# Patient Record
Sex: Female | Born: 1935 | Race: White | Hispanic: No | Smoking: Never smoker
Health system: Southern US, Community
[De-identification: ages and names within clinical notes are randomized; demographics above are authoritative.]

## PROBLEM LIST (undated history)

## (undated) DIAGNOSIS — T7840XA Allergy, unspecified, initial encounter: Secondary | ICD-10-CM

## (undated) DIAGNOSIS — Z951 Presence of aortocoronary bypass graft: Secondary | ICD-10-CM

## (undated) DIAGNOSIS — E785 Hyperlipidemia, unspecified: Secondary | ICD-10-CM

## (undated) DIAGNOSIS — I739 Peripheral vascular disease, unspecified: Secondary | ICD-10-CM

## (undated) DIAGNOSIS — Z789 Other specified health status: Secondary | ICD-10-CM

## (undated) DIAGNOSIS — G629 Polyneuropathy, unspecified: Secondary | ICD-10-CM

## (undated) DIAGNOSIS — I779 Disorder of arteries and arterioles, unspecified: Secondary | ICD-10-CM

## (undated) DIAGNOSIS — I255 Ischemic cardiomyopathy: Secondary | ICD-10-CM

## (undated) DIAGNOSIS — I712 Thoracic aortic aneurysm, without rupture: Secondary | ICD-10-CM

## (undated) DIAGNOSIS — I2510 Atherosclerotic heart disease of native coronary artery without angina pectoris: Secondary | ICD-10-CM

## (undated) DIAGNOSIS — R55 Syncope and collapse: Secondary | ICD-10-CM

## (undated) DIAGNOSIS — Z9889 Other specified postprocedural states: Secondary | ICD-10-CM

## (undated) DIAGNOSIS — I480 Paroxysmal atrial fibrillation: Secondary | ICD-10-CM

## (undated) DIAGNOSIS — F329 Major depressive disorder, single episode, unspecified: Secondary | ICD-10-CM

## (undated) DIAGNOSIS — E781 Pure hyperglyceridemia: Secondary | ICD-10-CM

## (undated) DIAGNOSIS — I10 Essential (primary) hypertension: Secondary | ICD-10-CM

## (undated) DIAGNOSIS — K3184 Gastroparesis: Secondary | ICD-10-CM

## (undated) DIAGNOSIS — Z91041 Radiographic dye allergy status: Secondary | ICD-10-CM

## (undated) DIAGNOSIS — I4729 Other ventricular tachycardia: Secondary | ICD-10-CM

## (undated) DIAGNOSIS — F32A Depression, unspecified: Secondary | ICD-10-CM

## (undated) DIAGNOSIS — I959 Hypotension, unspecified: Secondary | ICD-10-CM

## (undated) DIAGNOSIS — E039 Hypothyroidism, unspecified: Secondary | ICD-10-CM

## (undated) DIAGNOSIS — N189 Chronic kidney disease, unspecified: Secondary | ICD-10-CM

## (undated) DIAGNOSIS — I5022 Chronic systolic (congestive) heart failure: Secondary | ICD-10-CM

## (undated) DIAGNOSIS — I472 Ventricular tachycardia: Secondary | ICD-10-CM

## (undated) HISTORY — PX: OTHER SURGICAL HISTORY: SHX169

## (undated) HISTORY — DX: Hypotension, unspecified: I95.9

## (undated) HISTORY — PX: CARDIAC CATHETERIZATION: SHX172

## (undated) HISTORY — DX: Other specified postprocedural states: Z98.890

## (undated) HISTORY — PX: SALIVARY GLAND SURGERY: SHX768

## (undated) HISTORY — DX: Presence of aortocoronary bypass graft: Z95.1

## (undated) HISTORY — DX: Chronic kidney disease, unspecified: N18.9

## (undated) HISTORY — DX: Gastroparesis: K31.84

## (undated) HISTORY — DX: Major depressive disorder, single episode, unspecified: F32.9

## (undated) HISTORY — DX: Peripheral vascular disease, unspecified: I73.9

## (undated) HISTORY — DX: Essential (primary) hypertension: I10

## (undated) HISTORY — PX: BACK SURGERY: SHX140

## (undated) HISTORY — DX: Radiographic dye allergy status: Z91.041

## (undated) HISTORY — DX: Depression, unspecified: F32.A

## (undated) HISTORY — DX: Syncope and collapse: R55

## (undated) HISTORY — DX: Atherosclerotic heart disease of native coronary artery without angina pectoris: I25.10

## (undated) HISTORY — DX: Disorder of arteries and arterioles, unspecified: I77.9

## (undated) HISTORY — PX: EXTERNAL EAR SURGERY: SHX627

## (undated) HISTORY — PX: BREAST SURGERY: SHX581

## (undated) HISTORY — DX: Other specified health status: Z78.9

## (undated) HISTORY — DX: Hyperlipidemia, unspecified: E78.5

## (undated) HISTORY — PX: COLON RESECTION: SHX5231

## (undated) HISTORY — PX: TOTAL ABDOMINAL HYSTERECTOMY: SHX209

---

## 1998-09-12 ENCOUNTER — Encounter: Payer: Self-pay | Admitting: Neurosurgery

## 1998-09-12 ENCOUNTER — Ambulatory Visit (HOSPITAL_COMMUNITY): Admission: RE | Admit: 1998-09-12 | Discharge: 1998-09-12 | Payer: Self-pay | Admitting: Neurosurgery

## 1999-02-12 ENCOUNTER — Encounter: Payer: Self-pay | Admitting: Emergency Medicine

## 1999-02-12 ENCOUNTER — Inpatient Hospital Stay (HOSPITAL_COMMUNITY): Admission: EM | Admit: 1999-02-12 | Discharge: 1999-02-15 | Payer: Self-pay | Admitting: Emergency Medicine

## 1999-08-28 ENCOUNTER — Encounter: Admission: RE | Admit: 1999-08-28 | Discharge: 1999-08-28 | Payer: Self-pay | Admitting: Family Medicine

## 1999-08-28 ENCOUNTER — Encounter: Payer: Self-pay | Admitting: Family Medicine

## 1999-10-16 ENCOUNTER — Ambulatory Visit (HOSPITAL_BASED_OUTPATIENT_CLINIC_OR_DEPARTMENT_OTHER): Admission: RE | Admit: 1999-10-16 | Discharge: 1999-10-16 | Payer: Self-pay | Admitting: Family Medicine

## 1999-11-12 ENCOUNTER — Encounter: Payer: Self-pay | Admitting: Cardiology

## 1999-11-12 ENCOUNTER — Ambulatory Visit (HOSPITAL_COMMUNITY): Admission: RE | Admit: 1999-11-12 | Discharge: 1999-11-13 | Payer: Self-pay | Admitting: Cardiology

## 2000-02-18 ENCOUNTER — Ambulatory Visit (HOSPITAL_COMMUNITY): Admission: RE | Admit: 2000-02-18 | Discharge: 2000-02-19 | Payer: Self-pay | Admitting: Cardiology

## 2000-02-24 ENCOUNTER — Ambulatory Visit (HOSPITAL_BASED_OUTPATIENT_CLINIC_OR_DEPARTMENT_OTHER): Admission: RE | Admit: 2000-02-24 | Discharge: 2000-02-24 | Payer: Self-pay | Admitting: Family Medicine

## 2000-08-07 ENCOUNTER — Emergency Department (HOSPITAL_COMMUNITY): Admission: EM | Admit: 2000-08-07 | Discharge: 2000-08-07 | Payer: Self-pay | Admitting: Emergency Medicine

## 2000-08-07 ENCOUNTER — Encounter: Payer: Self-pay | Admitting: Internal Medicine

## 2000-08-11 ENCOUNTER — Inpatient Hospital Stay (HOSPITAL_COMMUNITY): Admission: AD | Admit: 2000-08-11 | Discharge: 2000-08-15 | Payer: Self-pay | Admitting: *Deleted

## 2000-09-06 ENCOUNTER — Encounter: Payer: Self-pay | Admitting: Neurosurgery

## 2000-09-06 ENCOUNTER — Ambulatory Visit (HOSPITAL_COMMUNITY): Admission: RE | Admit: 2000-09-06 | Discharge: 2000-09-06 | Payer: Self-pay | Admitting: Neurosurgery

## 2000-09-09 ENCOUNTER — Inpatient Hospital Stay (HOSPITAL_COMMUNITY): Admission: EM | Admit: 2000-09-09 | Discharge: 2000-09-11 | Payer: Self-pay | Admitting: Emergency Medicine

## 2000-11-11 ENCOUNTER — Encounter: Payer: Self-pay | Admitting: Internal Medicine

## 2000-11-11 ENCOUNTER — Inpatient Hospital Stay (HOSPITAL_COMMUNITY): Admission: EM | Admit: 2000-11-11 | Discharge: 2000-11-18 | Payer: Self-pay | Admitting: Emergency Medicine

## 2000-11-28 ENCOUNTER — Emergency Department (HOSPITAL_COMMUNITY): Admission: EM | Admit: 2000-11-28 | Discharge: 2000-11-28 | Payer: Self-pay | Admitting: Emergency Medicine

## 2001-01-20 DIAGNOSIS — Z951 Presence of aortocoronary bypass graft: Secondary | ICD-10-CM

## 2001-01-20 DIAGNOSIS — I7121 Aneurysm of the ascending aorta, without rupture: Secondary | ICD-10-CM

## 2001-01-20 HISTORY — DX: Presence of aortocoronary bypass graft: Z95.1

## 2001-01-20 HISTORY — DX: Aneurysm of the ascending aorta, without rupture: I71.21

## 2001-03-04 ENCOUNTER — Inpatient Hospital Stay (HOSPITAL_COMMUNITY): Admission: AD | Admit: 2001-03-04 | Discharge: 2001-03-06 | Payer: Self-pay | Admitting: Cardiovascular Disease

## 2001-03-27 ENCOUNTER — Inpatient Hospital Stay (HOSPITAL_COMMUNITY): Admission: EM | Admit: 2001-03-27 | Discharge: 2001-04-05 | Payer: Self-pay | Admitting: Cardiology

## 2001-03-29 ENCOUNTER — Encounter: Payer: Self-pay | Admitting: Cardiology

## 2001-04-05 ENCOUNTER — Encounter: Payer: Self-pay | Admitting: Internal Medicine

## 2001-04-25 ENCOUNTER — Inpatient Hospital Stay (HOSPITAL_COMMUNITY): Admission: EM | Admit: 2001-04-25 | Discharge: 2001-05-17 | Payer: Self-pay

## 2001-04-25 ENCOUNTER — Encounter (INDEPENDENT_AMBULATORY_CARE_PROVIDER_SITE_OTHER): Payer: Self-pay | Admitting: Specialist

## 2001-05-04 ENCOUNTER — Encounter: Payer: Self-pay | Admitting: Cardiology

## 2001-05-05 ENCOUNTER — Encounter: Payer: Self-pay | Admitting: Cardiothoracic Surgery

## 2001-05-05 HISTORY — PX: CORONARY ARTERY BYPASS GRAFT: SHX141

## 2001-05-06 ENCOUNTER — Encounter: Payer: Self-pay | Admitting: Cardiothoracic Surgery

## 2001-05-07 ENCOUNTER — Encounter: Payer: Self-pay | Admitting: Cardiothoracic Surgery

## 2001-05-08 ENCOUNTER — Encounter: Payer: Self-pay | Admitting: Cardiothoracic Surgery

## 2001-05-10 ENCOUNTER — Encounter: Payer: Self-pay | Admitting: Thoracic Surgery (Cardiothoracic Vascular Surgery)

## 2001-11-17 ENCOUNTER — Encounter: Payer: Self-pay | Admitting: Cardiology

## 2001-11-17 ENCOUNTER — Encounter: Payer: Self-pay | Admitting: Emergency Medicine

## 2001-11-17 ENCOUNTER — Inpatient Hospital Stay (HOSPITAL_COMMUNITY): Admission: EM | Admit: 2001-11-17 | Discharge: 2001-11-21 | Payer: Self-pay | Admitting: Emergency Medicine

## 2002-01-04 ENCOUNTER — Inpatient Hospital Stay (HOSPITAL_COMMUNITY): Admission: EM | Admit: 2002-01-04 | Discharge: 2002-01-07 | Payer: Self-pay | Admitting: Emergency Medicine

## 2002-04-09 ENCOUNTER — Encounter: Payer: Self-pay | Admitting: Emergency Medicine

## 2002-04-09 ENCOUNTER — Inpatient Hospital Stay (HOSPITAL_COMMUNITY): Admission: EM | Admit: 2002-04-09 | Discharge: 2002-04-15 | Payer: Self-pay | Admitting: Emergency Medicine

## 2002-04-14 ENCOUNTER — Encounter: Payer: Self-pay | Admitting: Cardiology

## 2003-04-23 ENCOUNTER — Inpatient Hospital Stay (HOSPITAL_COMMUNITY): Admission: EM | Admit: 2003-04-23 | Discharge: 2003-04-27 | Payer: Self-pay | Admitting: Emergency Medicine

## 2004-01-11 ENCOUNTER — Ambulatory Visit: Payer: Self-pay | Admitting: Cardiology

## 2004-03-01 ENCOUNTER — Ambulatory Visit: Payer: Self-pay | Admitting: Cardiology

## 2004-03-01 ENCOUNTER — Inpatient Hospital Stay (HOSPITAL_COMMUNITY): Admission: EM | Admit: 2004-03-01 | Discharge: 2004-03-05 | Payer: Self-pay | Admitting: Emergency Medicine

## 2004-03-18 ENCOUNTER — Ambulatory Visit: Payer: Self-pay | Admitting: Cardiology

## 2004-08-05 ENCOUNTER — Ambulatory Visit: Payer: Self-pay | Admitting: Cardiology

## 2005-02-09 ENCOUNTER — Ambulatory Visit: Payer: Self-pay | Admitting: Cardiology

## 2005-02-10 ENCOUNTER — Inpatient Hospital Stay (HOSPITAL_COMMUNITY): Admission: EM | Admit: 2005-02-10 | Discharge: 2005-02-14 | Payer: Self-pay | Admitting: Emergency Medicine

## 2005-02-13 ENCOUNTER — Ambulatory Visit: Payer: Self-pay | Admitting: Gastroenterology

## 2005-02-18 ENCOUNTER — Ambulatory Visit (HOSPITAL_COMMUNITY): Admission: RE | Admit: 2005-02-18 | Discharge: 2005-02-18 | Payer: Self-pay | Admitting: *Deleted

## 2005-08-01 ENCOUNTER — Emergency Department (HOSPITAL_COMMUNITY): Admission: EM | Admit: 2005-08-01 | Discharge: 2005-08-01 | Payer: Self-pay | Admitting: Emergency Medicine

## 2006-01-20 DIAGNOSIS — R55 Syncope and collapse: Secondary | ICD-10-CM

## 2006-01-20 HISTORY — DX: Syncope and collapse: R55

## 2006-02-19 ENCOUNTER — Ambulatory Visit: Payer: Self-pay | Admitting: Cardiology

## 2006-02-19 ENCOUNTER — Encounter: Payer: Self-pay | Admitting: Cardiology

## 2006-02-19 ENCOUNTER — Inpatient Hospital Stay (HOSPITAL_COMMUNITY): Admission: EM | Admit: 2006-02-19 | Discharge: 2006-02-26 | Payer: Self-pay | Admitting: Emergency Medicine

## 2006-02-20 ENCOUNTER — Encounter: Payer: Self-pay | Admitting: Cardiology

## 2006-06-05 ENCOUNTER — Ambulatory Visit: Payer: Self-pay | Admitting: Cardiology

## 2006-06-05 ENCOUNTER — Encounter: Payer: Self-pay | Admitting: Cardiology

## 2006-07-10 ENCOUNTER — Ambulatory Visit: Payer: Self-pay | Admitting: *Deleted

## 2006-07-19 ENCOUNTER — Encounter: Payer: Self-pay | Admitting: Cardiology

## 2006-08-05 ENCOUNTER — Encounter: Payer: Self-pay | Admitting: Cardiology

## 2006-08-05 ENCOUNTER — Ambulatory Visit: Payer: Self-pay | Admitting: Cardiology

## 2006-08-14 ENCOUNTER — Encounter: Payer: Self-pay | Admitting: Cardiology

## 2006-08-14 ENCOUNTER — Ambulatory Visit: Payer: Self-pay | Admitting: Cardiology

## 2006-09-03 ENCOUNTER — Ambulatory Visit: Payer: Self-pay | Admitting: Cardiology

## 2007-05-23 ENCOUNTER — Encounter: Payer: Self-pay | Admitting: Cardiology

## 2007-05-24 ENCOUNTER — Encounter: Payer: Self-pay | Admitting: Cardiology

## 2007-07-07 ENCOUNTER — Encounter: Payer: Self-pay | Admitting: Cardiology

## 2007-07-08 ENCOUNTER — Encounter: Payer: Self-pay | Admitting: Cardiology

## 2007-09-03 ENCOUNTER — Encounter: Payer: Self-pay | Admitting: Cardiology

## 2007-09-09 ENCOUNTER — Ambulatory Visit: Payer: Self-pay | Admitting: Cardiology

## 2007-10-10 ENCOUNTER — Encounter: Payer: Self-pay | Admitting: Cardiology

## 2008-06-26 ENCOUNTER — Encounter: Payer: Self-pay | Admitting: Cardiology

## 2008-08-04 ENCOUNTER — Encounter: Payer: Self-pay | Admitting: Cardiology

## 2008-09-06 ENCOUNTER — Encounter: Payer: Self-pay | Admitting: Cardiology

## 2008-09-06 ENCOUNTER — Ambulatory Visit: Payer: Self-pay | Admitting: Cardiology

## 2008-09-07 ENCOUNTER — Encounter: Payer: Self-pay | Admitting: Cardiology

## 2008-09-29 ENCOUNTER — Telehealth (INDEPENDENT_AMBULATORY_CARE_PROVIDER_SITE_OTHER): Payer: Self-pay | Admitting: *Deleted

## 2009-01-16 ENCOUNTER — Encounter: Payer: Self-pay | Admitting: Cardiology

## 2009-01-23 ENCOUNTER — Inpatient Hospital Stay (HOSPITAL_COMMUNITY): Admission: EM | Admit: 2009-01-23 | Discharge: 2009-01-24 | Payer: Self-pay | Admitting: Emergency Medicine

## 2009-01-24 ENCOUNTER — Encounter: Payer: Self-pay | Admitting: Cardiology

## 2009-01-24 ENCOUNTER — Ambulatory Visit: Payer: Self-pay | Admitting: Cardiology

## 2009-01-29 ENCOUNTER — Encounter: Payer: Self-pay | Admitting: Cardiology

## 2009-02-01 ENCOUNTER — Encounter: Payer: Self-pay | Admitting: Cardiology

## 2009-02-05 ENCOUNTER — Encounter: Payer: Self-pay | Admitting: Cardiology

## 2009-03-14 ENCOUNTER — Encounter: Payer: Self-pay | Admitting: Cardiology

## 2009-03-15 ENCOUNTER — Ambulatory Visit: Payer: Self-pay | Admitting: Cardiology

## 2009-08-06 ENCOUNTER — Telehealth (INDEPENDENT_AMBULATORY_CARE_PROVIDER_SITE_OTHER): Payer: Self-pay | Admitting: *Deleted

## 2009-08-19 ENCOUNTER — Ambulatory Visit: Payer: Self-pay | Admitting: Internal Medicine

## 2009-08-19 ENCOUNTER — Encounter: Payer: Self-pay | Admitting: Cardiology

## 2009-08-20 ENCOUNTER — Inpatient Hospital Stay (HOSPITAL_COMMUNITY): Admission: EM | Admit: 2009-08-20 | Discharge: 2009-08-23 | Payer: Self-pay | Admitting: Emergency Medicine

## 2009-08-20 ENCOUNTER — Encounter: Payer: Self-pay | Admitting: Cardiology

## 2009-08-21 ENCOUNTER — Encounter: Payer: Self-pay | Admitting: Cardiology

## 2009-08-21 DIAGNOSIS — I2510 Atherosclerotic heart disease of native coronary artery without angina pectoris: Secondary | ICD-10-CM | POA: Insufficient documentation

## 2009-08-22 ENCOUNTER — Encounter: Payer: Self-pay | Admitting: Cardiology

## 2009-08-23 ENCOUNTER — Encounter: Payer: Self-pay | Admitting: Cardiology

## 2009-08-27 ENCOUNTER — Ambulatory Visit: Payer: Self-pay | Admitting: Cardiology

## 2009-12-19 ENCOUNTER — Telehealth (INDEPENDENT_AMBULATORY_CARE_PROVIDER_SITE_OTHER): Payer: Self-pay | Admitting: *Deleted

## 2010-02-21 NOTE — Assessment & Plan Note (Signed)
Summary: 6 MO F/U PER REMINDER-DR. KATZ PT-  Medications Added ISOSORBIDE MONONITRATE CR 30 MG XR24H-TAB (ISOSORBIDE MONONITRATE) take 2 tabs (60mg ) daily VITAMIN B-12 500 MCG TABS (CYANOCOBALAMIN) Take 1 tablet by mouth once a day FOLIC ACID 1 MG TABS (FOLIC ACID) Take 1 tablet by mouth once a day MELATONIN 5 MG TABS (MELATONIN) take 2 tabs at bedtime STOOL SOFTENER 100 MG CAPS (DOCUSATE SODIUM) 2 tabs daily as needed AMARYL 2 MG TABS (GLIMEPIRIDE) Take 1 tab by mouth at bedtime ATENOLOL 100 MG TABS (ATENOLOL) Take 1 tablet by mouth once a day METFORMIN HCL 500 MG TABS (METFORMIN HCL) Take 1 tablet by mouth two times a day        Visit Type:  Follow-up Primary Provider:  Isac Sarna, MD  CC:  CAD.  History of Present Illness: The patient presents for followup after recent cardiac catheterization and admission for chest pain. She was found to have coronary disease as described below. She status post bypass grafting in stenting in the past. Her anatomy was felt to be stable and she was managed medically. Since catheterization she has had a couple of episodes of chest discomfort but these apparently have been brief not requiring nitroglycerin. She is very limited in her activities with apparent generalized weakness and also balance problems related to vertigo. She has not had any severe chest pressure, neck or arm discomfort. She is not describing new palpitations, presyncope or syncope. She doesn't describe shortness of breath, PND or orthopnea.  Preventive Screening-Counseling & Management  Alcohol-Tobacco     Smoking Status: never  Current Medications (verified): 1)  Isosorbide Mononitrate Cr 30 Mg Xr24h-Tab (Isosorbide Mononitrate) .... Take 2 Tabs (60mg ) Daily 2)  Plavix 75 Mg Tabs (Clopidogrel Bisulfate) .... Take One Tablet By Mouth Daily 3)  Gemfibrozil 600 Mg Tabs (Gemfibrozil) .... Take One Tablet By Mouth Once Daily 4)  Multivitamins   Tabs (Multiple Vitamin) .... Once  Daily 5)  Nitroglycerin 0.4 Mg Subl (Nitroglycerin) .... One Tablet Under Tongue Every 5 Minutes As Needed For Chest Pain---May Repeat Times Three 6)  Levothyroxine Sodium 112 Mcg Tabs (Levothyroxine Sodium) .... 1/2 Tab Daily 7)  Lantus 100 Unit/ml Soln (Insulin Glargine) .... 90 Units Two Times A Day 8)  Ramipril 5 Mg Caps (Ramipril) .... Take 1 Tablet By Mouth Once A Day 9)  Humalog 100 Unit/ml Soln (Insulin Lispro (Human)) .... Ss Once Daily 10)  Citalopram Hydrobromide 20 Mg Tabs (Citalopram Hydrobromide) .... Take 1 Tablet By Mouth Once A Day 11)  Vitamin B-12 500 Mcg Tabs (Cyanocobalamin) .... Take 1 Tablet By Mouth Once A Day 12)  Aspirin 325 Mg Tabs (Aspirin) .... Take 1 Tablet By Mouth Once A Day 13)  Folic Acid 1 Mg Tabs (Folic Acid) .... Take 1 Tablet By Mouth Once A Day 14)  Tums 500 Mg Chew (Calcium Carbonate Antacid) .... As Needed 15)  One Touch Ultra Test .... Once Daily 16)  Amlodipine Besylate 5 Mg Tabs (Amlodipine Besylate) .... Take 1 Tablet By Mouth Once A Day 17)  Melatonin 5 Mg Tabs (Melatonin) .... Take 2 Tabs At Bedtime 18)  Meclizine Hcl 25 Mg Tabs (Meclizine Hcl) .... As Needed 19)  Gabapentin 300 Mg Caps (Gabapentin) .... Take 1 Tab By Mouth At Bedtime 20)  Tylenol Arthritis Pain 650 Mg Cr-Tabs (Acetaminophen) .... As Needed 21)  Stool Softener 100 Mg Caps (Docusate Sodium) .... 2 Tabs Daily As Needed 22)  Amaryl 2 Mg Tabs (Glimepiride) .... Take 1 Tab By  Mouth At Bedtime 23)  Atenolol 100 Mg Tabs (Atenolol) .... Take 1 Tablet By Mouth Once A Day 24)  Metformin Hcl 500 Mg Tabs (Metformin Hcl) .... Take 1 Tablet By Mouth Two Times A Day  Allergies: 1)  ! Lipitor 2)  ! Iodine 3)  ! * Ambien 4)  ! * Nitrofurantoin  Past History:  Past Medical History: HYPERTENSION, UNSPECIFIED (ICD-401.9) HYPERLIPIDEMIA-MIXED (ICD-272.4) CAD...(catheterization 08/21/2009, LAD distal diffuse stenosis, circumflex occluded fills via collaterals, LOM with patent stent followed by  nonobstructive plaque, right coronary artery occluded, saphenous vein graft to the right coronary artery patent, LIMA to the LAD patent. SVG to OM occluded. EF preserved.) CABG 2003... LIMA.. LAD, SVG to the OM, SVG right coronary artery Ascending thoracic aneurysm repair with a graft at time of CABG diabetes........ insulin-dependent Gastroparesis Depression Contrast dye allergy Ejection fraction 50% Back surgery Bladder tack Hysterectomy Breast surgery Syncope.Marland KitchenMarland Kitchen??? question 2008 Statin... intolerance...  Review of Systems       As stated in the HPI and negative for all other systems.   Vital Signs:  Patient profile:   77 year old female Height:      65 inches Weight:      197.50 pounds BMI:     32.98 Pulse rate:   80 / minute BP sitting:   106 / 67  (left arm) Cuff size:   regular  Vitals Entered By: Hoover Brunette, LPN (August 27, 2009 3:01 PM)  Nutrition Counseling: Patient's BMI is greater than 25 and therefore counseled on weight management options. CC: CAD Is Patient Diabetic? Yes Comments post hosp & 6 mo. f/u   Physical Exam  General:  Frail appearing with poor hygiene Head:  normocephalic and atraumatic Eyes:  PERRLA/EOM intact; conjunctiva and lids normal. Mouth:  Teeth, gums and palate normal. Oral mucosa normal. Neck:  Neck supple, no JVD. No masses, thyromegaly or abnormal cervical nodes. Chest Wall:  Well-healed sternotomy scar Lungs:  Clear bilaterally to auscultation and percussion. Heart:  S1 and S2 within normal limits, no S3, no S4, no clicks, no rubs, no murmurs Abdomen:  Bowel sounds positive; abdomen soft and non-tender without masses, organomegaly, or hernias noted. No hepatosplenomegaly. Msk:  mild diffuse muscle weakness Extremities:  mildly diminished pulses in the left lower extremity otherwise 4+ pulses throughout, no edema, no cyanosis, no clubbing Neurologic:  Diffuse motor weakness, cranial nerves grossly intact Skin:  Intact without  lesions or rashes. Cervical Nodes:  no significant adenopathy Psych:  Normal affect.   Impression & Recommendations:  Problem # 1:  CAD (ICD-414.00) The patient has disease as described. No further cardiovascular testing is suggested. She will continue with risk reduction.  Problem # 2:  HYPERLIPIDEMIA-MIXED (ICD-272.4) She had blood drawn today. She was intolerant of Lipitor. Perhaps she would tolerate pravastatin. I will defer to her primary physician with a goal LDL less than 70 and HDL greater then 50. Her updated medication list for this problem includes:    Gemfibrozil 600 Mg Tabs (Gemfibrozil) .[name]... Take one tablet by mouth once daily  Problem # 3:  HYPERTENSION, UNSPECIFIED (ICD-401.9) Her blood pressure is well controlled. She will continue the meds as listed.  Patient Instructions: 1)  Your physician wants you to follow-up in: 6 months. You will receive a reminder letter in the mail one-two months in advance. If you don't receive a letter, please call our office to schedule the follow-up appointment. 2)  Your physician recommends that you continue on your current medications as  directed. Please refer to the Current Medication list given to you today.

## 2010-02-21 NOTE — Consult Note (Signed)
Summary: CARDIOLOGY CONSULT/ [facility]  CARDIOLOGY CONSULT/ [facility]   Imported By: Zachary George 03/15/2009 15:26:29  _____________________________________________________________________  External Attachment:    Type:   Image     Comment:   External Document

## 2010-02-21 NOTE — Miscellaneous (Signed)
  Clinical Lists Changes  Problems: Removed problem of CAD, ARTERY BYPASS GRAFT (ICD-414.04) Added new problem of CORONARY ARTERY BYPASS GRAFT, HX OF (ICD-V45.81) Added new problem of CAD (ICD-414.00) Added new problem of CAROTID ARTERY DISEASE (ICD-433.10) Added new problem of * ASCENDING THORACIC ANEURYSM Added new problem of DM (ICD-250.00) Added new problem of * CONTRAST DYE ALLERGY Observations: Added new observation of PAST MED HX: HYPERTENSION, UNSPECIFIED (ICD-401.9) HYPERLIPIDEMIA-MIXED (ICD-272.4) CAD...catheterization 2006.[name] occluded OM vein graft   /   ..nuclear stress 2008.. question slight ischemia CABG 2003... LIMA.. LAD, SVG to the OM, SVG right coronary artery Ascending thoracic aneurysm repair with a graft at time of CABG diabetes........ insulin-dependent Gastroparesis Depression Contrast dye allergy Ejection fraction 50% back surgery Bladder tack Hysterectomy Breast surgery Syncope.Marland KitchenMarland Kitchen??? question 2008 Carotid artery disease....endarterectomy in the past (03/14/2009 14:46)       Past History:  Past Medical History: HYPERTENSION, UNSPECIFIED (ICD-401.9) HYPERLIPIDEMIA-MIXED (ICD-272.4) CAD...catheterization 2006.[name] occluded OM vein graft   /   ..nuclear stress 2008.. question slight ischemia CABG 2003... LIMA.. LAD, SVG to the OM, SVG right coronary artery Ascending thoracic aneurysm repair with a graft at time of CABG diabetes........ insulin-dependent Gastroparesis Depression Contrast dye allergy Ejection fraction 50% back surgery Bladder tack Hysterectomy Breast surgery Syncope.Marland KitchenMarland Kitchen??? question 2008 Carotid artery disease....endarterectomy in the past

## 2010-02-21 NOTE — Procedures (Signed)
Summary: Holter and Event/ [facility] END OF SERVICE SUMMARY REPORT  Holter and Event/ [facility] END OF SERVICE SUMMARY REPORT   Imported By: Dorise Hiss 03/14/2009 14:36:43  _____________________________________________________________________  External Attachment:    Type:   Image     Comment:   External Document

## 2010-02-21 NOTE — Assessment & Plan Note (Signed)
Summary: PER DR. Tevion Laforge'S NEEDS 1 MONTH POST FU  Medications Added LEVOTHYROXINE SODIUM 112 MCG TABS (LEVOTHYROXINE SODIUM) 1/2 tab daily LANTUS 100 UNIT/ML SOLN (INSULIN GLARGINE) 90 units two times a day RAMIPRIL 5 MG CAPS (RAMIPRIL) Take 1 tablet by mouth once a day CITALOPRAM HYDROBROMIDE 20 MG TABS (CITALOPRAM HYDROBROMIDE) Take 1 tablet by mouth once a day ASPIRIN 325 MG TABS (ASPIRIN) Take 1 tablet by mouth once a day FOLIC ACID 800 MCG TABS (FOLIC ACID) Take 1 tablet by mouth once a day AMLODIPINE BESYLATE 5 MG TABS (AMLODIPINE BESYLATE) Take 1 tablet by mouth once a day GABAPENTIN 300 MG CAPS (GABAPENTIN) Take 1 tab by mouth at bedtime TYLENOL ARTHRITIS PAIN 650 MG CR-TABS (ACETAMINOPHEN) as needed STOOL SOFTENER 100 MG CAPS (DOCUSATE SODIUM) 2 tabs daily        Visit Type:  Follow-up Primary Provider:  Isac Sarna, MD  CC:  CAD.  History of Present Illness: The patient has coronary artery disease.  She underwent CABG in 2003.  She also had an ascending thoracic aortic aneurysm repaired at that time with a graft.  Catheterization was done in 2006 showing an occluded OM vein graft.  Nuclear stress was done in 2008 and there was question of slight ischemia.  She is not having any significant chest pain at this time.  Preventive Screening-Counseling & Management  Alcohol-Tobacco     Smoking Status: never  Current Medications (verified): 1)  Isosorbide Mononitrate Cr 30 Mg Xr24h-Tab (Isosorbide Mononitrate) .... Take One Tablet By Mouth Daily 2)  Plavix 75 Mg Tabs (Clopidogrel Bisulfate) .... Take One Tablet By Mouth Daily 3)  Gemfibrozil 600 Mg Tabs (Gemfibrozil) .... Take One Tablet By Mouth Once Daily 4)  Multivitamins   Tabs (Multiple Vitamin) .... Once Daily 5)  Nitroglycerin 0.4 Mg Subl (Nitroglycerin) .... One Tablet Under Tongue Every 5 Minutes As Needed For Chest Pain---May Repeat Times Three 6)  Levothyroxine Sodium 112 Mcg Tabs (Levothyroxine Sodium) .... 1/2 Tab  Daily 7)  Lantus 100 Unit/ml Soln (Insulin Glargine) .... 90 Units Two Times A Day 8)  Ramipril 5 Mg Caps (Ramipril) .... Take 1 Tablet By Mouth Once A Day 9)  Humalog 100 Unit/ml Soln (Insulin Lispro (Human)) .... Ss Once Daily 10)  Citalopram Hydrobromide 20 Mg Tabs (Citalopram Hydrobromide) .... Take 1 Tablet By Mouth Once A Day 11)  Vitamin B-12 1000 Mcg Tabs (Cyanocobalamin) .... Once Daily 12)  Aspirin 325 Mg Tabs (Aspirin) .... Take 1 Tablet By Mouth Once A Day 13)  Folic Acid 800 Mcg Tabs (Folic Acid) .... Take 1 Tablet By Mouth Once A Day 14)  Tums 500 Mg Chew (Calcium Carbonate Antacid) .... As Needed 15)  One Touch Ultra Test .... Once Daily 16)  Amlodipine Besylate 5 Mg Tabs (Amlodipine Besylate) .... Take 1 Tablet By Mouth Once A Day 17)  Melatonin 5 Mg Tabs (Melatonin) .... At Bedtime 18)  Meclizine Hcl 25 Mg Tabs (Meclizine Hcl) .... As Needed 19)  Gabapentin 300 Mg Caps (Gabapentin) .... Take 1 Tab By Mouth At Bedtime 20)  Tylenol Arthritis Pain 650 Mg Cr-Tabs (Acetaminophen) .... As Needed 21)  Stool Softener 100 Mg Caps (Docusate Sodium) .... 2 Tabs Daily  Allergies: 1)  ! Lipitor 2)  ! Iodine 3)  ! * Ambien  Past History:  Past Medical History: HYPERTENSION, UNSPECIFIED (ICD-401.9) HYPERLIPIDEMIA-MIXED (ICD-272.4) CAD...catheterization 2006.[name] occluded OM vein graft   /   ..nuclear stress 2008.. question slight ischemia CABG 2003... LIMA.. LAD, SVG to the  OM, SVG right coronary artery Ascending thoracic aneurysm repair with a graft at time of CABG diabetes........ insulin-dependent Gastroparesis Depression Contrast dye allergy Ejection fraction 50% back surgery Bladder tack Hysterectomy Breast surgery Syncope.Marland KitchenMarland Kitchen??? question 2008 Statin... intolerance...  Social History: Smoking Status:  never  Review of Systems       Patient denies fever, chills, headache, sweats, rash, change in vision, change in hearing, chest pain, cough, shortness of breath, nausea  vomiting, urinary symptoms.  All other systems are reviewed and are negative.  Vital Signs:  Patient profile:   77 year old female Height:      65 inches Weight:      192.50 pounds Pulse rate:   80 / minute BP sitting:   122 / 73  (left arm) Cuff size:   regular  Vitals Entered By: Hoover Brunette, LPN (March 15, 2009 4:10 PM) CC: CAD Is Patient Diabetic? Yes Comments hosp. f/u   Physical Exam  General:  The patient is stable today. Eyes:  no xanthelasma. Neck:  no jugular venous distention. Lungs:  lungs are clear.  Respiratory effort is not labored. Heart:  cardiac exam reveals S1-S2.  There no clicks or significant murmurs. Abdomen:  abdomen is soft. Extremities:  no peripheral edema. Psych:  patient is oriented to person time and place.  Affect is normal.   Impression & Recommendations:  Problem # 1:  CAD (ICD-414.00)  The following medications were removed from the medication list:    Atenolol 100 Mg Tabs (Atenolol) .[name]... Take one tablet by mouth daily Her updated medication list for this problem includes:    Isosorbide Mononitrate Cr 30 Mg Xr24h-tab (Isosorbide mononitrate) .[name]... Take one tablet by mouth daily    Plavix 75 Mg Tabs (Clopidogrel bisulfate) .[name]... Take one tablet by mouth daily    Nitroglycerin 0.4 Mg Subl (Nitroglycerin) ..... One tablet under tongue every 5 minutes as needed for chest pain---may repeat times three    Ramipril 5 Mg Caps (Ramipril) .[name]... Take 1 tablet by mouth once a day    Aspirin 325 Mg Tabs (Aspirin) .[name]... Take 1 tablet by mouth once a day    Amlodipine Besylate 5 Mg Tabs (Amlodipine besylate) .[name]... Take 1 tablet by mouth once a day  Coronary disease is stable.  No labs are needed at this time.  We'll see her in 6 months for followup.  Problem # 2:  HYPERTENSION, UNSPECIFIED (ICD-401.9)  The following medications were removed from the medication list:    Torsemide 20 Mg Tabs (Torsemide) ..... Once daily    Atenolol 100 Mg Tabs  (Atenolol) .[name]... Take one tablet by mouth daily Her updated medication list for this problem includes:    Ramipril 5 Mg Caps (Ramipril) .[name]... Take 1 tablet by mouth once a day    Aspirin 325 Mg Tabs (Aspirin) .[name]... Take 1 tablet by mouth once a day    Amlodipine Besylate 5 Mg Tabs (Amlodipine besylate) .[name]... Take 1 tablet by mouth once a day  Blood pressure is under good control.  No change in therapy.  Problem # 3:  * STATIN INTOLERANCE Unfortunately the patient is intolerant to statins.  It would be optimal to have her on one but she had a bad reaction to Lipitor in the past.  Patient Instructions: 1)  Your physician wants you to follow-up in: 6 months. You will receive a reminder letter in the mail one-two months in advance. If you don't receive a letter, please call our office to schedule  the follow-up appointment. 2)  Your physician recommends that you continue on your current medications as directed. Please refer to the Current Medication list given to you today.

## 2010-02-21 NOTE — Progress Notes (Signed)
Summary: return call    - plavix patient assistance   Phone Note Call from Patient Call back at Home Phone [phone]   Caller: Patient Reason for Call: Talk to Nurse Summary of Call: patient returned call Initial call taken by: Claudette Laws,  December 19, 2009 2:53 PM  Follow-up for Phone Call        Left message to return call.  Hoover Brunette, LPN  December 20, 2009 10:19 AM  Left message to return call.  Hoover Brunette, LPN  January 04, 2010 10:43 AM   Additional Follow-up for Phone Call Additional follow up Details #1::        Received call on voicemail - getting ready to have colonoscopy and have to be off anyway.  States she will call back after first of the year to get this going again.  Above in reference to Plavix patient assistance program. Additional Follow-up by: Hoover Brunette, LPN,  January 09, 2010 2:44 PM

## 2010-02-21 NOTE — Letter (Signed)
Summary: [facility] D/C DR Doyne Keel  [facility] D/C DR PARSONS   Imported By: Zachary George 03/15/2009 15:20:14  _____________________________________________________________________  External Attachment:    Type:   Image     Comment:   External Document

## 2010-02-21 NOTE — Progress Notes (Signed)
Summary: Heart Racing   Phone Note Call from Patient Call back at [facility] Phone [phone]   Summary of Call: Pt called stating she has reminder for August appt with Dr. Myrtis Ser. She states her primary MD would like her seen in the office for evaluation of pulse racing. She states her HR has been running 85-116 and she has been dizzy and tired. Pt notified Dr. Myrtis Ser has no schedule available until after Sept. Pt refused to see Gene. Pt scheduled with Dr. Antoine Poche on 8/8. Initial call taken by: Cyril Loosen, RN, BSN,  August 06, 2009 2:39 PM

## 2010-04-05 LAB — BASIC METABOLIC PANEL
Calcium: 9.2 mg/dL (ref 8.4–10.5)
Creatinine, Ser: 0.88 mg/dL (ref 0.4–1.2)
GFR calc non Af Amer: >60 mL/min (ref >60)
Glucose, Bld: 206 mg/dL — ABNORMAL HIGH (ref 70–99)
Sodium: 140 mEq/L (ref 135–145)

## 2010-04-05 LAB — GLUCOSE, CAPILLARY
Glucose-Capillary: 207 mg/dL — ABNORMAL HIGH (ref 70–99)
Glucose-Capillary: 258 mg/dL — ABNORMAL HIGH (ref 70–99)
Glucose-Capillary: 308 mg/dL — ABNORMAL HIGH (ref 70–99)
Glucose-Capillary: 328 mg/dL — ABNORMAL HIGH (ref 70–99)
Glucose-Capillary: 331 mg/dL — ABNORMAL HIGH (ref 70–99)
Glucose-Capillary: 348 mg/dL — ABNORMAL HIGH (ref 70–99)
Glucose-Capillary: 356 mg/dL — ABNORMAL HIGH (ref 70–99)
Glucose-Capillary: 375 mg/dL — ABNORMAL HIGH (ref 70–99)
Glucose-Capillary: 388 mg/dL — ABNORMAL HIGH (ref 70–99)
Glucose-Capillary: 443 mg/dL — ABNORMAL HIGH (ref 70–99)
Glucose-Capillary: 462 mg/dL — ABNORMAL HIGH (ref 70–99)
Glucose-Capillary: 73 mg/dL (ref 70–99)

## 2010-04-05 LAB — CARDIAC PANEL(CRET KIN+CKTOT+MB+TROPI)
CK, MB: 2 ng/mL (ref 0.3–4.0)
Relative Index: INVALID (ref 0.0–2.5)
Total CK: 72 U/L (ref 7–177)

## 2010-04-05 LAB — CBC
HCT: 41.7 % (ref 36.0–46.0)
Hemoglobin: 13.7 g/dL (ref 12.0–15.0)
MCH: 28.7 pg (ref 26.0–34.0)
MCHC: 32.9 g/dL (ref 30.0–36.0)
MCV: 87.6 fL (ref 78.0–100.0)
MCV: 87.8 fL (ref 78.0–100.0)
Platelets: 229 10*3/uL (ref 150–400)
Platelets: 237 10*3/uL (ref 150–400)
Platelets: 238 10*3/uL (ref 150–400)
RBC: 4.36 MIL/uL (ref 3.87–5.11)
RBC: 4.75 MIL/uL (ref 3.87–5.11)
RDW: 14 % (ref 11.5–15.5)
RDW: 14 % (ref 11.5–15.5)
RDW: 14.3 % (ref 11.5–15.5)
RDW: 14.4 % (ref 11.5–15.5)
WBC: 8.9 10*3/uL (ref 4.0–10.5)
WBC: 9.9 10*3/uL (ref 4.0–10.5)

## 2010-04-05 LAB — COMPREHENSIVE METABOLIC PANEL
ALT: 15 U/L (ref 0–35)
Alkaline Phosphatase: 71 U/L (ref 39–117)
CO2: 25 mEq/L (ref 19–32)
Chloride: 110 mEq/L (ref 96–112)
Glucose, Bld: 75 mg/dL (ref 70–99)
Potassium: 3.7 mEq/L (ref 3.5–5.1)
Sodium: 142 mEq/L (ref 135–145)
Total Bilirubin: 0.4 mg/dL (ref 0.3–1.2)
Total Protein: 6.7 g/dL (ref 6.0–8.3)

## 2010-04-05 LAB — HEPARIN LEVEL (UNFRACTIONATED): Heparin Unfractionated: 0.15 IU/mL — ABNORMAL LOW (ref 0.30–0.70)

## 2010-04-05 LAB — LIPID PANEL
Cholesterol: 168 mg/dL (ref 0–200)
Total CHOL/HDL Ratio: 4.9 RATIO

## 2010-04-05 LAB — PROTIME-INR: INR: 1.09 (ref 0.00–1.49)

## 2010-04-05 LAB — CK TOTAL AND CKMB (NOT AT ARMC): Relative Index: INVALID (ref 0.0–2.5)

## 2010-04-05 LAB — HEMOGLOBIN A1C: Mean Plasma Glucose: 180 mg/dL — ABNORMAL HIGH (ref <117)

## 2010-04-06 LAB — POCT I-STAT, CHEM 8
Calcium, Ion: 1.14 mmol/L (ref 1.12–1.32)
Hemoglobin: 14.6 g/dL (ref 12.0–15.0)
Sodium: 139 mEq/L (ref 135–145)
TCO2: 24 mmol/L (ref 0–100)

## 2010-04-06 LAB — CBC
MCH: 30.1 pg (ref 26.0–34.0)
MCHC: 34.4 g/dL (ref 30.0–36.0)
MCV: 87.5 fL (ref 78.0–100.0)
Platelets: 262 10*3/uL (ref 150–400)
RDW: 14.4 % (ref 11.5–15.5)
WBC: 12.4 10*3/uL — ABNORMAL HIGH (ref 4.0–10.5)

## 2010-04-06 LAB — POCT CARDIAC MARKERS: Myoglobin, poc: 91.8 ng/mL (ref 12–200)

## 2010-04-06 LAB — DIFFERENTIAL
Basophils Absolute: 0.1 10*3/uL (ref 0.0–0.1)
Eosinophils Absolute: 0.2 10*3/uL (ref 0.0–0.7)
Eosinophils Relative: 2 % (ref 0–5)

## 2010-04-07 LAB — GLUCOSE, CAPILLARY
Glucose-Capillary: 141 mg/dL — ABNORMAL HIGH (ref 70–99)
Glucose-Capillary: 144 mg/dL — ABNORMAL HIGH (ref 70–99)
Glucose-Capillary: 158 mg/dL — ABNORMAL HIGH (ref 70–99)
Glucose-Capillary: 171 mg/dL — ABNORMAL HIGH (ref 70–99)

## 2010-04-07 LAB — DIFFERENTIAL
Basophils Absolute: 0 10*3/uL (ref 0.0–0.1)
Basophils Relative: 0 % (ref 0–1)
Basophils Relative: 0 % (ref 0–1)
Eosinophils Absolute: 0 10*3/uL (ref 0.0–0.7)
Eosinophils Absolute: 0.1 10*3/uL (ref 0.0–0.7)
Monocytes Absolute: 1 10*3/uL (ref 0.1–1.0)
Monocytes Relative: 7 % (ref 3–12)
Monocytes Relative: 7 % (ref 3–12)
Neutro Abs: 8.6 10*3/uL — ABNORMAL HIGH (ref 1.7–7.7)
Neutro Abs: 9.1 10*3/uL — ABNORMAL HIGH (ref 1.7–7.7)
Neutrophils Relative %: 68 % (ref 43–77)

## 2010-04-07 LAB — URINALYSIS, ROUTINE W REFLEX MICROSCOPIC
Bilirubin Urine: NEGATIVE
Ketones, ur: NEGATIVE mg/dL
Nitrite: NEGATIVE
Specific Gravity, Urine: 1.02 (ref 1.005–1.030)
Urobilinogen, UA: 0.2 mg/dL (ref 0.0–1.0)

## 2010-04-07 LAB — CBC
Hemoglobin: 14.9 g/dL (ref 12.0–15.0)
MCHC: 33.2 g/dL (ref 30.0–36.0)
MCHC: 33.3 g/dL (ref 30.0–36.0)
MCV: 86.3 fL (ref 78.0–100.0)
MCV: 87.2 fL (ref 78.0–100.0)
Platelets: 221 10*3/uL (ref 150–400)
RBC: 5.08 MIL/uL (ref 3.87–5.11)
RDW: 15.2 % (ref 11.5–15.5)
WBC: 12.7 10*3/uL — ABNORMAL HIGH (ref 4.0–10.5)

## 2010-04-07 LAB — BASIC METABOLIC PANEL
BUN: 19 mg/dL (ref 6–23)
CO2: 26 mEq/L (ref 19–32)
CO2: 28 mEq/L (ref 19–32)
Calcium: 10.3 mg/dL (ref 8.4–10.5)
Calcium: 9.8 mg/dL (ref 8.4–10.5)
Chloride: 102 mEq/L (ref 96–112)
Creatinine, Ser: 0.78 mg/dL (ref 0.4–1.2)
Creatinine, Ser: 0.82 mg/dL (ref 0.4–1.2)
GFR calc Af Amer: >60 mL/min (ref >60)
Glucose, Bld: 190 mg/dL — ABNORMAL HIGH (ref 70–99)
Sodium: 138 mEq/L (ref 135–145)

## 2010-04-07 LAB — MAGNESIUM: Magnesium: 2.1 mg/dL (ref 1.5–2.5)

## 2010-04-07 LAB — PHOSPHORUS: Phosphorus: 3.9 mg/dL (ref 2.3–4.6)

## 2010-04-07 LAB — ALBUMIN: Albumin: 3.2 g/dL — ABNORMAL LOW (ref 3.5–5.2)

## 2010-04-07 LAB — URINE MICROSCOPIC-ADD ON

## 2010-05-23 ENCOUNTER — Ambulatory Visit: Payer: Self-pay | Admitting: Cardiology

## 2010-05-30 ENCOUNTER — Ambulatory Visit: Payer: Self-pay | Admitting: Cardiology

## 2010-06-04 NOTE — Assessment & Plan Note (Signed)
[facility]                          [facility] OFFICE NOTE   JEILANI, GRUPE                       MRN:          [id number]  DATE:09/03/2006                            DOB:          1930/11/19    [name] is seen for cardiology follow up.  Dr. [name] has been her  cardiologist in [place] over time; and we will follow her here.  I  saw her in consultation on August 06, 2006.  She had had some syncope; and  then we saw her in the hospital with some chest pain.  The patient has  known coronary disease.  Her last catheterization was in February 2006  and two of her three grafts were patent.  At the that time, we decided  to schedule an outpatient Cardiolite and to follow up with Dr. Myrtis Ser.  She had an echo in May of 2008.  At that time there was  inferior/posterior hypokinesis with an ejection fraction in the 45%  range.  She had her outpatient nuclear scan.  This was done on August 14, 2006.  She was stressed with walking.  There were no diagnostic EKG  changes.  The study showed that she had an old inferolateral scar with  the possibility of some peri-infarct ischemia.  There was also a small  anteroseptal defect that did not reverse.  Since that time she has been  stable.  She is now back for follow up.  Her meds have been adjusted  while she was in the hospital and she feels better.  She is not having  any significant ongoing chest discomfort.   PAST MEDICAL HISTORY/ALLERGIES:  GLUCOPHAGE LIPITOR, ACTOS, CONTRAST  DYE.   MEDICATIONS CURRENTLY:  1. She is on Depakote 500 at night.  2. Torsemide 20 daily.  3. Synthroid one half of a 112 mcg tablet.  4. Lantus.  5. Atenolol 100.  6. Amaryl 4.  7. Altace 5.  8. Humalog.  9. Celexa.  10.Aspirin.  11.Folic acid.  12.Protonix.  13.Imdur 30 at night.  14.Norvasc 4.  15.Lorazepam 1.  16.Plavix 75.  17.Gemfibrozil 600.  18.Multivitamin.   OTHER MEDICAL PROBLEMS:  See the list  below.   REVIEW OF SYSTEMS:  Today, she is actually feeling well.  She lost the  cap on one of her teeth and is headed to the dentist as soon as we are  finished.  Otherwise her review of systems is negative.   PHYSICAL EXAM:  Blood pressure in the office at the moment as 160/79.  The patient has a listing of her blood pressures taken very carefully at  home, and they tend to run in the 130-135 systolic range.  Her pulse is  77.  Weight is 205 pounds.  The patient is oriented to person time and place.  Affect is normal.  HEENT:  Reveals no xanthelasma.  She has normal extraocular motions.  There are no carotid bruits.  There is no jugular venous distention.  CARDIAC EXAM:  Reveals an S1 with an S2.  There are no clicks or  significant murmurs.  ABDOMEN:  Soft.  There are no masses or bruits.  She has normal bowel  sounds.  She has trace peripheral edema.   EKG today shows sinus rhythm with nonspecific ST-T wave changes and  increased voltage.   PROBLEMS INCLUDE:  1. History of CONTRAST DYE allergy.  2. Coronary disease status post coronary artery bypass graft in April      of 2003 with a LIMA to the LAD, vein graft to the OM and vein graft      to the right coronary artery.  Catheterization in February of 2006      revealed that two of three grafts were patent with a total      occlusion of the vein graft to the OM.  3. History of ejection fraction in the 50% range.  4. Insulin-dependent diabetes.  5. Hypertension, treated.  6. Hyperlipidemia.  She has not tolerated statins.  7. Status post back surgery x2.  8. History of a bladder tacking.  9. Hysterectomy.  10.History of breast surgery.  11.History of parotid surgery.  12.Questionable history of syncope in May of 2008.  She has had no      recurrent symptoms since then.  13.History of gastroparesis related to her diabetes.  14.History of depression.   Overall [name] appears to be stable today.  As outlined above, she   had a Myoview scan in July of 2008.  There is some peri-infarct  ischemia, but no marked ischemia.  Therefore, continued medical therapy  is most appropriate.  I will not be changing any of her medicines today.  I have reviewed her chart extensively.  I will see her back for  cardiology followup.     Luis Abed, MD, [facility]  Electronically Signed    JDK/MedQ  DD: 09/03/2006  DT: 09/04/2006  Job #: [id number]   cc:   Doreen Beam

## 2010-06-04 NOTE — Assessment & Plan Note (Signed)
[facility]                          [facility]   PAIDEN, CARAVEO                       MRN:          [id number]  DATE:09/09/2007                            DOB:          05/03/1930    Ms. Sider is seen for cardiology followup.  I had taken over her care  from Dr. [name] in August 2008 and there is a complete note in our  chart concerning her.  She has coronary disease.  She underwent CABG in  2003.  Catheterization in 2006 revealed 2 of her 3 grafts were patent.  There was total occlusion of the vein graft to the OM.  She is doing  well.  She is not having any chest pain.  She has no significant  shortness of breath.  She has had some vertigo recently.  Otherwise, she  is stable.  She is walking with a 4 prong cane today.   PAST MEDICAL HISTORY:   ALLERGIES:  IODINE and LIPITOR.  Also question of CONTRAST DYE and  ACTOS.   MEDICATIONS:  Depakote, torsemide, Synthroid, Lantus, atenolol, Amaryl,  Altace, Humalog, Celexa, vitamins, aspirin, folic acid, Protonix, Imdur,  Norvasc, lorazepam, Plavix, gemfibrozil, and other vitamins.   OTHER MEDICAL PROBLEMS:  See the list on my note of September 03, 2006.   REVIEW OF SYSTEMS:  She actually is feeling well and her review of  systems is negative.   PHYSICAL EXAMINATION:  VITAL SIGNS:  Blood pressure is 121/70.  Pulse is  72.  Weight is 185 pounds.  GENERAL:  The patient is oriented to person, time and place.  Affect is  normal.  HEENT:  Reveals no xanthelasma.  She has normal extraocular motion.  NECK:  There are no carotid bruits.  There is no jugular venous  distention.  LUNGS:  Clear.  Respiratory effort is not labored.  CARDIAC:  Reveals an S1 with an S2.  There are no clicks or significant  murmurs.  ABDOMEN:  Soft.  She has no peripheral edema.   Ms. Kronick is stable.  Her whole problem list is on the note of September 03, 2006.  Problem #2:  Coronary artery disease.   This is stable.  She does not  need any further workup at this time.  I will see her back in 1 year for  Cardiology followup.     Luis Abed, MD, [facility]  Electronically Signed    JDK/MedQ  DD: 09/09/2007  DT: 09/10/2007  Job #: [id number]   cc:   Kirstie Peri, MD

## 2010-06-04 NOTE — Assessment & Plan Note (Signed)
[facility]                          [facility]   Cathy [name], Cathy [name]                       MRN:          [id number]  DATE:09/06/2008                            DOB:          Jun 22, 1930    HISTORY:  The patient is seen to follow up coronary artery disease.  She  has multiple complaints today.  However, from the cardiac viewpoint, she  is doing well.  She is not having any significant chest pain or  shortness of breath.  The patient underwent CABG in 2003.  At that time,  she also had an ascending thoracic aortic aneurysm graft placed.  Her  vein grafts were anastomosed into that graft.  She has undergone cardiac  catheterization since that time and had the occlusion of one of her  grafts.  She is not currently having chest pain.   PAST MEDICAL HISTORY:   ALLERGIES:  Iodine, citrus, and Lipitor.   MEDICATIONS:  See the flow sheet.   REVIEW OF SYSTEMS:  GENERAL:  The patient denies any fevers, chills,  skin rashes.  She does have headaches.  She has no change in her vision  or her hearing.  She is not having chest pain or shortness of breath.  She is not having palpitations.  She denies any GI or GU symptoms at  this time.  All other systems are reviewed and are negative.   PHYSICAL EXAM:  VITAL SIGNS:  Blood pressure is 122/74 with a pulse of  70.  The patient is oriented to person, time and place.  Affect is  normal.  HEENT:  Reveals no xanthelasma.  She has normal extraocular  motion.  NECK:  There are no carotid bruits.  There is no jugular venous  distention.  LUNGS:  Clear.  RESPIRATORY:  Effort is not labored.  CARDIAC:  Reveals S1-S2.  There are no clicks or significant murmurs.  ABDOMEN:  Soft.  She has no significant peripheral edema.   EKG is done today.  There is sinus rhythm.  There are no diagnostic  changes.  The EKG is reviewed by me.   Problems are listed on the prior notes.  #2.  Coronary disease and  ascending thoracic aortic aneurysm repair with  a graft done in 2003.  We know that one graft is occluded.  We know that  she had a nuclear scan in 2008 revealing question of slight ischemia.  Overall, she is stable.  No further workup is needed at this time.  #3.  Hypertension, stable.   The patient's cardiac status is stable.  No testing and no changes.  I  will see her back in 6 months.     Luis Abed, MD, [facility]  Electronically Signed    JDK/MedQ  DD: 09/06/2008  DT: 09/07/2008  Job #: [id number]   cc:   Doreen Beam, MD

## 2010-06-07 NOTE — Discharge Summary (Signed)
NAMEUNIKA, Cathy [name]                          ACCOUNT NO.:  [id number]   MEDICAL RECORD NO.:  [id number]                   PATIENT TYPE:  INP   LOCATION:  2038                                 FACILITY:  [facility]   PHYSICIAN:  [name], M.D.                  DATE OF BIRTH:  [birth year]   DATE OF ADMISSION:  04/23/2003  DATE OF DISCHARGE:  04/27/2003                           DISCHARGE SUMMARY - REFERRING   DISCHARGE DIAGNOSES:  1. Chest pain, resolved.  2. Coronary artery disease.  3. Diabetes mellitus, oral agents.  4. Hypothyroidism, treated.  5. Hypertension, treated.  6. Anxiety/depression, treated.  7. Gastroesophageal reflux disease.  8. IODINE allergy with anaphylaxis.  9. Allergy to IMDUR and LIPITOR.  10.      Thoracic aneurysm 5 cm ascending, status post Hemashield graft     placement in April 2003.  11.      Status post hysterectomy.  12.      History of partial small bowel obstruction.  13.      Anemia.  14.      Arthritis.  15.      Hyperlipidemia.   HOSPITAL COURSE:  Cathy [name] is a 77 year old female with a history of  coronary artery disease who underwent coronary artery bypass grafting in  April 2003.  On the day of admission, she was laying on the couch and  developed diaphoresis as well as nausea.  A few days ago, she developed  chest pain at rest and this was located under her left breast as well as mid  sternal.  On the date of admission, she had recurrent chest pain and this  was relieved with seven nitroglycerin in the emergency room.   It was felt the patient had multiple symptoms including left upper quadrant  pain and left chest wall pain.  She underwent an adenosine Cardiolite that  revealed an EF of 57% with distal and anterior wall hypokinesis, apical and  inferoapical infarction with no ischemia.  A CT of the abdomen with and  without contrast revealed no evidence of suspicious hepatic or abdominal  mass with a small cyst anteriorly in the  medial segment of the left hepatic  lobe.  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  The MRI of the pelvis suggested an apparent hysterectomy.   Apparently, the patient has had an abnormal abdominal ultrasound in the past  and a CT was recommended in July 2004 but she did not follow up, so we went  ahead and did the CT as mentioned above.  We did explain to the patient that  she would have to follow up her diabetes management with Dr. Eliberto Ivory.   At this point, we went ahead and planned for her discharge on April 27, 2003.  She will be  discharged to home in stable condition.   LABORATORY STUDIES:  White count 9.4, hemoglobin 12.3, hematocrit of 36.7,  platelets 281.  A sodium of 141, potassium of 4.1, BUN 17, creatinine 1.  Hemoglobin A1c of 9.  Cardiac isoenzymes are negative.  Total cholesterol of  174, triglycerides 207, HDL 31, LDL 102.  TSH 1.627.  Urinalysis:  Negative.   EKG reveals normal sinus rhythm, rate 62 with T-wave inversion in the  inferolateral leads.   DISCHARGE MEDICATIONS:  She is to continue the same medications as prior to  admission which include:  1. Prozac 20 mg a day.  2. Synthroid 112 mcg 1 tablet daily.  3. Norvasc 5 mg a day.  4. Insulin.  5. Aspirin 325 mg a day.  6. Potassium.  7. Colace.  8. Altace 5 mg a day.  9. Atenolol 75 mg a day.  10.      Lopid 600 mg b.i.d.  11.      Plavix 75 mg a day.  12.      Protonix 40 mg a day.  13.      Glucophage 500 mg b.i.d.  14.      Demadex 20 mg a day.  15.      Hydrocodone as needed.  16.      Phenergan as needed.  17.      Darvocet as needed.  18.      Calcium 600 mg daily.  19.      Lorazepam 1 mg a day as needed.   ACTIVITY:  As tolerated.   Remain on low-fat diabetic diet.  She is to call for any questions or  concerns.  She needs to follow up with Dr. Eliberto Ivory on one week.  She is  to  call for an appointment for this.  She has an appointment scheduled with Dr.  [name] on May 10, 2003 at 4:15 p.m.      Guy Franco, P.A. [facility]                      [name], M.D.    LB/MEDQ  D:  05/30/2003  T:  05/30/2003  Job:  [id number]   cc:   Arturo Morton. [name], M.D. [facility]   Dr. Edilia Bo, [place]

## 2010-06-07 NOTE — Assessment & Plan Note (Signed)
[facility]                                 ON-CALL NOTE   Cathy, [name]                       MRN:          [id number]  DATE:08/05/2006                            DOB:          [birth year]    HISTORY:  [name] is a 77 year old female patient with a history of  coronary artery disease, status post CABG, who was last evaluated in the  hospital in February of 2008.  According to the note, she had a negative  Cardiolite study at that time.  She called the answering service tonight  with complaints of chest pain and nausea and hypotension.  Her blood  pressure is 95/67.  She had been in her usual state of health until  today at around 5 o'clock.  She started to notice left chest pressure  that would come and go.  It lasted for about 2-3 minutes.  She also  noticed some feelings of indigestion.  Pain would improve with movement.  She noticed diaphoresis, but no syncope, near syncope.  She has noted  dizziness.  She has noted some pain in her left arm.  She is currently  in no distress, and notices just minimal pain on the telephone.   PLAN:  I explained to [name] that she should be further evaluated in  the emergency room tonight.  I advised her to go to the nearest  emergency room which is at [facility] in [place].  At this point in  time she seems to be stable.  I did explain to her that if she feels  like her pain is worsening, or if she is starting to feel worse, that  she should not hesitate to call 911 and have EMS transport her.   DISPOSITION:  She agrees to the above.      Tereso Newcomer, PA-C  Electronically Signed      Arturo Morton. [name], MD, [facility]  Electronically Signed   SW/MedQ  DD: 08/04/2006  DT: 08/05/2006  Job #: [id number]

## 2010-06-07 NOTE — Discharge Summary (Signed)
Cathy [name], Cathy [name]                          ACCOUNT NO.:  [id number]   MEDICAL RECORD NO.:  [id number]                   PATIENT TYPE:  INP   LOCATION:  2009                                 FACILITY:  [facility]   PHYSICIAN:  Arturo Morton. [name], M.D. [facility]         DATE OF BIRTH:  1930-04-18   DATE OF ADMISSION:  11/17/2001  DATE OF DISCHARGE:  11/21/2001                           DISCHARGE SUMMARY - REFERRING   HISTORY OF PRESENT ILLNESS:  The patient is a 77 year old white female with  ischemic heart disease per prior CABG by Dr. Donata Clay in March of this  year. Over the last couple of weeks she has had some recurrent chest pain  and was admitted for evaluation.   PAST MEDICAL HISTORY:  Significant for:  1. Insulin dependent diabetes mellitus.  2. Treated hypothyroidism.  3. History of anaphylactoid reaction to contrast dye.   LABORATORY DATA:  Renal function totally normal. Hemoglobin 12.9, hematocrit  37.9. CKs were slightly elevated up to 378 with MB of 9.9. Troponins were  low. LFTs were normal.   A CT scan of the chest revealed evidence of thoracic aneurysm repair  with  low attenuation surrounding the aortic graft. There was no evidence of  pleural effusion. 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  A  chest x-ray was unremarkable except for some cardiomegaly and evidence of  prior coronary artery bypass graft.   HOSPITAL COURSE:  The patient was admitted and did have slight elevation of  CKs as above but negative troponins. She was seen by Dr. [name] who felt we  ought to go ahead and catheterize her. Dr. Donata Clay consulted for her  atypical chest pain and felt the chest CT scan as above was OK. At  catheterization her LIMA to the LAD was patent, and she did have a 90%  lesion in the very distal portion of the native LAD. She had an 80% right at  a proximal vein graft to the right. Her native circumflex revealed a wide  patency at a previously stented ramus  site. The vein graft to the OM was  totalled. As above she had good flow down the OM. Dr. [name] felt we should  continue her on medical therapy. We added Imdur to her regimen. She was  discharged to home two days later and was feeling quite well at that time.   FINAL DIAGNOSES:  1. Coronary artery disease with prior coronary artery bypass graft in March     of this year. She was readmitted on this occasion with recurrent chest     pain and had borderline enzymes. She was recathed and noted to have     totalled vein graft to the obtuse marginal. She has good flow to her     native obtuse marginal though. She has a chronically totalled native     circumflex.  At this point we will continue medical  therapy.  2. Insulin dependent diabetes mellitus.  3. Treated hypothyroidism.  4. Treated hypertension.  5. Gastroesophageal reflux disease.  6. Status post repair of ascending thoracic aneurysm. A CT scan of the chest     this admission looked stable per Dr. Donata Clay.    DISPOSITION:  Continue her on home medications, plus will add Imdur 50 mg to  her regimen. She is to see Dr. [name] back in several weeks.     Dian Queen, P.A. [facility]                     Thomas D. [name], M.D. [facility]    BY/MEDQ  D:  11/21/2001  T:  11/22/2001  Job:  [id number]   cc:   [facility]  [place], [place]   Dr. Edilia Bo, [place]

## 2010-06-07 NOTE — H&P (Signed)
NAMENAVEENA, EYMAN NO.:  [id number]   MEDICAL RECORD NO.:  [id number]          PATIENT TYPE:  INP   LOCATION:  1830                         FACILITY:  [facility]   PHYSICIAN:  Lonia Blood, M.D.       DATE OF BIRTH:  December 24, 1930   DATE OF ADMISSION:  02/18/2006  DATE OF DISCHARGE:                              HISTORY & PHYSICAL   PRIMARY CARE PHYSICIAN:  The patient primary care physician care  physician is in [place], [place] which makes the patient unassigned  for [employer].   CHIEF COMPLAINTS:  Chest pain.   HISTORY OF PRESENT ILLNESS:  Mrs. Prindle is a 77 year old woman with  history of coronary artery disease, status post CABG, gastroparesis,  diabetes mellitus, hypertension who presents to [facility] emergency  room with vague complaints of chest pain.  She reports that she has  retrosternal chest pain which is the same as usual and is not worse by  effort.  The patient also reports some nausea.  She reports a constant  headache.   PAST MEDICAL HISTORY:  Positive for coronary disease,  gastroparesis,  diabetes mellitus type 2, hypertension,  cholelithiasis,  migraine  headaches, hypothyroidism.   HOME MEDICATIONS:  1. Torsemide 20 mg Monday, Wednesday and Friday.  2. Altace 5 mg daily.  3. Amaryl 4 mg daily.  4. Aspirin 325 mg daily.  5. Atenolol 50 mg twice a day.  6. Citalopram 20 mg daily.  7. Folic acid 1 mg daily.  8. Levothyroxine 112 mcg half a tablet daily.  9. Ativan 1 mg at bedtime.  10.Phenergan 12.5 mg as needed.  11.Plavix 75 mg daily.  12.Norvasc 5 mg daily.  13.Lopid 600 mg twice a day.  14.Reglan before each meal.  15.Multivitamin daily.   SOCIAL HISTORY:  The patient is currently retired.  [place]  She is divorced.  Does not drink alcohol.  Does smoke  cigarettes.   FAMILY HISTORY:  Positive for heart disease.   REVIEW OF SYSTEMS:  Positive for some fever, chills, positive for  dysuria.  Negative for diarrhea.  Other systems per HPI.  All other  systems negative.   PHYSICAL EXAMINATION:  VITAL SIGNS:  Temperature is 97.2, blood pressure  115/66, pulse 92, respirations 18, saturation 99% on room air.  GENERAL APPEARANCE:  Well-developed, well-nourished, in no acute  distress.  Oriented to place, person and time.  HEENT:  Normocephalic, atraumatic.  Pupils are equal, round reactive to  light and accommodation. Extraocular movements intact.  Throat clear.  NECK:  Supple. No JVD and carotid bruits.  CHEST:  Clear to auscultation bilaterally without wheezes or crackles.  HEART:  Regular rate and rhythm without murmurs, rubs or gallops.  ABDOMEN:  Soft, nontender.  Bowel sounds present.  EXTREMITIES:  There is an erythema.  SKIN:  Warm, dry without any suspicious rashes.   LABORATORY DATA:  At the time of admission, creatinine 1, sodium 139,  potassium 4, BUN 27.  Myoglobin 173, INR 0.9.  Hemoglobin 14.6.  An EKG  shows  prolonged QT interval.  Chest x-ray shows probably early right  lower lobe infiltrate,   ASSESSMENT/PLAN:  Problem #1.  Chest pain.  Mrs. Lobos's chest pain is  really atypical in nature.  She does have a history of coronary artery  disease days, but she had a recent stress test which was within normal  limits.  The patient has enough risk factors that this chest pain could  be  cardiac in nature, but I am actually inclined to think it is much  more musculoskeletal rather than cardiac injury.  Plan:  The patient  will be admitted to telemetry and three sets of cardiac enzymes will be  obtained.  Problem #2. Probable right lower lobe pneumonia.  The chest x-ray calls  for possibility of right lower lobe pneumonia.  Clinically I do not find  enough evidence to sustain this diagnosis, so for now will treat with  doxycycline orally and do the chest x-ray PA and lateral to further  evaluate the possible infiltrate.  Problem #3.  Prolonged QT  interval, most likely related to the  medication use.  Will adjust medications and follow-up EKG.  We  probably should also check a magnesium level for completeness.  Problem #4.  Nausea and vomiting.  This is definitely related to  gastroparesis.  Clear liquid diet will be used and the patient will be  observed.  Problem #5.  Depression.  Will continue the patient's citalopram while  in the hospital.      Lonia Blood, M.D.  Electronically Signed     SL/MEDQ  D:  02/19/2006  T:  02/19/2006  Job:  [id number]

## 2010-06-07 NOTE — Discharge Summary (Signed)
[name], Cathy                ACCOUNT NO.:  [id number]   MEDICAL RECORD NO.:  [id number]          PATIENT TYPE:  INP   LOCATION:  6742                         FACILITY:  [facility]   PHYSICIAN:  Madaline Savage, MD        DATE OF BIRTH:  [birth year]   DATE OF ADMISSION:  02/18/2006  DATE OF DISCHARGE:  02/26/2006                               DISCHARGE SUMMARY   ADDENDUM TO DISCHARGE SUMMARY   This is an addendum to a discharge summary dictated by Dr. Ricke Hey on  February 23, 2006.  For a complete list of discharge diagnoses and the  problem list and discharge medications, see the last discharge summary.   ADDENDUM:  [name] has been doing well.  In the last couple of days,  her blood sugars have been under better control.  It still needs tighter  control, which can be achieved as an outpatient.  She also had  complaints of nausea and lightheadedness.  We have increased her Reglan  and her symptoms are under better control at this point of time.  She is  now deemed medically stable to be discharged home.   DISPOSITION:  She is now being discharged home under stable condition.  She has been given instructions about her medications.  She has been  started on Coreg and she has been asked to stop atenolol.  She has been  also asked to stop taking Norvasc, which can be restarted as an  outpatient as per the primary care doctor.  She has been asked to follow  up with her primary care doctor in a week's time.      Madaline Savage, MD  Electronically Signed     PKN/MEDQ  D:  02/26/2006  T:  02/26/2006  Job:  [id number]

## 2010-06-07 NOTE — Cardiovascular Report (Signed)
[place]  Patient:    Cathy [name], Cathy [name] Visit Number: [id number] MRN: [id number]          Service Type: MED Location: [id number] Attending Physician:  Talitha Givens Dictated by:   Jonelle Sidle, M.D. Proc. Date: 09/10/00 Admit Date:  09/09/2000 Discharge Date: 09/11/2000                          Cardiac Catheterization  DATE OF BIRTH:  21-Feb-1930  PRIMARY CARE PHYSICIAN:  Elvina Sidle, M.D.  Corinda Gubler CARDIOLOGIST:  Arturo Morton. [name], M.D.  PROCEDURES PERFORMED:  Selective coronary angiography.  DESCRIPTION OF PROCEDURE:  After informed consent was obtained, the patient as taken to the cardiac catheterization lab.  She was prepped and draped in the usual sterile fashion, and the area about the right femoral artery was anesthetized with 1% lidocaine.  A 6 French sheath was placed in the right femoral artery via the modified Seldinger technique.  Selective coronary angiography was performed using JL4, JR4 and AL-1 catheters.  The patient tolerated the procedure well without obvious complications.  HEMODYNAMICS:  Aorta 157/74 mmHg.  ANGIOGRAPHIC FINDINGS: 1. The left main coronary artery is free of significant flow-limiting    coronary artery disease. 2. The left anterior descending coronary artery has a 20% stenosis proximally    in the prior stent site.  There are 40-50% tandem stenoses at the mid    vessel level followed by an 80% distal stenosis and a 90% apical    stenosis.  There are two diagonal branches. 3. The circumflex coronary artery is occluded proximally after the takeoff of    a very large obtuse marginal branch that bifurcates and supplies a large    portion of the lateral wall.  There is a 40% stenosis in the proximal    stent site within the large obtuse marginal branch, as well as a 50%    more distal stenosis.  The distal circumflex proper fills faintly via    the left to right collaterals. 4. The  right coronary artery has a 95% proximal stenosis followed by a    40% mid vessel stenosis.  TIMI-2 flow is observed.  DIAGNOSIS:  Multivessel coronary artery disease as described. There is 95% re-stenosis involving the proximal right coronary artery, a site which was angioplastied approximately one month ago.  RECOMMENDATIONS:  Discussed case with Dr. Chales Abrahams.  Will proceed with percutaneous coronary intervention to treat the 95% re-stenosis of the proximal RCA. Dictated by:   Jonelle Sidle, M.D. Attending Physician:  Talitha Givens DD:  09/10/00 TD:  09/11/00 Job: [id number]

## 2010-06-07 NOTE — H&P (Signed)
NAMEMIYANA, MORDECAI                          ACCOUNT NO.:  [id number]   MEDICAL RECORD NO.:  [id number]                   PATIENT TYPE:  EMS   LOCATION:  MAJO                                 FACILITY:  [facility]   PHYSICIAN:  Arturo Morton. [name], M.D. [facility]         DATE OF BIRTH:  09-27-1930   DATE OF ADMISSION:  01/04/2002  DATE OF DISCHARGE:                                HISTORY & PHYSICAL   CHIEF COMPLAINT:  Chest pain.   HISTORY OF PRESENT ILLNESS:  The patient is well known to me.  She is a 77-  year-old female who is an insulin-dependent diabetic.  She has had previous  resection of an aortic thoracic aneurysm as well as coronary artery bypass  graft surgery for multivessel coronary artery disease.  She has had multiple  prior pecutaneous coronary interventions done mostly by me and our team.  She has had an allergy to CONTRAST DYE yielding anaphylaxis during contrast  injection.  Since surgery, she has had some recurrent chest discomfort, and  it has been difficult to be exactly sure as to the etiology.  I have had her  seen by Dr. Donata Clay.  She had a left internal mammary placed to the LAD  which continues to be patent, although she has diffuse distal disease.  She  had a saphenous vein graft placed to the obtuse marginal, but this graft was  occluded; however, the obtuse marginal itself was relatively patent from  previous stenting in the proximal vessel.  There is an A-V circumflex that  is also diffusely diseased.  The vein graft to the right demonstrates some  at least partial narrowing at its ostial location from the aortic graft, and  I reviewed this with Dr. Donata Clay previously who suggested that we avoid  stenting of this area as this represents a vein graft interface.  She also  has patency of the right coronary artery with about 30 to 50% narrowing in  the proximal stent.  The mid site of angioplasty has 30% narrowing, and the  distal vessel was poorly seen largely  because of competitive flow.  Importantly, these findings suggest certainly that there is reasonable  distal flow.  She does have normal left ventricular function and intact  Hemashield graft.  She presents now with recurrent and constant chest pain  involving the left parasternal area.  Importantly, her sister died on  2002/01/16 of cancer.  She has been staying with her.  She has had some  nausea, vomiting, and some diaphoresis but decided to come to the emergency  room.  She has also had some low-grade fever and some malaise as well as  some chills and also some foul-odor urine.   PAST MEDICAL HISTORY:  1. Hypertension.  2. Hyperlipidemia.  3. Insulin-dependent diabetes mellitus.  4. She had post CABG anemia.  5. Gastroesophageal reflux.  6. Recent history of shingles.  7. History of  intestinal instruction.   SOCIAL HISTORY:  The patient lives in [place].  She does not smoke.  She is  divorced and has several children.  She lives alone currently after the  death of her sister, and she is a retired Financial controller for [employer].   FAMILY HISTORY:  Mother died from coronary artery disease.  Father died of a  CVA.  She has had three sisters, all of whom died from cancer of one form or  another.   ALLERGIES:  Intolerance to IMDUR and anaphylaxis associated with IV  CONTRAST.   MEDICATIONS:  1. Multivitamins 1 daily.  2. Prilosec 20 mg q.d.  3. Darvocet p.r.n.  4. Potassium 10 mEq q.d.  5. Aspirin 325 mg daily.  6. Metoprolol 50 mg 1/2 tablet b.i.d.  7. Synthroid 112 mcg 1/2 tablet daily.  8. Glucotrol XL 5 mg p.o. b.i.d.  9. Lipitor 10 mg q.h.s.  10.      Prozac 20 mg q.d.  11.      Demadex 20 mg q.d.  12.      Humulin N 36 units q.a.m.  13.      Humulin N 20 units q.p.m.  14.      Glucophage 500 mg q.h.s.  15.      Norvasc 5 mg 1/2 tablet daily.   REVIEW OF SYSTEMS:  Remarkable for chills and some headache.  She has  odorous urine.  She has had some depression as well as anxiety  and some  arthralgias.  She has had recent shingles which came up after presenting  with some flank pain.  Review of Systems is otherwise negative.   PHYSICAL EXAMINATION:  GENERAL:  Alert, oriented, and cooperative female in  no acute distress.  VITAL SIGNS:  Temperature 101.6, pulse 80, respiratory rate 20, and blood  pressure 146/68.  Room air saturation 95%.  HEENT:  Examination is unremarkable.  NECK: Supple.  There is no obvious lymphadenopathy.  CARDIAC:  There is an apical systolic murmur that is 2/6.  LUNGS:  Minimal crackles in the bases.  ABDOMEN:  Soft.  There is some tenderness in the area where the prior  shingles were noted in the left flank.  EXTREMITIES:  No obvious edema.  NEUROLOGIC:  Remarkable for the patient being alert and oriented with intact  cranial nerves.   LABORATORY DATA:  Chest x-ray reveals some cardiomegaly, mild bibasilar  atelectasis.   EKG reveals normal sinus rhythm, rate 79, minor nonspecific ST-T  abnormality.   Initial blood work includes a glucose of 181, BUN 12.  Hematocrit 43.  Her  pH was 7.39, PCO2 of 42.  Other laboratory studies are pending.   IMPRESSION:  1. Recurrent chest pain, not definitely ischemic, status post coronary     artery bypass graft surgery with Hemashield aortic replacement and prior     multiple coronary interventions as described above.  2. History of IV CONTRAST anaphylaxis.  3. Hyperlipidemia.  4. Insulin-dependent diabetes mellitus.  5. Hypothyroidism.  6. Depression.  7. Prior intestinal obstruction.  8. Prior anemia.  9. History of recent shingles.   PLAN:  1. Admit to the hospital with reassurance.  2. Repeat EKG and serial cardiac biomarkers.  3. Continue current medical regimen, monitoring sugars.  4. Attempt to defer further studies for now given recent cardiac     catheterization in the past six weeks with patent internal mammary and     occluded vein graft to the obtuse marginal.  Arturo Morton. [name], M.D. [facility]    TDS/MEDQ  D:  01/04/2002  T:  01/04/2002  Job:  [id number]   cc:   Winona Legato, M.D.

## 2010-06-07 NOTE — H&P (Signed)
Cathy [name], Cathy [name]                          ACCOUNT NO.:  [id number]   MEDICAL RECORD NO.:  [id number]                   PATIENT TYPE:  INP   LOCATION:  2037                                 FACILITY:  [facility]   PHYSICIAN:  Veneda Melter, M.D. [facility]               DATE OF BIRTH:  Oct 05, 1930   DATE OF ADMISSION:  04/09/2002  DATE OF DISCHARGE:                                HISTORY & PHYSICAL   HISTORY:  The patient is a 77 year old female with a known history of  coronary artery disease, who presents with crescendo angina.  The patient  has noted sensation of left-sided chest pain into the left arm with exertion  for the past two weeks associated with some mild shortness of breath.  Over  the last three to four days, this has increased in intensity and severity  with radiation to the arm and neck as well as to the left side of her  abdomen.  She took several nitroglycerin tablets yesterday with partial  relief.  Today, she had increase of pain, prompting presentation to the  emergency room.  The patient has a history of diabetes mellitus and  underwent coronary artery bypass graft surgery in April of 2003; she also  had a thoracic aneurysm at that time.  She presented in October with chest  pain and catheterization on November 21, 2001 showed occlusion of saphenous  vein grafts to the left circumflex as well as intermediate arteries.  She  had 50% tapering of the vein graft to the right coronary artery and a patent  LIMA to the LAD with well-preserved LV function.  At that time, she was  started on Imdur, however, she had been intolerant to this due to headaches,  prompting its discontinuation.   REVIEW OF SYSTEMS:  Review of systems is notable for nausea with left-sided  chest discomfort but no vomiting or bright red blood per rectum, melena,  hematuria or dysuria.  Other systems are noncontributory.   ALLERGIES:  She has anaphylactic reaction to IODINE.   MEDICATIONS:  1.  Multivitamin one tablet daily.  2. Calcium 500 mg b.i.d.  3. Darvocet as needed for pain.  4. Kay Ciel 20 mEq daily.  5. Aspirin 81 mg daily.  6. Metoprolol 25 mg b.i.d.  7. Synthroid 0.061 mg daily.  8. Lipitor 10 mg daily.  9. Prozac 20 mg daily.  10.      Demadex 20 mg per day as needed.  11.      Insulin 46 units q.a.m. and 20 units q.p.m.  12.      Lopid 600 mg b.i.d.  13.      Norvasc 5 mg daily.  14.      Glucophage 500 mg b.i.d.   PAST MEDICAL HISTORY:  Past medical history is notable for coronary artery  disease, as noted, with coronary artery bypass graft surgery in April 2003  by Dr. Kathlee Nations Trigt.  She has a history of thoracic aneurysm repair at  that time.  She is status post hysterectomy, partial small-bowel  obstruction, diabetes mellitus, gastroesophageal reflux disease and  hypertension.   PHYSICAL EXAMINATION:  GENERAL:  On examination, she is an overweight white  female in no acute distress.  VITAL SIGNS:  She is afebrile.  Blood pressure 135/71, pulse 79,  respirations 18.  HEENT:  Pupils are equal, round and reactive to light.  Extraocular muscles  are intact.  Oropharynx shows no lesions.  NECK:  Neck is supple with no adenopathy.  HEART:  Exam reveals a regular rate without murmurs.  LUNGS:  Lungs are clear to auscultation.  ABDOMEN:  Abdomen is soft and nontender.  EXTREMITIES:  No peripheral edema.  Peripheral pulses are palpable but  diminished.  NEUROLOGIC:  Motor strength is 5/5.  Sensory is intact to touch.  SKIN:  Skin shows no lesions.  CHEST:  Chest wall is nontender to palpation.   LABORATORY AND ACCESSORY CLINICAL DATA:  ECG is normal sinus at 87,  nonspecific T wave changes, no acute change from December of 2003.   ASSESSMENT AND PLAN:  The patient is a 77 year old female who presents with  crescendo angina and has become unstable.  The patient reports reproducible  pain while riding her bike or walking and in the last two to three  days, has  had pain and hurting at rest.  She has had coronary artery bypass graft  surgery one year ago and has known occlusion of vein grafts to the left  circumflex and intermediate arteries with distal small vessel disease.  At  that time, no clear indication for percutaneous intervention was identified.  It was recommended that medical therapy be pursued and I would favor  continuing this course, unless the patient has persistent angina or develops  hemodynamic instability.  She has hypertension that apparently is poorly  controlled.  The patient has noted that her blood pressure has been elevated  at home.  Whether this is a trigger event or response to her pain is  unclear, however, there is room at this point to work with her blood  pressure.  We will increase her metoprolol to 50 mg b.i.d., will continue  her Norvasc and start her on Altace 2.5 mg per day.  Unfortunately, she has  been intolerant of nitrates in the past, although we may be able to  rechallenge her at a lower lose.  We will cycle cardiac enzymes to rule out  acute injury, however, I will hold off on anticoagulation unless signs or  symptoms of ischemia are demonstrable.  Should she have any recurrent chest  discomfort, consideration will be given towards repeat cardiac  catheterization.                                                 Veneda Melter, M.D. [facility]    NG/MEDQ  D:  04/09/2002  T:  04/11/2002  Job:  [id number]   cc:   Quita Skye. Hart Rochester, M.D.  [address]  [place]  [place] [address]  Fax: [phone]   Arturo Morton. [name], M.D. [facility]

## 2010-06-07 NOTE — Discharge Summary (Signed)
[facility]  Patient:    Cathy [name], Cathy [name] Visit Number: [id number] MRN: [id number]          Service Type: MED Location: MICU 2110 01 Attending Physician:  Veneda Melter Dictated by:   Abelino Derrick, P.A.C. [facility] Admit Date:  08/11/2000 Discharge Date: 08/15/2000   CC:         Dr. Elvina Sidle, [address] [place], [place] [address]                  Referring Physician Discharge [facility]  DISCHARGE DIAGNOSES: 1. Unstable angina, status post percutaneous coronary intervention to the    right coronary artery this admission. 2. Non-insulin-dependent diabetes. 3. Treated hypertension. 4. History of hyperlipidemia. 5. History of CONTRAST ALLERGY.  HOSPITAL COURSE:  Patient is a 77 year old female followed by Dr. [name] and Dr. Milus Glazier who had a stent to the LAD and RCA angioplasty in 1998.  In January 2001 she had a stent to the circumflex intermediate complicated by anaphylaxis.  In October 2001 she had a PTCA for in-stent restenosis to the circumflex intermediate.  She was admitted as a transfer from [facility] August 11, 2000 with chest pain consistent with unstable angina.  She was admitted to telemetry, started on heparin and nitrates.  Troponins were slightly positive at 0.26, CKs went to 100.  She was set up for restudy.  She did receive preoperative contrast prophylaxis - Prednisone 60 mg 18 hours prior to procedure and Prednisone 60 mg one to two hours prior to procedure. Catheterization went without complication August 12, 2000 by Dr. [name].  She had a new proximal RCA narrowing, the old RCA site was patent, the circumflex intermediate site was patent, there were some distal 50% lesions in the OM. The native circumflex was totaled after the OM.  The LAD stent site was patent with distal 80-90% LAD narrowing.  EF was 53% with 1-2+ MR.  Plavix was added and she underwent elective RCA intervention August 14, 2000.  The native RCA  was dilated; Dr. [name] did not use a stent.  She was transferred to the floor and ambulated, and we feel she can be discharged August 15, 2000.  DISCHARGE MEDICATIONS:  1. Nexium 40 mg a day.  2. Toprol-XL 25 mg a day.  3. Coated aspirin q.d.  4. Plavix 75 mg a day.  5. Synthroid 0.112 mg a day.  6. Vioxx 25 mg a day.  7. Glucotrol 2.5 mg twice a day.  8. Actos 40 mg a day.  9. Zyprexa 10 mg a day. 10. Lipitor as taken at home. 11. Nitroglycerin sublingual p.r.n.  LABORATORY DATA:  White count 12, hemoglobin 11, hematocrit 31, platelets 171. Sodium 141, potassium 4, BUN 21, creatinine 1.1.  CK was 100 with 6.1 MBs. Troponins peaked at 0.26.  EKG shows sinus rhythm, nonspecific ST changes.  Chest x-ray preliminary result was normal from [facility] emergency room.  DISPOSITION:  Patient is discharged in stable condition and will follow up with Dr. [name].  SPECIAL INSTRUCTIONS:  She was on Aceon prior to admission although this was not continued during this hospitalization.  This may need to be resumed as an outpatient; this will be up to Dr. [name].  She also was put on Plavix although she did not get a stent.  Will discuss with Dr. [name] when she is back in the office how long she should be on Plavix. Dictated by:   Abelino Derrick, P.A.C. [facility]  Attending Physician:  Veneda Melter DD:  08/15/00 TD:  08/15/00 Job: [id number]

## 2010-06-07 NOTE — Discharge Summary (Signed)
Cathy [name], Cathy [name]                          ACCOUNT NO.:  [id number]   MEDICAL RECORD NO.:  [id number]                   PATIENT TYPE:  INP   LOCATION:  2037                                 FACILITY:  [facility]   PHYSICIAN:  Veneda Melter, M.D. [facility]               DATE OF BIRTH:  03/07/1930   DATE OF ADMISSION:  04/09/2002  DATE OF DISCHARGE:  04/15/2002                           DISCHARGE SUMMARY - REFERRING   PROCEDURE:  Adenosine Cardiolite.   HOSPITAL COURSE:  Cathy [name] is a 77 year old female with known coronary  artery disease.  She had left-sided chest pain that radiated into her left  arm with exertion for the past two weeks that was associated with some  shortness of breath.  This had recently increased in severity and intensity,  and she was admitted to the hospital for further evaluation and treatment.   Her enzymes were negative for MI and her medications were adjusted to  improve blood pressure control.  Additionally, she had some lower back pain  and had x-rays done which did not show any acute problem or fracture.  There  was progressive endplate irregularity at L3/4, likely due to degenerative  disk disease.   Cathy [name] was evaluated by Dr. [name] and it was felt that conservative  evaluation was warranted.  Because of this, she was scheduled for an  Adenosine Cardiolite which was performed on April 14, 2002.  The Cardiolite  showed suspicion of inferior ischemia at the basal wall, the inferolateral  wall, and an EF of 54%.   Dr. [name] evaluated her last angiography films as well as the Cardiolite  films.  There was borderline inferior and inferolateral ischemia, and the  SVG to RCA area has some narrowing at the aortic graft/vessel interface.  Dr. Donata Clay was curb-sided on this and he strongly encouraged avoiding PCI  at the graft interface.  The situation was discussed with Cathy [name] and  because her symptoms had improved after PPI and after improving  blood  pressure control, it was felt that she could be discharged with outpatient  follow up arranged.   LABORATORY VALUES:  TSH 2.372.  CK, MB and troponin are negative for MI.  Sodium 137, potassium 4.4, chloride 105, CO2 pending, BUN 33, creatinine  1.6, glucose 174.  Hemoglobin 15.2, hematocrit 44.3, WBC 10.3, platelets  247.  Total cholesterol 190, triglycerides 241, HDL 36, LDL 48.   CHEST X-RAY:  Stable chest with no active findings.   DISCHARGE CONDITION:  Stable.   DISCHARGE DIAGNOSES:  1. Chest pain, possible anginal pain but area of ischemia on Cardiolite not     amenable to percutaneous intervention, therefore medical therapy     recommended.  2. Status post aortocoronary bypass surgery in April of 2003 with left     internal mammary artery to left anterior descending, saphenous vein graft     to obtuse marginal, saphenous  vein graft to right coronary artery.  3. History of thoracic aortic aneurysm repair at the time of bypass with a     graft.  4. Insulin-dependent diabetes mellitus.  5. 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  6. Hypothyroidism.  7. Anemia.  8. Arthritis.  9. Partial small bowel obstruction.  10.      Status post hysterectomy.  11.      Hypertension.  12.      Hyperlipidemia.  13.      Gastroesophageal reflux disease.  14.      History of herpes zoster.  15.      Family history of coronary artery disease.  16.      Intolerance to IMDUR and IV CONTRAST.   DISCHARGE INSTRUCTIONS:  1. Her activity level is to be as tolerated.  2. She is to stick to a low fat and salt diabetic diet.  3. She is to follow up with Dr. [name] on May 6 at 12:15 and she is to     follow up with Dr. Eliberto Ivory as needed.   DISCHARGE MEDICATIONS:  1. Prozac 20 mg every day.  2. Synthroid 112 mcg one-half tab every day.  3. Norvasc 5 mg every day.  4. Insulin 60 units a.m. and 40 units p.m.  5. Aspirin 325 mg every day.  6. KCl 20 mEq every day.  7. Colace 200 mg every day.  8.  Altace 2.5 mg every day.  9. Lipitor 20 mg every day.  10.      Lopid 600 mg b.i.d.  11.      Lopressor 50 mg one and a half tabs b.i.d.  12.      Plavix 75 mg every day.  13.      Protonix 40 mg every day.  14.      Glucophage 500 mg b.i.d.  15.      Demadex 20.  16.      Hydrocodone p.r.n.  17.      Darvocet p.r.n.  18.      Phenergan p.r.n.     Lavella Hammock, P.A. [facility]                  Veneda Melter, M.D. [facility]    RG/MEDQ  D:  04/15/2002  T:  04/16/2002  Job:  [id number]   cc:   Sydnee Levans, MD  [facility]  [address]  [place] [address]  [place]   Thomas D. [name], M.D. [facility]

## 2010-06-07 NOTE — Cardiovascular Report (Signed)
[place]. [facility]  Patient:    Cathy [name], Cathy [name] Visit Number: [id number] MRN: [id number]          Service Type: MED Location: 2000 2011 01 Attending Physician:  Nelta Numbers Dictated by:   Arturo Morton. [name], M.D. [facility] Proc. Date: 03/29/01 Admit Date:  03/27/2001   CC:         Elvina Sidle, M.D.  CV Laboratory   Cardiac Catheterization  INDICATIONS: The patient is well known to me. She has had multiple prior interventions. She has had a PTCA to the mid right coronary, a stent to the left anterior descending, a stent to the ramus intermedius.  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 She has had more recently progression of disease in the proximal right coronary with known distal disease as well as distal disease in the LAD apex. The RCA proximally was initially dilated and then subsequently stented. She then developed in-stent re-stenosis which was then treated with Cutting Balloon as well as radiation therapy. She then developed re-stenosis again and was redilated about 3 to 4 weeks ago. Importantly, while the distal vessel in the AV portion of the artery is fairly large in caliber and suitable for grafting, her inferior vessels are diffusely diseased with diabetic appearance, and there is really no PDA or posterolateral system per se to bypass. In addition, both the proximal portions of the ramus and LAD are widely patent and the disease that exists in these vessels is predominately in the distal portion and not suitable for grafting. Therefore, bypass has not been considered a optimal therapy for her. She has now developed recurrent rest angina and was brought back to the lab for further evaluation.  PROCEDURES: 1. Left heart catheterization. 2. Selective coronary arteriography. 3. Selective left ventriculography. 4. Root aortography.  DESCRIPTION OF PROCEDURE: The patient was brought to the  catheterization lab and prepped and draped in the usual fashion. She was premedication as she has a strong history of prior antephialtic shock associated with contrast. The procedure as performed from the right femoral artery. She tolerated the procedure extremely well. She had Lovenox more than six hours prior to the current procedure. This was subsequently held. She tolerated the procedure without complication. I brought Dr. Chales Abrahams down to the catheterization laboratory and he and I carefully reviewed all the old films and the current films as well. It was our feeling that an attempt at medical therapy should be tried. Nearly four weeks ago, she underwent repeat dilatation of the focal area in the right stent, and at the present we still do not have drug-eluding stent therapy. She has had prior brachytherapy in this territory. We consider placing a current stent, but it was felt that the chance of re-stenosis would be very high. As a result, she was taken to the holding area in satisfactory clinical condition.  HEMODYNAMIC DATA: 1. Central aorta 159/69. 2. LV pressure 170/17. 3. No aortic left ventricular gradient.  ANGIOGRAPHIC DATA: 1. Ventriculography performed in the RAO projection reveals preserved    overall global systolic function. There does appear to be mild apical    hypokinesis and ejection fraction is calculated at 59%. There did not    appear to be significant mitral regurgitation. 2. The aortic root shot did not reveal significant aortic regurgitation nor    was there evidence of a dissection. There was a dilated proximal    aortic root that had been noted on  previous study. This will be    evaluated further with a CT scan. 3. The left main coronary artery is free of critical disease. 4. The left anterior descending artery demonstrates less than 30%    narrowing at the previously placed proximal stent site. Beyond this,    there was a large septal perforator and then  tandem further lesions of    about 30% followed by a diagonal branch. There is about 30% narrowing    beyond this point. In the distal apical vessel there is a previous    70-80% area of narrowing, but this area is diffusely diseased and the    apical vessel now has a 90% stenosis right at the apical tip and just    more proximal to it are tandem lesions of about 70-80% with diffuse    disease. This is a small caliber diffuse diabetic vessel. The distal    inferior portion of the LAD also demonstrates some diffuse disease. There    is also some faint opacification of what probably represents a remnant    PDA from the distal right coronary circulation. 5. The circumflex provides a large marginal branch that has less than 30%    narrowing at the proximal stent site. The vessel has marked tortuosity    with lesions of 40-50% which appear to be unchanged, and this vessel    terminates as an apical bifurcating marginal branch. The AV circumflex    as on previous catheterizations is totally occluded, and this vessel fills    in by retrograde collaterals. 6. The right coronary artery demonstrates a proximally placed stent across    the first big bend. There is perhaps a 70% area of focal narrowing just at    the bend location. Distal to this, the vessel is tortuous and has about a    50% area of eccentric plaquing at the previous old PTCA site, but this    appears to be smooth with an adequate lumen. Distally, the vessel is fairly    large in caliber in the AV portion of the artery, but the distal branches,    which consist of the posterior descending and posterolateral branch are    really quite small, diffusely diseased and diabetic in appearance. There    appear to be total occlusions with ghost vessels distally. This also    appears to be perhaps somewhat progressed from the previous study.  CONCLUSIONS: 1. Preserved overall left ventricular function.  2. Continued patency of the stent to  the left anterior descending,    continued patency of the stent to the ramus intermedius, and    continued patency of the percutaneous transluminal coronary angioplasty    site and the mid right coronary. 3. Partial re-stenosis of the proximally placed stent across the    proximal right coronary bend. 4. Known total old occlusion of the arteriovenous circumflex. 5. Diffuse apical diabetic disease involving the apical tip of the    left anterior descending artery as well as the posterior descending artery,    peripheral laser angioplasty system with small diffusely diseased    diabetic vessels.  DISPOSITION: Dr. Chales Abrahams and I have very carefully reviewed this study. Importantly, the proximal stent sites continue to appear patent. This current stent site has had initially PTCA followed by stenting and then followed by in-stent re-stenosis with repeat dilatation as well as right brachytherapy. Following the brachytherapy he developed a recurrent focal lesion which was then dilated with a Cutting  Balloon approximately three weeks ago. There is again partial re-stenosis which may be in part related to the bend in the vessel, and he may need reinforcement with stenting. Currently, if we place a standard stent, the chances of re-stenosis would be fairly high, and optimally we would favor a drug-eluding stent, although these currently are not available. More importantly, the distal portion of the right coronary artery as regards to supply of inferior myocardium is relatively small with diffusely diseased PDA and posterolateral system. While the AV portion is moderate in size and could be grafted, it would seem that the net gain would be fairly small for grafting this considering the status of the PDA and posterolateral, which appear to be small diffusely diseased diabetic vessels. In addition, there appears to be some progression of disease in the apical portion of the LAD with perhaps now a  ruptured plaque and there is a small wall motion abnormality involving the apex. As a result of this, we are leaning toward medical therapy at the present time. The patient may require something such as EECP. I think I will get a CT scan to try to better evaluate the size of the aortic root. This represents a difficult situation and I have reviewed the films in detail with her family. Dictated by:   Arturo Morton [name], M.D. [facility] Attending Physician:  Nelta Numbers DD:  03/29/01 TD:  03/30/01 Job: [id number]

## 2010-06-07 NOTE — Cardiovascular Report (Signed)
[place]. [facility]  Patient:    Cathy [name], Cathy [name]                       MRN: [id number] Proc. Date: 08/14/00 Adm. Date:  [id number] Attending:  Veneda Melter CC:         Elvina Sidle, M.D.  Cardiac Catheterization Laboratory   Cardiac Catheterization  INDICATIONS:  [name] is well known to [place].  She is a 77 year old female who presents with an acute coronary syndrome.  She has had prior stenting of both the intermediate vessel, as well as the LAD, and a balloon dilatation to the mid right.  She now presents with a non-Q wave, non-ST elevation myocardial infarction, and has evidence of a ruptured plaque on a 120 degree bend in the proximal right coronary artery.  The patient has a history of severe contrast reaction and because of this, we elected to pretreat her.  PROCEDURE PERFORMED:  Percutaneous angioplasty of the proximal right coronary artery.  DESCRIPTION OF PROCEDURE:  The patient was brought to the catheterization laboratory and prepped and draped in the usual fashion.  Through an anterior puncture, the left femoral artery was easily entered.  Because of hypertension, intravenous beta-blockade was administered.  She tolerated this well.  Views of the right coronary artery were obtained with an AL-1 guide. Importantly, heparin and Integrilin were given according to protocol.  We had some trouble crossing this and we were able to cross it with a traverse wire. However, we could not get the 2.5 mm balloon across the artery.  We then attempted to use a 2.0 Graft Ace catheter, but this also would not cross.  We then went back again with the traverse wire and were able to cross and ultimately get a 1.5 mm balloon across the lesion.  Balloon dilatation was performed and then we followed basically with 2.0, 2.25, 2.5, and 2.75 balloons.  We kept in the patient in the laboratory for an additional 10 to 15 minutes to document continued patency of the  right coronary artery.  We considered trying to pass an AVE stent, but we elected not to because of the greater than 90 degree bend and the continued patency after 10 minutes.  She was then taken to the holding area in satisfactory clinical condition.  HEMODYNAMIC DATA:  The central aortic pressure was 181/89.  ANGIOGRAPHIC DATA:  The right coronary artery was a fairly large caliber vessel in the proximal portion, being about 2.75 mm to 3 mm.  On the very first bend, between the proximal and mid vessel, there is a long 90% to 99% stenosis.  There was basically TIMI-2+ flow.  Following balloon dilatation, this was improved to TIMI-3 flow and the stenosis was about 30% or less in the area.  The distal vessel demonstrated good runoff and was a tortuous vessel supplying several small inferior branches.  CONCLUSIONS:  Successful percutaneous angioplasty of the right coronary artery, as described above.  DISPOSITION:  The patient will be treated with aspirin and Plavix.  She is a diabetic and continues to have recurrent acute coronary events.  Aggressive management is recommended. DD:  08/14/00 TD:  08/14/00 Job: [id number]

## 2010-06-07 NOTE — H&P (Signed)
[place]. [facility]  Patient:    Cathy [name], Cathy [name] Visit Number: [id number] MRN: [id number]          Service Type: MED Location: 2300 2303 01 Attending Physician:  Tressie Stalker Dictated by:   Guadalupe Maple, M.D. Admit Date:  04/25/2001                           History and Physical  PROCEDURE:  Intraoperative transesophageal echocardiography.  CLINICAL NOTE:  Cathy [name] is a 77 year old white female with a history of coronary artery disease who was discovered to have an ascending aortic aneurysm.  She is scheduled to undergo coronary artery bypass grafting by Mikey Bussing, M.D.  Intraoperative transesophageal echocardiography was requested to evaluate the extent of the ascending aortic aneurysm and to determine if any aortic insufficiency was present.  DESCRIPTION OF PROCEDURE:  The patient was brought into the operating room at [facility].  General anesthesia was induced without difficulty.  The transesophageal echocardiography probe was entered through the esophagus without difficulty.  Prebypass findings: 1. There was an ascending aortic aneurysm which measured 4.3 cm in its largest    diameter.  It was noted to develop.  There was a widening of the aorta just    distal to the sinotubular junction, but the widest extent of the aneurysm    appeared to be about 4 cm distal to the sinotubular junction.  There was no    aortic dissection present. 2. The aortic valve appeared thickened but was trileaflet, opened normally,    and there was no aortic insufficiency.  The aortic annulus measured 2.2 cm    in diameter.  The sinotubular junction measured 3.2 cm in diameter. 3. The mitral valve leaflet coapted normally.  There was mild thickening of    the mitral valve leaflet but no redundancy, prolapse, or fluttering.  There    was 1+ mitral insufficiency present. 4. There was mild to moderate global left ventricular hypokinesis with  mild    left ventricular hypertrophy.  The left ventricular ejection fraction was    estimated at 45% with localized hypokinesis.  The left ventricular wall    thickness is approximately 1.2-1.4 at the midpapillary level in diastole    of the anterior wall. 5. Right ventricular function appeared normal.  There was good incursion of    the right ventricular free wall. 6. The tricuspid valve was normal-appearing.  There was trace tricuspid    insufficiency. 7. The interatrial septum was intact.  There was no evidence of atrial septal    defect or patent foramen ovale. 8. The left atrium appeared mildly enlarged.  There was no thrombus noted in    the left atrium or the left atrial appendage. 9. The descending aorta was normal-appearing and appeared to be 2-2.5 cm in    diameter.  There were no aneurysmal areas noted in the descending aorta.  Postbypass findings: 1. The aortic valve again appeared competent, but there was mild thickening of    the aortic valve leaflets with good opening and no aortic insufficiency    present. 2. The aneurysm of the ascending aorta had been repaired with a tube graft.    There was no aneurysmal section of the ascending aorta present. 3. Mitral valve function appeared unchanged from the prebypass studies.  The    mitral valve leaflets were mild to moderately thickened but coapted well  without prolapse or fluttering, and there was 1+ mitral insufficiency. 4. The left ventricular function appeared improved from the prebypass study.    The left ventricular cavity size was smaller.  There was improved    contractility of the left ventricle with left ventricular ejection fraction    estimated at 50-55%. Dictated by:   Guadalupe Maple, M.D. Attending Physician:  Tressie Stalker DD:  05/06/01 TD:  05/06/01 Job: [id number]

## 2010-06-07 NOTE — H&P (Signed)
NAMEMarland Kitchen  JETAUN, COLBATH                          ACCOUNT NO.:  [id number]   MEDICAL RECORD NO.:  [id number]                   PATIENT TYPE:  INP   LOCATION:  2009                                 FACILITY:  [facility]   PHYSICIAN:  Duke Salvia, M.D. [facility]           DATE OF BIRTH:  05-Nov-1930   DATE OF ADMISSION:  11/17/2001  DATE OF DISCHARGE:                                HISTORY & PHYSICAL   HISTORY OF PRESENT ILLNESS:  Ms. Lekia Nier is a 77 year old woman with a  history of ischemic heart disease, prior PCI of her coronary circulation who  underwent catheterization in 3/03 demonstrating significant three vessel  disease with normal left ventricular function and then underwent bypass by  Dr. Donata Clay, receiving a LIMA to her LAD, a vein graft to the RCA, a vein  graft to the ramus, as well as an ascending thoracic aortic aneurysm graft  into which were inserted the proximal anastomosis of the vein grafts. Her  postoperative course was relatively prolonged but otherwise unremarkable.   Over the last two weeks, she has had progressive problems with chest  discomfort which has been both exertional and nonexertional. It has been  described as a pressure radiating to her neck. It has been associated with  exertion and nonexertional, typically lasting 5 to 10 minutes, relieved by  both rest and nitroglycerin. Curiously, it also been associated with a  brackish taste in her mouth which has worsened over the same time period.  She does have a history of reflux disease and guaiac positive stool for  which she was scheduled to undergo colonoscopy.   As relates to her chest pain, she was seen at [facility] last week where a  stress test was done on Thursday and was reportedly abnormal. Results were  referred to Dr. [name]. Definitive plans have not yet been made. Because of  progressive symptoms, she presents to the hospital tonight. She is having  chest pain currently.   Her cardiac risk  factors are notable for hypertension and diabetes. She has  insignificant hyperlipidemia, although she is on statin therapy.   PAST MEDICAL HISTORY:  Her past medical history is also notable for a dye  allergy which has been associated with anaphylaxis, treated hypothyroidism,  and anemia, and arthritis.   PAST SURGICAL HISTORY:  Past surgical history is notable for partial SBO,  hysterectomy.   FAMILY HISTORY:  Noncontributory.   SOCIAL HISTORY:  She is divorced. She has three children.   REVIEW OF SYMPTOMS:  Notable as described previously.   MEDICATIONS:  1. Nexium p.r.n. which she has been using frequently as she has her     nitroglycerin.  2. Lopressor 25 b.i.d.  3. Aspirin.  4. K-Dur 10 q.d.  5. Synthroid 0.061 mcg.  6. Glucotrol 5 b.i.d.  7. Lipitor 10.  8. Prozac 20.  9. Demadex p.r.n.  10.  Ativan one h.s.  11.      Lopid 600 b.i.d.  12.      Insulin.   ALLERGIES:  Her drug allergies are to contrast.   PHYSICAL EXAMINATION:  GENERAL:  She is an obese Caucasian female in no  acute distress.  VITAL SIGNS:  Her blood pressure is 113/58, her pulse is 62.  HEENT:  Her HEENT exam demonstrated no scleral icterus or xanthoma.  NECK:  The neck veins were flat. The carotids were brisk and full  bilaterally without bruits.  BACK:  The back was without kyphosis or scoliosis.  LUNGS:  There was some decreased breath sounds with some crackles at the  left base.  HEART:  Auscultation exam demonstrated only a 2/6 systolic murmur in the  supine position. Nothing was heard upright or with Valsalva. S1 and S2 were  normal.  ABDOMEN:  Soft and protuberant. There was epigastric tenderness. There was  no hepatomegaly.  EXTREMITIES:  Femoral pulses were 2+. Distal pulses were intact. There is no  clubbing, cyanosis, or edema.  NEUROLOGIC EXAM:  Grossly normal.   LABORATORY DATA:  Electrocardiogram is not with me right now but showed no  significant abnormalities.   Blood  work was notable for a hemogram of 13. Initial enzymes were negative.  BMET was otherwise okay apart from the sugar of 156.   IMPRESSION:  1. Chest pain consistent with unstable angina.  2. History of coronary artery disease.     A. Status post CABG in 4/03.     B. EF normal at that time.     C. Recent abnormal Cardiolite (last weekend).  3. History of ascending thoracic aneurysm repair, the graft which was     inserted just distal to the coronary ostia.  4. Hypertension.  5. Diabetes.  6. Gastroesophageal reflux disease with guaiac positive stool and epigastric     tenderness.  7. Dye allergy.   DISCUSSION:  Ms. Banghart has recurrent pain consistent with unstable angina,  not withstanding the lack of an abnormal electrocardiogram. The symptoms are  quite significant, and her Cardiolite scan makes it worrisome from premature  graft failure.   I suspect that it is unlikely that there is aneurysm failure given the close  approximation of the proximal graft to the coronary ostia. I have discussed  this with Dr. Laneta Simmers, and he thinks that anticoagulation is probably not  associated with significant risk; however, to look at this prior to  anticoagulation, we will go ahead and CT her chest without contrast at  present to see if there are any abnormalities noted here.   PLAN:  Based on the above, therefore:  1. Proceed with a chest CT.  2. Catheterization following #1.  3. Anticoagulation pending #1.  4. Pretreat for dye allergy.  5.     Nitroglycerin IV.  6. Get the Cardiolite results.  7. Also consider adding an insulin sensitizing agent to her diabetic     regimen.                                               Duke Salvia, M.D. [facility]    SCK/MEDQ  D:  11/17/2001  T:  11/18/2001  Job:  [id number]   cc:   Mikey Bussing, M.D.  [address]  [place]  [place] [address]  Fax: [phone]  [employer]

## 2010-06-07 NOTE — Cardiovascular Report (Signed)
[place]. [facility]  Patient:    Cathy [name], Cathy [name] Visit Number: [id number] MRN: [id number]          Service Type: MED Location: CCUB 2912 01 Attending Physician:  Talitha Givens Dictated by:   Veneda Melter, M.D. [facility] Proc. Date: 11/12/00 Admit Date:  11/11/2000   CC:         Cathy [name], M.D. [facility], [facility]  Dr. Waldemar Dickens   Cardiac Catheterization  PROCEDURES PERFORMED: 1. Left heart catheterization. 2. Left ventriculogram. 3. Selective coronary angiography.  DIAGNOSES: 1. Three vessel coronary artery disease. 2. Normal left ventricular systolic function.  INDICATIONS:  Cathy [name] is a 77 year old white female with diabetes mellitus and aggressive coronary artery disease who has previously undergone multiple percutaneous interventions.  The patient most recently has angioplasty across the right coronary artery in July 2002 with follow-up angioplasty and stent placement in August 2002 for restenosis.  She presents now with unstable angina reminiscent of prior pain.  She was admitted to the hospital to rule out for acute myocardial infarction and is now for further assessment.  TECHNIQUE:  Informed consent was obtained.  The patient was brought to the cardiac catheterization lab.  A 6 French sheath was placed into the right femoral artery, and an AL2 diagnostic catheter was introduced, and selective coronary angiography was then performed using manual injections of contrast. A 6 French pigtail catheter was used to perform left ventriculogram and left heart catheterization.  At the termination of the case, the catheters were removed.  Manual pressure was applied until adequate hemostasis was achieved. The patient tolerated the procedure well and was transferred to the floor in stable condition.  FINDINGS:  Left main trunk:  The left main trunk is a large caliber vessel with mild irregularities.  Left anterior descending:   The left anterior descending is a large caliber vessels that provides the major diagonal branch in the midsection.  There was a large septal perforator proximally.  There is mild diffuse disease of 30% of the proximal to mid LAD.  There is a high grade focal narrowing of 90% at the apex.  Left circumflex artery:  The left circumflex artery is a medium caliber vessel consisting of marginal branch in the distal segment.  The AV circumflex is 100% occluded in its midsection.  TIMI-2 flow of the marginal flow is noted.  Right coronary artery:  The right coronary artery is dominant.  It is a medium caliber vessel that supplies a small posterior ascending and posterior ventricular branch.  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  It has a high grade narrowing of 90% in the proximal area of previous stenting.  LEFT VENTRICULOGRAM:  Normal end-systolic and end-diastolic dimensions. Overall left ventricular function is well-preserved with an ejection fraction of greater than 55%.  There is no mitral regurgitation.  The LV pressure is 156/18, aortic 156/75, left ventricular end-diastolic pressure 26.  ASSESSMENT AND PLAN:  Cathy [name] is a 77 year old female with aggressive and severe two vessel coronary artery disease but well-preserved LV function. Treatment options were reviewed with the patient including repeat percutaneous intervention with adjuvant brachytherapy verus coronary artery bypass graft surgery. At this point, the patient would like to try percutaneous intervention one more time.  We will thus arrange this for her. Dictated by:   Veneda Melter, M.D. [facility] Attending Physician:  Talitha Givens DD:  11/13/00 TD:  11/16/00 Job: [id number]

## 2010-06-07 NOTE — Discharge Summary (Signed)
[place]. [facility]  Patient:    Cathy [name], Cathy [name] Visit Number: [id number] MRN: [id number]          Service Type: MED Location: 2000 2016 01 Attending Physician:  Tressie Stalker Dictated by:   Sherrie George, P.A.-C. Admit Date:  04/25/2001 Discharge Date: 05/17/2001   CC:         Arturo Morton. [name], M.D. [facility]  Elvina Sidle, M.D.   Referring Physician Discharge [place]  ADMISSION DIAGNOSES:  1. Unstable angina with three-vessel coronary artery disease; recurrent chest     pain, 90% left anterior descending stenosis, 100% distal circumflex     stenosis, 70% right coronary artery stenosis, ejection fraction of 60%,     status post multiple PTCAs dating back to 1998.  2. A 4.5 cm ascending thoracic aneurysm.  3. Hypothyroidism.  4. History of small-bowel obstruction.  5. Adult onset diabetes mellitus, non-insulin-dependent.  6. Gastroesophageal reflux disease.  7. Hypertension.  8. Hypercholesterolemia.  9. Anemia. 10. Chronic back pain.  DISCHARGE DIAGNOSES: 1. Class IV unstable angina with three-vessel coronary artery disease.  A 5 cm    ascending thoracic aneurysm. 2. Postoperative anemia. 3. Hypothyroidism. 4. History of small-bowel obstruction. 5. Adult onset diabetes mellitus, non-insulin-dependent. 6. Gastroesophageal reflux disease. 7. Hypertension. 8. Hypercholesterolemia. 9. Chronic low back pain.  PROCEDURES: 1. Coronary artery bypass grafting x 3 with intervascular harvesting of vein,    left internal mammary to the LAD, saphenous vein graft to the right    coronary artery, saphenous vein graft to the ramus. 2. Resection and grafting of descending thoracic aneurysm with a 32 mm    Hemashield graft.  Both procedures were done on May 05, 2001, by    Dr. Kathlee Nations Trigt.  BRIEF HISTORY:  The patient is a 77 year old white female, medical patient of Dr. Bonnee Quin, well known to the [facility] Cardiology Service.  She has  a history of PTCAs and multiple interventions dating back to 14.  Her most recent cardiac cath on March 29, 2001, showed an ejection fraction of 60%, a 90% LAD, total occlusion of the distal circumflex, and a 70% RCA stenosis.  At that time, she was stabilized and was discharged home.  She was seen in consultation by Dr. Tressie Stalker, CVTS, on April 12, 2001.  At that time, it was his impression that she did indeed have a 4.5 cm aneurysmal dilatation of the ascending thoracic aorta which was approximately twice the size of the descending aorta but not associated with any signs of aortic dissection or complications.  In addition, he also noted that her biggest underlying problem remains her coronary artery disease.  It was his opinion that if she needed coronary artery bypass grafting, that it would most likely be reasonable to do the aneurysm repair at that time.  HOSPITAL COURSE:  The patient was readmitted on  April 25, 2001, by [facility] with recurrent chest pain.  She was admitted.  CPKs and troponins were found to be negative, and she was stabilized.  She was seen in consultation again at that time by Dr. Tressie Stalker.  It was his opinion that she should undergo coronary artery bypass grafting and replacement of her ascending thoracic aorta at the same time.  It was tentatively scheduled to be done at a later time after she has had some time to come off her antiplatelet agents.  Unfortunately, the patient had recurrent chest pain with a five beat run of ventricular tachycardia just prior  to her discharge.  Discharge was held.  A repeat 2-D echocardiogram was obtained which showed no AI.  Because of scheduling, the patient was scheduled to undergo surgery by Dr. Kathlee Nations Trigt.  On May 05, 2001, the patient was taken to the operating room and underwent coronary artery bypass grafting x 3 with intervascular harvesting of vein, left internal mammary to the LAD,  saphenous vein graft to the ramus, and saphenous vein graft to the RCA in addition to resection and grafting of the thoracic aortic aneurysm with an interposition 32 mm Hemashield straight graft.  The patient tolerated the procedure well and came off bypass and was transferred to the intensive care unit in satisfactory condition.  Transesophageal echocardiogram showed good aortic valve function after cardiopulmonary bypass.  The patient was treated with platelets FFP post cardiac bypass for coagulopathy and transferred to the ICU.  Postoperatively, she awoke without any neurologic problems and remained hemodynamically stable. She has had no further arrhythmias and was placed on amiodarone postoperatively.  She was extremely anemic and was ultimately transfused and placed on iron and folate.  She was transferred to 2000, started on phase 1 cardiac rehab.  She has made slow, steady progress, and it was Dr. Vincent Gros opinion that she would be ready for discharge around April 23 or May 13, 2001.  Her wounds are healing nicely.  Intervascular harvest site looks good.  Sternum is stable.  Chest shows a few basilar rales. The patient continues to make slow improvement, and we will decide on discharge in the a.m., May 13, 2001.  As of May 12, 2001, she was walking up to 330 feet and tolerating it well.  The pacing wires were removed on May 12, 2001.  She remains in a sinus rhythm.  DISCHARGE LABORATORY DATA:  Her hemoglobin was down to 7.  She has been transfused and as of May 11, 2001, hemoglobin is 9.8 with hematocrit of 28.7, white count 11.3, platelets 253,000.  Electrolytes are normal.  BUN is 10, creatinine 0.9, glucose 252.  CONDITION ON DISCHARGE:  Improving.  DISCHARGE MEDICATIONS:  1. Coated aspirin 325 mg 1 q.d.  2. Ultram 50 mg 1-2 p.o. q.4h. p.r.n.  3. Lopressor 25 mg q.12h.  4. Multivitamin with iron 1 q.d.  5. Amiodarone 200 mg p.o. q.12h.  6. Glucotrol XL 5 mg  q.a.m. a.c.  7. Synthroid 112 mcg q.d.   8. Protonix 40 mg q.d.  9. Prozac 20 mg q.d. 10. Ativan 0.5 mg q.12h. p.r.n.  DIET:  Low fat, low salt, ADA diet.  FOLLOW-UP: 1. She will have her staples removed by home health nurse, and a restorative    home health nurse has been requested. 2. Follow-up by Dr. [name] on May 20, 2001, at 3:30 p.m. 3. The patient will return to see Dr. Donata Clay on Friday, Jun 04, 2001, at    9:30 a.m. Dictated by:   Sherrie George, P.A.-C. Attending Physician:  Tressie Stalker DD:  05/12/01 TD:  05/12/01 Job: [id number]

## 2010-06-07 NOTE — Consult Note (Signed)
[name], [name] NO.:  [id number]   MEDICAL RECORD NO.:  [id number]          PATIENT TYPE:  INP   LOCATION:  2025                         FACILITY:  [facility]   PHYSICIAN:  Jonelle Sidle, M.D. LHCDATE OF BIRTH:  [birth year]   DATE OF CONSULTATION:  DATE OF DISCHARGE:                                   CONSULTATION   PRIMARY CARE PHYSICIAN:  Weyman Pedro, M.D., [place], [place].   PRIMARY CARDIOLOGIST:  Arturo Morton. [name], M.D. [facility]   REASON FOR CONSULTATION:  Chest pain.   HISTORY OF PRESENT ILLNESS:  Cathy [name] is a 77 year old woman with a  history of coronary artery disease status post coronary artery bypass  grafting in March of 2003, ascending aortic aneurysm status post Hemashield  graft repair also in March of 2003, hypertension, hyperlipidemia, type 2  diabetes mellitus, and hypothyroidism.  She is followed by Dr. [name] and  was last seen in the office in July of 2006.  She is now admitted to the  hospital, having called EMS due to worsening chest pain.  She is a difficult  historian but seems to indicate that she has had increasing episodes of  chest discomfort at rest since Christmas of 2006.  She has been using  nitroglycerin more frequently.  She concurrently complains of a variety of  other issues, including intermittent nausea and abdominal pain, headaches,  recent dental problems, and dysuria.  She reports compliance with her  medications.  Ms. [name] is now admitted to the [facility]  Service and was noted to have a single point-of-care troponin I level of  0.08, although all subsequent standard levels have been normal.  Her  electrocardiogram shows no acute changes, with old inferolateral ST-T wave  abnormalities.  She is not having any active chest pain at present.  We were  asked to evaluate her further.   ALLERGIES:  POSSIBLE INTOLERANCE TO IMDUR.  THE PATIENT ALSO REPORTS  INTOLERANCE TO LIPITOR.  SHE HAS A DOCUMENTED  IV CONTRAST ALLERGY.   HOME MEDICATIONS:  1.  Sublingual nitroglycerin 0.4 mg p.r.n.  2.  Celexa 20 mg p.o. daily.  3.  Folic acid 1 mg p.o. daily.  4.  Vicodin 5/500 mg one tablet p.o. q.6h. p.r.n.  5.  Atenolol 50 mg p.o. daily.  6.  Norvasc 5 mg p.o. daily.  7.  Plavix 75 mg p.o. daily.  8.  Lantus 50 units subcutaneously q.p.m. and 100 units subcutaneously      q.a.m.  9.  She is now on Lovenox 40 mg subcutaneously q.24h.  10. Aspirin 81 mg p.o. daily.  11. Protonix 40 mg p.o. daily.   PAST MEDICAL HISTORY:  1.  Coronary artery disease, status post multiple percutaneous interventions      and coronary artery bypass grafting in March of 2003, including a LIMA      to the left anterior descending, saphenous vein graft to the ramus      intermedius, and saphenous vein graft to the right coronary artery.      Most recent angiography was in February of  2006 showing native vessel      disease, including an occluded distal circumflex, occluded distal right      coronary artery, and diffusely diseased mid to distal left anterior      descending.  The LIMA to the left anterior descending was patent, the      saphenous vein graft to the right coronary artery was patent and mildly      diseased, and the saphenous vein graft to the ramus intermedius was      occluded, with findings of only 50% stenosis within the ramus      intermedius proper.  Left ventriculography at that time revealed an      ejection fraction of 55%, and aortic root injection showed stable      Hemashield graft without aortic insufficiency.  Medical therapy was      planned at that time.  Of note, the patient did have a Cardiolite      preceding this angiogram which was abnormal in the areas of potential      ischemia that were verified anatomically.  2.  History of ascending aortic aneurysm, status post Hemashield graft      repair in March of 2003.  3.  Hypertension.  4.  Hyperlipidemia.  5.  Type 2 diabetes  mellitus.  6.  Hypothyroidism.  7.  Reported history of cholelithiasis.  8.  Gastroparesis.  9.  History of endometriosis.  10. Status post abdominal hysterectomy and bilateral salpingo-oophorectomy.  11. Status post previous breast surgery.   SOCIAL HISTORY:  The patient lives in [place] by herself.  She denies any  ongoing tobacco or alcohol use.  She is divorced and has two children.  She  previously worked for [employer].   FAMILY HISTORY:  Significant for breast and colon cancer in three of the  patient's sisters.  The patient's mother died of cancer at age 19, and her  father died of heart-related problems at age 51.   REVIEW OF SYSTEMS:  As described in the history of present illness.  Otherwise, negative.   PHYSICAL EXAMINATION:  VITAL SIGNS:  Temperature 98.8 degrees, heart rate 74  and regular, respirations 18, blood pressure 131/79, oxygen saturation is  95% on room air.  GENERAL:  This is a well-nourished elderly woman in no acute distress,  denying active chest pain.  HEENT:  Conjunctivae and lids normal.  Oropharynx is clear.  NECK:  Supple without elevated jugular venous pressure.  No carotid bruits.  No thyromegaly is noted.  LUNGS:  Clear with diminished breath sounds.  No rales or wheezing noted.  CARDIAC:  Regular rate and rhythm with a soft basal systolic murmur.  No  pericardial rub.  No S3 gallop.  ABDOMEN:  Soft and nontender to palpation.  Bowel sounds are present.  No  loud bruit is noted.  EXTREMITIES:  No frank pitting edema.  Peripheral pulses are 1-2+.  SKIN:  No ulcerative changes noted.  MUSCULOSKELETAL:  No kyphosis is noted.  NEUROPSYCHIATRIC:  The patient is alert and oriented x3.   LABORATORY DATA:  WBCs 10.3, hemoglobin 14.3, hematocrit 42.0, platelet  count 333.  INR 1.0.  Sodium 139, potassium 4.2, chloride 109, glucose 63,  BUN 28, creatinine 1.0.  Liver function tests are normal.  Amylase and lipase are normal.  Troponin I 0.02 on two  occasions, with CK-MB of 1.7, BNP  56.  Urinalysis with negative nitrites, large amount of leukocytes, many  squamous epithelial cells suggesting contamination.  Chest x-ray report is pending.   Brain MRI from February 10, 2005 reports no acute stroke.  There is atrophy  noted with multiple lacunes along with areas of ischemic demyelination felt  to represent a combination of longstanding hypertension and diabetes  associated with small vessel disease.   IMPRESSION:  1.  Chest pain syndrome, progressive since late December of 2006.  Cardiac      markers overall are reassuring, and electrocardiogram shows no acute      changes compared to old tracings.  2.  Known history of multivessel coronary artery disease, as discussed      above.  Last angiography was in February of 2006, and ongoing plan has      been medical therapy to date.  3.  History of ascending aortic aneurysm, status post Hemashield graft      repair in March of 2003, stable by angiogram in February of 2006.  4.  Hypertension.  5.  Hyperlipidemia, with INTOLERANCE TO LIPITOR.  6.  Type 2 diabetes mellitus.  7.  DOCUMENTED INTRAVENOUS CONTRAST ALLERGY.  8.  Recent magnetic resonance imaging suggesting no acute stroke but      evidence of small vessel disease, atrophy, and multiple lacunes.  9.  History of cholelithiasis.  Liver function tests, amylase, and lipase      are normal at this time.   RECOMMENDATIONS:  1.  I discussed the situation with Dr. [name] by phone.  Plan at this point      will be to continue medical therapy and proceed with an adenosine      Myoview study as an inpatient.  I suspect that this will be abnormal;      however, if no new areas of ischemia are identified and/or there has not      been progression in degree of ischemia, this may be useful in helping to      determine whether continuing medical therapy will be pursued versus      considering repeat invasive testing.  2.  Follow up on  chest x-ray.  3.  Further plans to follow.           ______________________________  Jonelle Sidle, M.D. [facility]     SGM/MEDQ  D:  02/10/2005  T:  02/10/2005  Job:  [id number]   cc:   Weyman Pedro, M.D.  [place], [place]   Maisie Fus D. [name], M.D. [facility]  [address]  [address]  [place]  [place] [address]

## 2010-06-07 NOTE — H&P (Signed)
Cathy [name], Cathy [name]                          ACCOUNT NO.:  [id number]   MEDICAL RECORD NO.:  [id number]                   PATIENT TYPE:  INP   LOCATION:  1823                                 FACILITY:  [facility]   PHYSICIAN:  Greggory Stallion L. Pernell Dupre, M.D.               DATE OF BIRTH:  02/27/1930   DATE OF ADMISSION:  04/23/2003  DATE OF DISCHARGE:                                HISTORY & PHYSICAL   PRIMARY CARDIOLOGIST:  Arturo Morton. [name], M.D.   CHIEF COMPLAINT:  Chest pain.   HISTORY OF PRESENT ILLNESS:  This 77 year old white female with a history of  coronary artery disease (last cardiac catheterization in October 2003, a  coronary artery bypass graft surgery in April 2003), diabetes,  hypothyroidism, hypertension and hyperlipidemia, who presents with chest  pain.  The patient states that her problems started on Wednesday, when she  was laying on the couch.  She developed diaphoresis and went to get her hair  cut and felt nauseated on the way home while she was driving.  The patient  states that on Saturday she developed chest pain at rest located under her  left breast, mid-sternal and lateral breast, burning in nature.  She took  Darvocet and it went away after 30 minutes.  The pain not associated with  eating or palpation over the area, and can come on at any time. Associated  with the pain is nausea, diaphoresis and shortness of breath.  The patient  states the pain is the same as before.  A heart catheterization and surgery.  The patient states she has been compliant with mediations.  No PND or  orthopnea.  Occasional lower extremity edema.  Most recent episode of chest  pain was this a.m., relieved with nitroglycerin in the emergency room.   REVIEW OF SYSTEMS:  Positive for diaphoresis, urinary frequency, diarrhea  and general arthritic pains.   PAST MEDICAL HISTORY:  1. Coronary artery disease:  On May 05, 2001, had a coronary artery bypass     graft surgery limited to the  LAD, SVG to the OM, SVG to the RCA.  The     last catheterization October 2003, with a normal ejection fraction, SVG     to the OM was occluded, SVG to the RCA 50% tapered narrowing, and the     LIMA to the LAD with diffuse disease.  At this time it was decided for     her to undergo medical management, even though she had a nuclear stress     test on April 14, 2002, with an ejection fraction of 54%, showing     inferior and inferolateral ischemia.  2. Thoracic aneurysm:  A 5.0 cm ascending, 32 mm Hemashield straight graft     was placed on May 05, 2001.  3. Diabetes.  4. Status post hysterectomy.  5. Partial SBO.  6. Gastroesophageal reflux disease.  7. Hypertension.  8. Hypothyroidism.  9. Anemia.  10.      Arthritis.  11.      Hyperlipidemia.   SOCIAL HISTORY:  No tobacco, alcohol, or IV drugs.   FAMILY HISTORY:  Positive for coronary artery disease on her father's side.  Positive for diabetes on her mother's side and her father's side.   ALLERGIES:  IODINE causes anaphylaxis.  She is allergic to [facility] AND  LIPITOR.   CURRENT MEDICATIONS:  1. Prozac 20 mg daily.  2. Synthroid 112 mcg, 1/2 tab daily.  3. Norvasc 5 mg daily.  4. Insulin, unsure exactly what type.  Per the patient 70 units q.a.m., 60     units q.p.m.  5. Aspirin 325 mg daily.  6. K-Dur 1500 mg on Monday and Thursday.  7. Colace 200 mg daily.  8. Altace 5 mg daily.  9. Atenolol 75 mg daily.  10.      Lopid 600 mg b.i.d.  11.      Plavix 75 mg daily.  12.      Protonix 40 mg daily.  13.      Glucophage 500 mg b.i.d.  14.      Demadex 20 mg daily.  15.      Calcium 600 mg daily.  16.      Lorazepam 1 mg daily.   PHYSICAL EXAMINATION:  GENERAL:  Alert and oriented x3, in no apparent  distress.  VITAL SIGNS:  Blood pressure 114/59, pulse 85, respirations 20, saturation  98% on room air.  HEENT/NECK:  No increased JVP, no carotid bruits, no thyromegaly.  Pupils  equal, round, reactive to light.   Extraocular movements intact.  NODES:  No lymphadenopathy.  CHEST:  Coarse breath sounds in the left lower lung base.  CARDIOVASCULAR:  Normal S1, S2.  No murmurs, rubs, or gallops.  ABDOMEN:  Soft, nontender, nondistended.  Positive bowel sounds, obese.  Negative hepatojugular reflux.  EXTREMITIES:  No clubbing, cyanosis, or edema.  Strong  radial and femoral  pulses and pedal pulses.   Electrocardiogram:  Normal sinus rhythm at 85, PR 146, QRS 104, QTC of 491,  poor R-wave progression, approximately 1.0 mm ST depression in the apical  leads, V5 and V6, and T-wave inversions in the high lateral lead I and aVL.   Chest x-ray:  Cardiomegaly with questionable infiltrate in the left lower  lung base.   LABORATORY DATA:  Sodium 139, potassium 4.2, BUN 20, creatinine 0.7, glucose  108.  White count elevated at 12, H&H 14/42, platelet count 352, MCV 87.  Magnesium normal at 2.1.  Coags normal with a PT of 13.6, INR 1.1, PTT 36.  First set of enzymes:  CK-MB 1.7, troponin less than 0.05.   ASSESSMENT:  A 77 year old white female with a history of coronary artery  disease, diabetes, hypertension, hyperlipidemia and hypothyroidism, presents  with chest pain, nausea and diaphoresis.   PLAN:  1. For the chest pain, rule out with cardiac enzymes and electrocardiogram.     Continue aspirin, Plavix, beta blocker and ACE inhibitor.  The patient is     currently now on a statin because of an allergy while in care.  She will     be placed on heparin.  Awaiting laboratory results.  Review the cardiac     catheterization films.  A patient with a recent adenosine Cardiolite.     The decision made to medically manage this SVG to RCA stenosis.  Will     defer to  Dr. Arturo Morton. Stuckey's opinion.  2. For the diabetes:  Obtain an A1c, placement on sliding scale insulin.     Hold Metformin, hold insulin because the patient is not sure of the type     or dose. 3. For the hypertension:  Continue the beta  blocker, ACE inhibitor and     Norvasc.  4. For the hyperlipidemia:  Check a lipid panel.  Continue Lopid.  5. For the hypothyroidism:  Continue Synthroid.  Check a TSH and free T4.  6. For anxiety/depression:  Continue Prozac 20 mg daily, lorazepam 1 mg     daily.  7. For the gastroesophageal reflux disease:  Continue Protonix 40 mg daily.  8. For a questionable left lower lobe infiltrate:  Will order a PA and     lateral chest x-ray and a urinalysis.                                                Greggory Stallion L. Pernell Dupre, M.D.    Loralie Champagne  D:  04/23/2003  T:  04/24/2003  Job:  [id number]

## 2010-06-07 NOTE — Cardiovascular Report (Signed)
[place]  Patient:    Cathy [name], Cathy [name] Visit Number: [id number] MRN: [id number]          Service Type: CAT Location: [id number] Attending Physician:  Ronaldo Miyamoto Proc. Date: 02/18/00 Admit Date:  02/18/2000   CC:         Cardiac Catheterization Laboratory             Elvina Sidle, M.D.                        Cardiac Catheterization  PROCEDURE:  Selective coronary arteriography.  CARDIOLOGIST:  Arturo Morton. [name], M.D.  INDICATIONS:  Ms. Krah is a delightful 77 year old white female who presents with feeling poorly.  A recent Cardiolyte suggested ischemia in the basal lateral segment.  The patient has a known chronic occlusion of the circumflex coronary artery.  In 1998, she underwent percutaneous angioplasty of the mid-right coronary artery and stenting of the LAD.  In January 2001, she developed unstable angina, and had a subtotal occlusion of the circumflex. She had a contrast reaction with anaphylaxis.  She was eventually successfully treated.  She then developed in-stent restenosis and had a repeat dilatation done in October 2001.  She now presents with recurrent feeling moderately poorly, but not specific angina.  The current study was done to reassess coronary anatomy.  DESCRIPTION OF PROCEDURE:  The procedure was performed from the right femoral artery.  She tolerated the procedure without complications.  I brought Dr. Rollene Rotunda into the room to review the studies.  I eventually reviewed the studies also with Dr. Francisca December.  The consensus opinion that continued medical management would be warranted.  She was taken to the holding area in satisfactory clinical condition for further management.  HEMODYNAMIC DATA: Central aortic pressure:  157/92.  ANGIOGRAPHIC DATA: 1. Left main coronary artery:  The left main coronary artery is free of    critical disease. 2. Left anterior descending coronary artery:   The LAD courses to the apex    and wraps the apical tip.  In the proximal segment prior to the septal    perforator there is evidence of a stent.  There is mild narrowing of    the stent.  This does not exceed 30% luminal reduction, and it appears    to be widely patent.  This is from 56.  There is some mild irregularity    just prior to the origin of the diagonal branch, which itself has some    mild lumen irregularity.  In the midportion of the distal vessel is a    focal stenosis of about 50%, but is hypodense in the LAO view, but    appears smooth in other views.  At the apical tip there is a 90% stenosis    just prior to the area of the vessel that wraps the apical tip, and this    is unchanged from the prior study. 3. Circumflex coronary artery:  The circumflex artery provides a large    intermediate vessel, as well as an AV circumflex.  The AV circumflex    is totally occluded, and this is chronic.  The vessel fills by retrograde    collaterals from the circumflex marginal.  The circumflex marginal    demonstrates evidence of a stent, and this is the site of previous    percutaneous stenting and subsequent repeat dilatation.  In the very    proximal  portion of the stent over a 2.0 to 3.0 mm area there is about    50% narrowing.  There was general consensus as to the degree of narrowing.    The remainder of the stent appeared to be widely patent, and this did    not appear to be diffuse in-stent restenosis.  Distal to this point is    an area of narrowing in a big bend portion of the artery of about 50%,    and there is another 50% to 70% at most smooth stenosis distally prior    to the origin of the large bifurcating marginal branches. 4. Right coronary artery:  The right coronary artery has a marked anterior    takeoff as on previous studies.  There is some diffuse luminal irregularity    throughout with about 30% narrowing at the previous stent site in the    midvessel.  There  does not appear to be high-grade occlusion or high-grade    stenosis anywhere, but there is diffuse irregularity of the right coronary    artery, as previously noted.  CONCLUSIONS: 1. No evidence of in-stent restenosis at the previous left anterior descending    coronary artery site. 2. No evidence of restenosis at the previous angioplasty site in the mid-    right coronary artery. 3. Mild partial restenosis of the previous stent site in the large ramus    intermedius vessel, without critical narrowing.  DISPOSITION:  We have general consensus as to the severity of the lesion.  She is out more than three months now from the procedure, and the vessel appears to be relatively smooth, with good flow and with no more than about 50% narrowing at the stent site.  Based upon this, and based upon the previous dilatations, we elected today not to redilate or provide radiation therapy. One potential downside of radiation also in her case is the fact that the radiation catheter would have to go around a steep bend in the intermediate vessel, although I think this could be managed if necessary.  Nonetheless, this stenosis is very short and focal.  I would elect at the present time to watch and monitor this, as this does not appear to be severe.  She might be a reasonable candidate for the restenosis arm of _____, should she develop further problems down the road, but at the present time, it was the consensus opinion not to intervene on the vessel.  Since the ischemia appeared to be localized predominantly to the basal segment of the lateral wall, it was felt that medical management, focusing on her diabetes mellitus, would be the best option.  Attending Physician:  Ronaldo Miyamoto DD:  02/18/00 TD:  02/19/00 Job: [id number]

## 2010-06-07 NOTE — Cardiovascular Report (Signed)
Cathy [name], HARVIE NO.:  [id number]   MEDICAL RECORD NO.:  [id number]          PATIENT TYPE:  INP   LOCATION:  4731                         FACILITY:  [facility]   PHYSICIAN:  Charlies Constable, M.D. [facility] DATE OF BIRTH:  05-08-1930   DATE OF PROCEDURE:  03/04/2004  DATE OF DISCHARGE:                              CARDIAC CATHETERIZATION   CLINICAL HISTORY:  Cathy [name] is 77 years old and in 2003 had bypass surgery  and had a Hemashield graft placed in the aorta and had LIMA to the LAD and  vein graft to the ramus and distal right coronary artery.  She recently was  admitted to [facility] with chest pain and transferred here for  evaluation with angiography.   PROCEDURE:  The procedure is performed via the right femoral artery using an  arterial sheath and 6 French preformed coronary catheters.  A femoral  arterial puncture was performed and Omnipaque contrast was used.  A LIMA  catheter was used for injecting the LIMA graft.  A right bypass graft  catheter was used for injection of the vein graft to the right coronary  artery.  An aortic root injection was performed to evaluate the Hemashield  graft.  We were unable to close the right femoral artery due to a stick  below the bifurcation.  The patient tolerated the procedure well and left  the laboratory in satisfactory condition.   RESULTS:  Left main coronary artery.  The left main coronary artery was free  of significant disease.   Left anterior descending artery.  The left anterior descending artery gave  rise to a septal perforator and a diagonal branch then had competing flow  distally with the LIMA.  There was 0% stenosis at the stent site in the  proximal LAD and there was a 60% stenosis in the proximal to mid LAD.   Left circumflex artery.  The left circumflex artery gave rise to a large  ramus branch and an AV branch.  The AV branch was completely occluded in its  midportion and the distal AV branch  filled via collaterals from the ramus  branch and also the distal right coronary artery.  The ramus branch had a  50% stenosis in the midportion and the distal portion of the graft could be  seen filling retrograde.  This graft was completely occluded and this was  only a very short segment.   Right coronary artery.  The right coronary artery gave rise to two right  ventricular branches then was completely occluded.   The vein graft in the distal right coronary artery was patent and supplied a  posterior descending and two posterolateral branches.  The first  posterolateral branch was completely occluded and filled via collaterals.  There was 30% narrowing in the ostium of the vein graft to the right  coronary artery.   The vein graft to the ramus branch of the circumflex artery was completely  occluded at its origin.  Both gray vein grafts arose from the Hemashield  graft.   The LIMA graft to the LAD was patent  and functioned normally.  There was 80  and 90% stenosis in the distal LAD.   LEFT VENTRICULOGRAM:  Left ventriculogram performed in the RAO projection  showed hypokinesis of the mid inferior wall.  The overall wall motion was  good with an estimated ejection fraction of 55%.   AORTIC ROOT INJECTION:  Aortic root injection showed the Hemashield graft to  be seated well.  There was no aortic insufficiency.   CONCLUSION:  1.  Coronary artery disease status post prior coronary bypass graft surgery      and status post placement of a Hemashield aortic graft.  2.  Severe native vessel disease with 0% stenosis at the stent site in the      proximal left anterior descending, 60% proximal and mid stenosis in the      left anterior descending, total occlusion of the mid circumflex artery      with 50% stenosis in the ramus branch, and total occlusion of the distal      right coronary artery.  3.  Patent vein graft to the distal right coronary artery with 30% stenosis      at the  ostium, occluded vein graft to the ramus branch of the circumflex      artery, and patent left internal mammary artery graft to the left      anterior descending with 80-90% distal disease in the left anterior      descending.  4.  Inferior wall hypokinesis with an estimated ejection fraction of 55%.   RECOMMENDATIONS:  The patient's anatomy does not appear to have changed much  since the last catheterization in 2003.  She does have potential source of  ischemia in the distal circumflex artery, the posterolateral branch of the  right coronary artery, and the distal left anterior descending.  Will plan  continued medical management.      BB/MEDQ  D:  03/04/2004  T:  03/04/2004  Job:  [id number]   cc:   Cathy [name], M.D. [facility]   Cathy [name], M.D.  [address]  [place], [place] [address]   [facility]  [address]  [place], [place] [address]   Cardiopulmonary

## 2010-06-07 NOTE — Discharge Summary (Signed)
[place]. [facility]  Patient:    Cathy, [name]                       MRN: [id number] Adm. Date:  [id number] Disc. Date: 11/13/99 Attending:  Ronaldo Miyamoto Dictator:   Tereso Newcomer, P.A.-C. CC:         Dr. Milus Glazier, Phone Number [phone]   Discharge Summary  DATE OF BIRTH:  12-11-1930  PROCEDURES PERFORMED THIS ADMISSION: 1. Cardiac catheterization by Dr. Shawnie Pons on November 13, 1999,    revealing no restenosis of previous PCI to RCA and LAD scattered disease,    restenosis of circumflex stent at 80%. 2. Successful PTCA with cutting balloon of proximal circumflex lesion with    obstruction and stenosis of 80% to less than 20%.  EF was 67.8%.  DISCHARGE DIAGNOSES: 1. Coronary artery disease. 2. Diabetes. 3. Gastroesophageal reflux disease. 4. Hypothyroidism. 5. Degenerative joint disease. 6. Status post hysterectomy. 7. Status post bladder tack.  ADMISSION HISTORY:  The patient was seen in the office on November 07, 1999, for follow up of recent abnormal stress Cardiolite.  Her cardiac history prior to this admission was notable for stent to LAD and PTCA of the RCA in 1998, with 100% obtuse marginal occlusion at that time and acute MI requiring stenting of a high grade OM lesion in January of 2001.  The patient had severe anaphylaxis prior to the procedure related to contrast dye, requiring placement of IABP, IV steroids, and multiple shocks.  The patient noted a diminished energy level recently and had to discontinue her cardiac rehab program.  She was set up for an exercise Cardiolite.  During the test, she noted some dyspnea, but no chest pain.  She achieved 92% of her predicted maximal heart rate.  She had inferolateral ST depression noted at a peak exercise and perfusion images revealed lateral wall perfusion defect with mixed infarction/ischemia, and preserved LV systolic function.  EF is 61%.  ALLERGIES:  CONTRAST  DYE.  PHYSICAL EXAMINATION:  Initial physical exam in the office with blood pressure of 128/82, pulse 60.  Neck without JVD or bruits.  Lungs are clear to auscultation bilaterally.  Heart regular rate and rhythm, S1 and S2, 2/6 holosystolic murmur along the left sternal border.  The abdomen was soft and nontender.  Extremities with no significant edema.  HOSPITAL COURSE:  It was felt the patient should be admitted as an outpatient for diagnostic coronary angiography and/or percutaneous intervention.  She would need appropriate dye prophylaxis, and this was given.  Her ______ was held.  She went for cardiac catheterization on November 13, 1999, by Dr. Shawnie Pons as noted above.  She had no immediate complications.  She was noted to have a slight increase in her MB post cath.  Total CK was 204.  CKMB was 11.4, and index was 5.6.  However, it was felt she was stable enough for discharge later that afternoon.  Her lipid profile was checked.  Total cholesterol was 276, triglycerides were 446, HDL was 54, and LDL was not calculated due to the hypertriglyceridemia.  It was felt that the patient could have her lipid profile rechecked as an outpatient and the appropriate therapy given at that time.  Other labs with white count of 16,000, hemoglobin 11.9, hematocrit 34.2, and platelet count 187,000.  INR 1.0.  Sodium 139, potassium 4.4, chloride 105, CO2 24, glucose 305, BUN 14, creatinine 1.1.  The patients  fingerstick blood sugar was checked prior to discharge and this was 175.  The patient ambulated well with cardiac rehab without difficulty. Therefore, on the afternoon of November 13, 1999, after the patients Integrilin and hydration was complete, it was felt she was stable enough for discharge to home.  DISCHARGE MEDICATIONS: 1. ______ 2.5/500 b.i.d. to be restarted on Friday, November 15, 1999. 2. Synthroid 0.112 mg q.d. 3. Coated aspirin 325 mg q.d. 4. Lasix 20 mg three times a  week. 5. Lopressor 50 mg one-half tablet p.o. b.i.d. 6. Vioxx q.d. 7. Prozac as before. 8. Nitroglycerin 0.4 mg sublingual p.r.n. chest pain.  ACTIVITY:  No driving, sexual, or strenuous activity for two days.  DIET:  Low fat diabetic diet.  WOUND CARE:  The patient should watch for bleeding, swelling, or drainage at the cath site, and call our office with concerns.  FOLLOWUP:  Fasting lipid profile should be checked on the week of her follow up appointment.  She is to see the P.A. for Dr. [name], Mr. Dian Queen, P.A.-C. on November 28, 1999, at 9:15 in the morning.  She is to follow up with Dr. Milus Glazier as needed. DD:  11/13/99 TD:  11/13/99 Job: [id number]

## 2010-06-07 NOTE — Discharge Summary (Signed)
[place]. [facility]  Patient:    Cathy [name], Cathy [name] Visit Number: [id number] MRN: [id number]          Service Type: CAT Location: 6500 [id number] 01 Attending Physician:  Ronaldo Miyamoto Dictated by:   Tereso Newcomer, P.A. Admit Date:  02/18/2000 Disc. Date: 02/19/00   CC:         Elvina Sidle, M.D.                           Discharge Summary  DATE OF BIRTH:  01/17/1931  DISCHARGE DIAGNOSES:  1. Coronary artery disease.  2. Cardiac catheterization this admission.  See below.  3. History of percutaneous transluminal coronary angioplasty and stent to the     left anterior descending and percutaneous transluminal coronary     angioplasty of the right coronary artery in 1998.  4. Status post acute myocardial infarction in January 2001 with totally     occluded obtuse marginal requiring stent.  Cardiac catheterization October     2001 showing preserved left ventricular function, no evidence of     restenosis at previous stent to the left anterior descending or of the     percutaneous transluminal coronary angioplasty site in the right coronary     artery.  There was incident restenosis of a short focal area circumflex     that was angioplastied.  5. Profound anaphylactic shock secondary to intravenous contrast January     2001.  6. Hyperlipidemia.  7. Non-insulin-dependent diabetes mellitus.  8. Hypertension.  9. Hypothyroidism. 10. Gastroesophageal reflux disease. 11. Degenerative disk disease. 12. Status post back surgery x 2. 13. Status post hysterectomy. 14. Status post bladder tack.  PROCEDURE:  Cardiac catheterization by Dr. Shawnie Pons on February 18, 2000. Please see the dictated catheterization report for complete details.  There was about 50% focal stenosis in the proximal stent of the circumflex and less than 30% stenosis at the stent site in the left anterior descending.  The findings were reviewed with colleagues and  conservative therapy was favored. Decision was to focus on more aggressive diabetic management.  HISTORY:  This 77 year old female was seen in the office on February 13, 2000. In a previous office visit she had complained of increasing fatigue and dyspnea on exertion for several months.  At that office visit Cardiolite was scheduled.  She stated that she was unable to do even simple tasks or walk 300 yards without getting short of breath and fatigued.  She also noticed some left-sided substernal chest pain.  They occurred more frequently when she is lying on her left side and when she is stressed.  Patient underwent stress Cardiolite on February 11, 2000 that showed diagnostic electrocardiographic changes and a prior lateral peri-infarct ischemia and an EF of 52%.  ALLERGIES:  IV DYE.  PHYSICAL EXAMINATION:  GENERAL:  Female.  VITAL SIGNS:  Blood pressure 110/70, weight 180 pounds.  NECK:  No JVD or bruits.  CHEST:  Clear to auscultation.  CARDIAC:  Regular rate and rhythm.  A 1-2/6 systolic ejection murmur at left upper sternal border.  ABDOMEN:  Soft, nontender.  EXTREMITIES:  Without edema.  LABORATORIES:  EKG:  Sinus rhythm with nonspecific ST-T wave abnormality.  No significant change since prior EKG.  HOSPITAL COURSE:  The patient was brought into the hospital on February 18, 2000 for elective cardiac catheterization due to the combination of her symptoms and abnormal Cardiolite.  She  was premedicated for her catheterization.  She underwent the procedure by Dr. [name] on February 18, 2000.  The results are noted above.  On the morning of February 19, 2000 it was felt she was in stable condition.  A slight ooze was noted from her groin site.  It was felt the patient could probably be discharged later in the afternoon on February 19, 2000 as long as she remained stable without further bleeding from her groin.  It was felt that the more aggressive diabetic management should  be pursued with this patient.  DISCHARGE MEDICATIONS:  1. Synthroid 0.112 mg q.d.  2. Calcium and vitamin C q.d.  3. Lopressor 50 mg one-half tablet b.i.d.  4. Multivitamin q.d.  5. Norvasc 2.5 mg q.d.  6. Nexium 40 mg p.r.n.  7. Gemfibrozil 600 mg b.i.d.  8. Lipitor 10 mg q.h.s.  9. Glucovance 2.5/500 mg b.i.d.  This medication is to be restarted on     February 21, 2000. 10. Lasix 20 mg on Mondays, Wednesdays, and Fridays. 11. Vioxx p.r.n. 12. Prozac 90 mg one q.week. 13. Coated aspirin 325 mg q.d. 14. Nitroglycerin 0.4 mg sublingual p.r.n. chest pain.  ACTIVITY:  No driving, heavy lifting, or exertion for three days.  DIET:  Low fat, low sodium strict diabetic diet.  INSTRUCTIONS:  She is to call our office for any groin swelling, bleeding, or bruising.  She has been advised to restart her Glucovance on Friday, February 21, 2000.  She is not to take this medication until then.  She is to see Dr. [name] on February 26, 2000 at 10:30 a.m. Dictated by:   Tereso Newcomer, P.A. Attending Physician:  Ronaldo Miyamoto DD:  02/19/00 TD:  02/19/00 Job: [id number]

## 2010-06-07 NOTE — Discharge Summary (Signed)
[name]. [facility]  Patient:    Cathy [name], Cathy [name] Visit Number: [id number] MRN: [id number]          Service Type: MED Location: CCUB 2912 01 Attending Physician:  Talitha Givens Dictated by:   Delton See, P.A. Admit Date:  11/11/2000 Discharge Date: 11/18/2000   CC:         Jonelle Sidle, M.D. at [facility] in [place], M.D. in [facility]   Referring Physician Discharge [place]  DATE OF BIRTH:  17-Sep-1930  BRIEF HISTORY:  This is a 77 year old female with a history of coronary artery disease who is status post recent intervention September 10, 2000 performed by Dr. Chales Abrahams, at which time the patient had a 95% restenosed proximal RCA lesion treated with percutaneous intervention - the details are unknown at this time. She has a history of diabetes mellitus, hypertriglyceridemia, hypothyroidism, and depression.  She was seen in the office on November 11, 2000 at [facility] in [place] for evaluation of chest pain.  She was transferred to [facility] and admitted on November 11, 2000 for further evaluation and treatment.  PAST MEDICAL HISTORY:  Please see above.  She has diabetes mellitus, a history of coronary artery disease with previous interventions to the LAD and RCA. She has elevated lipids, treated hypothyroidism, depression, history of peptic ulcer disease.  ALLERGIES:  CONTRAST DYE.  HOSPITAL COURSE:  As noted, this patient was admitted to [facility] for further evaluation of her coronary artery disease.  She underwent cardiac catheterization on November 12, 2000.  She was found to have a 90% proximal RCA and a 100% occluded circumflex.  It was felt the patient had severe two-vessel coronary disease with normal LV function and aggressive restenosis of her RCA. Percutaneous intervention and brachytherapy was recommended versus CABG. These options were discussed with the patient and a decision was  made to proceed with percutaneous intervention and brachytherapy.  On November 17, 2000 the patient was taken back to the catheterization laboratory for PTCA of the RCA, reducing the lesion from 90% to 30%; for the in-stent restenosis she received brachytherapy.  It was recommended the patient stay on Plavix for at least one year.  It was felt that percutaneous intervention of the circumflex could be considered in two to three months when the drug-coated stents were available.  The patient was discharged on November 18, 2000 in improved condition.  LABORATORY DATA:  The patient had mildly elevated enzymes on October 25 - her CK was 194, MB 12.8, index 6.6, troponin 2.5.  The enzymes peaked on that day. The CBC on October 29 was within normal limits.  Chemistries were essentially unremarkable except for elevated glucose levels in the 250 range.  Chest x-ray showed mild cardiomegaly.  EKG showed sinus bradycardia with nonspecific T wave abnormalities, rate 57 beats per minute.  DISCHARGE MEDICATIONS:  1. Plavix 75 mg daily - she is to be on this medication for at least a year.  2. Tricor 160 mg daily.  3. Lipitor 40 mg daily.  4. Nexium 40 mg each evening.  5. Coated aspirin 325 mg daily.  6. Vioxx 25 mg daily.  7. Prozac 20 mg daily.  8. levothyroxine 112 mcg daily.  9. K-Dur 10 mEq daily. 10. Vasotec 2.5 mg b.i.d. 11. Lopressor 50 mg b.i.d. 12. Glucotrol 5 mg b.i.d. 13. Nitroglycerin p.r.n. for chest pain. 14. Demadex 20 mg as needed.  ACTIVITY:  The  patient was told to avoid any strenuous activity for at least two days.  DIET:  She was to be on a low salt/low fat/low cholesterol 1800 calorie diabetic diet.  WOUND CARE:  She was told to call the office if she had any problems with her catheterization site.  FOLLOW-UP:  She was to follow up with Dr. [name] November 21 at 2 p.m.  PROBLEM LIST AT TIME OF DISCHARGE:  1. Non-Q-wave myocardial infarction.  2. Status post  percutaneous transluminal coronary angioplasty for in-stent     restenosis of the right coronary artery, treated with brachytherapy     this admission.  Ejection fraction 55%.  Residual 90% distal left     anterior descending artery and 100% circumflex artery.  3. Previous history of coronary artery disease with multiple interventions     as noted.  4. Diabetes mellitus.  5. Elevated lipids.  6. History of depression.  7. History of CONTRAST DYE allergy.  8. Remote peptic ulcer disease. Dictated by:   Delton See, P.A. Attending Physician:  Talitha Givens DD:  11/18/00 TD:  11/18/00 Job: [id number]

## 2010-06-07 NOTE — Discharge Summary (Signed)
[place]. [facility]  Patient:    Cathy [name], Cathy [name] Visit Number: [id number] MRN: [id number]          Service Type: MED Location: 2000 2011 01 Attending Physician:  Nelta Numbers Dictated by:   Guy Franco, P.A. [facility] Admit Date:  03/27/2001 Disc. Date: 04/05/01   CC:         Arturo Morton. [name], M.D. [facility]  Elvina Sidle, M.D.  Salvatore Decent. Cornelius Moras, M.D.   Referring Physician Discharge [place]  DISCHARGE DIAGNOSES:  1. Coronary artery disease, medical treatment.  2. Partial small bowel obstruction, resolved.  3. Anemia, hemoglobin stable.  4. A 4.5 cm ascending aortic aneurysm confirmed with chest magnetic resonance     imaging on April 02, 2001.  5. Intravenous contrast allergy.  6. Hypertension.  7. Diabetes mellitus.  8. Hyperlipidemia, treated.  9. Hypothyroidism, treated. 10. Gastroesophageal reflux disease. 11. Status post hysterectomy.  ALLERGIES:  IV CONTRAST.  HISTORY OF PRESENT ILLNESS:  Cathy [name] is a 77 year old female who has known coronary artery disease.  She was admitted on March 27, 2001, with progressive and worsening substernal chest pressure.  LABORATORY DATA:  Her EKG revealed normal sinus rhythm with nonspecific ST-T wave changes.  Subsequent laboratory studies revealed cardiac isoenzymes and troponins negative.  HOSPITAL COURSE:  With her known underlying coronary artery disease, she was taken to the cardiac catheterization laboratory on March 29, 2001, by Arturo Morton. [name], M.D., and she was found to have preserved LV systolic function, continued patency of the stent to the LAD, continued patency of the stent to the ramus intermedius, and continued patency of the PTCA site of the mid right coronary artery.  There was a partial restenosis of the proximally placed stent across the proximal right coronary artery bend.  There was also known a total old occlusion of the AV circumflex.  There was diffuse apical  diabetic disease involving the apical tip of the LAD and PDA.  With these findings, Arturo Morton. [name], M.D., felt that the patient would best benefit for aggressive medication treatment.  During the rest of the hospital stay, [name] developed acute abdominal pain and she was found to have a partial small bowel obstruction which was followed by our GI service, Hedwig Morton. Juanda Chance, M.D.  This spontaneously resolved without any surgical intervention.  In addition, during the cardiac catheterization, it was noted that [name] aortic root was dilated and an MRI of her chest was obtained, which revealed a 4.5 cm aneurysmal dilatation of the ascending aorta with no evidence of dissection.  Laboratory studies during her hospital stay included a urinalysis which revealed Klebsiella pneumoniae and Enterococcus species susceptible to Augmentin.  Sodium 143, potassium 4.1, BUN 9, creatinine 0.9. Hemoglobin 12.5, hematocrit 36.4, platelets 180, white count 8.7.  DISCHARGE MEDICATIONS:  1. Enteric-coated aspirin 325 mg one p.o. q.d.  2. Plavix 75 mg one p.o. q.d.  3. Lipitor 40 mg one p.o. q.d.  4. Toprol XL 25 mg one p.o. q.d.  5. Aceon 4 mg a day.  6. Synthroid 112 mcg one p.o. q.d.  7. Glucotrol XL 5 mg one p.o. q.d.  8. Prozac 20 mg one p.o. q.h.s.  9. Protonix 40 mg one p.o. q.d. 10. Bextra 10 mg one p.o. q.d. 11. Ativan p.r.n. 12. Norvasc 2.5 mg a day (new). 13. Imdur 30 mg one p.o. q.d. (new). 14. Augmentin 500 mg one p.o. b.i.d. for seven days for urinary tract     infection (new).  ACTIVITY:  No strenuous activity.  DIET:  Remain on a low-fat diet.  WOUND CARE:  Clean over catheterization site with soap and water.  Call for any concerns or questions.  FOLLOW-UP:  She has an appointment to follow up with Salvatore Decent. Cornelius Moras, M.D., on April 12, 2001, and is to take her MRI films with her.  The time of her appointment is 12:15 p.m.  She has an appointment to follow up with  Arturo Morton. [name], M.D., on May 12, 2001, at 3:30 p.m. Dictated by:   Guy Franco, P.A. [facility] Attending Physician:  Nelta Numbers DD:  04/05/01 TD:  04/05/01 Job: [id number]

## 2010-06-07 NOTE — Cardiovascular Report (Signed)
Cathy [name], Cathy [name]                          ACCOUNT NO.:  [id number]   MEDICAL RECORD NO.:  [id number]                   PATIENT TYPE:  INP   LOCATION:  2009                                 FACILITY:  [facility]   PHYSICIAN:  Arturo Morton. [name], M.D. [facility]         DATE OF BIRTH:  Oct 27, 1930   DATE OF PROCEDURE:  DATE OF DISCHARGE:                              CARDIAC CATHETERIZATION   INDICATIONS FOR PROCEDURE:  The patient is a 77 year old female well known  to me.  She has had multiple coronary interventions and subsequently  underwent revascularization surgery.  She also had an aortic aneurysm in the  ascending thoracic aorta which was resected.  She underwent  revascularization surgery with an internal mammary placed to the LAD,  saphenous vein graft to a distal right, and saphenous vein graft to the OM.  She has developed some recurrent chest pain.  Importantly, Dr. Donata Clay was  able to recreate some of the chest pain with direct manual pressure;  however, she feels fatigued and thought that this was similar to a prior  cardiac pain.  The current study was done to assess coronary anatomy.   PROCEDURE:  1. Left heart catheterization.  2. Selective coronary arteriography.  3. Selective left ventriculography.  4. Aortic root aortography.  5. Saphenous vein graft angiography x2.  6. Selective left internal mammary angiography x1.   DESCRIPTION OF PROCEDURE:  The procedure was performed from the right  femoral artery using #6 French catheters.  She tolerated the procedure well.  We used a right bypass catheter to engage the right bypass.  Importantly,  there is no ring surrounding the right bypass.  The left bypass was not  identified despite multiple injections near the previously placed ring.  I  called Dr. Donata Clay and we discussed the case near the completion of the  procedure.  All catheters were subsequently removed and she was taken to the  holding area in satisfactory  clinical condition.   HEMODYNAMIC DATA:  1. Central aortic pressure 174/82.  2. Left ventricular pressure 169/8/24.  3. No gradient on pullback across the aortic valve.   ANGIOGRAPHIC DATA:  1. Ventriculography was performed in the RAO projection.  Overall systolic     function appeared to be well preserved.  There was perhaps 1 to 2+ mitral     regurgitation.  It did not appear to be severe; however, it was more than     just mild.  2. The aortic root demonstrated no evidence of significant aortic     regurgitation.  The aortic prosthesis appeared to be well-seated.  3. The left main coronary artery was free of critical disease.  4. The left anterior descending artery coursed to the apex.  There was     diffuse disease in the mid vessel.  5. The internal mammary to the distal LAD appeared to be intact.  It  inserted nicely into the distal LAD.  The distal LAD itself is fairly     diffusely diseased with 70% narrowing followed by a long area of diffuse     irregularity and then a 90% stenosis at the apex.  6. The ramus intermedius is a large caliber vessel that has been bypassed.     There is a stent placed proximally which appears to be widely patent.     The vessel beyond this is somewhat tortuous and there are at least two     areas of about 50% narrowing with some luminal irregularity.  There is     retrograde filling of the vein graft which we believe to be occluded.  7. The vein graft to the OM probably is occluded.  There is reasonable flow     down the main vessel itself and there is no obvious graft arising from     the ring in the aortic root.  Also, the graft fills in a retrograde     manner distally without much vigorous runoff.  Finally, injection of the     aortic root demonstrates no evidence of a second graft.  8. The circumflex AV vessel is totally occluded and fills as a ghost vessel     from multiple sources of collaterals.  9. The right coronary artery  demonstrates about 30-50% narrowing in the     proximal stent.  In the mid vessel, the prior site of angioplasty has     about 30% narrowing.  The distal vessel is poorly seen, largely because     of probably competitive flow.  10.      The vein graft arises from the Hemashield graft.  There is tapered     narrowing of probably about 50% in the proximal portion of the graft and     is smaller than the diagnostic catheter.  The distal vessel demonstrates     a very smooth graft with nice insertion into a distal artery.   CONCLUSION:  1. Normal left ventricular function.  2. Intact Hemashield graft.  3. Probable occluded graft to the obtuse marginal.  4. A 50% tapered narrowing at the ostium of the graft to the distal right     coronary.  5. Patent internal mammary with diffuse distal disease.   DISPOSITION:  The internal mammary graft is intact but supplies a diseased  vessel.  The right graft is intact and has some ostial tapering.  I  discussed this with Dr. Donata Clay and he felt that we should not be tempted  to stent the proximal portion of this graft as it inserts into the  Hemashield graft.  It is not clear that it is flow-limiting anyway.  With  regard to the occluded graft, this is likely because of good native filling  of the OM.  Continued medical program will be recommended.  Optimal  treatment of her diabetes would be what she truly needs.                                               Arturo Morton. [name], M.D. [facility]    TDS/MEDQ  D:  11/19/2001  T:  11/19/2001  Job:  [id number]

## 2010-06-07 NOTE — Discharge Summary (Signed)
NAMEMAUREENA, [name]                ACCOUNT NO.:  [id number]   MEDICAL RECORD NO.:  [id number]          PATIENT TYPE:  INP   LOCATION:  4731                         FACILITY:  [facility]   PHYSICIAN:  Learta Codding, M.D. LHCDATE OF BIRTH:  1930-03-05   DATE OF ADMISSION:  03/01/2004  DATE OF DISCHARGE:  03/05/2004                           DISCHARGE SUMMARY - REFERRING   PROCEDURES:  Cardiac catheterization, March 04, 2004.   REASON FOR ADMISSION:  Cathy [name] is a 77 year old female, with known  coronary artery disease, who presented to the emergency room with chest  pain.  Please refer to dictated admission note for full details.   LABORATORY DATA:  Normal serial cardiac markers.  Normal electrolytes and  renal function.  Glucose 176 on admission.  Normal liver enzymes.  TSH 0.98.  Normal CBC on admission.   Chest x-ray:  No acute disease.   HOSPITAL COURSE:  The patient was admitted for further evaluation of chest  pain, in the context of known coronary artery disease status post CABG in  2003, and was placed on regimen, including heparin.  Serial cardiac markers  were all normal.   Plan was to proceed with diagnostic cardiac catheterization.  The patient  was premedicated given history of CONTRAST DYE allergy.   The patient remained stable over the weekend and underwent cardiac  catheterization on February 13rd by Dr. Charlies Constable (see report for full  details).  This revealed no new source of ischemia and preserved left  ventricular function (EF 55%).   Specifically, there was 0% restenosis of the proximal LAD stent site,  followed by a 60% lesion prior to the LIMA graft anastomosis, followed by 80-  90% apical LAD disease; total occlusion of the mid CFX and distal RCA; 50%  ramus intermedius; widely patent LIMA-LAD graft, 30% ostial SVG-RCA graft,  and old, 100% occlusion SVG-ramus intermedius graft.   The patient was kept for overnight observation and cleared for  discharge the  following morning in hemodynamically stable condition.  There were no noted  complications of the groin incision site.   The patient was instructed to resume all previous home medications.   MEDICATIONS AT DISCHARGE:  1.  Gemfibrozil 600 mg b.i.d.  2.  Aspirin 325 mg q.d.  3.  Synthroid 0.056 mg q.d.  4.  Altace 5 mg q.d.  5.  Plavix 75 mg q.d.  6.  Prozac 20 mg q.d.  7.  Ativan 1 mg q.d.  8.  Atenolol 50 mg q.d.  9.  Lantus 15 units q.d.  10. Amaryl 4 mg q.d.  11. Folic acid 0.4 mg q.d.  12. Furosemide 20 mg q. Monday/Thursday.  13. Toprol XL 25 mg q.d.  14. Protonix 40 mg q.d.  15. Nitrostat 0.4 mg q.d.   INSTRUCTIONS:  No heavy lifting/driving x 2 days.   The patient is scheduled to follow up with Dr. Bonnee Quin on February 27th  at 4:15 p.m.   DISCHARGE DIAGNOSES:  1.  Nonischemic chest pain.      1.  Normal serial cardiac markers.  2.  Patent grafts/chronic 100% saphenous vein-ramus intermedius graft          occlusion; widely patent proximal left anterior descending stent by          cardiac catheterization, March 04, 2004.      3.  Preserved left ventricular function.      4.  Three-vessel coronary artery bypass graft, April 2003.  2.  CONTRAST DYE allergy.  3.  Insulin-requiring diabetes mellitus.  4.  Hypertension.  5.  Dyslipidemia.      GS/MEDQ  D:  03/05/2004  T:  03/05/2004  Job:  [id number]   cc:   Sydnee Levans, MD  [facility]  [address]  [place]  [place] [address]

## 2010-06-07 NOTE — Discharge Summary (Signed)
[place]. [facility]  Patient:    Cathy [name], Cathy [name] Visit Number: [id number] MRN: [id number]          Service Type: MED Location: [id number] Attending Physician:  Talitha Givens Dictated by:   Joellyn Rued, P.A.-C. Admit Date:  09/09/2000 Disc. Date: 09/11/00   CC:         Elvina Sidle, M.D.             Arturo Morton. [name], M.D. [facility]                  Referring Physician Discharge [place]  DATE OF BIRTH:  Feb 09, 2030  ADMITTING PHYSICIAN:  Luis Abed, M.D.  DISCHARGING PHYSICIAN:  Veneda Melter, M.D.  HISTORY OF PRESENT ILLNESS:  Cathy [name] is a 77 year old white female who on the evening prior to admission had three episodes of chest discomfort all relieved with sublingual nitroglycerin.  The first occurred prior to her going to bed and the second actually woke her up and were associated with shortness of breath and diaphoresis.  She has complained of fatigue and dyspnea on exertion since her discharge.  She went to [facility] and was transferred for further evaluation.  Her history is notable for diabetes, hypertension, hyperlipidemia, hypothyroidism, and dye allergy.  She also has known coronary artery disease with a stent to the LAD and an angioplasty to the RCA in 1998, stent to the circumflex in January of 2001 associated with anaphylaxis, and angioplasty to end-stent circumflex restenosis in October of 2001.  Her last catheterization was in July of 2002 where she underwent an angioplasty to her RCA.  Her ejection fraction at that catheterization was 53%.  She did have a 20% end-stent restenosis in the LAD.  She had a 40% mid distal LAD, 90% distal LAD, 30% end-stent circumflex restenosis, 50%/50% circumflex, and totaled circumflex past the OM.  The RCA had a 99% lesion which was reduced to less than 30% proximally.  She had a 40% lesion at the previous angioplasty site.  LABORATORY DATA:  At [facility], the sodium was 136,  potassium 3.9, BUN 33, creatinine 1.0, and glucose 200.  The total CK was 132 with an MB of 5.0 and troponin 0.07.  PT 13.0, PTT 32.2.  H&H 12.4 and 36.3, normal indices, platelets 223, WBC 6.7.  Here at [facility]. [employer] [facility] [facility] the subsequent CK total was 126 with MB of 4.5, relative index 3.6, and troponin 0.26.  The second was declining. On September 10, 2000, the H&H was 12.1 and 34.7, normal indices, platelets 228, and WBC 6.8.  Prior to discharge, the H&H was 11.5 and 33.0, normal indices, platelets 248, and WBC 10.9.  Sodium 142, potassium 3.9, BUN 14, creatinine 1.1, glucose 223.  EKGs showed sinus bradycardia and early R wave nonspecific ST-T wave changes.  HOSPITAL COURSE:  Cathy [name] was initially admitted to 69.  Overnight she did not have any further chest discomfort and she was continued on her home medications and IV heparin.  Cardiac catheterization performed on September 10, 2000, by Jonelle Sidle, M.D., revealed normal left main, 20% proximal end-stent LAD restenosis, 50% tandem mid LAD lesions, 80-90% distal LAD, occluded proximal circumflex, and large OM.  There was a 30% end-stent restenosis, 50% OM, 95% proximal RCA, and 40% mid RCA.  An left ventriculogram was not performed.  After reviewing the chart, Veneda Melter, M.D., performed angioplasty and stenting on the proximal RCA.  Post sheath removal  and bed rest, she was doing well.  She stated that she felt a little weak.  She was ambulating in the hall.  The catheterization site was intact.  It was felt that she could be discharged home.  DISCHARGE DIAGNOSES:  1. Unstable angina.  2. Status post angioplasty and stenting of the proximal right coronary     artery.  3. Progressive coronary artery disease.  4. Hyperglycemia.  (Glucometers during her admission here ranged from 135 to     the 120s.)  5. Normocytic anemia, which was stable.  6. History of hypertension.  7. Hyperlipidemia.  8. Diabetes.  9.  Dye allergy. 10. Hypothyroidism.  DISPOSITION:  She is discharged home.  She is asked to continue her home medications.  DISCHARGE MEDICATIONS:  1. Plavix 75 mg Maisie Fus D. [name], M.D., is to determine how long she needs     to stay on this medication.)  2. Synthroid 112 mcg q.d.  3. Toprol XL 25 mg q.d.  4. ______ 10 mg q.d.  5. Lipitor, unknown dose at bedtime.  6. K-Dur 10 mEq q.d.  7. Glucotrol 2.5 mg b.i.d.  8. Aceon 4 mg q.d.  9. Coated aspirin 325 mg q.d. 10. Actos 45 mg q.d. 11. Vioxx 25 mg q.d. 12. Nexium 40 mg q.d. 13. Sublingual nitroglycerin as needed.  ACTIVITY:  She was advised no lifting, driving, sexual activity, or heavy exertion x 2 days.  DIET:  Maintain a low-salt, low-fat, low-cholesterol, ADA diet.  WOUND CARE:  If she has any problems with her catheterization site, she as instructed to call.  SPECIAL INSTRUCTIONS:  She was told to bring all medications to all appointments.  She will check her sugars and take these records to Elvina Sidle, M.D., on October 01, 2000, at 10:30 a.m.  FOLLOW-UP:  She will also see Arturo Morton. [name], M.D., on October 08, 2000, at 12:15 p.m.  It is noted that her records need to be reviewed to look for the last time that her TSH, lipids, and hemoglobin A1C were evaluated. Dictated by:   Joellyn Rued, P.A.-C. Attending Physician:  Talitha Givens DD:  09/11/00 TD:  09/11/00 Job: [id number]

## 2010-06-07 NOTE — Cardiovascular Report (Signed)
[place]. [facility]  Patient:    Cathy [name], Cathy [name]. Visit Number: [id number] MRN: [id number]          Service Type: Attending:  Arturo Morton. [name], M.D. [facility] Proc. Date: 08/12/00   CC:         Cardiac Catheterization Laboratory             Elvina Sidle, M.D.                        Cardiac Catheterization  INDICATIONS:  Cathy [name] is well known to me.  She is a 77 year old diabetic who has developed recurrent chest pain.  She has had previous stenting of the LAD, previous stenting of the OM-1, and previous dilatation of the mid right. All of these sites were open in January.  The current study was done to assess anatomy.  The patient has a history of profound contrast reaction.  Because of this, she was pretreated with steroids.  She was brought to the laboratory for further evaluation.  PROCEDURES: 1. Left heart catheterization. 2. Selective coronary arteriography. 3. Selective left ventriculography.  DESCRIPTION OF PROCEDURE:  The procedure was performed from the right femoral artery after informed consent and pretreatment with steroids, as well as Benadryl and histamine antagonists.  The procedure was without complications. She was taken to the holding area in satisfactory clinical condition.  Prior to this time, Dr. Loraine Leriche Pulsipher and I reviewed the films in extensive detail.  HEMODYNAMIC DATA: 1. Central aortic pressure 139/67. 2. LV pressure 150/22. 3. No gradient on pullback across the aortic valve.  ANGIOGRAPHIC DATA: 1. Ventriculography was performed in the RAO projection.  There was mild    mitral regurgitation graded as 1-2+.  In addition, there was mild    inferobasal hypokinesis and ejection fraction of 53%.  This is new from the    previous study. 2. The left main coronary artery is free of critical disease. 3. The left anterior descending artery demonstrates about 20% narrowing at the    previous stent site prior to the origin of  the septal and diagonal.  The    septal is widely patent.  There is about a 40% area of eccentric plaquing    just prior to the takeoff of the diagonal.  The distal LAD has tandem    lesions of 40% and 40%, and then there is an 80% stenosis just before it    reaches the apical tip.  At the apical tip and just beyond, there is a 90%    narrowing and then there is very diffuse disease in the distal vessel    beyond this location. 4. The circumflex provides a large first marginal branch.  At the stent site,    there is about 30% narrowing.  There are tandem lesions in a highly    tortuous mid vessel of about 50%.  The distal vessel consists of a    bifurcating marginal branch.  The AV circumflex is totally occluded and the    distal circumflex fills by retrograde collaterals from the left coronary    circulation.  This appears to be a relatively small vessel. 5. The right coronary artery has a relatively small distal distribution.  It    is fairly large in caliber, occupying the AV groove, but then the distal    branches are moderately small.  The vessel also has a marked anterior    takeoff and previously an AL-1  has been used to cannulate for intervention.    We used a left bypass catheter to opacify this vessel.  There is some mild    irregularity proximally.  In the first steep bend, there is a subtotal    occlusion which is new from the previous study in June.  At that time,    there was less than 30% narrowing at that site.  In the mid vessel, there    is about 40% narrowing at the previous PTCA site.  There is some diffuse    luminal irregularity throughout, but no high-grade areas of disease.  CONCLUSIONS: 1. Well preserved left ventricular function with an inferobasal wall motion    abnormality, mild reduction in overall systolic ejection fraction, and    probable superimposed mitral regurgitation. 2. No evidence of restenosis at the previous left anterior descending artery     stent site. 3. No evidence of restenosis at the obtuse marginal stent site. 4. 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 5. New subtotal occlusion of the right coronary artery, as described above.  DISPOSITION:  This will likely be a complex procedure.  The options include revascularization surgery, but I am somewhat reluctant given the diffuse nature of the distal LAD disease.  The fact that the AV circumflex is a somewhat small caliber vessel with questionable long-term patency and that the right coronary is not huge.  She has been previously dilated on this bend and while this represents a complex intervention, I am leaning in this direction given the overall anatomy.  We will pretreat the patient with Plavix.  In addition, we will place her on IIb/IIIa antagonists and the procedure is planned for Friday morning. Attending:  Arturo Morton. [name], M.D. [facility] DD:  08/12/00 TD:  08/13/00 Job: [id number]

## 2010-06-07 NOTE — Op Note (Signed)
[place]. [facility]  Patient:    Cathy [name]                        MRN: [id number] Proc. Date: 02/13/99 Adm. Date:  [id number] Attending:  Pricilla Riffle CC:         Collins Scotland, M.D.             Arturo Morton. [name], M.D. [facility]             Cardiac Catheterization Laboratory                           Operative Report  PROCEDURES PERFORMED: 1. Selective coronary angiography of the right coronary artery. 2. PTCA with stent placement in the first obtuse marginal branch.  INDICATIONS:  Cathy [name] is a 77 year old woman with a history of coronary artery disease.  She is status post stent to the LAD and PTCA of the mid right coronary. She had a right cardiac catheterization by Dr. [name] yesterday for recurrent unstable angina and non-Q-wave myocardial infarction.  The catheterization was complicated by profound anaphylactic shock secondary to contrast reaction.  She  required several doses of epinephrine and then had ventricular tachycardia requiring several defibrillations.  An intra-aortic balloon pump was placed for  stabilization.  She had significant ST depression on her EKG.  After she was hemodynamically stabilized with the balloon pump in place, her EKG improved and her chest pain resolved.  It was opted to pretreat her overnight with high-dose intravenous steroids as well as H1 and H2 blockers.  She was brought back today for completion of her catheterization and percutaneous intervention of the obtuse marginal branch.   PROCEDURAL NOTE:  There was a preexisting 7-French sheath in the left femoral artery.  This was exchanged over a wire for a new 7-French sheath.  We used a 6-French AR2 catheter for injections into the right coronary.  Injections of the right coronary artery revealed presence of diffuse 25% stenosis in the proximal  vessel.  There was a 40% stenosis in the mid vessel of the previous PTCA site. The right coronary was a very  tortuous artery, giving rise to a small posterior descending artery and two small posterolateral branches  After this we opted to proceed with percutaneous intervention of the obtuse marginal branch.  We used a 7-French Voda left four guiding catheter.  Heparin nd Integrilin were administered per protocol.  The lesion was crossed with a BMW wire.  Predilatation was performed with a 2.5 x 15 mm CrossSail balloon, taken to 8 atm for 90 sec.  Following this a 2.5 x 13 mm Tetra stent was deployed across the lesion at 10 atm for 50 sec.  We then performed post-dilatation within the mid portion of the stent with a 2.5 x 9 mm Solaris inflated to 15 atm for 64 sec. his balloon was pulled back to the proximal margin of the stent and inflated to 17 tm for 53 sec.  Final angiographic images revealed patency of the obtuse marginal branch, with less than 10% residual stenosis and TIMI-3 flow.  COMPLICATIONS:  None.  RESULTS:  Successful PTCA with stent placement in the first obtuse marginal branch, reducing a 95% stenosis to less than 10% residual -- good TIMI-3 flow.  PLAN:  The patient will be continued on Integrilin overnight.  Plavix will then be administered for four weeks.  The balloon pump  and sheaths will be removed when her ACT is less than 150 sec. DD:  02/13/99 TD:  02/14/99 Job: [id number]

## 2010-06-07 NOTE — Cardiovascular Report (Signed)
[place]. [facility]  Patient:    Cathy [name], Cathy [name]                       MRN: [id number] Proc. Date: 11/12/99 Adm. Date:  [id number] Attending:  Ronaldo Miyamoto CC:         Elvina Sidle, M.D.  Arturo Morton. [name], M.D. [facility]  CV Laboratory  Roxanne Mins, M.D., [facility]   Cardiac Catheterization  INDICATIONS:  Cathy [name] is a pleasant 77 year old female who is well known to me.  In 1998 she had PTCA of the right coronary artery and stenting of the LAD.  She has non-insulin dependent diabetes mellitus.  In January of the paST year she presented with unstable angina.  She had a high-grade stenosis of the intermediate branch, but developed profound anaphylactic shock with the contrast injection.  This required resuscitative measures, and she subsequently was pretreated, and had balloon dilatation and stenting of the circumflex artery by Dr. Gerri Spore and did well.  She was discharged from the hospital and has been getting along reasonably well but has had some fatigue. The Cardiolite has demonstrated lateral ischemia.  She was brought back to the lab today for further evaluation.  Importantly, she has been pretreated with prednisone and Solu-Medrol prior to coming to the catheterization laboratory. She also received Benadryl.  PROCEDURES: 1. Left heart catheterization. 2. Selective coronary arteriography. 3. Selective left ventriculography. 4. Percutaneous transluminal coronary angioplasty of the circumflex    coronary artery.  DESCRIPTION OF PROCEDURE:  The patient was brought to the catheterization lab and prepped and draped in the usual fashion.  Through an anterior puncture the right femoral artery was easily entered.  A 6 French sheath was placed.  Views of the left and right coronary arteries were obtained in multiple angiographic projections.  Ventriculography was performed in the RAO projection.  She had evidence of in-stent re-stenosis but it  was a focal lesion and not a diffuse in-stent re-stenosis.  As a result, we elected to recommend use of a Cutting Balloon to open this lesion.  Importantly, we also elected to not recommend radiation therapy because of the short length of the stenosis.  After discussion with the patients family and the patient, we then turned our attention to percutaneous intervention.  Heparin and Integrilin were given according to protocol.  A 6 French sheath was exchanged for a 7 [place] sheath. A JL4 guiding catheter was utilized to intubate the left main.  The lesion was crossed with a .014 Hi-Torque Floppy wire.  We initially dilated using a 2.75 x 10 mm length Cutting Balloon.  This was taken up to about 8 atmospheres.  There was still a moderate area of narrowing in the proximal portion of the stent, and we elected to use a 3 mm CrossSail 10 mm length balloon to postdilate.  This resulted in an excellent angiographic appearance. The patient has scattered disease in the circumflex and LAD in particular, and we elected not to address these areas as they are mostly distal.  There were no complications.  HEMODYNAMIC DATA:  The central aortic pressure was 169/77.  LV pressure 169/20.  There was no gradient on pullback across the aortic valve.  ANGIOGRAPHIC DATA:  The left main coronary artery is free of critical disease.  The left anterior descending artery demonstrates mild stenosis in the area of the previous stent prior to the septal perforator.  Beyond this there was multiple areas of luminal irregularity  but no critical lesion.  There is a 70% stenosis in the midportion of the distal vessel and a 90% stenosis in the apical portion of the distal vessel in an area of a very small caliber vessel. There is mild luminal irregularity of the large diagonal branch but no critical stenoses.  The circumflex provides an AV circumflex which was totally occluded.  This is chronic.  There is a large ramus  intermedius vessel that has an 80% stenosis in the previously placed stent.  Following balloon dilatation this was reduced to less than 20%.  There was a 50% stenosis in the bend followed by a 70% stenosis.  The distal vessel bifurcated.  The right coronary artery is a markedly tortuous vessel that has diffuse luminal irregularities.  There is about 30% narrowing in the mid vessel and 40% slightly more distal to this area.  There was no high-grade re-stenosis  VENTRICULOGRAM:  Ventriculography in the RAO projection reveals preserved global systolic function.  Ejection fraction is 67.8%.  No segmental abnormalities contraction are identified.  CONCLUSIONS: 1. Well preserved left ventricular function. 2. No evidence of re-stenosis at the previous site of stenting in the    left anterior descending artery or the previous site of balloon    dilatation in the right coronary artery. 3. In-stent re-stenosis with a short focal area with successful repeat    balloon dilatation. 4. History of profound anaphylactic shock related to contrast dye with    successful pretreatment.  DISPOSITION:  The patient will be treated medically.  She has diabetes as an increased risk for further cardiac events.  Measures to try to prevent this will be warranted. DD:  11/12/99 TD:  11/13/99 Job: [id number]

## 2010-06-07 NOTE — Op Note (Signed)
[place]  Patient:    Cathy [name], COULIBALY Visit Number: [id number] MRN: [id number]          Service Type: MED Location: 2300 [id number] Attending Physician:  Tressie Stalker Dictated by:   Mikey Bussing, M.D. Proc. Date: 05/05/01 Admit Date:  04/25/2001   CC:         [facility]   Operative Report  PREOPERATIVE DIAGNOSIS:  Class 4 unstable angina with three-vessel disease, 5 cm ascending thoracic aneurysm.  POSTOPERATIVE DIAGNOSIS:  Class 4 unstable angina with three-vessel disease, 5 cm ascending thoracic aneurysm.  OPERATION PERFORMED: 1. Coronary artery bypass grafting times three (left internal mammary artery    to distal left anterior descending, saphenous vein graft to obtuse    marginal, saphenous vein graft to right coronary artery). 2. Replacement of ascending thoracic aortic aneurysm with a 32 mm Hemashield    straight graft.  SURGEON:  Mikey Bussing, M.D.  ASSISTANT:  Lissa Merlin, P.A.  ANESTHESIA:  General by Dr. Kipp Brood.  INDICATIONS FOR PROCEDURE:  The patient is a 77 year old type 2 diabetic with a long history of angina treated with percutaneous interventions and stents of the LAD, circumflex marginal and right coronary arteries.  She had persistent angina and progression of her native disease.  She also was diagnosed with a 5 cm ascending thoracic aneurysm with a normal aortic valve.  She was referred for coronary artery bypass grafting as well as replacement of the ascending thoracic aneurysm with a graft.  Prior to the operation, I examined the patient in her hospital room and reviewed the results of the cardiac catheterization echo and CT scans with the patient.  I discussed the indications and expected benefits of coronary artery bypass graft surgery for treatment of her coronary artery disease and replacement of her thoracic aneurysm with a Dacron graft.  I discussed alternatives to surgical  therapy for treatment of her heart disease and the expected outcome of those alternative therapies.  I reviewed the major aspects of the proposed heart operation including the location of the surgical incisions, the use of general anesthesia and cardiopulmonary bypasses, the choice of conduit for grafting, and the plan to use a Dacron graft to replace the ascending aortic aneurysm.  I reviewed the risks associated with this operation to the patient including risks of MI, CVA, bleeding, blood transfusion requirement, infection, and death.  She understood these implications of the surgery and agreed to proceed with the operation as planned under what I felt was an informed consent.  OPERATIVE FINDINGS:  The saphenous vein was harvested from the right thigh using endoscopic vein technique.  The aprotinin protocol was used to enhance her coagulation function as she had been on Plavix for over a year.  The ascending thoracic aorta measured over 5 cm and quickly tapered back to a more normal size at the level of the innominate artery.  The aorta was cannulated high in the proximal arch. The patient has mild left ventricular hypertrophy from hypertension.  DESCRIPTION OF PROCEDURE:  The patient was brought to the operating room and placed supine on the operating table where general anesthesia was induced under invasive hemodynamic monitoring.  The chest, abdomen and legs were prepped with Betadine and draped as a sterile field.  A sternal incision was made as the saphenous vein was harvested from the right thigh using endoscopic vein technique.  The internal mammary artery was harvested on the left side as a  pedicle graft from its origin at the subclavian vessels.  It was a good vessel with excellent flow.  Heparin was administered and the ACT was documented as being therapeutic.  The sternal retractor was placed. Pursestrings were placed in the ascending aorta and right atrium and the patient  was cannulated and placed on bypass.  The coronaries were identified for grafting.  The left ventricular vent was placed via the right superior pulmonary vein.  Cardioplegia cannulas were placed for both antegrade and retrograde delivery of cold blood cardioplegia.  The patient was cooled to 28 degrees and as the aortic crossclamp was applied, 500 cc of cold blood cardioplegia was delivered antegrade and 150 cc of cardioplegia was delivered retrograde with good cardioplegic arrest and septal temperature dropping less than 14 degrees.  Topical iced saline slush was used to augment myocardial preservation and a pericardial insulator pad was used to protect the left phrenic nerve.  First the distal coronary anastomoses were performed.  The first distal anastomosis was to the distal right.  This was a 1.8 mm vessel with proximal 90% stenosis.  A reversed saphenous vein was sewn end-to-side with running 7-0 Prolene with good flow through the graft.  The second distal anastomosis was to the obtuse marginal.  This was a 1.8 to 2.0 mm vessel intramyocardial and a reversed saphenous vein was sewn end-to-side with running 7-0 Prolene with good flow through the graft.  Cardioplegia was redosed.  The third distal anastomosis was to the distal LAD.  It was intramyocardial except at its lower third where the anastomosis was performed.  It was a 1.4 mm vessel and it had proximal 80% stenosis.  the left internal mammary artery pedicle was brought through an opening created in the left lateral pericardium and was brought down on the LAD and sewn end-to-side with running 8-0 Prolene.  There was excellent flow through the anastomosis after briefly releasing the atraumatic vascular clamp on the mammary pedicle.  The pedicle was re-occluded and the pedicle was secured to the epicardium.  Cardioplegia was redosed.  Attention was then directed to the ascending thoracic aneurysm.  This measured 5 cm above the  sinotubular junction.  The aorta was resected just below the  crossclamp and just above the coronary ostia.  It was sized to a 32 mm straight Dacron Hemashield graft.  The proximal anastomosis was performed first using a running 4-0 Prolene over a Teflon felt strip on the proximal aortic end.  The suture line was tied and was sealed with a coat of biologic glue.  The graft was then trimmed to the appropriate length and then the distal anastomosis was performed just beneath the cross clamp using a running 4-0 Prolene and also incorporating a Teflon felt strip into the aortic tissue. This suture line was not tied at this time.  Cardioplegia was redosed to keep septal temperature less than 15.  While the aorta was still clamped, the two vein grafts were then placed on the Dacron graft using the cautery device to make an appropriate sized opening in the graft and the anastomoses were constructed using running 6-0 Prolene.  After the coronaries were performed, the distal aortic anastomosis was tied and sealed with a layer of biologic glue.  The patient was then rewarmed and the mammary pedicle was opened and a dose of warm blood retrograde cardioplegia was given to flush air from the coronaries.  The usual deairing maneuvers were also performed to remove all the air from the left  side of the heart to an untied proximal vein graft site on the superior aspect of the Dacron graft.  After all air had been evacuated, the suture line was tied and the aortic crossclamp was removed.  The heart was cardioverted back to a regular rhythm.  There was no significant bleeding from the anastomoses, just some bleeding through the interstices of the Dacron graft.  The patient was rewarmed and reperfused.  Temporary pacing wires were applied.  The cardioplegia cannula and left ventricular vent were removed.  The ventilator was resumed after reinflating the lungs.  When the patient reached 36.8 degrees, she was  weaned from bypass on low-dose dopamine with stable blood pressure and hemodynamics.  Protamine was administered. There was no adverse effect to the protamine.  The patient had persistent oozing consistent with a longstanding Plavix effect.  She was given platelets and fresh frozen plasma.  This improved her coagulation function.  The mediastinum was irrigated with warm antibiotic irrigation.  The leg incision was irrigated and closed over a Jackson-Pratt drain.  The superior pericardial fat was closed over the aorta and vein grafts.  Two mediastinal and a left pleural chest tube were placed and brought out through separate incisions. The sternum was reapproximated with 8 interrupted steel wire.  The pectoralis fascia was closed with interrupted #1 Vicryl.  The subcutaneous and skin were closed with a running Vicryl and sterile dressings were applied.  Total bypass time was 200 minutes with crossclamp time of 140 minutes. Dictated by:   Mikey Bussing, M.D. Attending Physician:  Tressie Stalker DD:  05/05/01 TD:  05/06/01 Job: [id number]

## 2010-06-07 NOTE — Discharge Summary (Signed)
Cathy [name], SEGUIN NO.:  [id number]   MEDICAL RECORD NO.:  [id number]          PATIENT TYPE:  INP   LOCATION:  6742                         FACILITY:  [facility]   PHYSICIAN:  Isidor Holts, M.D.  DATE OF BIRTH:  [birth year]   DATE OF ADMISSION:  02/18/2006  DATE OF DISCHARGE:  02/24/2006                               DISCHARGE SUMMARY   PRIMARY CARE PHYSICIAN:  Dr. Weyman Pedro, [place], [place].   DISCHARGE DIAGNOSES:  1. Atypical chest pain, status post negative stress Myoview.  2. History of coronary artery disease, status post coronary artery      bypass grafting.  3. Hypertension.  4. Type 2 diabetes mellitus.  5. Gastroparesis, secondary to #4 above.  6. Hypothyroidism.  7. History of migraines.  8. History of depression.  9. Cholelithiasis.  10.Severe hypertriglyceridemia.   DISCHARGE MEDICATIONS:  1. Altace 2.5 mg p.o. daily.  (Was on 5 mg p.o. daily.)  2. Amaryl 4 mg p.o. daily.  3. Aspirin 325 mg p.o. daily.  4. Coreg 12.5 mg p.o. b.i.d. (Was on Atenolol 50 mg p.o. b.i.d. )  5. Celexa 10 mg p.o. daily.  6. Folic acid 1 mg p.o. daily.  7. Synthroid 50 mcg p.o. daily.  (Was on 56 mcg p.o. daily.)  8. Ativan 1 mg p.o. nightly.  9. Phenergan 12.5 mg p.o. p.r.n. q. 8 h as needed.  10.Plavix 75 mg p.o. daily.  11.Reglan 5 mg p.o. before meals or food and nightly.  12.Lopid 600 mg p.o. b.i.d.  13.Multivitamins 1 p.o. daily.  14.Protonix 40 mg p.o. daily.  15.Lantus insulin 70 units subcutaneously q. 12 h.  16.Norvasc has been discontinued, until re-evaluated by primary care      physician.   This medication list may of course, be updated at the time of actual  discharge, by discharging MD.   PROCEDURES:  1. Two-view chest x-ray dated February 09, 2006.  This showed no      pneumonia.  There was stable mild cardiomegaly.  2. Head CT scan dated February 19, 2006.  This showed atrophy with      chronic changes, but no definite acute  finding.  3. Adenosine stress Myoview dated February 20, 2006.  This was reported      as probably negative pharmacologic stress Myoview study revealing      no significant stress-induced EKG abnormalities, normal ventricle      size, low-normal overall left ventricular systolic function.  By      clinographic imaging, there was permanent physiologic apical      thinning likely, but short septal infarction at the base of the      septum cannot be unequivocally excluded.  No myocardial ischemia.      Ejection fraction 52%.   CONSULTATIONS:  Rollene Rotunda, MD, [facility], [facility].   HISTORY:  As in H&P notes of February 18, 2006, dictated by Lonia Blood,  M.D.  However in brief, this is a 77 year old female, with known history  of coronary artery disease status post CABG, type 2 diabetes mellitus,  gastroparesis, hypertension, cholelithiasis, migraine  headaches, and  hypothyroidism, who presents with retrosternal chest pain.  It is  thought that the patient's description of her chest pain was somewhat  atypical in nature.  However, given her risk factors for coronary artery  disease, it was felt that admission for further workup and management  was justified.  The patient was, therefore, admitted.   HOSPITAL COURSE:  #1.  CHEST PAIN:  For details of presentation, refer to admission  history above.  The patient's EKG showed no evidence of acute ischemic  changes.  Cardiac enzymes were cycled and remained unelevated.  Although  initial chest x-ray suggested infiltrate of right lower lobe, repeat two-  view chest x-ray of February 09, 2006, showed no evidence of pneumonia.  Cardiology consultation was called which was kindly provided by Rollene Rotunda, MD, [place] cardiologist.  The patient underwent an adenosine  stress Myoview on February 23, 2006, which showed no evidence of  myocardial ischemia.  The patient had no further recurrence of chest  pain during the course of her  hospitalization.  She continues on beta  blocker treatment, low-dose Aspirin.  She has been switched to Coreg,  from pre-admission Atenolol.   #2.  GASTROPARESIS:  The patient continued to experience nausea during  the course of her hospitalization.  As a matter of fact, she vomited  once on day #1 of hospitalization.  It was felt attributable to  patient's known gastroparesis, against a background of type 2 diabetes  mellitus.  She was subsequently managed with Reglan.   #3.  HYPERTENSION: This was controlled with a combination of ACE  inhibitor and Coreg.  It did not prove necessary to continue her pre-  admission Norvasc. This has therefore, been discontinued.  However, on  February 22, 2006, the patient was found to have low-normal blood  pressure, with systolic blood pressure of 93.  Altace was, therefore,  halved to 2.5 mg daily, and patient's blood pressure response recovered.  As of February 23, 2006, blood pressure was well controlled at 111/56  mmHg.   #4.  TYPE 2 DIABETES MELLITUS:  Before admission, the patient was on  rather large doses of Lantus insulin which she claimed was 70 units in  the a.m. and 60 units in the p.m.  She was commenced on somewhat lower  doses at the beginning of her hospitalization, and we have titrated her  Lantus dosages gradually.  CBGs have, however, proved difficult to  control.  As of the time of this dictation on February 23, 2006, Lantus  dosage was at 70 units subcutaneously b.i.d.  We expect patient's  primary care physician to continue to adjust her insulin regimen as  appropriate following discharge.   #5.  HYPOTHYROIDISM:  The patient continues on replacement therapy for  this.  TSH on February 20, 2006, was 1.316; i.e., euthyroid.   #6.  MIGRAINE HEADACHES:  This did not prove troublesome, during the  course of hospitalization.  At the early phase of patient's hospitalization, she complained of headache, necessitating doing a head  CT  scan, which was entirely negative .  She was managed with simple  analgesics. She did complain of some sinus congestion which responded  to Nasonex nasal spray.   #7.  SEVERE HYPERTRIGLYCERIDEMIA:  The patient does have a history of  hypertriglyceridemia and was on Lopid before admission.  Lipid profile  was checked during this hospitalization, and total cholesterol was found  to be 245 with triglycerides of 2399.  She has  been recommenced on  Lopid.  Her serum lipase level was 27.   DISPOSITION:  Provided no further acute events occur overnight, it is  anticipated that on or about February 24, 2006, the patient's glycemia  will be sufficiently under control and reasonable for the patient to be  discharged in satisfactory condition for followup with her primary care  physician. Other acute issues have all resolved.  She is recommended a  heart healthy diet, activity as tolerated.   FOLLOWUP INSTRUCTIONS:  The patient is instructed to follow up with her  primary care physician, Dr. Weyman Pedro, [place], [place].  She is  also recommended to follow up with her primary cardiologist, Dr.  [name], at scheduled appointment on March 03, 2006, at 9:15 a.m.,  telephone number [phone].      Isidor Holts, M.D.  Electronically Signed     CO/MEDQ  D:  02/23/2006  T:  02/23/2006  Job:  [id number]   cc:   Bevelyn Ngo. [name], MD, [facility]

## 2010-06-07 NOTE — Cardiovascular Report (Signed)
[place]. [facility]  Patient:    Cathy [name]                        MRN: [id number] Proc. Date: 02/12/99 Adm. Date:  [id number] Attending:  Pricilla Riffle CC:         Cardiac Catheterization Lab             Weyman Pedro, M.D.                        Cardiac Catheterization  INDICATIONS:  [name] is a very pleasant 77 year old who has previously undergone percutaneous intervention.  She has been having symptoms for weeks but refused to come in and was finally convinced by her sister to come in.  She arrived with unstable angina and was seen by Dr. Dietrich Pates.  She was brought up to the  catheterization laboratory because of continued pain on IV heparin.  She previously had a stent placed in the LAD and a percutaneous intervention of the right coronary artery.  PROCEDURE:  Coronary arteriography.  DESCRIPTION OF PROCEDURE:  The patient was brought to the catheterization lab and prepped and draped in the usual fashion.  She was not premedicated as there is o prior history of any contrast allergy.  The groin was prepped and draped in the  usual fashion.  Xylocaine, 1%, was used for local anesthesia.  A 6-French sheath was placed in the right femoral artery.  Views of the left coronary artery was hen obtained in multiple angiographic projections.  After approximately the fifth or sixth injection, the patient developed tingling.  Within a few minutes, she then developed profound hypotension and marked urticarial rash above the waist. This went all the way up to the face, and she developed associated shortness of breath. The diagnostic catheter was then quickly removed.  The femoral vein was stuck, nd fluids were administered.  This failed to resolve the situation.  The patient was given adrenalin.  Shortly after this, the blood pressure came up to 180/110 with a heart rate of about 160.  She then developed some chest pain associated  with what appeared to be some ST elevation on the monitor.  She then developed ventricular tachycardia requiring multiple countershocks.  As the adrenalin effect began to  wear off, there began to be some stabilization of the rhythm.  Intra-aortic balloon pump was inserted to try to stabilize the coronary situation.  Heparin was also  administered.  The patient was given Solu-Medrol, Benadryl, and Pepcid, all intravenously.  With this, things gradually stabilized.  Her chest pain started to improve.  Intra-aortic balloon pump was placed at 1:1.  I then discussed the case at length with Dr. Antoine Poche and Dr. Gerri Spore.  The patient has continued patency of the stent to the LAD at the previous site of stenting.  She has a new high grade lesion in the proximal circumflex marginal.  The right coronary artery was not injected.  Multiple EKGs were then obtained which revealed some precordial ST depression but no ST elevation in the inferior leads and specifically no lateral ST elevation.  Because of the continued ST depression, we elected then to give one  more brief injection after approximately two hours to document continued patency of the circumflex artery.  She was noted to have TIMI-3 flow in the circumflex with continued patency and because of this, no further procedure was performed.  It as felt that she would be brought back to the laboratory in approximately 12 hours for percutaneous intervention by Dr. Gerri Spore.  She did eventually tolerate the procedure.  Her pain improved.  I discussed the case at length with her family.   HEMODYNAMIC DATA:  The initial central aortic pressure was approximately 130/80. After profound hypotension, the blood pressure fell to approximately 20-30. After restoration and with intra-aortic balloon pumping, the pressure was approximately 118/80 with augmentation to 145.  No left heart catheterization was performed in the left  ventricle.  ANGIOGRAPHIC DATA: 1. The left main coronary artery was free of critical disease. 2. The LAD demonstrates about 30% narrowing at the previous site of angioplasty.    There is moderate luminal irregularity throughout the LAD coursing all the way    to the apex with about a 50-70% stenosis in the distal vessel.  The distal    vessel wraps the apex but appears to be widely patent.  At the stent site, there    is no evidence of significant restenosis. 3. The AV circumflex, as previously noted, is occluded. 4. The circumflex marginal is a large vessel that courses out to the lateral wall.    There is a 95% hazy stenosis followed by multiple areas of 30, and then 50 and    70% stenosis. 5. The right coronary artery and ventriculogram were not performed, as previously    noted.  CONCLUSIONS: 1. No evidence of significant restenosis at the previous angioplasty site. 2. New high grade stenosis in the proximal circumflex artery with continued    TIMI-3 flow. 3. Life-threatening contrast reaction, successfully reversed by pharmacologic    and mechanical support.  DISPOSITION:  We have discussed with her case carefully.  She will be continued on heparin.  She has been given Plavix.  She has sheaths in both groins.  We will ot give her IIB3As unless continued ischemia continues.  We plan to bring her back to the catheterization lab at 8:30 tomorrow morning, at which time, Dr. Gerri Spore ill likely stent her circumflex artery after taking pictures of the right coronary artery.  DD:  02/12/99 TD:  02/13/99 Job: [id number]

## 2010-06-07 NOTE — Discharge Summary (Signed)
Cathy [name], Cathy [name]                ACCOUNT NO.:  [id number]   MEDICAL RECORD NO.:  [id number]          PATIENT TYPE:  INP   LOCATION:  2025                         FACILITY:  [facility]   PHYSICIAN:  Mobolaji B. Bakare, M.D.DATE OF BIRTH:  December 24, 1930   DATE OF ADMISSION:  02/09/2005  DATE OF DISCHARGE:  02/14/2005                                 DISCHARGE SUMMARY   PRIMARY CARE PHYSICIAN:  Dr. Eliberto Ivory in [place]   CARDIOLOGIST:  Arturo Morton. [name], M.D.   GASTROENTEROLOGIST:  Lina Sar, M.D.   CONSULTATIONS:  1.  Cardiologist: Arturo Morton. [name], M.D.  2.  Gastrointestinal: Barbette Hair. Arlyce Dice, M.D.   FINAL DIAGNOSES:  1.  Nausea secondary to gastroparesis.  2.  Atypical chest pain.  3.  Gastroparesis.  Gastric emptying study scheduled for Monday 08 February 2005.  4.  Urinary tract infection.  5.  Diabetes mellitus.  6.  Asymptomatic gallstones.  7.  Postnasal drip.   PROCEDURES:  1.  MRI of the brain on 10 February 2005 showed atrophy with multiple lacunes      along with areas of ischemic demyelination of the fair images which      represent a combination of longstanding hypertension and diabetes with      small vessel disease.  There was no acute stroke identified.  2.  Myoview study showed normal adenosine Myoview with no diagnostic      electrocardiographic changes. Ejection fraction was normal at 61%.      There was no evidence of ischemia.  3.  HIDA scan, normal study with no evidence of cystic or bile duct      obstruction.   BRIEF HISTORY:  Ms. Hurd is a 77 year old Caucasian female with multiple  medical problems.  She presented to the emergency room with chest pain and  significant nausea. She described the chest pain as being intermittent in  nature.  She has a history of CAD and has had stent placement to LAD and  bypass surgery.  Her EKG in the emergency room showed T wave inversion in  leads V4, V5, V6, with ST depression in similar leads which is unchanged  from previous.   The nausea has been chronic in nature.  She stated that this became worse  after she had some good foods during the Christmas time, and she attributes  all her symptoms to this particular episode which she thinks she had an  allergy to good foods.  Ms. Lorenzen was admitted, therefore, for evaluation  and treatment.   HOSPITAL COURSE:  #1.  CHEST PAIN:  She did rule out for myocardial infarction with 2 sets of  cardiac enzymes which were negative.  She underwent a Myoview study with  results as noted above.  There was no evidence of ischemia.  Cardiology did  not feel it necessary to pursue any invasive procedure at this time.   #2.  PERSISTENT NAUSEA:  This apparently is worse early in the morning.  She  has been evaluated in the past by Dr. Juanda Chance approximately 18 months ago,  according  to patient.  She had a nuclear gastric emptying test in 2005.  Results were within normal limits.  She was evaluated for symptomatic  gallbladder disease with a HIDA scan more in part because of patient's  request to have surgery done on this gallbladder. There was no indication of  common bile duct or cystic duct obstruction.  Surgery is not indicated at  this time.  GI was consulted for evaluation of persistent nausea, and it was  likely she should pursue gastric emptying test.  The most likely etiology is  gastroparesis from longstanding diabetes mellitus.  Gastric emptying test is  scheduled for January 2007.  The patient was symptomatically treated with  Zofran and Reglan.  She was discharged home on Reglan after meals and at  bedtime.  She needs to be monitored for side effects periodically.   #3.  URINARY TRACT INFECTION: She had a strongly positive UA.  Unfortunately, urine specimen was not sent for culture.  Nevertheless, she  was treated with ceftriaxone IV, and this was transitioned to Vantin 200 mg  twice daily.  She will complete 7 days of treatment.   #4.  DIABETES  MELLITUS:  Glucose reading by patient on initial evaluation in  the emergency room showed blood glucose on the low side.  It was felt that  she had not been eating secondary to the persistent nausea and had been  taking her medication.  The dosages were reduced.  During the course of  hospitalization when food intake improved, her blood sugar started rising,  and this was adjusted accordingly.  She was discharged on her home regimen.   #5.  POSTNASAL DRIP:  She had complained of postnasal drip and some nasal  stuffiness.  On examination, there was no evidence of postnasal discharge.  The patient was empirically treated with Nasonex nasal spray, and she did  report improvement in her symptoms.   She was seen in consultation by nutritionist, and that was discussed with  patient.  She expressed understanding.   DISCHARGE MEDICATIONS:  The patient is to continue previous home medications  of which I do not have the dosages including Amaryl, gemfibrozil,  __________, Protonix, Synthroid, Altace, Prozac, Celexa, Lorazepam.  Discharge medications are:  1.  Reglan 5 mg with meals and at bedtime.  2.  Nasonex nasal spray 1 each nostril daily.  3.  Vantin 200 mg 2 times a day for 3 more days.  4.  Phenergan 12.5 mg q.4-6h. p.r.n. nausea.   FOLLOW UP:  1.  Follow up with Dr. Arlyce Dice and Dr. Juanda Chance in 1 week after gastric      emptying test.  2.  Follow up with Dr. Eliberto Ivory in [place] in 1 to 2 weeks.  3.  Follow up with Dr. Isabel Caprice regarding history of urologic lesion.   DISCHARGE LABORATORY DATA:  White cell count 8.2, hemoglobin 12.2,  hematocrit 35.2, platelets 256.  Sodium 136, potassium 4.2, chloride 109,  bicarb 25, glucose 143, BUN 16, creatinine 1.2, calcium 9.  Fasting lipid  profile: Total cholesterol 47, HDL 22, LDL 60, triglycerides 325.  Hemoglobin A1c 6.8.  TSH 4.864.  Lipase 22.  Liver function tests were  within normal limits.     Mobolaji B. Corky Downs, M.D.  Electronically  Signed     MBB/MEDQ  D:  02/16/2005  T:  02/16/2005  Job:  [id number]   cc:   Barbette Hair. Arlyce Dice, M.D. [facility]  [address]  [place]  [place] [address]  Lina Sar, M.D. [facility]  [address]  [place]  [place] [address]   Dr. Eliberto Ivory, [facility]

## 2010-06-07 NOTE — H&P (Signed)
Cathy [name], TAVANO NO.:  [id number]   MEDICAL RECORD NO.:  [id number]          PATIENT TYPE:  INP   LOCATION:  1832                         FACILITY:  [facility]   PHYSICIAN:  Learta Codding, M.D. LHCDATE OF BIRTH:  1930/05/27   DATE OF ADMISSION:  03/01/2004  DATE OF DISCHARGE:                                HISTORY & PHYSICAL   REASON FOR ADMISSION:  A 77 year old female with known severe coronary  artery disease status post coronary artery bypass graft with recurrent chest  pain.   HISTORY OF PRESENT ILLNESS:  The patient is a 77 year old female well known  to Dr. [name].  The patient had multiple coronary interventions and  subsequently underwent revascularization surgeries. This was performed in  March 2003.  She also had an ascending aortic aneurysm which was resected  and was grafted with a Hemashield.  She underwent revascularization surgery  with internal mammary placed at the LAD, saphenous vein graft to the distal  right coronary, saphenous vein graft to obtuse marginal.  In the past, she  has had recurrent episodes of substernal chest pain.  She last underwent a  cardiac catheterization in October 2003 with details as outlined below.  The  patient more recently has been complaining of some substernal chest pain and  underwent in December a Cardiolite stress study in [place] at [facility].  We are currently waiting for the results, but the preliminary  result demonstrated that the patient had lateral reversible defect and a  partial reversible anteroseptal defect.  However, there was no ischemia in  inferior wall.  She also had an injection fraction of 44%.  A prior ejection  fraction in 2003 was 54%.   The patient is now admitted after complaint of not feeling well for several  days.  She has also complained of some nausea.  She has had longstanding  trouble with gastroparesis.  She now reports, however, left-sided chest  burning associated with  headaches and fatigue.  Early this morning around 7  o'clock she had developed anterior chest heaviness with radiation to the  back.  She states this has been different than anything else she has had  before.  She rates the pain as an 8/10 and was concerned about it and  immediately called 911.  She took two sublingual nitroglycerine with some  improvement.  She also developed shortness of breath, diaphoresis, and  nausea.  On admission to the emergency room, the patient is near pain free  after she was placed on nitroglycerin, and her EKG shows nonspecific  changes.   ALLERGIES:  1.  SEVERE CONTRAST ALLERGY.  2.  IMDUR.  3.  LIPITOR.   PAST MEDICAL HISTORY:  History of hypothyroidism, diabetes mellitus poorly  controlled including gastroparesis associated with nausea, hyperlipidemia,  hypertension, chronic renal insufficiency although current creatinine is  1.0.  Status post PCI to the LAD and RCA as well as obtuse marginal,  eventually followed by coronary artery bypass graft in 2003 with Hemashield  placement, LIMA to the LAD, saphenous vein graft to the ramus, and saphenous  vein graft to the right coronary artery.  The vein graft to the right  coronary artery comes off of the Hemashield graft.  Last cath in October  2003 with normal left ventricular function, intact Hemashield graft,  probably occluded graft to the obtuse marginal and 50% taper narrowing at  the ostium of the graft of the distal right coronary artery, patent internal  mammary artery with diffuse distal disease.   SOCIAL HISTORY:  The patient lives in [place] by herself.  She is retired  from [employer].  She is divorced.   FAMILY HISTORY:  Noncontributory.   REVIEW OF SYSTEMS:  A 6-pound weight gain in the last two months, needs  reading glasses, and has some hearing loss.  Positive chest pain, shortness  of breath as outlined above.  Occasional cough with white phlegm, dysuria,  and nocturia.   Generalized weakness, chronic nausea secondary to  gastroparesis and reflux symptoms.   PHYSICAL EXAMINATION:  GENERAL:  Well-nourished white female in no apparent  distress.  VITAL SIGNS:  Blood pressure 107/50, heart rate 67 beats/minute,  respirations 20, temperature 98.6.  HEENT:  Head:  NCAT.  PERRLA.  EOMI.  NECK:  Supple.  No carotid upstrokes.  No carotid bruits.  No definite  jugular venous distention.  HEART:  Distant heart sounds with regular rate and rhythm, normal S1, S2.  No pathologic murmur.  LUNGS:  Diminished breath sounds.  SKIN:  No rash or lesion.  ABDOMEN:  Soft and nontender.  GU/RECTAL:  Deferred.  EXTREMITIES:  No clubbing, cyanosis or edema.  NEURO:  The patient is alert and oriented.  Grossly nonfocal.   CHEST X-RAY:  Status post coronary artery bypass graft with no acute  changes.   EKG demonstrates normal sinus rhythm with rate of 68 and nonspecific T-wave  flattening and T-wave inversion in precordial leads.   LABORATORY DATA:  Hematocrit 38.6, white count 8.7, platelet count 305, BUN  19, creatinine 1.0, sodium 139, potassium 4.3, total bilirubin 0.7, AST 20,  ALT 16, INR 1.0.   PROBLEM LIST:  1.  Substernal chest pain, rule out acute coronary syndrome.      1.  Nonspecific T-wave changes.      2.  Initial troponin negative.  2.  Coronary artery disease, status post coronary artery bypass graft in      2003.      1.  Hemashield graft.      2.  Occluded graft of obtuse marginal.      3.  50% ostial tapering of the graft to the distal right coronary artery          coming off the Hemashield.      4.  Patent internal mammary artery with diffuse distal disease.      5.  History of normal left ventricular function, more recently 44% by          Cardiolite in December 2005.      6.  Abnormal Cardiolite with possible lateral ischemia, anteroseptal          ischemia.  3.  IVP Dye allergy. 4.  History of gastroparesis with chronic nausea.  5.   History of hypertension.   PLAN:  1.  The patient is a poor historian and is difficult to sort out if this      pain is related to an acute coronary syndrome or is nonspecific.  I      called Dr. [name] in the office and we  discussed the need to proceed      with cardiac catheterization.  2.  The patient will be pretreated with prednisone for dye allergy.  3.  The patient will be started on heparin and nitroglycerin, and she will      be continued on aspirin and Plavix.  4.  Diabetes mellitus.  Will provide sliding scale insulin and try to      control the patient's blood sugars appropriately.   I have discussed the risks and benefit of cardiac catheterization with the  patient and she is willing to proceed.      GED/MEDQ  D:  03/01/2004  T:  03/01/2004  Job:  [id number]   cc:   Arturo Morton. [name], M.D. [facility]

## 2010-06-07 NOTE — Cardiovascular Report (Signed)
[place]  Patient:    Cathy [name], FELTNER Visit Number: [id number] MRN: [id number]          Service Type: MED Location: [phone] Attending Physician:  Colon Branch Dictated by:   Arturo Morton [name], M.D. [facility] Proc. Date: 03/05/01 Admit Date:  03/04/2001 Discharge Date: 03/06/2001   CC:         Elvina Sidle, M.D.  Luis Abed, M.D. [facility]  Cardiac Catheterization Lab   Cardiac Catheterization  INDICATIONS:  [name] is a 77 year old white female well known to me.  She underwent previous percutaneous coronary intervention with a PTCA of a complicated right coronary many years ago.  She then subsequently had a stent placed to the left anterior descending artery which has remained patent. After that she had a stent placed to the ramus intermedius vessel and this has also remained patent.  More recently she developed a ruptured plaque in the proximal portion of the right coronary artery on a bend and this was stented. It had to be redilated and then subsequently repeat dilatation with brachytherapy administered.  She now has developed some recurrent chest discomfort and the current study was done to assess coronary anatomy. Importantly, she also has a history of profound contrast reaction with anaphylactic shock following x-ray dye.  She has not had any problems with pretreatment.  She was pretreated prior to coming to the lab today.  PROCEDURES: 1. Selective coronary arteriography. 2. Percutaneous transluminal coronary angioplasty of the right coronary    artery.  DESCRIPTION OF PROCEDURE:  The patient was brought to the catheterization lab and prepped and draped in the usual fashion.  Through an anterior puncture, the right femoral artery was easily entered.  A #6 French sheath was placed. The right coronary artery was injected using a left bypass graft catheter and the left with a standard Judkins catheter.  There appeared  to be a moderate stenosis in the mid portion of the previously treated stent with radiation therapy although the extent of the stent actually looked quite good.  As a result, the decision was made to reopen this short focal area after review with Dr. Chales Abrahams.  I went and spoke with the family and then talked to the patient who wanted to proceed.  We used intravenous heparin only as the patient was previously on Plavix.  The vessel was entered using a left bypass graft catheter with sideholes, #7 [place].  The ACT was just over 300 seconds. The lesion was crossed with a 0.014 Hi-Torque Floppy wire.  Two dilatations were performed with a 3.0- x 10-mm [employer] cutting balloon.  There was improvement in the stenosis from 75% to less than 20% with a good final angiographic appearance.  There was TIMI-3 flow in the distal vessel.  She was taken to the holding area in satisfactory clinical condition.  HEMODYNAMIC DATA:  Central aorta 179/81/120.  ANGIOGRAPHIC DATA: 1. The left main coronary artery was free of critical disease. 2. The LAD courses to the apex.  There is a previously placed stent just    proximal to the septal perforator in the proximal LAD.  There is no    evidence of significant restenosis at all in this location.  The vessel    beyond this has some luminal irregularity and there is probably 70% diffuse    segmental narrowing at the apical tip vessel just prior to wrapping the    apex.  Otherwise no areas of  high-grade stenosis are noted.  The    circumflex provides an AV circumflex which is essentially subtotally    occluded and then totally occluded.  There is a large marginal branch or    intermedius vessel that has been previously stented and the stent site has    about 30% area of smooth narrowing.  There is a steep bend in the vessel    beyond this point with perhaps 40-50% irregularity and some luminal    irregularity distally but this is a large caliber vessel and  free of    critical disease. 3. The right coronary artery is a very tortuous vessel.  Just before the    first bend in the mid portion of the previously placed stent and after    brachytherapy, there is a focal stenosis of about 70-75% just prior to    the bend.  Following the use of the cutting balloon, this was reduced to    less than 20%.  Distal to this location there are multiple areas of    bending and turning with about 30% narrowing at the previous site of    balloon angioplasty in the mid vessel.  The distal vessel is quite    tortuous and provides a small PDA and posterolateral system.  No ventriculography was done primarily because of the patients contrast reaction in the past and clinical impression that there would be unlikely any change in ventricular function with multiple catheterizations in the last year.  CONCLUSIONS: 1. Continued patency of the left anterior descending stent. 2. Continued patency of the ramus intermedius stent. 3. Continued patency of the mid right coronary artery. 4. Partial restenosis in the mid portion of the previously radiated stent    with successful repeat balloon dilatation.  PLAN: 1. Continue aspirin and Plavix. 2. Follow up with me in the office. Dictated by:   Arturo Morton [name], M.D. [facility] Attending Physician:  Colon Branch DD:  03/05/01 TD:  03/06/01 Job: [id number]

## 2010-06-07 NOTE — Consult Note (Signed)
Cathy [name], Cathy [name]                ACCOUNT NO.:  [id number]   MEDICAL RECORD NO.:  [id number]          PATIENT TYPE:  INP   LOCATION:  6529                         FACILITY:  [facility]   PHYSICIAN:  Rollene Rotunda, MD, FACCDATE OF BIRTH:  Mar 27, 1930   DATE OF CONSULTATION:  02/19/2006  DATE OF DISCHARGE:                                 CONSULTATION   PRIMARY CARE PHYSICIAN:  Dr. Eliberto Ivory.   CARDIOLOGIST:  Arturo Morton. [name], MD, [facility].   REQUESTING PHYSICIAN:  Lonia Blood, M.D.   REASON FOR CONSULTATION:  Evaluate patient with chest pain and coronary  disease.   HISTORY OF PRESENT ILLNESS:  This patient is a 77 year old white female  well known to Dr. [name].  She has a history of coronary disease status  post CABG as described below.  She was in her usual state of health  until approximately two to three weeks ago when she started having  headaches.  She said these are severe.  They have been associated with  nausea.  Yesterday, she had chest discomfort.  This was in the middle of  her chest.  It was similar to previous angina.  She took 2 sublingual  nitroglycerin without help.  She took a third one and then called 911.  She was treated with nitroglycerin spray without improvement.  She was  transported to [facility].  She said she continued to have pain for  quite a while and has slowly resolved after being admitted.  She said,  at peak, was a 6/10 in intensity.  There was some nausea.  There was  left arm discomfort.  She had no acute EKG changes.  There were  inferolateral T-wave inversions, however, these are unchanged from August 01, 2005, EKG which shows exactly the same appearance.  She has had no  elevation in initial cardiac enzymes.  She is currently pain-free.   The patient is very limited from back pain; however, with her activities  of walking through the house, she has not been able to bring on this  pain recently.   PAST MEDICAL HISTORY:  1. Hypertension.  2.  Hyperlipidemia.  3. Diabetes mellitus.  4. Gastroparesis.  5. Hypothyroidism.  6. Coronary artery disease status post three-vessel CABG (May 05, 2001).  LIMA to the LAD, SVG to OM, SVG to right coronary artery.  7. Last catheterization March 04, 2004, with an EF of 55%.  8. Occluded distal circumflex, occluded distal right coronary artery,      diffusely diseased mid-to-distal LAD, LIMA to the LAD was patent,      saphenous vein graft to the right coronary artery was patent with      mild disease and the saphenous vein graft to the ramus intermediate      was occluded.  There was 50% stenosis with the ramus intermediate.      EF was 55%.  9. She did have a Cardiolite in the last of January 2007 which      demonstrated no evidence of ischemia. Hemashield graft at the time  of bypass secondary to ascending aortic root aneurysm.  10.Hysterectomy.  11.Bladder tack.  12.Back surgery x2.  13.Breast surgery.  14.Carotid surgery.   ALLERGIES:  1. LIPITOR.  2. CITRUS CAUSES SWELLING.   MEDICATIONS:  1. Citalopram.  2. Plavix 75 mg daily.  3. Atenolol 50 mg daily.  4. Lorazepam.  5. Levothyroxine.  6. Amaryl 10 mg daily.  7. Vitamin B12.  8. Aspirin 325 mg daily.  9. Altace 5 mg daily.  10.Folic acid.  11.Furosemide.  12.Temazepam.  13.Lopid 600 mg b.i.d.  14.Hydrocodone.  15.Promethazine.   SOCIAL HISTORY:  She lives with grandson and wife.  She is retired.  She  is divorced.  She does not smoke cigarettes.   FAMILY HISTORY:  Predominant for cancer, although she states that she  has a brother with stents.   REVIEW OF SYSTEMS:  Positive for chills, profound weight gain secondary  to steroids, headaches, nasal discharge, hearing loss, wheezing, cough,  edema, urinary frequency, nocturia, profuse weakness, depression,  diffuse joint pains, gastroesophageal reflux disease, dysphagia,  constipation, polyuria.  Negative for other systems.   PHYSICAL EXAMINATION:   GENERAL:  The patient is in no acute distress.  VITAL SIGNS:  Blood pressure 136/48, heart rate 77 and regular,  afebrile.  HEENT:  Eyes are anicteric.  Pupils are equal, round, and reactive to  light.  Normal fundi.  No mucosal abnormality.  NECK:  No jugular venous distention.  Carotid upstroke brisk and  symmetrical.  No bruits, thyromegaly.  LYMPHATICS:  No cervical, axillary, inguinal adenopathy.  LUNGS:  Clear to auscultation bilaterally.  CHEST:  Symmetrical chest rise.  HEART:  PMI not displaced. Normal S1 and S2 within normal limits.  No  S3, no S4, no murmurs, clicks, rubs, gallops.  ABDOMEN:  Obese.  Positive bowel sounds, no bruits, no rebound, no mass,  hepato or splenomegaly.  SKIN:  No rashes.  EXTREMITIES:  2+ pulses.  No cyanosis.  No clubbing.  No edema.  NEURO:  Motor grossly intact Cranial nerves II-XII grossly intact.   EKG:  Sinus rhythm with a rate of 70, axis within normal limits,  intervals were within normal limits, lateral T-wave inversion unchanged  from the EKG noted in 2005/08/03.  Slightly more pronounced in an EKG  done earlier today.   LABS:  WBC 11.6, hemoglobin 14.0, sodium 139, potassium 4.0, BUN 27,  creatinine 1.0, CK MB negative x2, troponin negative x2, INR 0.92.   ASSESSMENT/PLAN:  1. The patient's chest discomfort is worrisome for angina.  However,      it is very similar to the previous pain that she had in 2007 when      she had a negative profusion study.  In addition, there are no      objective findings with negative cardiac enzymes.  Though her EKG      is abnormal, it is not changed from EKGs she has had previously      when she was not having active ischemia.  Therefore, at this point      I think the probability of chronic obstructive pulmonary disease      leading to her symptoms is low moderate.  I think a stress     profusion study would be very helpful in this situation.  The      patient would very much like to avoid a  catheterization.  I agree      with that is not the indicated procedure at this  point.  The      patient will have a stress profusion study and further evaluation      based on this.  2. Headache:  This is being evaluated by the primary team with a head      CT.  3. Hypertension which is currently well controlled.  She will continue      medications as listed.  4. Risk reduction:  She will have this followed by Dr. Lavera Guise with      management of diabetes and dyslipidemia.      Rollene Rotunda, MD, [facility]  Electronically Signed     JH/MEDQ  D:  02/19/2006  T:  02/20/2006  Job:  [id number]   cc:   Lonia Blood, M.D.

## 2010-06-07 NOTE — Discharge Summary (Signed)
Cathy [name], Cathy [name]                          ACCOUNT NO.:  [id number]   MEDICAL RECORD NO.:  [id number]                   PATIENT TYPE:  INP   LOCATION:  4733                                 FACILITY:  [facility]   PHYSICIAN:  Arturo Morton. [name], M.D. [facility]         DATE OF BIRTH:  1930-10-02   DATE OF ADMISSION:  01/04/2002  DATE OF DISCHARGE:  01/07/2002                           DISCHARGE SUMMARY - REFERRING   SUMMARY OF HISTORY:  The patient is a 77 year old female who has been under  increased stress secondary to illness and the death of her sister who died  two days prior to her admission.  She began having substernal chest  discomfort associated with shortness of breath, nausea, and diaphoresis.  She obtained temporary relief from nitroglycerine but continued to have  symptoms, thus she came to the emergency room.  She gave it an 8 on a scale  of 0-10.  She has also noticed general malaise, chills.  Her discomfort  seems to be worse with deep inspiration and palpitation.  She has not had  any relief with Darvocet either.  Her history is notable for coronary artery  disease.  Her last catheterization on November 19, 2001, showed  1-2+ MR, normal LV function, her grafts were intact.  She had a total LAD.  She also has a history of a three-vessel bypass.  Surgery in April of 2003  LIMA to the LAD, saphenous vein graft to the RCA, saphenous vein graft to  the RI, and a triple AAA repair.  She also has a history of diabetes,  hypertension, hyperlipidemia, osteoarthritis, GERD, recent shingles, and  diabetes.   DIAGNOSTIC DATA:  Admission EKG showed normal sinus rhythm, nonspecific ST-T  wave changes.  Chest x-ray showed cardiomegaly, prior bypass grafting,  bilateral basilar atelectasis, left greater than right.   LABORATORY DATA:  __________  3, CK-MB as in troponins were negative for  myocardial infarction.  Urinalysis discharge moderate amount of leukocyte  esterase and greater than  100,000 enterococcus species.  Admission sodium  was 137, potassium 3.9, BUN 12, creatinine 0.9, glucose 181, H&H 13.4 and  39.0, normal indices, platelets 229, WBCs 11.4.   HOSPITAL COURSE:  The patient was admitted to 89.  She continued to have  chest discomfort.  EKGs were unremarkable.  Sublingual nitroglycerin made a  slight difference in her discomfort.  By the morning of the 17th she was  continuing to have chest discomfort.  Dr. [name] felt that she may have had  a mild case of the flu.  By December 18 her chest discomfort was better.  Her activity was increased.  On December 19 she had been afebrile for 24  hours.  Dr. [name] notes that her brother had spoke to him and felt that  this was anxiety related.  Dr. [name] also felt that she had a probable  asymptomatic UTI and was started on ampicillin and felt that  she could be  discharged home.   DISCHARGE DIAGNOSES:  1. Prolonged chest discomfort of undetermined etiology.  2. Increased stressors secondary to sister's death.  3. Probably asymptomatic urinary tract infection.  4. History as previously.   DISPOSITION:  She received a new prescription for ampicillin 250 mg four x a  day for seven days.  She was asked to increase her Norvasc to 5 mg daily.  She was asked to continue her other medications which include coated aspirin  325 mg daily, Lopressor 50 mg 1/2 tablet b.i.d., Lipitor 10 mg q.h.s.,  Prilosec 20 mg daily, KCl 10 mEq daily, Synthroid 112 mcg 1/2 daily,  Glucotrol XL 5 mg b.i.d., Prozac 20 mg daily, Demadex 20 mg daily,  Glucophage 500 mg q.h.s., Humulin N 36 units q.a.m., 20 units q.p.m.,  multivitamin, Darvocet as previously.  She was asked to continue a low salt,  fat, and cholesterol ADA diet, to arrange a follow up appointment  with Dr.  Eliberto Ivory as well as a three to four week appointment with Dr. [name].     Joellyn Rued, P.A. [facility]                    Thomas D. [name], M.D. [facility]    EW/MEDQ  D:   02/15/2002  T:  02/15/2002  Job:  [id number]   cc:   Eliberto Ivory, M.D.  [facility]  [address]   [place] D. [name], M.D. [facility]  [address]  [place]  [place] [address]  Fax: 1

## 2010-06-07 NOTE — Discharge Summary (Signed)
[place]. [facility]  Patient:    Cathy [name], Cathy [name]                        MRN: [id number] Adm. Date:  02/10/99 Disc. Date: 02/15/99 Attending:  Dietrich Pates, M.D. [facility] Dictator:   Lavella Hammock, P.A.-C. CC:         Arturo Morton. [name], M.D. [facility]             Dian Queen, P.A.-C. [facility]             Elvina Sidle, M.D.                           Discharge Summary  DATE OF BIRTH:  Aug 13, 1930  PROCEDURE: 1. Cardiac catheterization. 2. Coronary arteriogram. 3. PTCA and stent of one vessel.  HISTORY OF PRESENT ILLNESS:  Cathy [name] is a 77 year old female, a patient of Dr. Maisie Fus D. Stuckeys with a history of coronary artery disease, who complained of a one-month history of exertional chest tightness, associated with shortness of breath.  She had the tightness on the day of admission after eating breakfast, nd that was associated with shortness of breath, nausea, and vomiting, and was eased with sublingual nitroglycerin.  HOSPITAL COURSE: Because she was continuing to have pain, and because her enzymes were positive for MS, she was taken urgently to the cardiac catheterization laboratory.  She had a cardiac catheterization done but she had a contrast reaction that was associated with hypotension, decreased level of consciousness, and several runs of ventricular tachycardia.  She had multiple cardioversions. Epinephrine mg was given, and a balloon pump was inserted.  She stabilized after that, breathing well on 100% O2 by nonrebreather mask.  She was started on steroids and Pepcid V. Plavix was given.  She was also started on Integrilin.  She was pretreated for contrast dye allergy and taken back to the catheterization laboratory on February 13, 1999, where she had a PTCA and stent of the OM-I, reducing the stenosis from 95% to less than 10%.  Her circumflex was totaled in the midportion, with an 80% proximal lesion.  There was a 50% distal LAD  stenosis and in the OM, additional  stenoses of 30%, 50%, 70%.  The RCA was not engaged, and the LV-gram was not done, secondary to the contrast reaction.  She tolerated the procedure well and the sheath was removed without difficulty.  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  The next day she was ambulating well with cardiac rehabilitation and was given instructions on temperature precautions and ______  for exercise, as well as use of nitroglycerin.  The patient was assisted with medications by the hospital with her Plavix, and er sister was to stay with her to assist her as an outpatient.  DISPOSITION:  She is to be discharged home on February 15, 1999, to follow up in the office with a repeat ultrasound in six to eight weeks, to assess the fistula.  LABORATORY DATA:  Hemoglobin 11.0, hematocrit 31.8, wbcs 15,000, platelets 112.  Sodium 141, potassium 4.1, chloride 109, CO2 of 26, BUN 17, creatinine 0.8, glucose of 203.  Repeat CBC showed white count of 12,200 with a hemoglobin of 10.2, and a hematocrit of 29.3 post-procedure.  Platelets were stable at 121,000.  CPK-MB initial 169/17.6,  with troponin of 0.18, peaked at 1268/199.2, with a troponin f 4.18.  TSH 1.208.  Chest x-ray:  Mild cardiomegaly, no acute pulmonary disease.  CONDITION ON DISCHARGE:  Improved.  CONSULTATION:  Anesthesia.  DISCHARGE DIAGNOSES:  1. Acute myocardial infarction, status post percutaneous transluminal coronary     angioplasty and stent to the obtuse marginal-I.  2. Residual coronary artery disease with totaled circumflex and distal disease     in the obtuse marginal-I and the right coronary artery.  3. Echocardiogram done on February 14, 1999, showing posterior hypokinesis     with overall mildly reduced left ventricular function and an ejection fraction     of 50%.  Mild aortic sclerosis.   Mild mitral regurgitation.  No pulmonary     hypertension.  4. Status post anaphylactic intravenous contrast reaction.  5. Status post percutaneous transluminal coronary angioplasty and stent to     the left anterior descending coronary artery in 1998.  6. Non-insulin-dependent diabetes mellitus.  7. Hypertension.  8. Hyperlipidemia.  9. History of hypothyroidism. 10. History of anxiety and depression. 11. History of surgery for small bowel obstruction. 12. History of fibrocystic breast disease. 13. History of endometriosis. 14. History of a laminectomy. 15. History of a hysterectomy.  DISCHARGE INSTRUCTIONS: 1. Activity level is to include no strenuous activity and no driving. 2. She is to call the office for bleeding, swelling, or drainage at the    cardiac catheterization site. 3. She is to stick to a low-fat diet. 4. She is to follow up with outpatient diabetes, and they will contact her.  FOLLOWUP:  She has an appointment with Dian Queen, P.A.-C. on Friday, March 01, 1999, at noon.  She is to set up an appointment with Dr. Arturo Morton. Stuckey at that time.  She is to follow up with Dr. Elvina Sidle p.r.n.  DISCHARGE MEDICATIONS: 1. Coated aspirin 325 mg q.d. 2. Prozac 20 mg q.d. 3. Synthroid 112 mcg q.d. 4. Premarin 0.625 mg q.d. 5. Atacand 16 mg q.d. 6. Plavix 75 mg q.d. 7. Zantac p.r.n. 8. Lopressor 50 mg 1/2 tablet b.i.d. 9. Nitroglycerin 0.4 mg sublingual p.r.n. DD:  02/15/99 TD:  02/15/99 Job: [id number]

## 2010-06-07 NOTE — Cardiovascular Report (Signed)
[place]. [facility]  Patient:    Cathy [name], Cathy [name] Visit Number: [id number] MRN: [id number]          Service Type: MED Location: [facility] [id number] Attending Physician:  Talitha Givens Dictated by:   Veneda Melter, M.D. Proc. Date: 11/17/00 Admit Date:  11/11/2000 Discharge Date: 11/18/2000   CC:         Jonelle Sidle, M.D. [facility]  Elvina Sidle, M.D.   Cardiac Catheterization  PROCEDURES PERFORMED: 1. Selective coronary angiography. 2. Percutaneous transluminal coronary angioplasty of the right coronary    artery. 3. Adjuvant brachytherapy.  DIAGNOSES: 1. Coronary artery disease. 2. Unstable angina. 3. In-stent re-stenosis.  INDICATIONS: The patient is a 77 year old white female with coronary artery disease and diabetes mellitus, who has previously undergone percutaneous intervention of the right coronary artery in July of 2002. She subsequently had re-stenosis and underwent percutaneous intervention with stent placement August 2002 and was readmitted to the hospital with unstable angina. She underwent cardiac catheterization on October 24 showing re-stenosis of the right coronary artery within the area of previous stenting. Treatment options were discussed with the patient and she elected to proceed with percutaneous intervention with adjuvant brachytherapy.  TECHNIQUE: After informed consent was obtained, the patient was brought to the cardiac catheterization lab and a 7 French sheath was placed in the right femoral artery. A 7 [place] AL-2 guide catheter was then used to engage the right coronary artery and selective guide shots obtained using manual injections of contrast. This confirmed the presence of high-grade narrowing of 90% in the proximal right coronary artery within the area of previous stenting.  There was significant underfilling of the distal vasculature. The patient had been pretreated with aspirin and Plavix. She was given  heparin on a weight-adjusted basis to maintain ACT of approximately 300 seconds.  A 0.014 extra-support wire was then advanced into the proximal right coronary artery. However, due to tortuosity of the vessel the anomalous takeoff of the anterior cusp and poor guide support. This again proved difficult and end-hole catheter was used for support. The wire could be successfully negotiated in the mid section of the vessel. Transcatheter was removed and a 3.0 x 10 mm Cutting Balloon introduced. A total of one inflation at 6 atmospheres for 30 seconds was performed in the distal segment and then 8 inflations at 10 atmospheres to 30 seconds along the entire course of the stent. Repeat angiography showed significant improvement in vessel lumen.  There was moderate residual stenosis of 30-40% in the mid section of the stented segment that proved resistant to dilatation. There was no evidence of vessel damage and significantly improved flow through the distal vasculature. This was deemed acceptable. The 30 mm Novoste Beta-Cath system was then introduced and carefully centered across the centered segment and the beta radiation beads delivered for a dwell time of 3 minutes and 3 seconds. These were then retracted under cineangiography and the catheter removed as well. Repeat angiography was then performed showing no evidence of distal vessel damage with TIMI-3 flow through the right coronary artery. The guide catheter was then removed and the sheath secured into position. The patient tolerated the procedure well and was transferred to the ward in stable condition.  FINAL RESULTS: Successful percutaneous transluminal coronary angioplasty of the proximal right coronary artery with reduction of 90% in-stent re-stenosis to approximately 30% using a 3.0 mm Cutting Balloon with adjuvant beta radiation brachytherapy. Dictated by:   Veneda Melter, M.D. Attending Physician:  Talitha Givens DD:   11/17/00 TD:  11/18/00 Job: [id number]

## 2010-06-07 NOTE — H&P (Signed)
Cathy [name], Cathy [name]                ACCOUNT NO.:  [id number]   MEDICAL RECORD NO.:  [id number]           PATIENT TYPE:   LOCATION:                                 FACILITY:   PHYSICIAN:  Lonia Blood, M.D.       DATE OF BIRTH:  1930-06-26   DATE OF ADMISSION:  02/10/2005  DATE OF DISCHARGE:                                HISTORY & PHYSICAL   CHIEF COMPLAINT:  Chest pain.   Patient is an unassigned.   HISTORY OF PRESENT ILLNESS:  Cathy [name] is a 77 year old Caucasian woman  with extensive past medical history of coronary artery disease status post  coronary artery bypass grafting in 2003 with last cardiac catheterization in  February of 2006.  At the time of the cardiac catheterization she was found  to have a patent stent to the LAD, preserved left ventricular ejection  fraction, and patent grafts.  Cathy [name] comes to [facility] Emergency Room  relating the fact that on Christmas Day she ate a grapefruit.  After she did  eat that grapefruit ever since then she has been having intermittent  episodes of chest pain at rest.  She has been having constant nausea, not  able to eat.  She has been having wild swings in her CBGs with mostly  hypoglycemia.  She has been having ongoing vertigo and she has not been able  to leave the house since then because she felt to sick.   PAST MEDICAL HISTORY:  1.  Coronary artery disease status post a CABG in 2003.  2.  Gastroparesis.  3.  Hypertension.  4.  Hyperlipidemia.  5.  Hypothyroidism.  6.  Diabetes mellitus type 2.  7.  Cholelithiasis.  8.  Hysterectomy.  9.  Endometriosis.  10. History of breast surgeries.  11. Two left ear surgeries for decreased hearing acuity.   FAMILY HISTORY:  Noncontributory.   SOCIAL HISTORY:  Patient lives alone.  She is retired from [employer].  She is divorced.  She has two sons.  She does not drink alcohol.  She does  not smoke cigarettes.   MEDICATIONS:  1.  Aspirin 325 mg daily.  2.  Synthroid half  of a 112 mcg tablet.  3.  Altace at a dose of 5 mg daily.  4.  Plavix 75 mg daily.  5.  Prozac unknown dose.  6.  Celexa 20 mg daily.  7.  Norvasc 5 mg daily.  8.  Lorazepam 1 mg daily.  9.  Amaryl 4 mg daily.  10. Folic acid 1 mg daily.  11. Atenolol 50 mg twice a day.  12. Lantus 100 units at bedtime and 90 units in the morning.  13. Amaryl 4 mg daily.  14. Protonix 40 mg daily.  15. Nitroglycerin p.r.n. for chest pain.  16. Torsemide 20 mg Monday, Wednesday, and Friday.  17. Gemfibrozil 600 mg twice a day.  18. Hydrocodone p.r.n. for pain.   REVIEW OF SYSTEMS:  Positive for vertigo.  Positive for nausea.  Negative  for abdominal pain.  Positive for left ear pain.  Positive for the episodes  of chest pain described in the HPI.  Positive for his episodes of  hypoglycemia.   PHYSICAL EXAMINATION:  VITAL SIGNS:  Temperature 97.6, pulse 65,  respirations 18, blood pressure 106/60, saturation 99% on room air.  GENERAL:  Cathy [name] is in no acute distress.  Alert, oriented.  Eating  happy meal.  HEENT:  Head is normocephalic, atraumatic.  Eyes have pupils are equal,  round, and reactive to light and accommodation.  Extraocular movements  intact.  Throat is clear.  Her left ear has clear ear canal, intact pinna.  No foreign objects are seen.  NECK:  Supple without JVD.  No carotid bruits.  CHEST:  Clear to auscultation bilaterally without wheezes, rhonchi, or  crackles.  HEART:  Regular rate and rhythm without murmurs, rubs, or gallops.  ABDOMEN:  Soft, nontender, nondistended.  Bowel sounds are present.  EXTREMITIES:  No edema.  Pulses are present bilaterally.  SKIN:  Warm and dry.  NEUROLOGIC:  Nonfocal.  LYMPH:  Patient has no palpable lymphadenopathy.  MUSCULOSKELETAL:  She has good muscle bulk and tone.  Full range of motion  in all four limbs.   ADMISSION LABORATORIES:  White blood cell count 10,000, hemoglobin 13.5,  platelet count 333.  Sodium 139, potassium 4.2,  chloride 109, BUN 20,  creatinine 1.  LFTs are within normal limits.  PT is 13.2, INR is 1.  Glucose 63.  EKG shows lateral T-wave inversion on leads V5-V6 with some ST  depression in leads V5-V6 which is chronic in nature.   ASSESSMENT/PLAN:  1.  Chest pain.  Will admit the patient to telemetry unit, rule out      myocardial infarction, obtain a cardiac consultation in a.m.  The      patient is requesting to see Dr. [name].  2.  History of cholelithiasis.  At this point patient appears to be      asymptomatic.  She is requesting surgery so at this point I will proceed      with a HIDA scan and if she does not have any signs of cholecystitis I      will defer the surgical option.  3.  Persistent nausea.  I do suspect this might be related to gastroparesis.      Will obtain a nuclear magnetic emptying study.  4.  Vertigo.  This most likely is related to the fact that the patient has      imbalance in her hearing acuity, but also patient is complaining of      frequent episodes of headaches and some difficulty with walking.  Plan      is to obtain an MRI of the brain, rule out an acoustic neuroma, rule out      any small vessel disease that may have caused like a pontine stroke.  5.  Diabetes mellitus with frequent episodes of hypoglycemia.  Will decrease      the dose of Lantus, use sliding scale insulin and will discontinue the      Amaryl and adjust the Lantus as needed.  6.  Hypothyroidism.  Will check a TSH.  Use current dose of Synthroid.      Lonia Blood, M.D.  Electronically Signed     SL/MEDQ  D:  02/10/2005  T:  02/10/2005  Job:  [id number]

## 2010-06-07 NOTE — Cardiovascular Report (Signed)
[place]. [facility]  Patient:    Cathy [name], Cathy [name] Visit Number: [id number] MRN: [id number]          Service Type: MED Location: [phone] Attending Physician:  Talitha Givens Dictated by:   Veneda Melter, M.D. Proc. Date: 09/10/00 Admit Date:  09/09/2000   CC:         Maisie Fus D. [name], M.D. [facility]             Elvina Sidle, M.D.                        Cardiac Catheterization  PROCEDURE PERFORMED:  Percutaneous transluminal coronary angioplasty and stenting of the proximal right coronary artery.  DIAGNOSES: 1. Severe single-vessel coronary artery disease. 2. Re-stenosis of prior angioplasty site. 3. Unstable angina.  INDICATIONS:  The patient is a 77 year old female, with a history of coronary artery disease, status post multiple percutaneous interventions in the past. She most recently has had angioplasty performed to the proximal right coronary artery on August 14, 2000.  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  Unfortunately, she presents with recurrence of substernal chest discomfort and cardiac catheterization by Dr. Diona Browner showed re-stenosis of the prior angioplasty site with recurrent disease of 70% in the proximal RCA followed by a high-grade narrowing of 90% in the proximal bend.  She presents now for percutaneous intervention.  TECHNIQUE:  Informed consent was obtained, and existing 6 French sheath was exchanged over a wire for a 7 [place] sheath.  The patient previously had been treated with aspirin and Plavix and she was given heparin to maintain ACT of greater than 250 seconds.  A 7 [place] AL-1 guide catheter was used to engage the right coronary artery and a 0.014 inch BMW wire was advanced into the right coronary artery.  A 2.5 x 15 mm OpenSail balloon was introduced and used for further support to advance the wire into the distal section of the vessel. The OpenSail balloon  was then used to pre-dilate the lesion at 8 atmospheres for 60 seconds.  Repeat angiography shows significant improvement in vessel lumen and blood flow.  It was felt that stenting would be necessary to prevent further re-stenosis and a 2.5 x 13 mm Pixel stent was introduced.  This was positioned extending right up to a RV marginal branch arising from the mid section of the vessel immediately following the proximal bend and deployed at 12 atmospheres for 60 seconds.  Repeat angiography after the administration of intracoronary nitroglycerin showed an excellent result.  The vessel now appeared much larger than previously appreciated.  There was also evidence of residual disease in the proximal vessel prior to the area of stenting.  We elected to cover this with a 3.0 x 8 mm Penta stent extending from an atrial branch to the previously placed stent with slight overlap.  This was deployed at 12 atmospheres for 60 seconds.  The stent delivery system was advanced slightly and used to further dilate the previously placed Pixel stent.  Two inflations were performed at 8 atmospheres for 30 seconds within the distal and mid sections of the stent.  Repeat angiography was then performed after the administration of intracoronary nitroglycerin showing an excellent result with no residual stenosis in the area of stenting.  There is moderate residual disease of 30-50% in the proximal RCA just prior to the area of stenting. This was not considered critical.  There  was TIMI-3 flow through the right coronary artery and no evidence of distal vessel damage.  The guide catheters were then removed and the sheath secured in position.  The patient tolerated the procedure well and was transferred to the ward in stable condition.  FINAL RESULTS:  Successful percutaneous transluminal coronary angioplasty and stenting in the proximal right coronary artery with reduction of 70 and 90% narrowings to approximately 0%  with placement of a 3.0 x 8 mm Penta stent followed by a 2.5 x 13 mm Pixel stent dilated to 3.0 mm. Dictated by:   Veneda Melter, M.D. Attending Physician:  Talitha Givens DD:  09/10/00 TD:  09/11/00 Job: [id number]

## 2010-06-07 NOTE — Discharge Summary (Signed)
[place]. [facility]  Patient:    Cathy [name], Cathy [name] Visit Number: [id number] MRN: [id number]          Service Type: MED Location: [phone] Attending Physician:  Colon Branch Dictated by:   Joellyn Rued, P.A.-C. Admit Date:  03/04/2001 Discharge Date: 03/06/2001   CC:         Arturo Morton. [name], M.D. [facility]  M. Linna Darner, M.D.  Elvina Sidle, M.D.   Referring Physician Discharge [place]  DATE OF BIRTH:  [birth year]  SUMMARY OF HISTORY:  Cathy [name] is a 77 year old white female with multiple histories of coronary artery interventions by Dr. [name].  She presented to [facility] with a burning, heavy sensation to the left side of her chest, radiating into her left shoulder and left neck, exacerbated by household chores, associated with fatigue and nausea.  She feels that this is like her prior angina.  She has taken 2 sublingual nitroglycerins and rest with some relief.  Her history is also notable for hypertension, diabetes, hyperlipidemia.  LABORATORY DATA:  Transfer from [facility], CKs and troponins were negative for myocardial infarction.  Admission H&H was 14.3 and 43.1, normal indices, platelets 212, WBC 7.8.  It is noted that fasting lipids and TSH were drawn at [facility], and they are pending at the time of this dictation.  Sodium was 140, potassium 3.9, BUN 20, creatinine .9, glucose 83. Postprocedure sodium was 142, potassium 3.9, BUN 19, creatinine 1.1, glucose 181.  H&H 12.9 and 37.7, normal indices, platelets 196, WBC 9.8.  Chest x-ray did not show any significant change from September 09, 2000 at [place].  Ultrasound of the abdomen did not show any evidence of gallbladder disease.  EKG showed sinus bradycardia, normal sinus rhythm, nonspecific ST-T wave changes.  HOSPITAL COURSE:  Ms. Morikawa was transferred to [facility] on March 04, 2001, to undergo cardiac catheterization.  She revealed dye allergy  prophylaxis.  On March 05, 2001, Dr. [name] performed cardiac catheterization.  According to his progress note, she had a 20% proximal LAD, distal 70% LAD, 30% circumflex, 90% branch off the circumflex which was totally occluded.  Distal 50% circumflex.  The RCA in the proximal portion had a greater than 75% lesion, mid 30% lesion.  He performed angioplasty with a cutting balloon to the proximal RCA.  Postsheath removal and bed rest, she did well.  On the morning of the 15th after reviewing the chart, Dr. Eden Emms felt she could be discharged home.  DISCHARGE DIAGNOSES: 1. Unstable angina. 2. Progressive coronary artery disease, status post angioplasty with a cutting    balloon to the proximal right coronary artery. 3. History as previously.  DISPOSITION:  She is discharged home.  DISCHARGE MEDICATIONS:  1. Lipitor 40 mg q.h.s.  2. Nexium 40 mg p.r.n.  3. Coated aspirin 325 q.d.  4. Bextra 10 mg q.d.  5. Actos 45 mg q.d.  6. Levothyroxine 0.112 mg q.d.  7. Glucotrol XL 5 mg q.d.  8. Prozac 20 q.d.  9. Lorazepam 1 mg q.h.s. 10. Aceon 4 mg q.d. 11. Toprol XL 25 mg q.d. 12. Multivitamin q.d. 13. Sublingual nitroglycerin p.r.n. 14. Plavix 75 mg q.d.  INSTRUCTIONS: 1. She was advised no lifting, driving, sexual activity, or heavy exertion for    two days. 2. Maintain a low salt/fat/cholesterol ADA diet. 3. If she has any problems with her catheterization site, she was asked to    call. 4. She was asked to call  on Monday our [place] office to arrange a follow-up two appointment with Dr. [name]. 5. She was given the option to be followed in [place], but she prefers to continue in [place]. Dictated by:   Joellyn Rued, P.A.-C. Attending Physician:  Colon Branch DD:  03/06/01 TD:  03/06/01 Job: [id number]

## 2010-06-25 ENCOUNTER — Ambulatory Visit: Payer: Self-pay | Admitting: Cardiology

## 2010-07-02 ENCOUNTER — Encounter: Payer: Self-pay | Admitting: Cardiology

## 2010-07-18 ENCOUNTER — Encounter: Payer: Self-pay | Admitting: Cardiology

## 2010-07-18 DIAGNOSIS — Z951 Presence of aortocoronary bypass graft: Secondary | ICD-10-CM | POA: Insufficient documentation

## 2010-07-18 DIAGNOSIS — Z789 Other specified health status: Secondary | ICD-10-CM | POA: Insufficient documentation

## 2010-07-18 DIAGNOSIS — F329 Major depressive disorder, single episode, unspecified: Secondary | ICD-10-CM | POA: Insufficient documentation

## 2010-07-18 DIAGNOSIS — R55 Syncope and collapse: Secondary | ICD-10-CM | POA: Insufficient documentation

## 2010-07-18 DIAGNOSIS — I729 Aneurysm of unspecified site: Secondary | ICD-10-CM | POA: Insufficient documentation

## 2010-07-18 DIAGNOSIS — F32A Depression, unspecified: Secondary | ICD-10-CM | POA: Insufficient documentation

## 2010-07-18 DIAGNOSIS — E785 Hyperlipidemia, unspecified: Secondary | ICD-10-CM | POA: Insufficient documentation

## 2010-07-18 DIAGNOSIS — Z91041 Radiographic dye allergy status: Secondary | ICD-10-CM | POA: Insufficient documentation

## 2010-07-18 DIAGNOSIS — I10 Essential (primary) hypertension: Secondary | ICD-10-CM | POA: Insufficient documentation

## 2010-07-18 DIAGNOSIS — K3184 Gastroparesis: Secondary | ICD-10-CM | POA: Insufficient documentation

## 2010-07-19 ENCOUNTER — Encounter: Payer: Self-pay | Admitting: Cardiology

## 2010-07-19 ENCOUNTER — Ambulatory Visit (INDEPENDENT_AMBULATORY_CARE_PROVIDER_SITE_OTHER): Payer: Medicare Other | Admitting: Cardiology

## 2010-07-19 DIAGNOSIS — I2510 Atherosclerotic heart disease of native coronary artery without angina pectoris: Secondary | ICD-10-CM

## 2010-07-19 DIAGNOSIS — I10 Essential (primary) hypertension: Secondary | ICD-10-CM

## 2010-07-19 DIAGNOSIS — R55 Syncope and collapse: Secondary | ICD-10-CM

## 2010-07-19 MED ORDER — CLOPIDOGREL BISULFATE 75 MG PO TABS: 75.0000 mg | ORAL_TABLET | Freq: Every day | ORAL | Status: DC

## 2010-07-19 NOTE — Assessment & Plan Note (Signed)
She has not had any dizziness or recurrent syncope.  No further workup.

## 2010-07-19 NOTE — Patient Instructions (Addendum)
Your physician wants you to follow-up in: 6 months. You will receive a reminder letter in the mail one-two months in advance. If you don't receive a letter, please call our office to schedule the follow-up appointment. Your physician recommends that you continue on your current medications as directed. Please refer to the Current Medication list given to you today. Blood pressure is okay as long as top number is around 120/160. It is okay to be a little above or below.

## 2010-07-19 NOTE — Progress Notes (Signed)
HPI Patient is seen for followup of coronary disease.  I saw her last, 2012.  She is not having any chest pain.  She's had some difficulty with her watering eyes.  She also has some musculoskeletal pain in her left arm and shoulder.  She is not having any significant shortness of breath Allergies  Allergen Reactions  . Atorvastatin   . Citrus Dermatitis    BREAK OUT ON BODY  . Iodine   . Iohexol      Desc: ANAPHYLAXIS   . Zolpidem Tartrate     REACTION: hallucinations    Current Outpatient Prescriptions  Medication Sig Dispense Refill  . amLODipine (NORVASC) 5 MG tablet Take 5 mg by mouth daily.        [name] aspirin 325 MG tablet Take 325 mg by mouth daily.        . calcium carbonate (TUMS) 500 MG chewable tablet Chew 1 tablet by mouth as needed.        . citalopram (CELEXA) 20 MG tablet Take 20 mg by mouth daily.        . clopidogrel (PLAVIX) 75 MG tablet Take 75 mg by mouth daily.        . folic acid (FOLVITE) 1 MG tablet Take 1 mg by mouth daily.        [name] gabapentin (NEURONTIN) 300 MG capsule Take 300 mg by mouth at bedtime.        [name] glimepiride (AMARYL) 2 MG tablet Take 4 mg by mouth at bedtime.       [name] glucose blood test strip 1 each by Other route daily. Use as instructed       . hydrOXYzine (ATARAX) 25 MG tablet Take 25 mg by mouth 3 (three) times daily as needed.        . insulin glargine (LANTUS) 100 UNIT/ML injection Inject 90 Units into the skin 2 (two) times daily.        . insulin lispro (HUMALOG) 100 UNIT/ML injection Inject into the skin daily.        . isosorbide mononitrate (IMDUR) 30 MG 24 hr tablet Take 60 mg by mouth daily.        [name] levothyroxine (SYNTHROID, LEVOTHROID) 112 MCG tablet Take 56 mcg by mouth daily.        . meclizine (ANTIVERT) 25 MG tablet Take 25 mg by mouth as needed.        . metFORMIN (GLUMETZA) 500 MG (MOD) 24 hr tablet Take 500 mg by mouth 2 (two) times daily.        . minocycline (MINOCIN,DYNACIN) 50 MG capsule Take 50 mg by mouth 2 (two) times  daily.        . nitroGLYCERIN (NITROSTAT) 0.4 MG SL tablet Place 0.4 mg under the tongue every 5 (five) minutes as needed. May repeat times three.       . ramipril (ALTACE) 5 MG capsule Take 5 mg by mouth daily.        [name] acetaminophen (TYLENOL) 650 MG CR tablet Take 650 mg by mouth every 8 (eight) hours as needed.        [name] atenolol (TENORMIN) 100 MG tablet Take 100 mg by mouth daily.        [name] docusate sodium (STOOL SOFTENER) 100 MG capsule Take 200 mg by mouth daily as needed.        [name] gemfibrozil (LOPID) 600 MG tablet Take 600 mg by mouth daily.        . Melatonin  5 MG TABS Take 10 mg by mouth at bedtime.        . multivitamin (THERAGRAN) per tablet Take 1 tablet by mouth daily.        . vitamin B-12 (CYANOCOBALAMIN) 500 MCG tablet Take 500 mcg by mouth daily.          History   Social History  . Marital Status: Divorced    Spouse Name: N/A    Number of Children: N/A  . Years of Education: N/A   Occupational History  . Retired    Social History Main Topics  . Smoking status: Never Smoker   . Smokeless tobacco: Not on file  . Alcohol Use: No  . Drug Use: Not on file  . Sexually Active: Not on file   Other Topics Concern  . Not on file   Social History Narrative  . No narrative on file    Family History  Problem Relation Age of Onset  . Heart disease Other     Past Medical History  Diagnosis Date  . Hypertension     Unspecified  . Hyperlipidemia     Mixed  . Coronary artery disease 08/21/09    Catheterization 2006 occluded OM vein graft  /   nuclear 2008 question slight ischemia  . S/P CABG (coronary artery bypass graft) 2003     2003  LIMA, LAD, SVG to the OM, SVG right coronary artery  . Aneurysm 2003    Ascending thoracic aneurysm repair with a graft at time of CABG  . Diabetes mellitus     Insulin dependent  . Gastroparesis   . Depression   . Allergy history, radiographic dye     Contrast dye allergy  . Ejection fraction     EF 50%  . Previous back  surgery   . Syncope 2008    Question?  2008  . Statin intolerance     Past Surgical History  Procedure Date  . Coronary artery bypass graft 2003     LIMA, LAD, SVG to the OM, SVG right coronary artery  . Bladder tacking   . Breast surgery   . Salivary gland surgery   . Total abdominal hysterectomy   . Back surgery     ROS  Patient denies fever, chills, headache, sweats, rash, change in vision, change in hearing, chest pain, cough, nausea vomiting, urinary symptoms all other systems are reviewed and are negative.  PHYSICAL EXAM Patient is here with her nephew.  He is trying to help her with her diet and is following her blood pressure.  She is oriented to person time and place.  Affect is normal.  Head is atraumatic.  Lungs are clear.  Respiratory effort is unlabored.  Cardiac exam reveals S1 and S2.  No clicks or significant murmurs.  The abdomen is soft.  There is trace peripheral edema. Filed Vitals:   07/19/10 1405  BP: 118/69  Pulse: 78  Resp: 18  Height: 5\' 4"  (1.626 m)  Weight: 210 lb 12.8 oz (95.618 kg)  SpO2: 94%    EKG Is not done today.  ASSESSMENT & PLAN

## 2010-07-19 NOTE — Assessment & Plan Note (Signed)
Blood pressure is under reasonable control. No change in therapy. 

## 2010-07-19 NOTE — Assessment & Plan Note (Signed)
Coronary disease is stable. No further workup needed. 

## 2010-07-25 ENCOUNTER — Emergency Department (HOSPITAL_COMMUNITY): Payer: Medicare Other

## 2010-07-25 ENCOUNTER — Emergency Department (HOSPITAL_COMMUNITY)
Admission: EM | Admit: 2010-07-25 | Discharge: 2010-07-25 | Disposition: A | Discharge status: Home / Self Care | Payer: Medicare Other | Attending: Emergency Medicine | Admitting: Emergency Medicine

## 2010-07-25 DIAGNOSIS — Z951 Presence of aortocoronary bypass graft: Secondary | ICD-10-CM | POA: Insufficient documentation

## 2010-07-25 DIAGNOSIS — E785 Hyperlipidemia, unspecified: Secondary | ICD-10-CM | POA: Insufficient documentation

## 2010-07-25 DIAGNOSIS — E119 Type 2 diabetes mellitus without complications: Secondary | ICD-10-CM | POA: Insufficient documentation

## 2010-07-25 DIAGNOSIS — Z79899 Other long term (current) drug therapy: Secondary | ICD-10-CM | POA: Insufficient documentation

## 2010-07-25 DIAGNOSIS — I10 Essential (primary) hypertension: Secondary | ICD-10-CM | POA: Insufficient documentation

## 2010-07-25 DIAGNOSIS — M25569 Pain in unspecified knee: Secondary | ICD-10-CM | POA: Insufficient documentation

## 2010-07-25 DIAGNOSIS — I509 Heart failure, unspecified: Secondary | ICD-10-CM | POA: Insufficient documentation

## 2010-07-25 DIAGNOSIS — Z794 Long term (current) use of insulin: Secondary | ICD-10-CM | POA: Insufficient documentation

## 2010-07-25 DIAGNOSIS — F3289 Other specified depressive episodes: Secondary | ICD-10-CM | POA: Insufficient documentation

## 2010-07-25 DIAGNOSIS — F329 Major depressive disorder, single episode, unspecified: Secondary | ICD-10-CM | POA: Insufficient documentation

## 2010-07-25 DIAGNOSIS — E039 Hypothyroidism, unspecified: Secondary | ICD-10-CM | POA: Insufficient documentation

## 2010-10-22 ENCOUNTER — Other Ambulatory Visit: Payer: Self-pay | Admitting: Cardiovascular Disease

## 2010-10-22 MED ORDER — ISOSORBIDE MONONITRATE ER 30 MG PO TB24: 60.0000 mg | ORAL_TABLET | Freq: Every day | ORAL | Status: DC

## 2011-01-29 ENCOUNTER — Ambulatory Visit (INDEPENDENT_AMBULATORY_CARE_PROVIDER_SITE_OTHER): Payer: Medicare Other | Admitting: Cardiology

## 2011-01-29 ENCOUNTER — Encounter: Payer: Self-pay | Admitting: Cardiology

## 2011-01-29 VITALS — BP 117/74 | HR 85 | Ht 65.0 in | Wt 206.0 lb

## 2011-01-29 DIAGNOSIS — I2510 Atherosclerotic heart disease of native coronary artery without angina pectoris: Secondary | ICD-10-CM

## 2011-01-29 NOTE — Patient Instructions (Signed)
Your physician wants you to follow-up in: 6 months. You will receive a reminder letter in the mail one-two months in advance. If you don't receive a letter, please call our office to schedule the follow-up appointment. Your physician recommends that you continue on your current medications as directed. Please refer to the Current Medication list given to you today. 

## 2011-01-29 NOTE — Assessment & Plan Note (Signed)
The patient has known coronary disease. There was question of recent chest pain. The patient today tells me she is having absolutely no symptoms. She prefers not to have any type of exercise testing. I feel she is stable and that we can follow her.

## 2011-01-29 NOTE — Assessment & Plan Note (Signed)
Blood pressure is controlled. No change in therapy. 

## 2011-01-29 NOTE — Progress Notes (Signed)
HPI Patient is seen for followup of chest pain and coronary disease. She's also seen to followup her recent hospitalization. The patient was hospitalized December, 2012. There was question of some chest pain. There was no evidence of an acute coronary syndrome. Consideration was given to following up with an outpatient nuclear scan. The patient's insurance refused to allow this. She is now here for followup. Allergies  Allergen Reactions  . Atorvastatin   . Citrus Dermatitis    BREAK OUT ON BODY  . Iodine   . Iohexol      Desc: ANAPHYLAXIS   . Zolpidem Tartrate     REACTION: hallucinations    Current Outpatient Prescriptions  Medication Sig Dispense Refill  . acetaminophen (TYLENOL) 650 MG CR tablet Take 650 mg by mouth every 8 (eight) hours as needed.        [name] amLODipine (NORVASC) 5 MG tablet Take 5 mg by mouth daily.        [name] aspirin 325 MG tablet Take 325 mg by mouth daily.        . furosemide (LASIX) 20 MG tablet Take 20 mg by mouth daily.      [name] gabapentin (NEURONTIN) 300 MG capsule Take 300 mg by mouth at bedtime.        . hydrOXYzine (ATARAX) 25 MG tablet Take 25 mg by mouth 2 (two) times daily.       . insulin glargine (LANTUS) 100 UNIT/ML injection Inject 90 Units into the skin 2 (two) times daily.        . insulin lispro (HUMALOG) 100 UNIT/ML injection Inject into the skin daily.        . isosorbide mononitrate (IMDUR) 30 MG 24 hr tablet Take 2 tablets (60 mg total) by mouth daily.  30 tablet  5  . levothyroxine (SYNTHROID, LEVOTHROID) 112 MCG tablet Take 56 mcg by mouth daily.        . meclizine (ANTIVERT) 25 MG tablet Take 25 mg by mouth as needed.        . metFORMIN (GLUMETZA) 500 MG (MOD) 24 hr tablet Take 500 mg by mouth 2 (two) times daily.        . metoprolol (LOPRESSOR) 50 MG tablet Take 50 mg by mouth 2 (two) times daily.      . minocycline (MINOCIN,DYNACIN) 50 MG capsule Take 50 mg by mouth 2 (two) times daily.        . nitroGLYCERIN (NITROSTAT) 0.4 MG SL tablet  Place 0.4 mg under the tongue every 5 (five) minutes as needed. May repeat times three.       . ramipril (ALTACE) 5 MG capsule Take 5 mg by mouth daily.          History   Social History  . Marital Status: Divorced    Spouse Name: N/A    Number of Children: N/A  . Years of Education: N/A   Occupational History  . Retired    Social History Main Topics  . Smoking status: Never Smoker   . Smokeless tobacco: Never Used  . Alcohol Use: No  . Drug Use: Not on file  . Sexually Active: Not on file   Other Topics Concern  . Not on file   Social History Narrative  . No narrative on file    Family History  Problem Relation Age of Onset  . Heart disease Other     Past Medical History  Diagnosis Date  . Hypertension     Unspecified  . Hyperlipidemia  Mixed  . Coronary artery disease 08/21/09    Catheterization 2006 occluded OM vein graft  /   nuclear 2008 question slight ischemia  . S/P CABG (coronary artery bypass graft) 2003     2003  LIMA, LAD, SVG to the OM, SVG right coronary artery  . Aneurysm 2003    Ascending thoracic aneurysm repair with a graft at time of CABG  . Diabetes mellitus     Insulin dependent  . Gastroparesis   . Depression   . Allergy history, radiographic dye     Contrast dye allergy  . Ejection fraction     EF 50%  . Previous back surgery   . Syncope 2008    Question?  2008  . Statin intolerance     Past Surgical History  Procedure Date  . Coronary artery bypass graft 2003     LIMA, LAD, SVG to the OM, SVG right coronary artery  . Bladder tacking   . Breast surgery   . Salivary gland surgery   . Total abdominal hysterectomy   . Back surgery     ROS   Patient denies fever, chills, headache, sweats, rash, change in vision, change in hearing, chest pain, cough, nausea vomiting, urinary symptoms. All of the systems are reviewed and are negative.  PHYSICAL EXAM  Patient is stable today. She walks with a cane. She is oriented to person  time and place. Affect is normal. There is no jugulovenous distention. Lungs are clear. Respiratory effort is nonlabored. Cardiac exam reveals S1 and S2. There no clicks or significant murmurs. The abdomen is soft. There is no peripheral edema.  Filed Vitals:   01/29/11 1436  BP: 117/74  Pulse: 85  Height: 5\' 5"  (1.651 m)  Weight: 206 lb (93.441 kg)  SpO2: 96%     ASSESSMENT & PLAN

## 2011-05-28 ENCOUNTER — Encounter: Payer: Self-pay | Admitting: Cardiology

## 2011-05-28 ENCOUNTER — Ambulatory Visit (INDEPENDENT_AMBULATORY_CARE_PROVIDER_SITE_OTHER): Payer: Medicare Other | Admitting: Cardiology

## 2011-05-28 ENCOUNTER — Encounter: Payer: Self-pay | Admitting: *Deleted

## 2011-05-28 ENCOUNTER — Other Ambulatory Visit: Payer: Self-pay | Admitting: Cardiology

## 2011-05-28 VITALS — BP 116/68 | HR 81 | Ht 65.0 in | Wt 204.0 lb

## 2011-05-28 DIAGNOSIS — I2510 Atherosclerotic heart disease of native coronary artery without angina pectoris: Secondary | ICD-10-CM

## 2011-05-28 DIAGNOSIS — R0602 Shortness of breath: Secondary | ICD-10-CM

## 2011-05-28 DIAGNOSIS — R55 Syncope and collapse: Secondary | ICD-10-CM

## 2011-05-28 NOTE — Progress Notes (Signed)
HPI  Patient is seen for followup coronary disease. She underwent CABG in 2003. Catheterization in 2006 revealed an occluded OM graft. Nuclear scan in 2008 raised question of slight ischemia. She was hospitalized in December, 2012. There was no acute MI. There was discussion of proceeding with a nuclear scan but it is my understanding that her insurance would not allow this. She now states that she has marked exertional shortness of breath. She's not having any significant chest pain. It appears that she was admitted to the hospital in April, 2013. There is mention of edema and diuresis. She does not have any edema at this time.  As part of today's evaluation I have tried to review her recent hospital records. I have found a discharge summary. I do not believe the patient was seen in consultation by our team. Also I do not find an echo or nuclear study. Allergies  Allergen Reactions  . Atorvastatin   . Citrus Dermatitis    BREAK OUT ON BODY  . Iodine   . Iohexol      Desc: ANAPHYLAXIS   . Zolpidem Tartrate     REACTION: hallucinations    Current Outpatient Prescriptions  Medication Sig Dispense Refill  . acetaminophen (TYLENOL) 650 MG CR tablet Take 650 mg by mouth every 8 (eight) hours as needed.        [name] amLODipine (NORVASC) 5 MG tablet Take 5 mg by mouth daily.        [name] aspirin 325 MG tablet Take 325 mg by mouth daily.        . furosemide (LASIX) 20 MG tablet Take 20 mg by mouth daily.      [name] gabapentin (NEURONTIN) 300 MG capsule Take 300 mg by mouth at bedtime.        . hydrOXYzine (ATARAX) 25 MG tablet Take 25 mg by mouth 2 (two) times daily.       . insulin glargine (LANTUS) 100 UNIT/ML injection Inject 90 Units into the skin 2 (two) times daily.        . insulin lispro (HUMALOG) 100 UNIT/ML injection Inject into the skin daily.        . isosorbide mononitrate (IMDUR) 30 MG 24 hr tablet Take 2 tablets (60 mg total) by mouth daily.  30 tablet  5  . levothyroxine (SYNTHROID,  LEVOTHROID) 112 MCG tablet Take 56 mcg by mouth daily.        . meclizine (ANTIVERT) 25 MG tablet Take 25 mg by mouth as needed.        . metFORMIN (GLUMETZA) 500 MG (MOD) 24 hr tablet Take 500 mg by mouth 2 (two) times daily.        . metoprolol (LOPRESSOR) 50 MG tablet Take 50 mg by mouth 2 (two) times daily.      . minocycline (MINOCIN,DYNACIN) 50 MG capsule Take 50 mg by mouth 2 (two) times daily.        . nitroGLYCERIN (NITROSTAT) 0.4 MG SL tablet Place 0.4 mg under the tongue every 5 (five) minutes as needed. May repeat times three.       . ramipril (ALTACE) 5 MG capsule Take 5 mg by mouth daily.          History   Social History  . Marital Status: Divorced    Spouse Name: N/A    Number of Children: N/A  . Years of Education: N/A   Occupational History  . Retired    Social History Main Topics  . Smoking  status: Never Smoker   . Smokeless tobacco: Never Used  . Alcohol Use: No  . Drug Use: Not on file  . Sexually Active: Not on file   Other Topics Concern  . Not on file   Social History Narrative  . No narrative on file    Family History  Problem Relation Age of Onset  . Heart disease Other     Past Medical History  Diagnosis Date  . Hypertension     Unspecified  . Hyperlipidemia     Mixed  . Coronary artery disease 08/21/09    Catheterization 2006 occluded OM vein graft  /   nuclear 2008 question slight ischemia  . S/P CABG (coronary artery bypass graft) 2003     2003  LIMA, LAD, SVG to the OM, SVG right coronary artery  . Aneurysm 2003    Ascending thoracic aneurysm repair with a graft at time of CABG  . Diabetes mellitus     Insulin dependent  . Gastroparesis   . Depression   . Allergy history, radiographic dye     Contrast dye allergy  . Ejection fraction     EF 50%  . Previous back surgery   . Syncope 2008    Question?  2008  . Statin intolerance     Past Surgical History  Procedure Date  . Coronary artery bypass graft 2003     LIMA, LAD,  SVG to the OM, SVG right coronary artery  . Bladder tacking   . Breast surgery   . Salivary gland surgery   . Total abdominal hysterectomy   . Back surgery     ROS   Patient denies fever, chills, headache, sweats. She says that she has is diffuse rash. She says she was told in the past that it might be related to her nerves. She has not had any change in her vision or hearing. There is been no cough nausea vomiting or urinary symptoms. All other systems are reviewed and are negative.  PHYSICAL EXAM   Patient is oriented to person time and place. She appears somewhat depressed over her health care status. There is no jugulovenous distention. Lungs reveal a few scattered rhonchi. Cardiac exam reveals S1 and S2. There are no clicks or significant murmurs. Abdomen is soft. There is no significant peripheral edema. There are no skin rashes present today. There is no significant musculoskeletal deformities.  Filed Vitals:   05/28/11 1402  BP: 116/68  Pulse: 81  Height: 5\' 5"  (1.651 m)  Weight: 204 lb (92.534 kg)  SpO2: 94%     ASSESSMENT & PLAN

## 2011-05-28 NOTE — Assessment & Plan Note (Signed)
Dobutamine stress echo be done to see if we can assess further the possibility of ischemia causing her marked exertional shortness of breath.

## 2011-05-28 NOTE — Assessment & Plan Note (Signed)
There has been no recurrent syncope.

## 2011-05-28 NOTE — Patient Instructions (Addendum)
   Dobutamine Stress Echo If the results of your test are normal or stable, you will receive a letter.  If they are abnormal, the nurse will contact you by phone. Follow up as scheduled

## 2011-05-28 NOTE — Assessment & Plan Note (Signed)
The patient's exertional shortness of breath appears to be her major problem. At this point there is no definite proof that it represents ischemia. However there was a questionable nuclear scan in 2008. She is very limited by this exertional shortness of breath. She does not appear to be significantly volume overloaded at this time. When we tried to proceed with a nuclear scan in the past her insurance company would not allow it. We will try a dobutamine stress echo to see if we can see her LV function at rest and with stress. I will then see her back for followup.

## 2011-06-19 DIAGNOSIS — R079 Chest pain, unspecified: Secondary | ICD-10-CM

## 2011-06-20 ENCOUNTER — Encounter: Payer: Self-pay | Admitting: Cardiology

## 2011-06-21 DIAGNOSIS — I255 Ischemic cardiomyopathy: Secondary | ICD-10-CM

## 2011-06-21 HISTORY — DX: Ischemic cardiomyopathy: I25.5

## 2011-06-25 ENCOUNTER — Encounter: Payer: Self-pay | Admitting: Cardiology

## 2011-06-25 DIAGNOSIS — R9430 Abnormal result of cardiovascular function study, unspecified: Secondary | ICD-10-CM | POA: Insufficient documentation

## 2011-06-27 ENCOUNTER — Telehealth: Payer: Self-pay | Admitting: *Deleted

## 2011-06-27 NOTE — Telephone Encounter (Signed)
Message copied by Eustace Moore on Fri Jun 27, 2011  8:54 AM ------      Message from: Myrtis Ser, [place] D      Created: Wed Jun 25, 2011  5:11 PM       He would be better to try to get the patient in for early her followup. If there is any way to squeeze her in in the next few weeks we should do this. Please call to see how she's feeling and let me know

## 2011-06-27 NOTE — Telephone Encounter (Signed)
Patient states she doesn't have very much chest pain but she feels weak and tired all the time with some dizziness.   Patient scheduled for appointment on June 19th at 2:00 pm.

## 2011-06-27 NOTE — Telephone Encounter (Signed)
Left message for patient to call office.  

## 2011-07-09 ENCOUNTER — Other Ambulatory Visit: Payer: Self-pay | Admitting: *Deleted

## 2011-07-09 ENCOUNTER — Encounter: Payer: Self-pay | Admitting: Cardiology

## 2011-07-09 ENCOUNTER — Ambulatory Visit (INDEPENDENT_AMBULATORY_CARE_PROVIDER_SITE_OTHER): Payer: Medicare Other | Admitting: Cardiology

## 2011-07-09 ENCOUNTER — Ambulatory Visit: Payer: Medicare Other | Admitting: Cardiology

## 2011-07-09 VITALS — BP 113/67 | HR 73 | Ht 65.0 in | Wt 198.0 lb

## 2011-07-09 DIAGNOSIS — I10 Essential (primary) hypertension: Secondary | ICD-10-CM

## 2011-07-09 DIAGNOSIS — I429 Cardiomyopathy, unspecified: Secondary | ICD-10-CM

## 2011-07-09 DIAGNOSIS — I428 Other cardiomyopathies: Secondary | ICD-10-CM

## 2011-07-09 DIAGNOSIS — I2510 Atherosclerotic heart disease of native coronary artery without angina pectoris: Secondary | ICD-10-CM

## 2011-07-09 DIAGNOSIS — R0602 Shortness of breath: Secondary | ICD-10-CM

## 2011-07-09 MED ORDER — CARVEDILOL 12.5 MG PO TABS: 12.5000 mg | ORAL_TABLET | Freq: Two times a day (BID) | ORAL | Status: DC

## 2011-07-09 NOTE — Assessment & Plan Note (Signed)
Blood pressures controlled. No change in therapy for her blood pressure.

## 2011-07-09 NOTE — Assessment & Plan Note (Signed)
The patient had a stress echo. However her resting LV function was significantly abnormal. There was an impression that she said slight increase in wall motion with high dose dobutamine. We will have to make further decisions about the assessment of her coronary arteries.

## 2011-07-09 NOTE — Assessment & Plan Note (Signed)
The patient has ongoing significant exertional shortness of breath. This probably is from her left ventricular dysfunction. There may be a mild volume component to it. I will be pushing her diuretic dose and checking her renal function.

## 2011-07-09 NOTE — Patient Instructions (Addendum)
Your physician recommends that you schedule a follow-up appointment in: next Friday June 28th @3 :15 pm with Dr. Myrtis Ser.  Your physician has recommended you make the following change in your medication: Stop metoprolol Start Carvedilol 12.5 mg twice daily. Increase furosemide to 40 mg daily. Please take (2) of your 20 mg tablets to equal this dose. Your new prescription has been sent to your pharmacy.  Your physician recommends that you return for lab work in: preferably on Wednesday of next week. If transportation is an issue for that day, Please go on Friday just before your scheduled appointment with Myrtis Ser.

## 2011-07-09 NOTE — Progress Notes (Signed)
HPI Patient is seen today to followup coronary artery disease. I saw her last maybe 8, 2013. By history she underwent CABG in 2003. In 2006 she had an occluded OM graft at catheterization. Nuclear scan in 2008 raised the question of slight ischemia. She was hospitalized in December, 2012. We wanted to do a nuclear scan but her insurance would not allow this. When I saw her in the office on May 28, 2011 she had exertional shortness of breath. She was on Lasix 20 at that time. We decided to proceed with a followup 2-D echo. I had wanted to do an exercise test. Because she was not allowed to have a nuclear scan in the past I arranged for a stress echo. The patient's resting LV function was significantly abnormal. Ejection fraction was 25%. The patient was stressed with dobutamine area there was the impression that there might have been mild global improvement in LV function at peak dobutamine infusion. Overall however the stress test was nondiagnostic. The patient returns today and I have updated her on all of the data. She does have some mild vertigo. She has significant exertional shortness of breath.  Allergies  Allergen Reactions  . Atorvastatin   . Citrus Dermatitis    BREAK OUT ON BODY  . Iodine   . Iohexol      Desc: ANAPHYLAXIS   . Zolpidem Tartrate     REACTION: hallucinations    Current Outpatient Prescriptions  Medication Sig Dispense Refill  . acetaminophen (TYLENOL) 650 MG CR tablet Take 650 mg by mouth every 8 (eight) hours as needed.        [name] amLODipine (NORVASC) 5 MG tablet Take 5 mg by mouth daily.        [name] aspirin 325 MG tablet Take 325 mg by mouth daily.        . furosemide (LASIX) 20 MG tablet Take 20 mg by mouth daily.      [name] gabapentin (NEURONTIN) 300 MG capsule Take 300 mg by mouth at bedtime.        . hydrOXYzine (ATARAX) 25 MG tablet Take 25 mg by mouth 2 (two) times daily.       . insulin glargine (LANTUS) 100 UNIT/ML injection Inject 90 Units into the skin 2 (two)  times daily.        . insulin lispro (HUMALOG) 100 UNIT/ML injection Inject into the skin daily.        . isosorbide mononitrate (IMDUR) 30 MG 24 hr tablet Take 2 tablets (60 mg total) by mouth daily.  30 tablet  5  . levothyroxine (SYNTHROID, LEVOTHROID) 112 MCG tablet Take 56 mcg by mouth daily.        . meclizine (ANTIVERT) 25 MG tablet Take 25 mg by mouth as needed.        . metFORMIN (GLUMETZA) 500 MG (MOD) 24 hr tablet Take 500 mg by mouth 2 (two) times daily.        . metoprolol (LOPRESSOR) 50 MG tablet Take 50 mg by mouth 2 (two) times daily.      . minocycline (MINOCIN,DYNACIN) 50 MG capsule Take 50 mg by mouth 2 (two) times daily.        . nitroGLYCERIN (NITROSTAT) 0.4 MG SL tablet Place 0.4 mg under the tongue every 5 (five) minutes as needed. May repeat times three.       . ramipril (ALTACE) 5 MG capsule Take 5 mg by mouth daily.  History   Social History  . Marital Status: Divorced    Spouse Name: N/A    Number of Children: N/A  . Years of Education: N/A   Occupational History  . Retired    Social History Main Topics  . Smoking status: Never Smoker   . Smokeless tobacco: Never Used  . Alcohol Use: No  . Drug Use: Not on file  . Sexually Active: Not on file   Other Topics Concern  . Not on file   Social History Narrative  . No narrative on file    Family History  Problem Relation Age of Onset  . Heart disease Other     Past Medical History  Diagnosis Date  . Hypertension     Unspecified  . Hyperlipidemia     Mixed  . Coronary artery disease 08/21/09    Catheterization 2006 occluded OM vein graft  /   nuclear 2008 question slight ischemia  . S/P CABG (coronary artery bypass graft) 2003     2003  LIMA, LAD, SVG to the OM, SVG right coronary artery  . Aneurysm 2003    Ascending thoracic aneurysm repair with a graft at time of CABG  . Diabetes mellitus     Insulin dependent  . Gastroparesis   . Depression   . Allergy history, radiographic dye      Contrast dye allergy  . Ejection fraction     EF 50%  //  Ejection fraction 25-30% at the time of stress echo,  May, 2013  . Previous back surgery   . Syncope 2008    Question?  2008  . Statin intolerance   . Shortness of breath     Exertional shortness of breath, May, 2013    Past Surgical History  Procedure Date  . Coronary artery bypass graft 2003     LIMA, LAD, SVG to the OM, SVG right coronary artery  . Bladder tacking   . Breast surgery   . Salivary gland surgery   . Total abdominal hysterectomy   . Back surgery     ROS  Patient denies fever, chills, headache, sweats, rash, change in vision, change in hearing, chest pain, nausea vomiting, urinary symptoms. All other systems are reviewed and are negative.  PHYSICAL EXAM  Patient is in a wheelchair today. She's here with a family member. There is no jugulovenous distention. Lungs reveal a few scattered rhonchi. There are no definite rales. Cardiac exam reveals S1 and S2. There are no clicks or significant murmurs. The abdomen is obese but soft. The patient does have trace to 1+ edema. There are no skin rashes.  There were no vitals filed for this visit.   ASSESSMENT & PLAN

## 2011-07-09 NOTE — Assessment & Plan Note (Signed)
The patient now has worsened left ventricular function. I will change her metoprolol to carvedilol. I will then see if I can titrate her meds any further. We will then have make decisions about whether repeat catheterization is appropriate. She she is 78 years of age. She is fully alert.

## 2011-07-16 ENCOUNTER — Encounter: Payer: Self-pay | Admitting: Cardiology

## 2011-07-18 ENCOUNTER — Ambulatory Visit (INDEPENDENT_AMBULATORY_CARE_PROVIDER_SITE_OTHER): Payer: Medicare Other | Admitting: Cardiology

## 2011-07-18 ENCOUNTER — Encounter: Payer: Self-pay | Admitting: Cardiology

## 2011-07-18 VITALS — BP 111/66 | HR 84 | Ht 65.0 in | Wt 198.0 lb

## 2011-07-18 DIAGNOSIS — R0602 Shortness of breath: Secondary | ICD-10-CM

## 2011-07-18 DIAGNOSIS — I429 Cardiomyopathy, unspecified: Secondary | ICD-10-CM

## 2011-07-18 DIAGNOSIS — I428 Other cardiomyopathies: Secondary | ICD-10-CM

## 2011-07-18 DIAGNOSIS — I10 Essential (primary) hypertension: Secondary | ICD-10-CM

## 2011-07-18 MED ORDER — RAMIPRIL 10 MG PO CAPS: 10.0000 mg | ORAL_CAPSULE | Freq: Every day | ORAL | Status: DC

## 2011-07-18 MED ORDER — FUROSEMIDE 20 MG PO TABS: 40.0000 mg | ORAL_TABLET | Freq: Two times a day (BID) | ORAL | Status: DC

## 2011-07-18 NOTE — Patient Instructions (Addendum)
Your physician recommends that you schedule a follow-up appointment in: July 9th 2013 @11 :00am with Dr. Myrtis Ser.  Your physician has recommended you make the following change in your medication: increase furosemide to 40 mg twice daily. Increase ramipril to 10 mg daily. You may take (2) of your 5 mg capsules until they are finished. Your new prescriptions have been sent to your pharmacy. DO NOT TAKE AMLODIPINE. THIS HAS BEEN PLACED ON HOLD BY YOUR DOCTOR.

## 2011-07-18 NOTE — Assessment & Plan Note (Signed)
Patient continues to have severe exertional shortness of breath. I am hopeful that she will feel better with further diuresis.

## 2011-07-18 NOTE — Progress Notes (Signed)
HPI Patient is seen for cardiology followup. She has newly diagnosed worsened LV function. I saw her last on July 09, 2011. On that day I changed from metoprolol to carvedilol. My intent had been to increase her Lasix from 20-40. Today she tells me she's actually been on 40. On that dose her renal function was stable and her potassium was stable. Creatinine was 1.0. She returns today and still has exertional shortness of breath. In addition she has some continued vertigo. She is not having orthostatic symptoms.  Allergies  Allergen Reactions  . Atorvastatin   . Citrus Dermatitis    BREAK OUT ON BODY  . Iodine   . Iohexol      Desc: ANAPHYLAXIS   . Zolpidem Tartrate     REACTION: hallucinations  . Milk-Related Compounds Rash    Current Outpatient Prescriptions  Medication Sig Dispense Refill  . acetaminophen (TYLENOL) 650 MG CR tablet Take 650 mg by mouth every 8 (eight) hours as needed.        [name] amLODipine (NORVASC) 5 MG tablet Take 5 mg by mouth daily.        [name] aspirin 325 MG tablet Take 325 mg by mouth daily.        . carvedilol (COREG) 12.5 MG tablet Take 1 tablet (12.5 mg total) by mouth 2 (two) times daily with a meal.  60 tablet  3  . furosemide (LASIX) 20 MG tablet Take 40 mg by mouth daily. REPEAT BMET ON Wednesday 26TH      . gabapentin (NEURONTIN) 300 MG capsule Take 300 mg by mouth at bedtime.        . hydrocortisone 2.5 % cream Apply 1 application topically as needed.       . hydrOXYzine (ATARAX) 25 MG tablet Take 25 mg by mouth 2 (two) times daily.       . insulin glargine (LANTUS) 100 UNIT/ML injection Inject 90 Units into the skin 2 (two) times daily.        . insulin lispro (HUMALOG) 100 UNIT/ML injection Inject into the skin daily.        . isosorbide mononitrate (IMDUR) 30 MG 24 hr tablet Take 2 tablets (60 mg total) by mouth daily.  30 tablet  5  . levothyroxine (SYNTHROID, LEVOTHROID) 112 MCG tablet Take 56 mcg by mouth daily.        . meclizine (ANTIVERT) 25 MG  tablet Take 25 mg by mouth as needed.        . metFORMIN (GLUMETZA) 500 MG (MOD) 24 hr tablet Take 500 mg by mouth 2 (two) times daily.        . minocycline (MINOCIN,DYNACIN) 50 MG capsule Take 50 mg by mouth 2 (two) times daily.        . nitroGLYCERIN (NITROSTAT) 0.4 MG SL tablet Place 0.4 mg under the tongue every 5 (five) minutes as needed. May repeat times three.       . promethazine (PHENERGAN) 25 MG tablet Take 25 mg by mouth every 8 (eight) hours as needed.       . ramipril (ALTACE) 5 MG capsule Take 5 mg by mouth daily.          History   Social History  . Marital Status: Divorced    Spouse Name: N/A    Number of Children: N/A  . Years of Education: N/A   Occupational History  . Retired    Social History Main Topics  . Smoking status: Never Smoker   .  Smokeless tobacco: Never Used  . Alcohol Use: No  . Drug Use: Not on file  . Sexually Active: Not on file   Other Topics Concern  . Not on file   Social History Narrative  . No narrative on file    Family History  Problem Relation Age of Onset  . Heart disease Other     Past Medical History  Diagnosis Date  . Hypertension     Unspecified  . Hyperlipidemia     Mixed  . Coronary artery disease 08/21/09    Catheterization 2006 occluded OM vein graft  /   nuclear 2008 question slight ischemia  . S/P CABG (coronary artery bypass graft) 2003     2003  LIMA, LAD, SVG to the OM, SVG right coronary artery  . Aneurysm 2003    Ascending thoracic aneurysm repair with a graft at time of CABG  . Diabetes mellitus     Insulin dependent  . Gastroparesis   . Depression   . Allergy history, radiographic dye     Contrast dye allergy  . Ejection fraction     EF 50%  //  Ejection fraction 25-30% at the time of stress echo,  May, 2013  . Previous back surgery   . Syncope 2008    Question?  2008  . Statin intolerance   . Shortness of breath     Exertional shortness of breath, May, 2013  . Systolic CHF     June, 2013  .  Cardiomyopathy     June, 2013    Past Surgical History  Procedure Date  . Coronary artery bypass graft 2003     LIMA, LAD, SVG to the OM, SVG right coronary artery  . Bladder tacking   . Breast surgery   . Salivary gland surgery   . Total abdominal hysterectomy   . Back surgery     ROS  The patient denies fever, chills, headache, sweats, rash, change in vision, change in hearing, chest pain, cough, nausea vomiting, urinary symptoms. All other systems are reviewed and are negative.  PHYSICAL EXAM Patient is stable. She's here with a family member. She is in a wheelchair. There is no jugulovenous distention. There are a few basilar rales. There is no respiratory distress. Cardiac exam reveals S1 and S2. There no clicks or significant murmurs. The abdomen is soft. There is 1+ peripheral edema.  Filed Vitals:   07/18/11 1549  BP: 111/66  Pulse: 84  Height: 5\' 5"  (1.651 m)  Weight: 198 lb (89.812 kg)    ASSESSMENT & PLAN

## 2011-07-18 NOTE — Assessment & Plan Note (Signed)
I am trying to continue to adjust her meds optimal he for her cardiomyopathy. Today her Norvasc will be put on hold) Monopril dose will be increased from 5-10. Also Lasix will be increased from 40-40 twice a day. I will see her for early followup.

## 2011-07-18 NOTE — Assessment & Plan Note (Signed)
Blood pressure is controlled. No change in therapy. 

## 2011-07-22 ENCOUNTER — Telehealth: Payer: Self-pay | Admitting: *Deleted

## 2011-07-22 NOTE — Telephone Encounter (Signed)
Patient informed. 

## 2011-07-22 NOTE — Telephone Encounter (Signed)
Message copied by Eustace Moore on Tue Jul 22, 2011  2:56 PM ------      Message from: Myrtis Ser, [place] D      Created: Tue Jul 22, 2011  2:40 PM       Chemistry test looks good. No change in therapy

## 2011-07-29 ENCOUNTER — Encounter: Payer: Self-pay | Admitting: Cardiology

## 2011-07-29 ENCOUNTER — Ambulatory Visit (INDEPENDENT_AMBULATORY_CARE_PROVIDER_SITE_OTHER): Payer: Medicare Other | Admitting: Cardiology

## 2011-07-29 VITALS — BP 118/79 | HR 87 | Ht 65.0 in | Wt 196.0 lb

## 2011-07-29 DIAGNOSIS — R0602 Shortness of breath: Secondary | ICD-10-CM

## 2011-07-29 DIAGNOSIS — I429 Cardiomyopathy, unspecified: Secondary | ICD-10-CM

## 2011-07-29 DIAGNOSIS — I428 Other cardiomyopathies: Secondary | ICD-10-CM

## 2011-07-29 DIAGNOSIS — I10 Essential (primary) hypertension: Secondary | ICD-10-CM

## 2011-07-29 MED ORDER — FUROSEMIDE 40 MG PO TABS: ORAL_TABLET | ORAL | Status: DC

## 2011-07-29 NOTE — Patient Instructions (Addendum)
Your physician recommends that you schedule a follow-up appointment on: July 18th 2013 with Dr. Myrtis Ser. Your physician has recommended you make the following change in your medication: increase furosemide to 80 mg in the morning and 40 mg in the evening. You have a new prescription at your pharmacy. All other medications are still the same. Your physician recommends that you return for lab work in: July 15th 2013 at [facility]. Please limit your extra water intake in the morning

## 2011-07-29 NOTE — Assessment & Plan Note (Signed)
Her shortness of breath continues or is related to severe LV dysfunction. She is drinking extra water because her mouth is dry. I tried to get her to try to cut this back. She is still clinically volume overloaded. Lasix will be increased to 80 in the morning and 40 later that day and I've asked her to try to cut back her extra fluid. I will then see her back for followup.

## 2011-07-29 NOTE — Assessment & Plan Note (Signed)
Other than her diuretics I will not change her other medicines any further as of today.

## 2011-07-29 NOTE — Assessment & Plan Note (Signed)
Blood pressure is controlled on her current medications.

## 2011-07-29 NOTE — Progress Notes (Signed)
HPI  Patient returns for followup of CHF. I saw her July 18, 2011. I did increase her Lasix on that day to 40 twice a day. Her renal function has been stable. She has diaries 2 pounds. She definitely drinks excess water during the day. She still has marked shortness of breath. Her Norvasc has been stopped and her ACE inhibitor increased.  Allergies  Allergen Reactions  . Atorvastatin   . Citrus Dermatitis    BREAK OUT ON BODY  . Iodine   . Iohexol      Desc: ANAPHYLAXIS   . Zolpidem Tartrate     REACTION: hallucinations  . Milk-Related Compounds Rash    Current Outpatient Prescriptions  Medication Sig Dispense Refill  . acetaminophen (TYLENOL) 650 MG CR tablet Take 650 mg by mouth every 8 (eight) hours as needed.        [name] aspirin 325 MG tablet Take 325 mg by mouth daily.        . carvedilol (COREG) 12.5 MG tablet Take 1 tablet (12.5 mg total) by mouth 2 (two) times daily with a meal.  60 tablet  3  . furosemide (LASIX) 20 MG tablet Take 2 tablets (40 mg total) by mouth 2 (two) times daily.  120 tablet  1  . gabapentin (NEURONTIN) 300 MG capsule Take 300 mg by mouth at bedtime.        . hydrocortisone 2.5 % cream Apply 1 application topically as needed.       . hydrOXYzine (ATARAX) 25 MG tablet Take 25 mg by mouth 2 (two) times daily.       . insulin glargine (LANTUS) 100 UNIT/ML injection Inject 90 Units into the skin 2 (two) times daily.        . insulin lispro (HUMALOG) 100 UNIT/ML injection Inject into the skin daily.        . isosorbide mononitrate (IMDUR) 30 MG 24 hr tablet Take 2 tablets (60 mg total) by mouth daily.  30 tablet  5  . levothyroxine (SYNTHROID, LEVOTHROID) 112 MCG tablet Take 56 mcg by mouth daily.        . meclizine (ANTIVERT) 25 MG tablet Take 25 mg by mouth as needed.        . Meclizine HCl (BONINE PO) Take 1 tablet by mouth as needed. Takes either Bonine or Meclizine.      . metFORMIN (GLUMETZA) 500 MG (MOD) 24 hr tablet Take 500 mg by mouth 2 (two) times  daily.        . promethazine (PHENERGAN) 25 MG tablet Take 25 mg by mouth every 8 (eight) hours as needed.       . ramipril (ALTACE) 10 MG capsule Take 1 capsule (10 mg total) by mouth daily.  30 capsule  1  . amLODipine (NORVASC) 5 MG tablet Take 5 mg by mouth daily. ON HOLD      . nitroGLYCERIN (NITROSTAT) 0.4 MG SL tablet Place 0.4 mg under the tongue every 5 (five) minutes as needed. May repeat times three.         History   Social History  . Marital Status: Divorced    Spouse Name: N/A    Number of Children: N/A  . Years of Education: N/A   Occupational History  . Retired    Social History Main Topics  . Smoking status: Never Smoker   . Smokeless tobacco: Never Used  . Alcohol Use: No  . Drug Use: Not on file  . Sexually Active: Not  on file   Other Topics Concern  . Not on file   Social History Narrative  . No narrative on file    Family History  Problem Relation Age of Onset  . Heart disease Other     Past Medical History  Diagnosis Date  . Hypertension     Unspecified  . Hyperlipidemia     Mixed  . Coronary artery disease 08/21/09    Catheterization 2006 occluded OM vein graft  /   nuclear 2008 question slight ischemia  . S/P CABG (coronary artery bypass graft) 2003     2003  LIMA, LAD, SVG to the OM, SVG right coronary artery  . Aneurysm 2003    Ascending thoracic aneurysm repair with a graft at time of CABG  . Diabetes mellitus     Insulin dependent  . Gastroparesis   . Depression   . Allergy history, radiographic dye     Contrast dye allergy  . Ejection fraction     EF 50%  //  Ejection fraction 25-30% at the time of stress echo,  May, 2013  . Previous back surgery   . Syncope 2008    Question?  2008  . Statin intolerance   . Shortness of breath     Exertional shortness of breath, May, 2013  . Systolic CHF     June, 2013  . Cardiomyopathy     June, 2013    Past Surgical History  Procedure Date  . Coronary artery bypass graft 2003      LIMA, LAD, SVG to the OM, SVG right coronary artery  . Bladder tacking   . Breast surgery   . Salivary gland surgery   . Total abdominal hysterectomy   . Back surgery     ROS   Patient denies fever, chills, headache, sweats, rash, change in vision, change in hearing, chest pain, cough, nausea vomiting, urinary symptoms. All other systems are reviewed and are negative.  PHYSICAL EXAM   Patient is in a wheelchair. There is no jugulovenous distention. Lungs reveal basilar rales. Cardiac exam reveals S1 and S2. There no clicks or significant murmurs. The abdomen is protuberant. She has 1+ peripheral edema.   Filed Vitals:   07/29/11 1349  BP: 118/79  Pulse: 87  Height: 5\' 5"  (1.651 m)  Weight: 196 lb (88.905 kg)  SpO2: 98%     ASSESSMENT & PLAN

## 2011-08-05 LAB — LIPID PANEL: HDL: 31 mg/dL — AB (ref 35–70)

## 2011-08-06 ENCOUNTER — Telehealth: Payer: Self-pay | Admitting: *Deleted

## 2011-08-06 NOTE — Telephone Encounter (Signed)
Patient informed. 

## 2011-08-06 NOTE — Telephone Encounter (Signed)
Message copied by Eustace Moore on Wed Aug 06, 2011 10:57 AM ------      Message from: Myrtis Ser, [place] D      Created: Wed Aug 06, 2011  8:59 AM       Please let her noted the LAD looks good thank you      ----- Message -----         From: Geraldine Contras         Sent: 08/06/2011   8:19 AM           To: Luis Abed, MD

## 2011-08-07 ENCOUNTER — Encounter: Payer: Self-pay | Admitting: Cardiology

## 2011-08-07 ENCOUNTER — Ambulatory Visit (INDEPENDENT_AMBULATORY_CARE_PROVIDER_SITE_OTHER): Payer: Medicare Other | Admitting: Cardiology

## 2011-08-07 VITALS — BP 98/62 | HR 93 | Ht 65.0 in | Wt 196.0 lb

## 2011-08-07 DIAGNOSIS — I429 Cardiomyopathy, unspecified: Secondary | ICD-10-CM

## 2011-08-07 DIAGNOSIS — R0602 Shortness of breath: Secondary | ICD-10-CM

## 2011-08-07 DIAGNOSIS — I10 Essential (primary) hypertension: Secondary | ICD-10-CM

## 2011-08-07 DIAGNOSIS — I428 Other cardiomyopathies: Secondary | ICD-10-CM

## 2011-08-07 MED ORDER — CARVEDILOL 25 MG PO TABS: 25.0000 mg | ORAL_TABLET | Freq: Two times a day (BID) | ORAL | Status: DC

## 2011-08-07 NOTE — Progress Notes (Signed)
HPI Patient is seen back today to followup CHF. The last time I saw her I encouraged her to cut back her total fluid intake. I also increased her diuretic dose. Her weight today is the same although she has not taken her diuretic yet today. However she actually looks a little better. She's having continued difficulty with her left ear. She will be seeing Dr. Andrey Campanile soon. She's not having any chest pain.  Allergies  Allergen Reactions  . Atorvastatin   . Citrus Dermatitis    BREAK OUT ON BODY  . Iodine   . Iohexol      Desc: ANAPHYLAXIS   . Zolpidem Tartrate     REACTION: hallucinations  . Milk-Related Compounds Rash    Current Outpatient Prescriptions  Medication Sig Dispense Refill  . acetaminophen (TYLENOL) 650 MG CR tablet Take 650 mg by mouth every 8 (eight) hours as needed.        [name] amLODipine (NORVASC) 5 MG tablet Take 5 mg by mouth daily. ON HOLD      . aspirin 325 MG tablet Take 325 mg by mouth daily.        . carvedilol (COREG) 12.5 MG tablet Take 1 tablet (12.5 mg total) by mouth 2 (two) times daily with a meal.  60 tablet  3  . furosemide (LASIX) 40 MG tablet Take 2 by mouth in the morning and 1 by mouth in the evening  90 tablet  2  . gabapentin (NEURONTIN) 300 MG capsule Take 300 mg by mouth at bedtime.        . hydrocortisone 2.5 % cream Apply 1 application topically as needed.       . hydrOXYzine (ATARAX) 25 MG tablet Take 25 mg by mouth 2 (two) times daily.       . insulin glargine (LANTUS) 100 UNIT/ML injection Inject 90 Units into the skin 2 (two) times daily.        . insulin lispro (HUMALOG) 100 UNIT/ML injection Inject into the skin daily.        . isosorbide mononitrate (IMDUR) 30 MG 24 hr tablet Take 2 tablets (60 mg total) by mouth daily.  30 tablet  5  . levothyroxine (SYNTHROID, LEVOTHROID) 112 MCG tablet Take 56 mcg by mouth daily.        . meclizine (ANTIVERT) 25 MG tablet Take 25 mg by mouth as needed.        . Meclizine HCl (BONINE PO) Take 1 tablet  by mouth as needed. Takes either Bonine or Meclizine.      . metFORMIN (GLUMETZA) 500 MG (MOD) 24 hr tablet Take 500 mg by mouth 2 (two) times daily.        . nitroGLYCERIN (NITROSTAT) 0.4 MG SL tablet Place 0.4 mg under the tongue every 5 (five) minutes as needed. May repeat times three.       . promethazine (PHENERGAN) 25 MG tablet Take 25 mg by mouth every 8 (eight) hours as needed.       . ramipril (ALTACE) 10 MG capsule Take 1 capsule (10 mg total) by mouth daily.  30 capsule  1    History   Social History  . Marital Status: Divorced    Spouse Name: N/A    Number of Children: N/A  . Years of Education: N/A   Occupational History  . Retired    Social History Main Topics  . Smoking status: Never Smoker   . Smokeless tobacco: Never Used  . Alcohol Use:  No  . Drug Use: Not on file  . Sexually Active: Not on file   Other Topics Concern  . Not on file   Social History Narrative  . No narrative on file    Family History  Problem Relation Age of Onset  . Heart disease Other     Past Medical History  Diagnosis Date  . Hypertension     Unspecified  . Hyperlipidemia     Mixed  . Coronary artery disease 08/21/09    Catheterization 2006 occluded OM vein graft  /   nuclear 2008 question slight ischemia  . S/P CABG (coronary artery bypass graft) 2003     2003  LIMA, LAD, SVG to the OM, SVG right coronary artery  . Aneurysm 2003    Ascending thoracic aneurysm repair with a graft at time of CABG  . Diabetes mellitus     Insulin dependent  . Gastroparesis   . Depression   . Allergy history, radiographic dye     Contrast dye allergy  . Ejection fraction     EF 50%  //  Ejection fraction 25-30% at the time of stress echo,  May, 2013  . Previous back surgery   . Syncope 2008    Question?  2008  . Statin intolerance   . Shortness of breath     Exertional shortness of breath, May, 2013  . Systolic CHF     June, 2013  . Cardiomyopathy     June, 2013    Past Surgical  History  Procedure Date  . Coronary artery bypass graft 2003     LIMA, LAD, SVG to the OM, SVG right coronary artery  . Bladder tacking   . Breast surgery   . Salivary gland surgery   . Total abdominal hysterectomy   . Back surgery     ROS   Patient denies fever, chills, headache, sweats, rash, change in vision, change in hearing, chest pain, nausea vomiting, urinary symptoms. All other systems are reviewed and are negative.  PHYSICAL EXAM  Patient is in a wheelchair. She is oriented to person time and place. Affect is normal. There is no jugulovenous distention. Lungs reveal a few scattered rhonchi. There is no respiratory distress. Cardiac exam reveals S1 and S2. Her heart rate is increased. The abdomen is soft. There is trace peripheral edema. There no musculoskeletal deformities. She has mild venous stasis changes in her feet.  Filed Vitals:   08/07/11 1346  BP: 98/62  Pulse: 93  Height: 5\' 5"  (1.651 m)  Weight: 196 lb (88.905 kg)  SpO2: 96%     ASSESSMENT & PLAN

## 2011-08-07 NOTE — Patient Instructions (Addendum)
Your physician recommends that you schedule a follow-up appointment in: 5-6 weeks with Dr. Myrtis Ser. Your physician has recommended you make the following change in your medication: Increase carvedilol to 25 mg twice daily. You may take 2 of your 12. 5 mg twice daily until they are finished. Your new prescription has been sent to your pharmacy.

## 2011-08-07 NOTE — Assessment & Plan Note (Signed)
I will continue to treat with titration of her meds. Her resting heart rate remains at 93. Carvedilol dose will be increased to 25 mg twice a day.

## 2011-08-07 NOTE — Assessment & Plan Note (Signed)
Blood pressure is on the low side. She is on appropriate medications for her LV dysfunction. I do need to push her carvedilol little higher. I believe she'll be stable with this.

## 2011-08-07 NOTE — Assessment & Plan Note (Signed)
Her shortness of breath is slightly better. I am hopeful this will continue to improve as we treat her LV dysfunction.

## 2011-08-21 ENCOUNTER — Ambulatory Visit (INDEPENDENT_AMBULATORY_CARE_PROVIDER_SITE_OTHER): Payer: Medicare Other | Admitting: Family Medicine

## 2011-08-21 ENCOUNTER — Encounter: Payer: Self-pay | Admitting: Family Medicine

## 2011-08-21 VITALS — BP 118/60 | HR 85 | Resp 18 | Ht 65.0 in | Wt 195.0 lb

## 2011-08-21 DIAGNOSIS — I2510 Atherosclerotic heart disease of native coronary artery without angina pectoris: Secondary | ICD-10-CM

## 2011-08-21 DIAGNOSIS — E1149 Type 2 diabetes mellitus with other diabetic neurological complication: Secondary | ICD-10-CM

## 2011-08-21 DIAGNOSIS — M549 Dorsalgia, unspecified: Secondary | ICD-10-CM

## 2011-08-21 DIAGNOSIS — E1140 Type 2 diabetes mellitus with diabetic neuropathy, unspecified: Secondary | ICD-10-CM

## 2011-08-21 DIAGNOSIS — R42 Dizziness and giddiness: Secondary | ICD-10-CM

## 2011-08-21 DIAGNOSIS — E1142 Type 2 diabetes mellitus with diabetic polyneuropathy: Secondary | ICD-10-CM

## 2011-08-21 DIAGNOSIS — I10 Essential (primary) hypertension: Secondary | ICD-10-CM

## 2011-08-21 DIAGNOSIS — Z789 Other specified health status: Secondary | ICD-10-CM

## 2011-08-21 DIAGNOSIS — E119 Type 2 diabetes mellitus without complications: Secondary | ICD-10-CM

## 2011-08-21 MED ORDER — FLUTICASONE PROPIONATE 50 MCG/ACT NA SUSP: 1.0000 | Freq: Every day | NASAL | Status: DC

## 2011-08-21 NOTE — Assessment & Plan Note (Signed)
Noted intolerance to statin therapy

## 2011-08-21 NOTE — Patient Instructions (Signed)
I will get the records from Dr. Polly Cobia Continue your current medications Use the flonase for your nose and sinuses F/U 6 weeks

## 2011-08-21 NOTE — Progress Notes (Signed)
  Subjective:    Patient ID: Cathy [name], female    DOB: 1930/10/28, 78 y.o.   MRN: [id number]  HPI  Pt here to establish care, previous PCP Dr. Olena Leatherwood. Cards- Dr. Myrtis Ser, Dr. [name] Medications and history reviewed 1.DM- diagnosed in 1991, lantus 90 units BID, test CBG four times a day, sliding scale at meals, fasting CBG 139-300's, unknown A1C- had labs recently done, diabetic neuropathy 2. HTN- followed by cards, has low normotensive BP typically 3. CHF- has lost 11lbs with change in diurect on lasix 80mg  QAM, 40 units QPM 4. Vertigo- seen by ENT, recently started vestibular PT , chronic meclizine use 5. Chronic neck and back pain- evaluated by Dr. Channing Mutters in teh past, has DDD, ruptured disc Son Scarlett Presto is POA- has HH CNA 2-3 x a week , uses walker at baseline  Review of Systems   GEN- denies fatigue, fever, weight loss,weakness, recent illness HEENT- denies eye drainage, change in vision, nasal discharge, CVS- denies chest pain, palpitations RESP- + SOB, cough, wheeze ABD- denies N/V, change in stools, abd pain GU- denies dysuria, hematuria, dribbling, incontinence MSK- denies joint pain, muscle aches, injury Neuro- denies headache, dizziness, syncope, seizure activity      Objective:   Physical Exam GEN- NAD, alert and oriented x3, sitting in wheelchair HEENT- PERRL, EOMI, non injected sclera, pink conjunctiva, slightly dry tongue, oropharynx clear, Left TM- mesh like material noted at ear drum, no effusion seen, Right TM clear  Neck- Supple,  CVS- RRR, no murmur RESP-CTAB, audible breathing, increased WOB with exertion ABD-NABS,soft,NT,ND EXT- No edema, venous stasis changes Pulses- Radial, DP- 2+        Assessment & Plan:

## 2011-08-21 NOTE — Assessment & Plan Note (Signed)
Reviewed cardiology note, pt on aspirin, beta blocker, check recent labs by previous PCP

## 2011-08-21 NOTE — Assessment & Plan Note (Signed)
Based on CBG , diabetes appears uncontrolled, I will obtain PCP records, she is on very large amounts of insulin

## 2011-08-21 NOTE — Assessment & Plan Note (Signed)
At goal, no change to meds 

## 2011-09-04 ENCOUNTER — Telehealth: Payer: Self-pay | Admitting: Family Medicine

## 2011-09-05 NOTE — Telephone Encounter (Signed)
You can give samples of Lantus, see if she qualifies for [facility] for medication assistance

## 2011-09-05 NOTE — Telephone Encounter (Signed)
Pt wants to know if we can get her samples of her insulins because she is in the donut hole with medicare and the price of her insulin is high.

## 2011-09-09 ENCOUNTER — Encounter: Payer: Self-pay | Admitting: Family Medicine

## 2011-09-09 NOTE — Telephone Encounter (Signed)
Called and left message for pt to return call.  Pt does not qualify for [facility] due to insurance.

## 2011-09-10 ENCOUNTER — Encounter: Payer: Self-pay | Admitting: Cardiology

## 2011-09-10 ENCOUNTER — Ambulatory Visit (INDEPENDENT_AMBULATORY_CARE_PROVIDER_SITE_OTHER): Payer: Medicare Other | Admitting: Cardiology

## 2011-09-10 VITALS — BP 87/57 | HR 87 | Ht 65.0 in | Wt 196.0 lb

## 2011-09-10 DIAGNOSIS — I428 Other cardiomyopathies: Secondary | ICD-10-CM

## 2011-09-10 DIAGNOSIS — I429 Cardiomyopathy, unspecified: Secondary | ICD-10-CM

## 2011-09-10 DIAGNOSIS — I10 Essential (primary) hypertension: Secondary | ICD-10-CM

## 2011-09-10 MED ORDER — ISOSORBIDE MONONITRATE ER 30 MG PO TB24: 30.0000 mg | ORAL_TABLET | Freq: Every day | ORAL | Status: DC

## 2011-09-10 NOTE — Patient Instructions (Addendum)
Your physician recommends that you schedule a follow-up appointment in: 6 weeks. Your physician has recommended you make the following change in your medication: Decreased isosorbide mononitrate to 30 mg daily. Your new prescription has been sent to your pharmacy. All other medications will remain the same.

## 2011-09-10 NOTE — Assessment & Plan Note (Signed)
Patient's blood pressure is now on the low side. Her can do her dose will be cut back.

## 2011-09-10 NOTE — Assessment & Plan Note (Signed)
I'm hesitant to adjust her meds other than changing 1 at time today. Her a normal be reduced. When I see her back will see if other medication titration can be done.

## 2011-09-10 NOTE — Progress Notes (Signed)
HPI Patient is seen for followup of CHF. Her weight is stable. Her blood pressure is running on the low side.  Allergies  Allergen Reactions  . Atorvastatin   . Citrus Dermatitis    BREAK OUT ON BODY  . Iodine   . Iohexol      Desc: ANAPHYLAXIS   . Zolpidem Tartrate     REACTION: hallucinations  . Milk-Related Compounds Rash    Current Outpatient Prescriptions  Medication Sig Dispense Refill  . acetaminophen (TYLENOL) 650 MG CR tablet Take 650 mg by mouth every 8 (eight) hours as needed.        [name] aspirin 325 MG tablet Take 325 mg by mouth daily.        . carvedilol (COREG) 25 MG tablet Take 1 tablet (25 mg total) by mouth 2 (two) times daily with a meal.  60 tablet  3  . fluticasone (FLONASE) 50 MCG/ACT nasal spray Place 1 spray into the nose daily.  16 g  2  . furosemide (LASIX) 40 MG tablet Take 2 by mouth in the morning and 1 by mouth in the evening  90 tablet  2  . gabapentin (NEURONTIN) 300 MG capsule Take 300 mg by mouth at bedtime.        . hydrocortisone 2.5 % cream Apply 1 application topically as needed.       . hydrOXYzine (ATARAX) 25 MG tablet Take 25 mg by mouth 2 (two) times daily.       . insulin glargine (LANTUS) 100 UNIT/ML injection Inject 90 Units into the skin 2 (two) times daily.        . insulin lispro (HUMALOG) 100 UNIT/ML injection Inject into the skin daily.        . isosorbide mononitrate (IMDUR) 30 MG 24 hr tablet Take 2 tablets (60 mg total) by mouth daily.  30 tablet  5  . levothyroxine (SYNTHROID, LEVOTHROID) 112 MCG tablet Take 56 mcg by mouth daily.        . meclizine (ANTIVERT) 25 MG tablet Take 25 mg by mouth as needed.        . Meclizine HCl (BONINE PO) Take 1 tablet by mouth as needed. Takes either Bonine or Meclizine.      . metFORMIN (GLUMETZA) 500 MG (MOD) 24 hr tablet Take 500 mg by mouth 2 (two) times daily.        . nitroGLYCERIN (NITROSTAT) 0.4 MG SL tablet Place 0.4 mg under the tongue every 5 (five) minutes as needed. May repeat times  three.       . promethazine (PHENERGAN) 25 MG tablet Take 25 mg by mouth every 8 (eight) hours as needed.       . ramipril (ALTACE) 10 MG capsule Take 1 capsule (10 mg total) by mouth daily.  30 capsule  1  . amLODipine (NORVASC) 5 MG tablet Take 5 mg by mouth daily. ON HOLD        History   Social History  . Marital Status: Divorced    Spouse Name: N/A    Number of Children: N/A  . Years of Education: N/A   Occupational History  . Retired    Social History Main Topics  . Smoking status: Never Smoker   . Smokeless tobacco: Never Used  . Alcohol Use: No  . Drug Use: No  . Sexually Active: Not on file   Other Topics Concern  . Not on file   Social History Narrative  . No narrative on  file    Family History  Problem Relation Age of Onset  . Heart disease Other   . Heart disease Mother   . Hypertension Mother   . Heart disease Father   . Hypertension Father     Past Medical History  Diagnosis Date  . Hypertension     Unspecified  . Hyperlipidemia     Mixed  . Coronary artery disease 08/21/09    Catheterization 2006 occluded OM vein graft  /   nuclear 2008 question slight ischemia  . S/P CABG (coronary artery bypass graft) 2003     2003  LIMA, LAD, SVG to the OM, SVG right coronary artery  . Aneurysm 2003    Ascending thoracic aneurysm repair with a graft at time of CABG  . Diabetes mellitus     Insulin dependent  . Gastroparesis   . Depression   . Allergy history, radiographic dye     Contrast dye allergy  . Ejection fraction     EF 50%  //  Ejection fraction 25-30% at the time of stress echo,  May, 2013  . Previous back surgery   . Syncope 2008    Question?  2008  . Statin intolerance   . Shortness of breath     Exertional shortness of breath, May, 2013  . Systolic CHF     June, 2013  . Cardiomyopathy     June, 2013  . Thyroid disease     Past Surgical History  Procedure Date  . Coronary artery bypass graft 2003     LIMA, LAD, SVG to the OM, SVG  right coronary artery  . Bladder tacking   . Breast surgery   . Salivary gland surgery   . Total abdominal hysterectomy   . Back surgery     ROS   Patient denies fever, chills, headache, sweats, rash, change in vision, change in hearing,  Chest pain, cough, nausea vomiting, urinary symptoms. She is having some nasal congestion that is being treated. All other systems are reviewed and are negative.  PHYSICAL EXAM  Patient is in a wheelchair. She is stable. There is no jugular venous distention. Lungs are clear. Respiratory effort is nonlabored. Cardiac exam reveals S1 and S2. There no clicks or significant murmurs. Abdomen is soft. There is no significant peripheral edema today.  Filed Vitals:   09/10/11 1449  BP: 87/57  Pulse: 87  Height: 5\' 5"  (1.651 m)  Weight: 196 lb (88.905 kg)    ASSESSMENT & PLAN

## 2011-09-15 ENCOUNTER — Telehealth: Payer: Self-pay | Admitting: Family Medicine

## 2011-09-17 NOTE — Telephone Encounter (Signed)
Please call and triage pt, schedule an appt if needed , the vertigo is not new and she has been seen by ENT, if her vision is changing or worsening with other CVA symptoms send to ER Her symptoms could be coming from multiple problems

## 2011-09-20 ENCOUNTER — Emergency Department (HOSPITAL_COMMUNITY)
Admission: EM | Admit: 2011-09-20 | Discharge: 2011-09-21 | Disposition: A | Discharge status: Home / Self Care | Payer: Medicare Other | Attending: Emergency Medicine | Admitting: Emergency Medicine

## 2011-09-20 ENCOUNTER — Emergency Department (HOSPITAL_COMMUNITY): Payer: Medicare Other

## 2011-09-20 ENCOUNTER — Encounter (HOSPITAL_COMMUNITY): Payer: Self-pay

## 2011-09-20 DIAGNOSIS — Z794 Long term (current) use of insulin: Secondary | ICD-10-CM | POA: Insufficient documentation

## 2011-09-20 DIAGNOSIS — I10 Essential (primary) hypertension: Secondary | ICD-10-CM | POA: Insufficient documentation

## 2011-09-20 DIAGNOSIS — R200 Anesthesia of skin: Secondary | ICD-10-CM

## 2011-09-20 DIAGNOSIS — I25810 Atherosclerosis of coronary artery bypass graft(s) without angina pectoris: Secondary | ICD-10-CM | POA: Insufficient documentation

## 2011-09-20 DIAGNOSIS — E119 Type 2 diabetes mellitus without complications: Secondary | ICD-10-CM | POA: Insufficient documentation

## 2011-09-20 DIAGNOSIS — H538 Other visual disturbances: Secondary | ICD-10-CM | POA: Insufficient documentation

## 2011-09-20 DIAGNOSIS — R209 Unspecified disturbances of skin sensation: Secondary | ICD-10-CM | POA: Insufficient documentation

## 2011-09-20 LAB — CBC WITH DIFFERENTIAL/PLATELET
Eosinophils Absolute: 0.2 10*3/uL (ref 0.0–0.7)
Eosinophils Relative: 2 % (ref 0–5)
HCT: 39.3 % (ref 36.0–46.0)
Hemoglobin: 13.2 g/dL (ref 12.0–15.0)
Lymphocytes Relative: 47 % — ABNORMAL HIGH (ref 12–46)
Lymphs Abs: 5.1 10*3/uL — ABNORMAL HIGH (ref 0.7–4.0)
MCH: 29.9 pg (ref 26.0–34.0)
MCHC: 33.6 g/dL (ref 30.0–36.0)
MCV: 89.1 fL (ref 78.0–100.0)
Monocytes Absolute: 0.8 10*3/uL (ref 0.1–1.0)
Monocytes Relative: 7 % (ref 3–12)
RBC: 4.41 MIL/uL (ref 3.87–5.11)
WBC: 10.9 10*3/uL — ABNORMAL HIGH (ref 4.0–10.5)

## 2011-09-20 LAB — URINALYSIS, ROUTINE W REFLEX MICROSCOPIC
Bilirubin Urine: NEGATIVE
Glucose, UA: NEGATIVE mg/dL
Hgb urine dipstick: NEGATIVE
Ketones, ur: NEGATIVE mg/dL
Protein, ur: NEGATIVE mg/dL
pH: 6 (ref 5.0–8.0)

## 2011-09-20 LAB — BASIC METABOLIC PANEL
CO2: 25 mEq/L (ref 19–32)
Calcium: 9.3 mg/dL (ref 8.4–10.5)
Chloride: 101 mEq/L (ref 96–112)
Creatinine, Ser: 0.83 mg/dL (ref 0.50–1.10)
Glucose, Bld: 189 mg/dL — ABNORMAL HIGH (ref 70–99)

## 2011-09-20 LAB — URINE MICROSCOPIC-ADD ON

## 2011-09-20 MED ORDER — GABAPENTIN 300 MG PO CAPS
300.0000 mg | ORAL_CAPSULE | Freq: Once | ORAL | Status: DC
  Filled 2011-09-20: qty 1

## 2011-09-20 NOTE — ED Notes (Signed)
CBG was 178. Notified Nurse.

## 2011-09-20 NOTE — ED Notes (Signed)
Report given to Leslie, RN.

## 2011-09-20 NOTE — ED Provider Notes (Signed)
History     CSN: [id number]  Arrival date & time 09/20/11  2148   First MD Initiated Contact with Patient 09/20/11 2206      Chief Complaint  Patient presents with  . Numbness    (Consider location/radiation/quality/duration/timing/severity/associated sxs/prior treatment) The history is provided by the patient. No language interpreter was used.  cc:  78 year-old female coming in by EMS with complaint of numbness in her feet x1 week, numbness in her hands x2 days, and numbness to her left face and lips started today. States she's also had blurred vision for 3 days. Patient denies shortness of breath, chest pain, nausea, vomiting, unsteady gait.  Patient lives alone. She goes to Dr. Andrey Campanile in [place] for her PCP, Dr. Myrtis Ser for cardiology.  Patient does live alone. States that her niece lived with her for 2 months recently that started taking drugs and stealing from her. Also states recently she had vertigo and received physical therapy at home and the vertigo has resolved. Patient has a past medical history of diabetes, hypertension, hyperlipidemia, CAD, CABG, gastroparesis, syncope, CHF,  Past Medical History  Diagnosis Date  . Hypertension     Unspecified  . Hyperlipidemia     Mixed  . Coronary artery disease 08/21/09    Catheterization 2006 occluded OM vein graft  /   nuclear 2008 question slight ischemia  . S/P CABG (coronary artery bypass graft) 2003     2003  LIMA, LAD, SVG to the OM, SVG right coronary artery  . Aneurysm 2003    Ascending thoracic aneurysm repair with a graft at time of CABG  . Diabetes mellitus     Insulin dependent  . Gastroparesis   . Depression   . Allergy history, radiographic dye     Contrast dye allergy  . Ejection fraction     EF 50%  //  Ejection fraction 25-30% at the time of stress echo,  May, 2013  . Previous back surgery   . Syncope 2008    Question?  2008  . Statin intolerance   . Shortness of breath     Exertional shortness of breath,  May, 2013  . Systolic CHF     June, 2013  . Cardiomyopathy     June, 2013  . Thyroid disease     Past Surgical History  Procedure Date  . Coronary artery bypass graft 2003     LIMA, LAD, SVG to the OM, SVG right coronary artery  . Bladder tacking   . Breast surgery   . Salivary gland surgery   . Total abdominal hysterectomy   . Back surgery     Family History  Problem Relation Age of Onset  . Heart disease Other   . Heart disease Mother   . Hypertension Mother   . Heart disease Father   . Hypertension Father     History  Substance Use Topics  . Smoking status: Never Smoker   . Smokeless tobacco: Never Used  . Alcohol Use: No    OB History    Grav Para Term Preterm Abortions TAB SAB Ect Mult Living                  Review of Systems  Constitutional: Negative.  Negative for fever.  HENT: Negative.  Negative for rhinorrhea, mouth sores and trouble swallowing.   Eyes: Negative.   Respiratory: Negative.  Negative for shortness of breath.   Cardiovascular: Negative.  Negative for chest pain, palpitations and leg swelling.  Gastrointestinal: Negative.  Negative for abdominal pain.  Genitourinary: Negative for urgency, frequency, vaginal bleeding, vaginal discharge, enuresis and dyspareunia.  Neurological: Positive for numbness. Negative for dizziness, seizures, facial asymmetry, speech difficulty, weakness and light-headedness.  Psychiatric/Behavioral: Negative.   All other systems reviewed and are negative.    Allergies  Atorvastatin; Citrus; Iodine; Iohexol; Zolpidem tartrate; and Milk-related compounds  Home Medications   Current Outpatient Rx  Name Route Sig Dispense Refill  . ACETAMINOPHEN ER 650 MG PO TBCR Oral Take 650 mg by mouth every 8 (eight) hours as needed.      [name] AMLODIPINE BESYLATE 5 MG PO TABS Oral Take 5 mg by mouth daily. ON HOLD    . ASPIRIN 325 MG PO TABS Oral Take 325 mg by mouth daily.      [name] CARVEDILOL 25 MG PO TABS Oral Take 1 tablet  (25 mg total) by mouth 2 (two) times daily with a meal. 60 tablet 3    DOSE INCREASE  . FLUTICASONE PROPIONATE 50 MCG/ACT NA SUSP Nasal Place 1 spray into the nose daily. 16 g 2  . FUROSEMIDE 40 MG PO TABS  Take 2 by mouth in the morning and 1 by mouth in the evening 90 tablet 2    *dose increase* Please cancel any furosemide 20 m ...  . GABAPENTIN 300 MG PO CAPS Oral Take 300 mg by mouth at bedtime.      [name] HYDROCORTISONE 2.5 % EX CREA Topical Apply 1 application topically as needed.     [name] HYDROXYZINE HCL 25 MG PO TABS Oral Take 25 mg by mouth 2 (two) times daily.     . INSULIN GLARGINE 100 UNIT/ML [place] SOLN Subcutaneous Inject 90 Units into the skin 2 (two) times daily.      . INSULIN LISPRO (HUMAN) 100 UNIT/ML [place] SOLN Subcutaneous Inject into the skin daily.      . ISOSORBIDE MONONITRATE ER 30 MG PO TB24 Oral Take 1 tablet (30 mg total) by mouth daily. 30 tablet 3    Dose decrease Please profile prescription for pat ...  . LEVOTHYROXINE SODIUM 112 MCG PO TABS Oral Take 56 mcg by mouth daily.      [name] MECLIZINE HCL 25 MG PO TABS Oral Take 25 mg by mouth as needed.      [name] BONINE PO Oral Take 1 tablet by mouth as needed. Takes either Bonine or Meclizine.    [name] METFORMIN HCL ER (MOD) 500 MG PO TB24 Oral Take 500 mg by mouth 2 (two) times daily.      [name] NITROGLYCERIN 0.4 MG SL SUBL Sublingual Place 0.4 mg under the tongue every 5 (five) minutes as needed. May repeat times three.     [name] PROMETHAZINE HCL 25 MG PO TABS Oral Take 25 mg by mouth every 8 (eight) hours as needed.     [name] RAMIPRIL 10 MG PO CAPS Oral Take 1 capsule (10 mg total) by mouth daily. 30 capsule 1    *dose increase*    Temp 98.9 F (37.2 C) (Oral)  Ht 5\' 5"  (1.651 m)  Wt 195 lb (88.451 kg)  BMI 32.45 kg/m2  SpO2 97%  Physical Exam  Nursing note and vitals reviewed. Constitutional: She is oriented to person, place, and time. She appears well-developed and well-nourished.  HENT:  Head: Normocephalic and atraumatic.  Eyes:  Conjunctivae and EOM are normal. Pupils are equal, round, and reactive to light.  Neck: Normal range of motion. Neck supple.  Cardiovascular: Normal rate.  Pulmonary/Chest: Effort normal.  Abdominal: Soft.  Musculoskeletal: Normal range of motion. She exhibits no edema and no tenderness.  Neurological: She is alert and oriented to person, place, and time. She has normal strength and normal reflexes. No cranial nerve deficit or sensory deficit. She displays a negative Romberg sign. GCS eye subscore is 4. GCS verbal subscore is 5. GCS motor subscore is 6.  Skin: Skin is warm and dry.       Sensation in tact to L face Patient can distinguish sharp from dull.  Psychiatric: She has a normal mood and affect.    ED Course  Procedures (including critical care time)   Labs Reviewed  CBC WITH DIFFERENTIAL  URINALYSIS, ROUTINE W REFLEX MICROSCOPIC  BASIC METABOLIC PANEL   No results found.   No diagnosis found.    MDM  Numbness in feet, L face and lips x 3 days.  cT head negative for acute process.  Ambulating without difficulty.  Suspect diabetic neuropathy. Follow up with pcp Tuesday.  May consider neurontin to be started.  Neuro in tact.  Good sensation test. + coordination. Return to ER for weakness, memory problems, difficulty ambulating.  Shared visit with Dr. Lavella Lemons.    Labs Reviewed  CBC WITH DIFFERENTIAL - Abnormal; Notable for the following:    WBC 10.9 (*)     Lymphocytes Relative 47 (*)     Lymphs Abs 5.1 (*)     All other components within normal limits  URINALYSIS, ROUTINE W REFLEX MICROSCOPIC - Abnormal; Notable for the following:    Leukocytes, UA SMALL (*)     All other components within normal limits  BASIC METABOLIC PANEL - Abnormal; Notable for the following:    Glucose, Bld 189 (*)     GFR calc non Af Amer 65 (*)     GFR calc Af Amer 76 (*)     All other components within normal limits  GLUCOSE, CAPILLARY - Abnormal; Notable for the following:     Glucose-Capillary 178 (*)     All other components within normal limits  URINE MICROSCOPIC-ADD ON - Abnormal; Notable for the following:    Squamous Epithelial / LPF MANY (*)     Bacteria, UA FEW (*)     All other components within normal limits  URINE CULTURE    Date: 09/22/2011  Rate:86  Rhythm: normal sinus rhythm  QRS Axis: normal  Intervals: QT prolonged  ST/T Wave abnormalities: normal  Conduction Disutrbances:none  Narrative Interpretation: pvc's occ  Old EKG Reviewed: unchanged        Remi Haggard, NP 09/22/11 1138

## 2011-09-20 NOTE — ED Notes (Signed)
Pt has had blurred vision numbness in her feet for several weeks, starting today she started having numbness in her hands and around her mouth.

## 2011-09-21 NOTE — ED Provider Notes (Signed)
History     CSN: [id number]  Arrival date & time 09/20/11  2148   First MD Initiated Contact with Patient 09/20/11 2206      Chief Complaint  Patient presents with  . Numbness    (Consider location/radiation/quality/duration/timing/severity/associated sxs/prior treatment) HPI  Past Medical History  Diagnosis Date  . Hypertension     Unspecified  . Hyperlipidemia     Mixed  . Coronary artery disease 08/21/09    Catheterization 2006 occluded OM vein graft  /   nuclear 2008 question slight ischemia  . S/P CABG (coronary artery bypass graft) 2003     2003  LIMA, LAD, SVG to the OM, SVG right coronary artery  . Aneurysm 2003    Ascending thoracic aneurysm repair with a graft at time of CABG  . Diabetes mellitus     Insulin dependent  . Gastroparesis   . Depression   . Allergy history, radiographic dye     Contrast dye allergy  . Ejection fraction     EF 50%  //  Ejection fraction 25-30% at the time of stress echo,  May, 2013  . Previous back surgery   . Syncope 2008    Question?  2008  . Statin intolerance   . Shortness of breath     Exertional shortness of breath, May, 2013  . Systolic CHF     June, 2013  . Cardiomyopathy     June, 2013  . Thyroid disease     Past Surgical History  Procedure Date  . Coronary artery bypass graft 2003     LIMA, LAD, SVG to the OM, SVG right coronary artery  . Bladder tacking   . Breast surgery   . Salivary gland surgery   . Total abdominal hysterectomy   . Back surgery     Family History  Problem Relation Age of Onset  . Heart disease Other   . Heart disease Mother   . Hypertension Mother   . Heart disease Father   . Hypertension Father     History  Substance Use Topics  . Smoking status: Never Smoker   . Smokeless tobacco: Never Used  . Alcohol Use: No    OB History    Grav Para Term Preterm Abortions TAB SAB Ect Mult Living                  Review of Systems  Allergies  Atorvastatin; Citrus; Iodine;  Iohexol; Zolpidem tartrate; and Milk-related compounds  Home Medications   Current Outpatient Rx  Name Route Sig Dispense Refill  . ACETAMINOPHEN ER 650 MG PO TBCR Oral Take 650 mg by mouth every 8 (eight) hours as needed.     . ASPIRIN 325 MG PO TABS Oral Take 325 mg by mouth daily.     [name] CARVEDILOL 25 MG PO TABS Oral Take 25 mg by mouth 2 (two) times daily with a meal.    . FLUTICASONE PROPIONATE 50 MCG/ACT NA SUSP Nasal Place 1 spray into the nose daily as needed. For allergies    . FUROSEMIDE 40 MG PO TABS Oral Take 40-80 mg by mouth See admin instructions. Take 2 tablets by mouth in the morning and 1tablet by mouth in the evening.    [name] GABAPENTIN 300 MG PO CAPS Oral Take 300 mg by mouth at bedtime.     [name] HYDROXYZINE HCL 25 MG PO TABS Oral Take 25 mg by mouth 2 (two) times daily.     . INSULIN GLARGINE  100 UNIT/ML [place] SOLN Subcutaneous Inject 90 Units into the skin 2 (two) times daily.     . INSULIN LISPRO (HUMAN) 100 UNIT/ML [place] SOLN Subcutaneous Inject 2-10 Units into the skin 4 (four) times daily as needed. Sliding scale    . ISOSORBIDE MONONITRATE ER 30 MG PO TB24 Oral Take 30 mg by mouth daily.    [name] LEVOTHYROXINE SODIUM 112 MCG PO TABS Oral Take 56 mcg by mouth daily.     [name] MECLIZINE HCL 25 MG PO TABS Oral Take 25 mg by mouth 3 (three) times daily as needed. For vertigo    . BONINE PO Oral Take 1 tablet by mouth daily as needed. Takes either Bonine or Meclizine for vertigo.    [name] METFORMIN HCL ER (MOD) 500 MG PO TB24 Oral Take 500 mg by mouth 2 (two) times daily.     [name] PROMETHAZINE HCL 25 MG PO TABS Oral Take 25 mg by mouth every 8 (eight) hours as needed. For nausea    . RAMIPRIL 10 MG PO CAPS Oral Take 1 capsule (10 mg total) by mouth daily. 30 capsule 1    *dose increase*  . NITROGLYCERIN 0.4 MG SL SUBL Sublingual Place 0.4 mg under the tongue every 5 (five) minutes as needed. May repeat times three.       BP 165/72  Pulse 87  Temp 98.9 F (37.2 C) (Oral)  Resp 16  Ht 5\' 5"   (1.651 m)  Wt 195 lb (88.451 kg)  BMI 32.45 kg/m2  SpO2 97%  Physical Exam  ED Course  Procedures (including critical care time)  Labs Reviewed  CBC WITH DIFFERENTIAL - Abnormal; Notable for the following:    WBC 10.9 (*)     Lymphocytes Relative 47 (*)     Lymphs Abs 5.1 (*)     All other components within normal limits  URINALYSIS, ROUTINE W REFLEX MICROSCOPIC - Abnormal; Notable for the following:    Leukocytes, UA SMALL (*)     All other components within normal limits  BASIC METABOLIC PANEL - Abnormal; Notable for the following:    Glucose, Bld 189 (*)     GFR calc non Af Amer 65 (*)     GFR calc Af Amer 76 (*)     All other components within normal limits  GLUCOSE, CAPILLARY - Abnormal; Notable for the following:    Glucose-Capillary 178 (*)     All other components within normal limits  URINE MICROSCOPIC-ADD ON - Abnormal; Notable for the following:    Squamous Epithelial / LPF MANY (*)     Bacteria, UA FEW (*)     All other components within normal limits  URINE CULTURE   Ct Head Wo Contrast  09/20/2011  *RADIOLOGY REPORT*  Clinical Data: Blurred vision and numbness of the feet hands and mouth.  CT HEAD WITHOUT CONTRAST  Technique:  Contiguous axial images were obtained from the base of the skull through the vertex without contrast.  Comparison: 02/19/2006  Findings: There is mild progression of cortical atrophy since the prior study.  Stable small vessel disease and old lacunar infarcts involving the basal ganglia and internal capsules bilaterally. There is no evidence of hemorrhage, acute infarction, edema, mass effect, extra-axial fluid collection, hydrocephalus or mass lesion. The skull is unremarkable.  IMPRESSION: Progression of cortical atrophy.  Stable small vessel disease and old lacunar infarcts.  No acute findings.   Original Report Authenticated By: Reola Calkins, M.D.      No  diagnosis found.    MDM  78 yo poorly controlled diabetic seen & examined  with FNP Crawford. I agree with Ms. Crawfords documentation. C/O severeal weeks of paresthesias in both hands, feet and the periorbital region bilaterally. Has also had 2 mo of blurred vision. Neuro exam is unremarkable. Pt with suspected diabetic neuropathy. Stable for d/c. Diabetic neuropathy explained to patient. She has f/u on Sept 12 with PCP.          Brandt Loosen, MD 09/21/11 [phone]

## 2011-09-23 ENCOUNTER — Other Ambulatory Visit: Payer: Self-pay | Admitting: Cardiology

## 2011-09-24 LAB — URINE CULTURE

## 2011-09-25 ENCOUNTER — Telehealth: Payer: Self-pay | Admitting: Family Medicine

## 2011-09-25 NOTE — Telephone Encounter (Signed)
Advised I may be getting some samples of Lantus and would call if I did. States she is in the donut hole so I filled out a form for the health dept that needs your signature.

## 2011-09-27 NOTE — ED Notes (Signed)
+  Urine. Chart sent to EDP office for review. Chart returned from EDP office. Prescribed Cipro 250 mg PO BID x 3 days. #6. Prescribed by Grant Fontana PA-C.

## 2011-09-28 NOTE — ED Notes (Signed)
Called patient and informed them of +Urine and new Rx. Wants Rx called to [facility] and would like to have it delivered.

## 2011-09-29 ENCOUNTER — Telehealth: Payer: Self-pay | Admitting: *Deleted

## 2011-09-29 NOTE — Telephone Encounter (Signed)
Patient called to inform MD that she has a bladder infection and wanted to know whether or not she needed to change the way she was taking her fluid pills. Nurse advised patient that unless a physician requests the medications be changed, to continue as before. Patient verbalized understanding.

## 2011-09-29 NOTE — ED Notes (Signed)
rx called to pharmacy-by Toni PFM. 

## 2011-09-29 NOTE — Telephone Encounter (Signed)
Called patient and she no longer has complaints.

## 2011-09-29 NOTE — Telephone Encounter (Signed)
reviewed

## 2011-10-01 ENCOUNTER — Other Ambulatory Visit: Payer: Self-pay | Admitting: Family Medicine

## 2011-10-02 ENCOUNTER — Encounter: Payer: Self-pay | Admitting: Family Medicine

## 2011-10-02 ENCOUNTER — Ambulatory Visit (INDEPENDENT_AMBULATORY_CARE_PROVIDER_SITE_OTHER): Payer: Medicare Other | Admitting: Family Medicine

## 2011-10-02 VITALS — BP 130/56 | HR 88 | Resp 18 | Ht 65.0 in | Wt 197.0 lb

## 2011-10-02 DIAGNOSIS — N390 Urinary tract infection, site not specified: Secondary | ICD-10-CM

## 2011-10-02 DIAGNOSIS — E1142 Type 2 diabetes mellitus with diabetic polyneuropathy: Secondary | ICD-10-CM

## 2011-10-02 DIAGNOSIS — E1140 Type 2 diabetes mellitus with diabetic neuropathy, unspecified: Secondary | ICD-10-CM

## 2011-10-02 DIAGNOSIS — I10 Essential (primary) hypertension: Secondary | ICD-10-CM

## 2011-10-02 DIAGNOSIS — R42 Dizziness and giddiness: Secondary | ICD-10-CM

## 2011-10-02 DIAGNOSIS — E1149 Type 2 diabetes mellitus with other diabetic neurological complication: Secondary | ICD-10-CM

## 2011-10-02 DIAGNOSIS — E119 Type 2 diabetes mellitus without complications: Secondary | ICD-10-CM

## 2011-10-02 DIAGNOSIS — E039 Hypothyroidism, unspecified: Secondary | ICD-10-CM

## 2011-10-02 LAB — CBC
MCV: 88.8 fL (ref 78.0–100.0)
Platelets: 279 10*3/uL (ref 150–400)
RDW: 15.7 % — ABNORMAL HIGH (ref 11.5–15.5)
WBC: 9.6 10*3/uL (ref 4.0–10.5)

## 2011-10-02 LAB — COMPREHENSIVE METABOLIC PANEL
ALT: 9 U/L (ref 0–35)
CO2: 24 mEq/L (ref 19–32)
Calcium: 10.1 mg/dL (ref 8.4–10.5)
Chloride: 100 mEq/L (ref 96–112)
Potassium: 4.7 mEq/L (ref 3.5–5.3)
Sodium: 138 mEq/L (ref 135–145)
Total Protein: 7.1 g/dL (ref 6.0–8.3)

## 2011-10-02 MED ORDER — CIPROFLOXACIN HCL 500 MG PO TABS: 500.0000 mg | ORAL_TABLET | Freq: Two times a day (BID) | ORAL | Status: AC

## 2011-10-02 MED ORDER — GLIPIZIDE 10 MG PO TABS: 10.0000 mg | ORAL_TABLET | Freq: Every day | ORAL | Status: DC

## 2011-10-02 NOTE — Patient Instructions (Addendum)
For your diabetes- take metformin and glipizide Decrease Lantus to 20 units  Continue sliding scale  Increase neurontin 300mg  twice a day  I will try to set up the vestibular PT again for your vertigo Take the antibiotics twice a day  F/U  2-3 weeks

## 2011-10-03 LAB — T4: T4, Total: 9.4 ug/dL (ref 5.0–12.5)

## 2011-10-05 ENCOUNTER — Encounter: Payer: Self-pay | Admitting: Family Medicine

## 2011-10-05 DIAGNOSIS — N390 Urinary tract infection, site not specified: Secondary | ICD-10-CM | POA: Insufficient documentation

## 2011-10-05 NOTE — Assessment & Plan Note (Signed)
Uncontrolled, as she is often without medication, will try to get her on oral meds with lower doses of insulin Given samples in office Lantus 20 units QHS, continue SSI with humalog as her niece has a supply of this, start glipizde 10mg , continue metformin Recheck with log book in 3 weeks

## 2011-10-05 NOTE — Assessment & Plan Note (Signed)
Good control at home, no change to meds

## 2011-10-05 NOTE — Assessment & Plan Note (Signed)
Refer for vestibular PT

## 2011-10-05 NOTE — Assessment & Plan Note (Signed)
Persistent symptoms I will give her additional treatment

## 2011-10-05 NOTE — Progress Notes (Signed)
  Subjective:    Patient ID: Cathy [name], female    DOB: August 20, 1930, 78 y.o.   MRN: [id number]  HPI Pt presents for ER follow-up, she began to have numbness, dizziness and blurred vision and was evaluated in ED. Work-up was negative with exception of proteus UTI, given 3 days of cipro, she continues to have pressure and frequency with urinatation DM- she is prescribed Lantus 90 units BID with sliding scale and metformin, but often does not have the insulin because of cost. CBG have been from 120-200's fasting, this AM 220, no insulin in > 1 week. Blood pressures range 114-147/70-80.  She would also like to restart vestibular PT for her vertigo as this helped. Here with neice   Review of Systems  GEN- denies fatigue, fever, weight loss,weakness, recent illness HEENT- denies eye drainage, change in vision, nasal discharge, CVS- denies chest pain, palpitations RESP- denies SOB, cough, wheeze ABD- denies N/V, change in stools, abd pain GU- denies dysuria, hematuria, dribbling, incontinence MSK- denies joint pain, muscle aches, injury Neuro- denies headache, +dizziness, syncope, seizure activity      Objective:   Physical Exam GEN- NAD, alert and oriented x3, sitting in wheelchair HEENT- PERRL, EOMI, non injected sclera, pink conjunctiva, oropharynx clear, Left TM-  no effusion seen, Right TM clear  Neck- Supple,  CVS- RRR, no murmur RESP-CTAB,  ABD-NABS,soft,mild suprapubic tenderness, no CVA tenderness EXT- No edema, venous stasis changes Pulses- Radial 2+        Assessment & Plan:

## 2011-10-05 NOTE — Assessment & Plan Note (Signed)
Neuropathy uncontrolled, increase to neurontin to BID dosing

## 2011-10-06 NOTE — Addendum Note (Signed)
Addended by: Milinda Antis F on: 10/06/2011 05:24 PM   Modules accepted: Orders

## 2011-10-14 ENCOUNTER — Other Ambulatory Visit: Payer: Self-pay | Admitting: Family Medicine

## 2011-10-16 ENCOUNTER — Telehealth: Payer: Self-pay | Admitting: Family Medicine

## 2011-10-17 ENCOUNTER — Telehealth: Payer: Self-pay | Admitting: Family Medicine

## 2011-10-17 MED ORDER — INSULIN GLARGINE 100 UNIT/ML ~~LOC~~ SOLN: 20.0000 [IU] | Freq: Every day | SUBCUTANEOUS | Status: DC

## 2011-10-17 NOTE — Telephone Encounter (Signed)
SENT IN

## 2011-10-17 NOTE — Telephone Encounter (Signed)
lantus was reduced to 20. This was sent in per previous message

## 2011-10-20 ENCOUNTER — Other Ambulatory Visit: Payer: Self-pay | Admitting: Family Medicine

## 2011-10-23 ENCOUNTER — Ambulatory Visit: Payer: Medicare Other | Admitting: Family Medicine

## 2011-10-24 ENCOUNTER — Ambulatory Visit (INDEPENDENT_AMBULATORY_CARE_PROVIDER_SITE_OTHER): Payer: Medicare Other | Admitting: Family Medicine

## 2011-10-24 ENCOUNTER — Emergency Department (HOSPITAL_COMMUNITY)
Admission: EM | Admit: 2011-10-24 | Discharge: 2011-10-24 | Disposition: A | Discharge status: Home / Self Care | Payer: Medicare Other | Attending: Emergency Medicine | Admitting: Emergency Medicine

## 2011-10-24 ENCOUNTER — Encounter (HOSPITAL_COMMUNITY): Payer: Self-pay | Admitting: *Deleted

## 2011-10-24 ENCOUNTER — Encounter: Payer: Self-pay | Admitting: Family Medicine

## 2011-10-24 ENCOUNTER — Emergency Department (HOSPITAL_COMMUNITY): Payer: Medicare Other

## 2011-10-24 VITALS — BP 138/60 | HR 89 | Resp 17 | Ht 65.0 in | Wt 196.0 lb

## 2011-10-24 DIAGNOSIS — I429 Cardiomyopathy, unspecified: Secondary | ICD-10-CM

## 2011-10-24 DIAGNOSIS — Z794 Long term (current) use of insulin: Secondary | ICD-10-CM | POA: Insufficient documentation

## 2011-10-24 DIAGNOSIS — I10 Essential (primary) hypertension: Secondary | ICD-10-CM

## 2011-10-24 DIAGNOSIS — R0602 Shortness of breath: Secondary | ICD-10-CM | POA: Insufficient documentation

## 2011-10-24 DIAGNOSIS — I428 Other cardiomyopathies: Secondary | ICD-10-CM | POA: Insufficient documentation

## 2011-10-24 DIAGNOSIS — I5020 Unspecified systolic (congestive) heart failure: Secondary | ICD-10-CM

## 2011-10-24 DIAGNOSIS — E119 Type 2 diabetes mellitus without complications: Secondary | ICD-10-CM | POA: Insufficient documentation

## 2011-10-24 DIAGNOSIS — M542 Cervicalgia: Secondary | ICD-10-CM

## 2011-10-24 DIAGNOSIS — R739 Hyperglycemia, unspecified: Secondary | ICD-10-CM

## 2011-10-24 DIAGNOSIS — M7989 Other specified soft tissue disorders: Secondary | ICD-10-CM | POA: Insufficient documentation

## 2011-10-24 DIAGNOSIS — I25810 Atherosclerosis of coronary artery bypass graft(s) without angina pectoris: Secondary | ICD-10-CM | POA: Insufficient documentation

## 2011-10-24 LAB — URINALYSIS, ROUTINE W REFLEX MICROSCOPIC
Bilirubin Urine: NEGATIVE
Glucose, UA: NEGATIVE mg/dL
Hgb urine dipstick: NEGATIVE
Ketones, ur: NEGATIVE mg/dL
Leukocytes, UA: NEGATIVE
Nitrite: NEGATIVE
Protein, ur: NEGATIVE mg/dL
Specific Gravity, Urine: >1.03 — ABNORMAL HIGH (ref 1.005–1.030)
Urobilinogen, UA: 0.2 mg/dL (ref 0.0–1.0)
pH: 5.5 (ref 5.0–8.0)

## 2011-10-24 LAB — CBC
MCH: 30.2 pg (ref 26.0–34.0)
Platelets: 202 10*3/uL (ref 150–400)
RBC: 4.44 MIL/uL (ref 3.87–5.11)

## 2011-10-24 LAB — COMPREHENSIVE METABOLIC PANEL
ALT: 12 U/L (ref 0–35)
AST: 13 U/L (ref 0–37)
CO2: 25 mEq/L (ref 19–32)
Calcium: 9.5 mg/dL (ref 8.4–10.5)
GFR calc non Af Amer: 61 mL/min — ABNORMAL LOW (ref >90)
Sodium: 138 mEq/L (ref 135–145)
Total Protein: 6.6 g/dL (ref 6.0–8.3)

## 2011-10-24 MED ORDER — FUROSEMIDE 10 MG/ML IJ SOLN
40.0000 mg | Freq: Once | INTRAMUSCULAR | Status: AC
  Administered 2011-10-24: 40 mg via INTRAVENOUS
  Filled 2011-10-24: qty 4

## 2011-10-24 NOTE — Assessment & Plan Note (Signed)
Uncontrolled diabetes and often unable to afford her insulin. At her last visit she was started on glipizide she's currently on metformin. She will need her insulin titrated. She was given instructions in case she is not admitted to the hospital to increase Lantus to 24 units. A1c is 8%

## 2011-10-24 NOTE — ED Notes (Signed)
C/o shortness of breath - sent by PCP. Denies chest pain; c/o neck pain, which is chronic. A&ox4; answers questions appropriately.

## 2011-10-24 NOTE — Patient Instructions (Signed)
Will send to [facility] to be evaluated -  Continue current medications  Lantus to be changed 24 units

## 2011-10-24 NOTE — ED Notes (Signed)
Patient just waiting on ride at this time.

## 2011-10-24 NOTE — ED Notes (Signed)
Ride arrived, dc home with friend driving

## 2011-10-24 NOTE — Progress Notes (Signed)
  Subjective:    Patient ID: Cathy [name], female    DOB: [birth year], 78 y.o.   MRN: [id number]  HPI Patient presents to followup diabetes. She admits to increased shortness of breath over the past week. She's not taking her Lasix this morning typically takes 80 mg in the a.m. and 40 mg at night. She thought she can make her cardiology appointment next week. She denies change in cough but feels very fatigued and winded with little effort. She's had her typical anginal on and off but nothing that concerned her. Her blood sugars have been 200s to 300s throughout the day. At her last visit her Lantus was decreased to 20 units and she was started on glipizide and continue on her metformin. Her diabetes has been uncontrolled. She admits to blurry vision on and off. She lives alone in a family friend brought her to the appointment today.   Review of Systems  GEN- +fatigue, fever, weight loss,weakness, recent illness HEENT- denies eye drainage,+ change in vision, nasal discharge, CVS- + chest pain on and off, palpitations RESP- + SOB, cough, wheeze ABD- denies N/V, change in stools, abd pain GU- denies dysuria, hematuria, dribbling, incontinence MSK- + joint pain, muscle aches, injury Neuro- denies headache, dizziness, syncope, seizure activity      Objective:   Physical Exam GEN- NAD, alert and oriented x3, sitting in wheelchair, fatigued appearing HEENT- PERRL, EOMI, non injected sclera, pink conjunctiva, oropharynx clear Neck- Supple, decreased ROM CVS- RRR, no murmur RESP-mild basilar crackle, no rhonchi, increased WOB with little exertion, able to speak in sentences but looks winded,no retractions ABD-NABS,soft,NT,ND EXT- No edema, venous stasis changes Pulses- Radial 2+          Assessment & Plan:  Pt here with family friend, vitals stable, no hypoxia, will send to ER for evaluation of SOB, CHF

## 2011-10-24 NOTE — ED Notes (Addendum)
Sent from Dr Deirdre Peer office with sob. Pain post neck that is chronic,  Alert, talking.

## 2011-10-24 NOTE — ED Notes (Signed)
Report received.  RN states calls and messages left for family to pick patient up - to be discharged home.  No response from phone calls at this time.  Will continue to attempt contact.

## 2011-10-24 NOTE — Assessment & Plan Note (Signed)
She has had worsening shortness of breath with minimal exertion. She has severe left ventricular dysfunction CHF. Her lung exam is change from previous other her O2 sats are stable. I'm very concerned as she is elderly with cardiac history and she lives alone. I will prefer her to be evaluated in the emergency room for possible admission for CHF exacerbation

## 2011-10-24 NOTE — Assessment & Plan Note (Signed)
EF of 30% per cardiology note in may of 2013.

## 2011-10-24 NOTE — ED Provider Notes (Cosign Needed Addendum)
History     CSN: [id number]  Arrival date & time 10/24/11  1543   First MD Initiated Contact with Patient 10/24/11 1618      Chief Complaint  Patient presents with  . Shortness of Breath    (Consider location/radiation/quality/duration/timing/severity/associated sxs/prior treatment) Patient is a 78 y.o. female presenting with shortness of breath. The history is provided by the patient.  Shortness of Breath  Associated symptoms include shortness of breath. Pertinent negatives include no chest pain, no fever and no cough.  pt w hx cad/cabg 2003, cardiomyopathy, chf, presents w mild increased wob in past week. Not compliant w lasix today, last took yesterday, otherwise denies any recent change in meds or compliance issues. Denies any current or recent cp. No abrupt change in sob today, states was going to her pcp today for routine follow up appt, who noticed she had mild increased wob so sent to ed. Pt has long standing orthopnea, no recent change, denies pnd. No increased leg swelling, no leg pain. No dvt or pe hx. Denies cough or uri c/o. No fever or chills. Denies any diet or fluid indiscretion, but is not following any specific cardiac diet.   Past Medical History  Diagnosis Date  . Hypertension     Unspecified  . Hyperlipidemia     Mixed  . Coronary artery disease 08/21/09    Catheterization 2006 occluded OM vein graft  /   nuclear 2008 question slight ischemia  . S/P CABG (coronary artery bypass graft) 2003     2003  LIMA, LAD, SVG to the OM, SVG right coronary artery  . Aneurysm 2003    Ascending thoracic aneurysm repair with a graft at time of CABG  . Diabetes mellitus     Insulin dependent  . Gastroparesis   . Depression   . Allergy history, radiographic dye     Contrast dye allergy  . Ejection fraction     EF 50%  //  Ejection fraction 25-30% at the time of stress echo,  May, 2013  . Previous back surgery   . Syncope 2008    Question?  2008  . Statin intolerance   .  Shortness of breath     Exertional shortness of breath, May, 2013  . Systolic CHF     June, 2013  . Cardiomyopathy     June, 2013  . Thyroid disease   . CHF (congestive heart failure)     Past Surgical History  Procedure Date  . Coronary artery bypass graft 2003     LIMA, LAD, SVG to the OM, SVG right coronary artery  . Bladder tacking   . Salivary gland surgery   . Total abdominal hysterectomy   . Back surgery   . Breast surgery   . External ear surgery     Family History  Problem Relation Age of Onset  . Heart disease Other   . Heart disease Mother   . Hypertension Mother   . Heart disease Father   . Hypertension Father     History  Substance Use Topics  . Smoking status: Never Smoker   . Smokeless tobacco: Never Used  . Alcohol Use: No    OB History    Grav Para Term Preterm Abortions TAB SAB Ect Mult Living                  Review of Systems  Constitutional: Negative for fever and chills.  HENT: Negative for neck pain.   Eyes: Negative  for redness.  Respiratory: Positive for shortness of breath. Negative for cough.   Cardiovascular: Negative for chest pain, palpitations and leg swelling.  Gastrointestinal: Negative for abdominal pain.  Genitourinary: Negative for flank pain.  Musculoskeletal: Negative for back pain.  Skin: Negative for rash.  Neurological: Negative for headaches.  Hematological: Does not bruise/bleed easily.  Psychiatric/Behavioral: Negative for confusion.    Allergies  Atorvastatin; Citrus; Iodine; Iohexol; Zolpidem tartrate; and Milk-related compounds  Home Medications   Current Outpatient Rx  Name Route Sig Dispense Refill  . ACETAMINOPHEN ER 650 MG PO TBCR Oral Take 650 mg by mouth every 8 (eight) hours as needed.     [name] ALTACE 10 MG PO CAPS  TAKE ONE CAPSULE DAILY. 30 each 3  . ASPIRIN 325 MG PO TABS Oral Take 325 mg by mouth daily.     [name] CARVEDILOL 25 MG PO TABS Oral Take 25 mg by mouth 2 (two) times daily with a meal.      . FLUTICASONE PROPIONATE 50 MCG/ACT NA SUSP Nasal Place 1 spray into the nose daily as needed. For allergies    . FOLIC ACID 1 MG PO TABS Oral Take 1 mg by mouth daily.    . FUROSEMIDE 40 MG PO TABS Oral Take 40-80 mg by mouth See admin instructions. Take 2 tablets by mouth in the morning and 1tablet by mouth in the evening.    [name] GABAPENTIN 300 MG PO CAPS Oral Take 1 capsule (300 mg total) by mouth 2 (two) times daily. 180 capsule 2    PATIENT STATES TAKING TWICE DAILY NOW. IS THIS COR ...  . GLIPIZIDE 10 MG PO TABS Oral Take 1 tablet (10 mg total) by mouth daily. 30 tablet 3  . HYDROXYZINE HCL 25 MG PO TABS Oral Take 25 mg by mouth 2 (two) times daily.     . INSULIN GLARGINE 100 UNIT/ML [place] SOLN Subcutaneous Inject 24 Units into the skin at bedtime.    . INSULIN LISPRO (HUMAN) 100 UNIT/ML [place] SOLN Subcutaneous Inject 2-10 Units into the skin 4 (four) times daily as needed. Sliding scale    . ISOSORBIDE MONONITRATE ER 30 MG PO TB24 Oral Take 30 mg by mouth daily.    [name] LEVOTHYROXINE SODIUM 112 MCG PO TABS Oral Take 56 mcg by mouth daily.     . MECLIZINE HCL 25 MG PO TABS  TAKE (1) TABLET BY MOUTH (3) TIMES DAILY AS NEEDED. 90 tablet 2  . BONINE PO Oral Take 1 tablet by mouth daily as needed. Takes either Bonine or Meclizine for vertigo.    [name] METFORMIN HCL 500 MG PO TABS Oral Take 1,000 mg by mouth 2 (two) times daily with a meal.    . NITROGLYCERIN 0.4 MG SL SUBL Sublingual Place 0.4 mg under the tongue every 5 (five) minutes as needed. May repeat times three.     [name] PROMETHAZINE HCL 25 MG PO TABS Oral Take 25 mg by mouth every 8 (eight) hours as needed. For nausea    . ZYRTEC 10 MG PO TABS  TAKE ONE TABLET DAILY. 30 each 4    BP 107/82  Pulse 84  Temp 98.2 F (36.8 C) (Oral)  Resp 18  Ht 5\' 5"  (1.651 m)  Wt 196 lb (88.905 kg)  BMI 32.62 kg/m2  SpO2 94%  Physical Exam  Nursing note and vitals reviewed. Constitutional: She is oriented to person, place, and time. She appears well-developed  and well-nourished. No distress.  HENT:  Mouth/Throat: Oropharynx  is clear and moist.  Eyes: Conjunctivae normal are normal. No scleral icterus.  Neck: Neck supple. No tracheal deviation present.  Cardiovascular: Normal rate, regular rhythm, normal heart sounds and intact distal pulses.   Pulmonary/Chest: Effort normal and breath sounds normal. No respiratory distress.  Abdominal: Soft. Normal appearance and bowel sounds are normal. She exhibits no distension. There is no tenderness.  Musculoskeletal: She exhibits no tenderness.       Mild bilateral ankle swelling. No calf swelling/pain or tenderness  Neurological: She is alert and oriented to person, place, and time.  Skin: Skin is warm and dry. No rash noted.  Psychiatric: She has a normal mood and affect.    ED Course  Procedures (including critical care time)   Labs Reviewed  CBC  COMPREHENSIVE METABOLIC PANEL  PRO B NATRIURETIC PEPTIDE  TROPONIN I  URINALYSIS, ROUTINE W REFLEX MICROSCOPIC   Results for orders placed during the hospital encounter of 10/24/11  CBC      Component Value Range   WBC 10.2  4.0 - 10.5 K/uL   RBC 4.44  3.87 - 5.11 MIL/uL   Hemoglobin 13.4  12.0 - 15.0 g/dL   HCT 25.3  66.4 - 40.3 %   MCV 90.5  78.0 - 100.0 fL   MCH 30.2  26.0 - 34.0 pg   MCHC 33.3  30.0 - 36.0 g/dL   RDW 47.4  25.9 - 56.3 %   Platelets 202  150 - 400 K/uL  COMPREHENSIVE METABOLIC PANEL      Component Value Range   Sodium 138  135 - 145 mEq/L   Potassium 4.4  3.5 - 5.1 mEq/L   Chloride 99  96 - 112 mEq/L   CO2 25  19 - 32 mEq/L   Glucose, Bld 189 (*) 70 - 99 mg/dL   BUN 19  6 - 23 mg/dL   Creatinine, Ser 8.75  0.50 - 1.10 mg/dL   Calcium 9.5  8.4 - 64.3 mg/dL   Total Protein 6.6  6.0 - 8.3 g/dL   Albumin 3.3 (*) 3.5 - 5.2 g/dL   AST 13  0 - 37 U/L   ALT 12  0 - 35 U/L   Alkaline Phosphatase 67  39 - 117 U/L   Total Bilirubin 0.4  0.3 - 1.2 mg/dL   GFR calc non Af Amer 61 (*) >90 mL/min   GFR calc Af Amer 71 (*) >90  mL/min  PRO B NATRIURETIC PEPTIDE      Component Value Range   Pro B Natriuretic peptide (BNP) 685.8 (*) 0 - 450 pg/mL  TROPONIN I      Component Value Range   Troponin I <0.30  <0.30 ng/mL  URINALYSIS, ROUTINE W REFLEX MICROSCOPIC      Component Value Range   Color, Urine YELLOW  YELLOW   APPearance CLEAR  CLEAR   Specific Gravity, Urine >1.030 (*) 1.005 - 1.030   pH 5.5  5.0 - 8.0   Glucose, UA NEGATIVE  NEGATIVE mg/dL   Hgb urine dipstick NEGATIVE  NEGATIVE   Bilirubin Urine NEGATIVE  NEGATIVE   Ketones, ur NEGATIVE  NEGATIVE mg/dL   Protein, ur NEGATIVE  NEGATIVE mg/dL   Urobilinogen, UA 0.2  0.0 - 1.0 mg/dL   Nitrite NEGATIVE  NEGATIVE   Leukocytes, UA NEGATIVE  NEGATIVE   Dg Chest 2 View  10/24/2011  *RADIOLOGY REPORT*  Clinical Data: Shortness of breath  CHEST - 2 VIEW  Comparison: 05/02/2011  Findings: Cardiomediastinal silhouette  is stable.  Status post CABG again noted.  Levoscoliosis thoracic spine again noted. Atherosclerotic calcifications of thoracic aorta.  Bony thorax is stable.  No acute infiltrate or pulmonary edema. Degenerative changes bilateral shoulders.  Bilateral spurring of the humeral head.  IMPRESSION: No active disease.  No significant change.   Original Report Authenticated By: Natasha Mead, M.D.       MDM  Iv ns. Labs. Cxr.  Reviewed nursing notes and prior charts for additional history.    Date: 10/24/2011  Rate: 86  Rhythm: normal sinus rhythm  QRS Axis: left  Intervals: normal  ST/T Wave abnormalities: nonspecific T wave changes  Conduction Disutrbances:none  Narrative Interpretation:   Old EKG Reviewed: unchanged   Pt had not taken her lasix today, given dose in ed.  Recheck pt, asking for food/drink, states had not eaten today. Given meal tray, ate well.  Recheck no increased wob.  Trop neg. No current or recent cp. Afeb. No chills or sweats. Denies pain.   Pt states feels breathing at baseline, denies any pain or new c/o, feels  ready for d/c.  Recheck bp 117/78, hr 84 rr 18, po 97% afeb.   Discussed plan for close pcp follow up 1-2 days, pt has card f/u already arranged in coming week as well.         Suzi Roots, MD 10/24/11 1610  Suzi Roots, MD 10/24/11 [phone]

## 2011-10-24 NOTE — ED Notes (Signed)
Patient ambulatory to [facility] 

## 2011-10-24 NOTE — Assessment & Plan Note (Signed)
Fairly well controlled 

## 2011-10-27 ENCOUNTER — Telehealth: Payer: Self-pay | Admitting: Family Medicine

## 2011-10-27 NOTE — Telephone Encounter (Signed)
Spoke to pt, states she is having a "bad day: but breathing is much better.  BS are > 300, increase lantus to 30 units at bedtime She will need f/u 6 weeks Has cards appt this Friday

## 2011-10-31 ENCOUNTER — Telehealth: Payer: Self-pay | Admitting: Family Medicine

## 2011-10-31 ENCOUNTER — Telehealth: Payer: Self-pay | Admitting: Cardiology

## 2011-10-31 ENCOUNTER — Ambulatory Visit: Payer: Medicare Other | Admitting: Cardiology

## 2011-10-31 NOTE — Telephone Encounter (Signed)
She has had diarrhea all morning, she too 6 anti-diarrhea pills and this has now stopped, she felt fine last night, denies emesis, but has nausea, phenergan has been taken already Her CBG this morning fasting was 470, last night 166, yesterday AM 250.  She has not had any insulin this AM or any food, sipping gingerale Repeat CBG while I was on the phone was 311, advised her to eat small bland snack now that diarrhea and nausea has subsided, take 5 units of novolog.  Given red flags, her grand-daughter was at the home and was sick She missed her cardiology appt but has been rescheduled Given red flags to call back or go to ER if needed

## 2011-10-31 NOTE — Telephone Encounter (Signed)
Patient is requesting to be seen earlier than January 2014 since she had to cancel today's appointment.

## 2011-10-31 NOTE — Telephone Encounter (Signed)
Patient called to reschedule her appt today due to having a stomach virus.  She said she can not wait until Dr Myrtis Ser first available in the afternoon which was Jan 8. She said that he has been working with her as far as getting her added to his schedules

## 2011-11-03 ENCOUNTER — Telehealth: Payer: Self-pay | Admitting: Family Medicine

## 2011-11-04 NOTE — Telephone Encounter (Signed)
Per Dr. Myrtis Ser, patient will be added to October 18th 2013 schedule.

## 2011-11-04 NOTE — Telephone Encounter (Signed)
Patient called back on yesterday inquiring about her appointment.

## 2011-11-05 MED ORDER — INSULIN GLARGINE 100 UNIT/ML ~~LOC~~ SOLN: 24.0000 [IU] | Freq: Every day | SUBCUTANEOUS | Status: DC

## 2011-11-05 MED ORDER — INSULIN LISPRO 100 UNIT/ML ~~LOC~~ SOLN: 2.0000 [IU] | Freq: Four times a day (QID) | SUBCUTANEOUS | Status: DC | PRN

## 2011-11-05 NOTE — Telephone Encounter (Signed)
meds sent in

## 2011-11-05 NOTE — Telephone Encounter (Signed)
You can try, but send a refill through her pharmacy as well

## 2011-11-05 NOTE — Telephone Encounter (Signed)
Want to call for samples for her?

## 2011-11-07 ENCOUNTER — Ambulatory Visit (INDEPENDENT_AMBULATORY_CARE_PROVIDER_SITE_OTHER): Payer: Medicare Other | Admitting: Cardiology

## 2011-11-07 ENCOUNTER — Encounter: Payer: Self-pay | Admitting: Cardiology

## 2011-11-07 VITALS — BP 88/58 | HR 97 | Ht 65.0 in | Wt 196.0 lb

## 2011-11-07 DIAGNOSIS — M542 Cervicalgia: Secondary | ICD-10-CM

## 2011-11-07 DIAGNOSIS — I429 Cardiomyopathy, unspecified: Secondary | ICD-10-CM

## 2011-11-07 DIAGNOSIS — R110 Nausea: Secondary | ICD-10-CM

## 2011-11-07 DIAGNOSIS — I428 Other cardiomyopathies: Secondary | ICD-10-CM

## 2011-11-07 DIAGNOSIS — I2510 Atherosclerotic heart disease of native coronary artery without angina pectoris: Secondary | ICD-10-CM

## 2011-11-07 DIAGNOSIS — E785 Hyperlipidemia, unspecified: Secondary | ICD-10-CM

## 2011-11-07 DIAGNOSIS — I5020 Unspecified systolic (congestive) heart failure: Secondary | ICD-10-CM

## 2011-11-07 DIAGNOSIS — F329 Major depressive disorder, single episode, unspecified: Secondary | ICD-10-CM

## 2011-11-07 DIAGNOSIS — I959 Hypotension, unspecified: Secondary | ICD-10-CM

## 2011-11-07 MED ORDER — RAMIPRIL 5 MG PO CAPS: 5.0000 mg | ORAL_CAPSULE | Freq: Every day | ORAL | Status: DC

## 2011-11-07 NOTE — Assessment & Plan Note (Signed)
In the office today she has relative hypotension. Her diuretic dose will be decreased to 40 daily. Ramapril will be cut from 10-5 mg daily. Imdur  will be put on hold.

## 2011-11-07 NOTE — Assessment & Plan Note (Signed)
Coronary disease seems stable at this time. No further workup.

## 2011-11-07 NOTE — Assessment & Plan Note (Signed)
Patient has statin intolerance. 

## 2011-11-07 NOTE — Assessment & Plan Note (Signed)
The patient has many reasons to be depressed. I suspect this is playing a role somewhat.

## 2011-11-07 NOTE — Patient Instructions (Addendum)
Your physician recommends that you schedule a follow-up appointment in: 6-8 weeks.  Your physician has recommended you make the following change in your medication: Decrease furosemide to 40 mg daily for 3 days,then 1 by mouth twice daily. Decrease ramipril to 5 mg daily. Hold isosorbide mononitrate 30 mg. Patient informed via phone. Please monitor signs and symptoms of swelling. Please call our office if this occurs.

## 2011-11-07 NOTE — Progress Notes (Signed)
HPI  Patient is seen today to followup CHF. She has felt poorly in general. Recently she was seen by her primary physician and sent to the emergency room. She was stable there. She received one dose of IV Lasix and she was allowed to go home. She called and asked to her appointment here be moved up and I made an adjustment in my schedule to see her. Today she has neck pain. She is also nauseated. She's not having chest pain or shortness of breath. Her overall volume status appears stable.  She has nausea but no vomiting. She does not have any abdominal tenderness. She's not having any diarrhea.   As part of today's evaluation I have reviewed her recent primary care notes. I have reviewed the note from her emergency room visit. I have reviewed the labs. When she was in the emergency room her BUN and creatinine were stable. Her potassium was stable. Her hemoglobin was stable.   Allergies  Allergen Reactions  . Atorvastatin Other (See Comments)    Unknown-patient admitted to hospital after taking  . Citrus Dermatitis    BREAK OUT ON BODY  . Iodine   . Iohexol      Desc: ANAPHYLAXIS   . Zolpidem Tartrate     REACTION: hallucinations  . Milk-Related Compounds Rash    Current Outpatient Prescriptions  Medication Sig Dispense Refill  . acetaminophen (NON-ASPIRIN) 325 MG tablet Take 650 mg by mouth every 6 (six) hours as needed. For pain      . aspirin 325 MG tablet Take 325 mg by mouth daily.       . carvedilol (COREG) 25 MG tablet Take 25 mg by mouth 2 (two) times daily with a meal.      . fluticasone (FLONASE) 50 MCG/ACT nasal spray Place 1 spray into the nose daily as needed. For allergies      . folic acid (FOLVITE) 1 MG tablet Take 1 mg by mouth daily.      . furosemide (LASIX) 40 MG tablet Take 40-80 mg by mouth 2 (two) times daily. Take 2 tablets by mouth in the morning and 1 tablet by mouth in the evening.      . gabapentin (NEURONTIN) 300 MG capsule Take 1 capsule (300 mg total)  by mouth 2 (two) times daily.  180 capsule  2  . glipiZIDE (GLUCOTROL) 10 MG tablet Take 1 tablet (10 mg total) by mouth daily.  30 tablet  3  . hydrOXYzine (ATARAX) 25 MG tablet Take 25 mg by mouth 2 (two) times daily.       . insulin glargine (LANTUS) 100 UNIT/ML injection Inject 30 Units into the skin at bedtime.      . insulin lispro (HUMALOG) 100 UNIT/ML injection Inject 2-12 Units into the skin 4 (four) times daily as needed. Sliding scale      . isosorbide mononitrate (IMDUR) 30 MG 24 hr tablet Take 30 mg by mouth daily.      [name] levothyroxine (SYNTHROID, LEVOTHROID) 112 MCG tablet Take 56 mcg by mouth daily.       . meclizine (ANTIVERT) 25 MG tablet Take 25 mg by mouth 3 (three) times daily as needed. For dizziness      . Meclizine HCl (BONINE PO) Take 1 tablet by mouth daily as needed. Takes either Bonine or Meclizine for vertigo.      . metFORMIN (GLUCOPHAGE) 500 MG tablet Take 1,000 mg by mouth 2 (two) times daily with a meal.      .  nitroGLYCERIN (NITROSTAT) 0.4 MG SL tablet Place 0.4 mg under the tongue every 5 (five) minutes as needed. May repeat times three.       . promethazine (PHENERGAN) 25 MG tablet Take 25 mg by mouth every 8 (eight) hours as needed. For nausea      . ramipril (ALTACE) 10 MG capsule Take 10 mg by mouth daily.      [name] ZYRTEC 10 MG tablet TAKE ONE TABLET DAILY.  30 each  4  . DISCONTD: insulin glargine (LANTUS) 100 UNIT/ML injection Inject 24 Units into the skin at bedtime.  10 mL  3  . DISCONTD: insulin lispro (HUMALOG) 100 UNIT/ML injection Inject 2-10 Units into the skin 4 (four) times daily as needed. Sliding scale  10 mL  3  . ciprofloxacin (CIPRO) 500 MG tablet         History   Social History  . Marital Status: Divorced    Spouse Name: N/A    Number of Children: N/A  . Years of Education: N/A   Occupational History  . Retired    Social History Main Topics  . Smoking status: Never Smoker   . Smokeless tobacco: Never Used  . Alcohol Use: No  .  Drug Use: No  . Sexually Active: Not on file   Other Topics Concern  . Not on file   Social History Narrative  . No narrative on file    Family History  Problem Relation Age of Onset  . Heart disease Other   . Heart disease Mother   . Hypertension Mother   . Heart disease Father   . Hypertension Father     Past Medical History  Diagnosis Date  . Hypertension     Unspecified  . Hyperlipidemia     Mixed  . Coronary artery disease 08/21/09    Catheterization 2006 occluded OM vein graft  /   nuclear 2008 question slight ischemia  . S/P CABG (coronary artery bypass graft) 2003     2003  LIMA, LAD, SVG to the OM, SVG right coronary artery  . Aneurysm 2003    Ascending thoracic aneurysm repair with a graft at time of CABG  . Diabetes mellitus     Insulin dependent  . Gastroparesis   . Depression   . Allergy history, radiographic dye     Contrast dye allergy  . Ejection fraction     EF 50%  //  Ejection fraction 25-30% at the time of stress echo,  May, 2013  . Previous back surgery   . Syncope 2008    Question?  2008  . Statin intolerance   . Shortness of breath     Exertional shortness of breath, May, 2013  . Systolic CHF     June, 2013  . Cardiomyopathy     June, 2013  . Thyroid disease   . CHF (congestive heart failure)     Past Surgical History  Procedure Date  . Coronary artery bypass graft 2003     LIMA, LAD, SVG to the OM, SVG right coronary artery  . Bladder tacking   . Salivary gland surgery   . Total abdominal hysterectomy   . Back surgery   . Breast surgery   . External ear surgery     Patient Active Problem List  Diagnosis  . Hypertension  . Hyperlipidemia  . Coronary artery disease  . S/P CABG (coronary artery bypass graft)  . Aneurysm  . Gastroparesis  . Depression  . Allergy  history, radiographic dye  . Syncope  . Statin intolerance  . Shortness of breath  . Ejection fraction  . Cardiomyopathy  . Type II or unspecified type diabetes  mellitus without mention of complication, not stated as uncontrolled  . Vertigo  . Back pain  . Diabetic neuropathy  . UTI (lower urinary tract infection)  . Systolic CHF  . Neck pain    ROS  Patient denies fever, chills, headache, sweats, rash, change in vision, change in hearing, shortness of breath, cough, chest pain, urinary symptoms. All other systems are reviewed and are negative other than the history of present illness.  PHYSICAL EXAM  The patient is uncomfortable in her wheelchair. She is in no acute distress. She says that her neck hurts. She says that she has nausea. She is oriented to person time and place. Affect is normal. She is here with either a friend or family member. There is no jugular venous distention. Her lungs are clear. Respiratory effort is nonlabored. Cardiac exam reveals S1 and S2. There is a soft systolic murmur. Her abdomen is soft. Bowel sounds are normal. There is no tenderness. She has no significant peripheral edema. There are no musculoskeletal deformities. There are no skin rashes.  Filed Vitals:   11/07/11 1444  BP: 88/58  Pulse: 97  Height: 5\' 5"  (1.651 m)  Weight: 196 lb (88.905 kg)  SpO2: 98%     ASSESSMENT & PLAN

## 2011-11-07 NOTE — Assessment & Plan Note (Signed)
I feel that the patient's cardiac status is not causing her nausea. I suspect this is probably gastroparesis from her diabetes.

## 2011-11-07 NOTE — Assessment & Plan Note (Signed)
Patient has significant cardiomyopathy. She has been on appropriate medications. However her blood pressure is low today and we will back off on some of these.

## 2011-11-07 NOTE — Assessment & Plan Note (Signed)
The patient is not fluid overloaded today. She may be on the dry side. For a few days we will cut her diuretic dose back.

## 2011-11-07 NOTE — Assessment & Plan Note (Signed)
She has continued neck pain. This is chronic. It is probably related to disc disease.

## 2011-11-10 ENCOUNTER — Telehealth: Payer: Self-pay | Admitting: *Deleted

## 2011-11-10 NOTE — Telephone Encounter (Signed)
Left message for patient to call office to see how she is doing. 

## 2011-11-10 NOTE — Telephone Encounter (Signed)
Patient is doing much better. Patient given instructions again r/e medications changes.

## 2011-11-13 ENCOUNTER — Telehealth: Payer: Self-pay | Admitting: Family Medicine

## 2011-11-13 NOTE — Telephone Encounter (Signed)
Will call and notify patient that no samples are available.

## 2011-11-17 NOTE — Telephone Encounter (Signed)
Patient aware that no samples aware.

## 2011-12-04 ENCOUNTER — Ambulatory Visit (INDEPENDENT_AMBULATORY_CARE_PROVIDER_SITE_OTHER): Payer: Medicare Other | Admitting: Family Medicine

## 2011-12-04 ENCOUNTER — Encounter: Payer: Self-pay | Admitting: Family Medicine

## 2011-12-04 VITALS — BP 118/60 | HR 93 | Resp 15 | Ht 65.0 in | Wt 186.0 lb

## 2011-12-04 DIAGNOSIS — M542 Cervicalgia: Secondary | ICD-10-CM

## 2011-12-04 DIAGNOSIS — E119 Type 2 diabetes mellitus without complications: Secondary | ICD-10-CM

## 2011-12-04 DIAGNOSIS — I10 Essential (primary) hypertension: Secondary | ICD-10-CM

## 2011-12-04 DIAGNOSIS — M549 Dorsalgia, unspecified: Secondary | ICD-10-CM

## 2011-12-04 MED ORDER — HYDROCODONE-ACETAMINOPHEN 5-500 MG PO TABS: 1.0000 | ORAL_TABLET | Freq: Every evening | ORAL | Status: DC | PRN

## 2011-12-04 NOTE — Assessment & Plan Note (Signed)
Check A1C and lytes today, increase Lantus to 40 units, continue oral meds and continue short acting insulin

## 2011-12-04 NOTE — Assessment & Plan Note (Signed)
Per above low dose vicodin

## 2011-12-04 NOTE — Patient Instructions (Addendum)
Lantus 40 units  Pain pill given as needed  Try to eat at least 3 times a day Get the A1C done  F/U 3 months

## 2011-12-04 NOTE — Assessment & Plan Note (Signed)
Start low dose vicodin 1 tablet at bedtime

## 2011-12-04 NOTE — Assessment & Plan Note (Signed)
BP looks good even on decreased dose of meds

## 2011-12-04 NOTE — Progress Notes (Signed)
  Subjective:    Patient ID: Cathy [name], female    DOB: May 27, 1930, 78 y.o.   MRN: [id number]  HPI Patient here to follow chronic medical problems. She was seen by her cardiologist at the last visit for shortness of breath and CHF. She has been taking her Lasix at a reduced dose per her cardiology note. Her blood pressure was on the lower and therefore some medications were changed. Her blood sugars have been running very high from the 250s to 350s. She's taking Lantus 30 units as well as her metformin and glipizide and her sliding scale insulin with meals. She is asking for something for her arthritis pain she states she is unable to move around and she has severe neck pain she was on pain medication in the past.   Review of Systems   GEN- denies fatigue, fever, weight loss,weakness, recent illness HEENT- denies eye drainage, change in vision, nasal discharge, CVS- denies chest pain, palpitations RESP- denies SOB, cough, wheeze ABD- denies N/V, change in stools, abd pain GU- denies dysuria, hematuria, dribbling, incontinence MSK-+ joint pain, muscle aches, injury Neuro- denies headache, dizziness, syncope, seizure activity      Objective:   Physical Exam  GEN- NAD, alert and oriented x3, sitting in wheelchair, weight down 10lbs HEENT- PERRL, EOMI, non injected sclera, pink conjunctiva, oropharynx clear Neck- Supple, decreased ROM CVS- RRR, no murmur RESP-mild basilar crackle, no rhonchi, WOB at baseline, no wheezing ABD-NABS,soft,NT,ND EXT- No edema, venous stasis changes Pulses- Radial 2+, DP 2+       Assessment & Plan:

## 2011-12-05 LAB — BASIC METABOLIC PANEL
BUN: 23 mg/dL (ref 6–23)
Calcium: 10.1 mg/dL (ref 8.4–10.5)
Chloride: 96 mEq/L (ref 96–112)
Creat: 1.02 mg/dL (ref 0.50–1.10)

## 2011-12-05 LAB — HEMOGLOBIN A1C
Hgb A1c MFr Bld: 8.6 % — ABNORMAL HIGH (ref <5.7)
Mean Plasma Glucose: 200 mg/dL — ABNORMAL HIGH (ref <117)

## 2011-12-09 ENCOUNTER — Other Ambulatory Visit: Payer: Self-pay | Admitting: *Deleted

## 2011-12-09 MED ORDER — CARVEDILOL 25 MG PO TABS: 25.0000 mg | ORAL_TABLET | Freq: Two times a day (BID) | ORAL | Status: DC

## 2011-12-27 ENCOUNTER — Other Ambulatory Visit: Payer: Self-pay | Admitting: Family Medicine

## 2012-01-06 ENCOUNTER — Encounter: Payer: Self-pay | Admitting: Cardiology

## 2012-01-06 ENCOUNTER — Ambulatory Visit (INDEPENDENT_AMBULATORY_CARE_PROVIDER_SITE_OTHER): Payer: Medicare Other | Admitting: Cardiology

## 2012-01-06 VITALS — BP 102/61 | HR 76 | Ht 65.0 in | Wt 186.0 lb

## 2012-01-06 DIAGNOSIS — I428 Other cardiomyopathies: Secondary | ICD-10-CM

## 2012-01-06 DIAGNOSIS — I959 Hypotension, unspecified: Secondary | ICD-10-CM

## 2012-01-06 DIAGNOSIS — I10 Essential (primary) hypertension: Secondary | ICD-10-CM

## 2012-01-06 DIAGNOSIS — K3184 Gastroparesis: Secondary | ICD-10-CM

## 2012-01-06 DIAGNOSIS — I429 Cardiomyopathy, unspecified: Secondary | ICD-10-CM

## 2012-01-06 NOTE — Assessment & Plan Note (Signed)
Discuss cause her to have ongoing nausea. No further workup.

## 2012-01-06 NOTE — Assessment & Plan Note (Signed)
Patient is on appropriate medications. Her volume status is stable. No change in therapy.

## 2012-01-06 NOTE — Assessment & Plan Note (Signed)
Blood pressure is controlled. No change in therapy. 

## 2012-01-06 NOTE — Assessment & Plan Note (Signed)
When I saw her last November 07, 2011 her blood pressure was on the low side. I cut the doses of some of her meds. Her blood pressure stable and she's feeling better. No further changes today.

## 2012-01-06 NOTE — Patient Instructions (Addendum)
Your physician recommends that you schedule a follow-up appointment in: 4 months. You will receive a reminder letter in the mail in about 1-2 months reminding you to call and schedule your appointment. If you don't receive this letter, please contact our office. Your physician recommends that you continue on your current medications as directed. Please refer to the Current Medication list given to you today. 

## 2012-01-06 NOTE — Progress Notes (Signed)
HPI  Patient is seen in followup CHF. She is smiling today.  This is the first time in a long time. She says she is fatigued. However she's not having chest pain or shortness of breath. She's here in a wheelchair. She's not having any major palpitations.  Allergies  Allergen Reactions  . Atorvastatin Other (See Comments)    Unknown-patient admitted to hospital after taking  . Citrus Dermatitis    BREAK OUT ON BODY  . Iodine   . Iohexol      Desc: ANAPHYLAXIS   . Zolpidem Tartrate     REACTION: hallucinations  . Milk-Related Compounds Rash    Current Outpatient Prescriptions  Medication Sig Dispense Refill  . acetaminophen (NON-ASPIRIN) 325 MG tablet Take 650 mg by mouth every 6 (six) hours as needed. For pain      . aspirin 325 MG tablet Take 325 mg by mouth daily.       . carvedilol (COREG) 25 MG tablet Take 1 tablet (25 mg total) by mouth 2 (two) times daily with a meal.  60 tablet  5  . fluticasone (FLONASE) 50 MCG/ACT nasal spray Place 1 spray into the nose daily as needed. For allergies      . folic acid (FOLVITE) 1 MG tablet Take 1 mg by mouth daily.      . furosemide (LASIX) 40 MG tablet Take 1 tablets by mouth in the morning and 1 tablet by mouth in the evening.      . gabapentin (NEURONTIN) 300 MG capsule Take 1 capsule (300 mg total) by mouth 2 (two) times daily.  180 capsule  2  . glipiZIDE (GLUCOTROL) 10 MG tablet Take 1 tablet (10 mg total) by mouth daily.  30 tablet  3  . HYDROcodone-acetaminophen (VICODIN) 5-500 MG per tablet Take 1 tablet by mouth at bedtime as needed for pain.  30 tablet  2  . hydrOXYzine (ATARAX) 25 MG tablet Take 25 mg by mouth 2 (two) times daily.       . insulin glargine (LANTUS) 100 UNIT/ML injection       . insulin lispro (HUMALOG) 100 UNIT/ML injection Inject 2-12 Units into the skin 4 (four) times daily as needed. Sliding scale      . isosorbide mononitrate (IMDUR) 30 MG 24 hr tablet Take 30 mg by mouth daily.      [name] levothyroxine  (SYNTHROID, LEVOTHROID) 112 MCG tablet Take 56 mcg by mouth daily.       . meclizine (ANTIVERT) 25 MG tablet Take 25 mg by mouth 3 (three) times daily as needed. For dizziness      . metFORMIN (GLUCOPHAGE) 500 MG tablet Take 1,000 mg by mouth 2 (two) times daily with a meal.      . nitroGLYCERIN (NITROSTAT) 0.4 MG SL tablet Place 0.4 mg under the tongue every 5 (five) minutes as needed. May repeat times three.       . promethazine (PHENERGAN) 25 MG tablet Take 25 mg by mouth every 8 (eight) hours as needed. For nausea      . ramipril (ALTACE) 5 MG capsule Take 1 capsule (5 mg total) by mouth daily.  30 capsule  2  . ZYRTEC 10 MG tablet TAKE ONE TABLET DAILY.  30 each  4    History   Social History  . Marital Status: Divorced    Spouse Name: N/A    Number of Children: N/A  . Years of Education: N/A   Occupational History  .  Retired    Social History Main Topics  . Smoking status: Never Smoker   . Smokeless tobacco: Never Used  . Alcohol Use: No  . Drug Use: No  . Sexually Active: Not on file   Other Topics Concern  . Not on file   Social History Narrative  . No narrative on file    Family History  Problem Relation Age of Onset  . Heart disease Other   . Heart disease Mother   . Hypertension Mother   . Heart disease Father   . Hypertension Father     Past Medical History  Diagnosis Date  . Hypertension     Unspecified  . Hyperlipidemia     Mixed  . Coronary artery disease 08/21/09    Catheterization 2006 occluded OM vein graft  /   nuclear 2008 question slight ischemia  . S/P CABG (coronary artery bypass graft) 2003     2003  LIMA, LAD, SVG to the OM, SVG right coronary artery  . Aneurysm 2003    Ascending thoracic aneurysm repair with a graft at time of CABG  . Diabetes mellitus     Insulin dependent  . Gastroparesis   . Depression   . Allergy history, radiographic dye     Contrast dye allergy  . Ejection fraction     EF 50%  //  Ejection fraction 25-30% at  the time of stress echo,  May, 2013  . Previous back surgery   . Syncope 2008    Question?  2008  . Statin intolerance   . Shortness of breath     Exertional shortness of breath, May, 2013  . Systolic CHF     June, 2013  . Cardiomyopathy     June, 2013  . Thyroid disease   . CHF (congestive heart failure)   . Nausea     October, 2013  . Hypotension     October, 2013    Past Surgical History  Procedure Date  . Coronary artery bypass graft 2003     LIMA, LAD, SVG to the OM, SVG right coronary artery  . Bladder tacking   . Salivary gland surgery   . Total abdominal hysterectomy   . Back surgery   . Breast surgery   . External ear surgery     Patient Active Problem List  Diagnosis  . Hypertension  . Hyperlipidemia  . Coronary artery disease  . S/P CABG (coronary artery bypass graft)  . Aneurysm  . Gastroparesis  . Depression  . Allergy history, radiographic dye  . Syncope  . Statin intolerance  . Shortness of breath  . Ejection fraction  . Cardiomyopathy  . Type II or unspecified type diabetes mellitus without mention of complication, not stated as uncontrolled  . Vertigo  . Back pain  . Diabetic neuropathy  . Systolic CHF  . Neck pain  . Nausea  . Hypotension    ROS   Patient denies fever, chills, headache, sweats, rash, change in vision, change in hearing, chest pain, cough,, urinary symptoms. All other systems are reviewed and are negative. She does have nausea. I explained that this is related to her gastroparesis from her diabetes.  PHYSICAL EXAM  Patient is in a wheelchair. She is oriented to person time and place. Affect is normal. There is no jugulovenous distention. Lungs are clear. Respiratory effort is nonlabored. Cardiac exam reveals S1-S2. There no clicks or significant murmurs. The abdomen is soft. There is no peripheral edema.  Filed  Vitals:   01/06/12 1427  BP: 102/61  Pulse: 76  Height: 5\' 5"  (1.651 m)  Weight: 186 lb (84.369 kg)      ASSESSMENT & PLAN

## 2012-01-07 ENCOUNTER — Other Ambulatory Visit: Payer: Self-pay | Admitting: Family Medicine

## 2012-01-23 ENCOUNTER — Telehealth: Payer: Self-pay | Admitting: Family Medicine

## 2012-01-23 NOTE — Telephone Encounter (Signed)
Please call and triage and see how much insulin she is taking

## 2012-01-23 NOTE — Telephone Encounter (Signed)
Patient states that she is taking Humalog 2 times a day because she usually does not eat breakfast. She is taking 40 units of Lantus.  She also states that she is taking her Glipizide and Metformin as prescribed.   No changes in medications.   No signs of infection.  Does report clear sinus drainage.

## 2012-01-23 NOTE — Telephone Encounter (Signed)
Increase lantus to 50 units She needs to get up and eat breakfast and get the humalog in her system in the morning, this will help her sugars come down

## 2012-01-23 NOTE — Telephone Encounter (Signed)
Patient aware.

## 2012-01-28 ENCOUNTER — Other Ambulatory Visit: Payer: Self-pay | Admitting: Family Medicine

## 2012-01-28 ENCOUNTER — Ambulatory Visit: Payer: Medicare Other | Admitting: Cardiology

## 2012-02-16 ENCOUNTER — Other Ambulatory Visit: Payer: Self-pay | Admitting: Family Medicine

## 2012-02-18 ENCOUNTER — Other Ambulatory Visit: Payer: Self-pay | Admitting: Family Medicine

## 2012-02-23 ENCOUNTER — Other Ambulatory Visit: Payer: Self-pay | Admitting: Family Medicine

## 2012-03-01 ENCOUNTER — Other Ambulatory Visit: Payer: Self-pay | Admitting: Family Medicine

## 2012-03-01 ENCOUNTER — Telehealth: Payer: Self-pay | Admitting: Family Medicine

## 2012-03-01 NOTE — Telephone Encounter (Signed)
Please call patient and see what her sugars are doing, it is okay to stop the gabapentin if she feels dizzy after taking, but if her sugars are high this will also cause the change in vision and dizziness. She should be on Lantus 50units and Humalog

## 2012-03-01 NOTE — Telephone Encounter (Signed)
States she is not going to take anymore and she has appt on the 20th. Not coming out in the weather

## 2012-03-02 NOTE — Telephone Encounter (Signed)
Agree, blood sugars look good

## 2012-03-02 NOTE — Telephone Encounter (Signed)
Spoke with patient and she stated that her blood sugars yesterday were 152 and 172.  She stated that she took 3 Humalog shots and 1 Lantus shot on yesterday and those were the best readings that she has had in a while.  She states that her blood sugar usually stays in the 200s.  She has stopped taking Gabapentin and would like to know what can be given to replace it.  Advised patient that changes would have to be made at upcoming appointment on 2/20.

## 2012-03-05 ENCOUNTER — Ambulatory Visit: Payer: Medicare Other | Admitting: Family Medicine

## 2012-03-09 ENCOUNTER — Other Ambulatory Visit: Payer: Self-pay

## 2012-03-09 MED ORDER — RAMIPRIL 5 MG PO CAPS: 5.0000 mg | ORAL_CAPSULE | Freq: Every day | ORAL | Status: DC

## 2012-03-10 ENCOUNTER — Other Ambulatory Visit: Payer: Self-pay | Admitting: Family Medicine

## 2012-03-11 ENCOUNTER — Encounter: Payer: Self-pay | Admitting: Family Medicine

## 2012-03-11 ENCOUNTER — Ambulatory Visit (INDEPENDENT_AMBULATORY_CARE_PROVIDER_SITE_OTHER): Payer: Medicare Other | Admitting: Family Medicine

## 2012-03-11 VITALS — BP 108/64 | HR 90 | Resp 16 | Ht 65.0 in | Wt 189.0 lb

## 2012-03-11 DIAGNOSIS — E785 Hyperlipidemia, unspecified: Secondary | ICD-10-CM

## 2012-03-11 DIAGNOSIS — H539 Unspecified visual disturbance: Secondary | ICD-10-CM

## 2012-03-11 DIAGNOSIS — E1140 Type 2 diabetes mellitus with diabetic neuropathy, unspecified: Secondary | ICD-10-CM

## 2012-03-11 DIAGNOSIS — E1142 Type 2 diabetes mellitus with diabetic polyneuropathy: Secondary | ICD-10-CM

## 2012-03-11 DIAGNOSIS — Z23 Encounter for immunization: Secondary | ICD-10-CM

## 2012-03-11 DIAGNOSIS — E119 Type 2 diabetes mellitus without complications: Secondary | ICD-10-CM

## 2012-03-11 DIAGNOSIS — I959 Hypotension, unspecified: Secondary | ICD-10-CM

## 2012-03-11 DIAGNOSIS — R5381 Other malaise: Secondary | ICD-10-CM

## 2012-03-11 DIAGNOSIS — R5383 Other fatigue: Secondary | ICD-10-CM

## 2012-03-11 DIAGNOSIS — K3184 Gastroparesis: Secondary | ICD-10-CM

## 2012-03-11 DIAGNOSIS — R599 Enlarged lymph nodes, unspecified: Secondary | ICD-10-CM

## 2012-03-11 DIAGNOSIS — R42 Dizziness and giddiness: Secondary | ICD-10-CM

## 2012-03-11 DIAGNOSIS — R591 Generalized enlarged lymph nodes: Secondary | ICD-10-CM

## 2012-03-11 DIAGNOSIS — I10 Essential (primary) hypertension: Secondary | ICD-10-CM

## 2012-03-11 DIAGNOSIS — E1149 Type 2 diabetes mellitus with other diabetic neurological complication: Secondary | ICD-10-CM

## 2012-03-11 MED ORDER — RAMIPRIL 5 MG PO CAPS: 5.0000 mg | ORAL_CAPSULE | Freq: Every day | ORAL | Status: DC

## 2012-03-11 MED ORDER — AMOXICILLIN 500 MG PO TABS: 500.0000 mg | ORAL_TABLET | Freq: Two times a day (BID) | ORAL | Status: DC

## 2012-03-11 NOTE — Progress Notes (Signed)
  Subjective:    Patient ID: Cathy [name], female    DOB: September 13, 1930, 79 y.o.   MRN: [id number]  HPI  Patient is here follow chronic medical problems. She's complaining of dizziness and blurred vision for the past month she stopped her gabapentin about 3 weeks ago and is only improved slightly. She's been checking her blood pressures at home they have ranged from 110 to 150s over 80-90 this is typically before she takes her Coreg or Ramipril. Of note she has not had her Ramipril this past week. She denies any seizure activity or syncope. Her blood sugars have been very elevated ranging from 150 to high 300s she's not had any hypoglycemia she's using her sliding scale for her Humalog and is given 3 injections a day she's also taking Lantus 50 units. She also noticed a knot behind her ear which is been very tender for the past 2 weeks this occurred after some congestion.   Review of Systems - per above  GEN- denies fatigue, fever, weight loss,weakness, recent illness HEENT- denies eye drainage, +change in vision, nasal discharge, CVS- denies chest pain, palpitations RESP- denies SOB, +cough, wheeze ABD- denies N/V, change in stools, abd pain GU- denies dysuria, hematuria, dribbling, incontinence MSK- denies joint pain, muscle aches, injury Neuro- denies headache, +dizziness, syncope, seizure activity      Objective:   Physical Exam GEN- NAD, alert and oriented x3, sitting in wheelchair, fatigued appearing  Sitting 108/64, standing 102/60 HEENT- PERRL, EOMI, non injected sclera, pink conjunctiva, oropharynx clear Neck- Supple,   1cm node posterior Left auricular TTP, non fluctuant  CVS- RRR, no murmur RESP-CTAB decreased bases ABD-NABS,soft,NT,ND EXT- No edema, venous stasis changes Pulses- Radial 2+ Neuro- CNII-XII in tact , no focal defcits        Assessment & Plan:

## 2012-03-11 NOTE — Patient Instructions (Addendum)
Increase lantus to 60 units Continue sliding scale insulin Take 1/2 tablet of coreg twice a day  Continue the ramipril  Stop the glipizide Stop the gabapentin Repeat brain scan at [facility] to be done - fasting  Do not take the atarax right now  I will call about the phenergan and the ramipril  Take the anti-biotic for the lymph node as directed F/U 4 weeks

## 2012-03-12 ENCOUNTER — Other Ambulatory Visit: Payer: Self-pay | Admitting: Family Medicine

## 2012-03-12 DIAGNOSIS — R591 Generalized enlarged lymph nodes: Secondary | ICD-10-CM | POA: Insufficient documentation

## 2012-03-12 DIAGNOSIS — R42 Dizziness and giddiness: Secondary | ICD-10-CM | POA: Insufficient documentation

## 2012-03-12 NOTE — Assessment & Plan Note (Signed)
Multifactorial , she has multiple medications at the because her symptoms she did go ahead and stop the gabapentin we will also stop her hydroxyzine for right now. Her diabetes is uncontrolled her blood pressure is on the lower and she has no signs of infection today. I will also go ahead and check an MRI of the brain and she has multiple risk factors for stroke

## 2012-03-12 NOTE — Assessment & Plan Note (Signed)
She had a recent upper respiratory infection I will go ahead and put on antibiotics for this tender node that we feel I will recheck this in 4 weeks on her return visit

## 2012-03-12 NOTE — Assessment & Plan Note (Signed)
The patient's history of gastroparesis likely related to her diabetes mellitus she uses Phenergan as needed for nausea we'll have to get prior approval on this medication for her use

## 2012-03-12 NOTE — Assessment & Plan Note (Signed)
We will hold on any additional meds at this time

## 2012-03-12 NOTE — Assessment & Plan Note (Signed)
Her blood pressure is on the hypotensive side is difficult to pain out all of her symptoms with her dizziness headache and change in vision in the setting of her uncontrolled diabetes and her other risk factors. I will decrease her Coreg to 12.5 mg twice a day to see how she does with this she does check her blood pressure at home. We will continue her on the low-dose ACE inhibitor because of her heart failure

## 2012-03-12 NOTE — Assessment & Plan Note (Signed)
Uncontrolled state with her diabetes mellitus initially she was on Lantus 90 units twice a day along with the sliding scale insulin and still had elevated sugars I try putting her on oral medications at her request because she cannot afford insulin however these are not helping. I'll obtain another A1c today she's to increase the Lantus to 50 units and continue the sliding scale insulin we will go ahead and stop the glipizide I will check her renal function we may need to stop the metformin as well

## 2012-03-15 ENCOUNTER — Telehealth: Payer: Self-pay | Admitting: Family Medicine

## 2012-03-15 LAB — CBC WITH DIFFERENTIAL/PLATELET
Basophils Relative: 0 % (ref 0–1)
Eosinophils Absolute: 0.1 10*3/uL (ref 0.0–0.7)
HCT: 42.7 % (ref 36.0–46.0)
Hemoglobin: 14.4 g/dL (ref 12.0–15.0)
Lymphs Abs: 4.1 10*3/uL — ABNORMAL HIGH (ref 0.7–4.0)
MCH: 29.4 pg (ref 26.0–34.0)
Monocytes Relative: 8 % (ref 3–12)
Neutro Abs: 4.7 10*3/uL (ref 1.7–7.7)
Neutrophils Relative %: 49 % (ref 43–77)
Platelets: 255 10*3/uL (ref 150–400)
RBC: 4.89 MIL/uL (ref 3.87–5.11)

## 2012-03-15 LAB — HEMOGLOBIN A1C
Hgb A1c MFr Bld: 8.7 % — ABNORMAL HIGH (ref <5.7)
Mean Plasma Glucose: 203 mg/dL — ABNORMAL HIGH (ref <117)

## 2012-03-15 LAB — ESTIMATED GFR
GFR, Est African American: 72 mL/min
GFR, Est Non African American: 63 mL/min

## 2012-03-15 LAB — LIPID PANEL

## 2012-03-15 LAB — BASIC METABOLIC PANEL
CO2: 25 mEq/L (ref 19–32)
Calcium: 10.1 mg/dL (ref 8.4–10.5)
Sodium: 138 mEq/L (ref 135–145)

## 2012-03-15 LAB — TSH: TSH: 0.391 u[IU]/mL (ref 0.350–4.500)

## 2012-03-16 ENCOUNTER — Telehealth: Payer: Self-pay | Admitting: Family Medicine

## 2012-03-16 NOTE — Telephone Encounter (Signed)
Faxed back this am 

## 2012-03-19 ENCOUNTER — Telehealth: Payer: Self-pay | Admitting: Family Medicine

## 2012-03-19 DIAGNOSIS — H539 Unspecified visual disturbance: Secondary | ICD-10-CM

## 2012-03-19 DIAGNOSIS — R42 Dizziness and giddiness: Secondary | ICD-10-CM

## 2012-03-19 MED ORDER — OMEGA-3-ACID ETHYL ESTERS 1 G PO CAPS: 2.0000 g | ORAL_CAPSULE | Freq: Two times a day (BID) | ORAL | Status: DC

## 2012-03-19 NOTE — Telephone Encounter (Signed)
MRI changed, per [employer] to with and without

## 2012-03-19 NOTE — Addendum Note (Signed)
Addended by: Milinda Antis F on: 03/19/2012 11:19 AM   Modules accepted: Orders

## 2012-03-19 NOTE — Telephone Encounter (Signed)
Pt has been called and left a message about appt and time.

## 2012-03-19 NOTE — Telephone Encounter (Signed)
New order placed

## 2012-03-23 ENCOUNTER — Other Ambulatory Visit (HOSPITAL_COMMUNITY): Payer: Medicare Other

## 2012-03-23 ENCOUNTER — Other Ambulatory Visit: Payer: Self-pay | Admitting: Family Medicine

## 2012-03-23 NOTE — Telephone Encounter (Signed)
Dr Jeanice Lim has spoke with patient

## 2012-03-29 ENCOUNTER — Encounter (HOSPITAL_COMMUNITY): Payer: Self-pay

## 2012-03-29 ENCOUNTER — Ambulatory Visit (HOSPITAL_COMMUNITY)
Admission: RE | Admit: 2012-03-29 | Discharge: 2012-03-29 | Disposition: A | Discharge status: Home / Self Care | Payer: Medicare Other | Source: Ambulatory Visit | Attending: Family Medicine | Admitting: Family Medicine

## 2012-03-29 DIAGNOSIS — R51 Headache: Secondary | ICD-10-CM | POA: Insufficient documentation

## 2012-03-29 DIAGNOSIS — E785 Hyperlipidemia, unspecified: Secondary | ICD-10-CM | POA: Insufficient documentation

## 2012-03-29 DIAGNOSIS — G319 Degenerative disease of nervous system, unspecified: Secondary | ICD-10-CM | POA: Insufficient documentation

## 2012-03-29 DIAGNOSIS — I10 Essential (primary) hypertension: Secondary | ICD-10-CM | POA: Insufficient documentation

## 2012-03-29 DIAGNOSIS — R930 Abnormal findings on diagnostic imaging of skull and head, not elsewhere classified: Secondary | ICD-10-CM | POA: Insufficient documentation

## 2012-03-29 DIAGNOSIS — H538 Other visual disturbances: Secondary | ICD-10-CM | POA: Insufficient documentation

## 2012-03-29 DIAGNOSIS — E119 Type 2 diabetes mellitus without complications: Secondary | ICD-10-CM | POA: Insufficient documentation

## 2012-03-29 MED ORDER — GADOBENATE DIMEGLUMINE 529 MG/ML IV SOLN
15.0000 mL | Freq: Once | INTRAVENOUS | Status: AC | PRN
  Administered 2012-03-29: 15 mL via INTRAVENOUS

## 2012-04-01 ENCOUNTER — Telehealth: Payer: Self-pay | Admitting: Cardiology

## 2012-04-01 NOTE — Telephone Encounter (Signed)
Patient called wanting to move appointment to later in the day.  Advised patient last appt for Dr. Myrtis Ser is 145pm and next available is in June.    During this conversation, patient went through a list of items as issues, but could not come in earlier as offered.  Patients complaints ranged from scraping her big toe, Carvedilol-patient states that Dr. Jeanice Lim told her to cut in half and take half in am and half in pm, brain scan that was done and retirement of another physician.  Patient was advised to keep appt with Dr. Myrtis Ser and Dr. Jeanice Lim.  Pt advised scan was ordered by Dr. Jeanice Lim and told her she would need to speak with them about this.  Patient did advise that she is allergic to dye.  Patient did not have any "cardiac related" concerns at this time.

## 2012-04-05 ENCOUNTER — Encounter: Payer: Self-pay | Admitting: Family Medicine

## 2012-04-15 ENCOUNTER — Encounter: Payer: Self-pay | Admitting: Family Medicine

## 2012-04-15 ENCOUNTER — Inpatient Hospital Stay (HOSPITAL_COMMUNITY)
Admission: EM | Admit: 2012-04-15 | Discharge: 2012-04-21 | DRG: 251 | Disposition: A | Discharge status: Skilled Nursing Facility | Payer: Medicare Other | Source: Ambulatory Visit | Attending: Cardiology | Admitting: Cardiology

## 2012-04-15 ENCOUNTER — Ambulatory Visit (INDEPENDENT_AMBULATORY_CARE_PROVIDER_SITE_OTHER): Payer: Medicare Other | Admitting: Family Medicine

## 2012-04-15 ENCOUNTER — Encounter (HOSPITAL_COMMUNITY)
Admission: EM | Disposition: A | Discharge status: Skilled Nursing Facility | Payer: Self-pay | Source: Ambulatory Visit | Attending: Cardiology

## 2012-04-15 VITALS — BP 104/66 | HR 84 | Resp 18 | Ht 65.0 in | Wt 190.0 lb

## 2012-04-15 DIAGNOSIS — Z79899 Other long term (current) drug therapy: Secondary | ICD-10-CM

## 2012-04-15 DIAGNOSIS — I255 Ischemic cardiomyopathy: Secondary | ICD-10-CM

## 2012-04-15 DIAGNOSIS — Z7902 Long term (current) use of antithrombotics/antiplatelets: Secondary | ICD-10-CM

## 2012-04-15 DIAGNOSIS — Z91041 Radiographic dye allergy status: Secondary | ICD-10-CM

## 2012-04-15 DIAGNOSIS — N390 Urinary tract infection, site not specified: Secondary | ICD-10-CM | POA: Diagnosis not present

## 2012-04-15 DIAGNOSIS — I200 Unstable angina: Secondary | ICD-10-CM

## 2012-04-15 DIAGNOSIS — I10 Essential (primary) hypertension: Secondary | ICD-10-CM | POA: Diagnosis present

## 2012-04-15 DIAGNOSIS — R110 Nausea: Secondary | ICD-10-CM | POA: Diagnosis not present

## 2012-04-15 DIAGNOSIS — I2589 Other forms of chronic ischemic heart disease: Secondary | ICD-10-CM | POA: Diagnosis present

## 2012-04-15 DIAGNOSIS — Z951 Presence of aortocoronary bypass graft: Secondary | ICD-10-CM

## 2012-04-15 DIAGNOSIS — Z794 Long term (current) use of insulin: Secondary | ICD-10-CM

## 2012-04-15 DIAGNOSIS — K3184 Gastroparesis: Secondary | ICD-10-CM | POA: Diagnosis present

## 2012-04-15 DIAGNOSIS — Z7982 Long term (current) use of aspirin: Secondary | ICD-10-CM

## 2012-04-15 DIAGNOSIS — E1149 Type 2 diabetes mellitus with other diabetic neurological complication: Secondary | ICD-10-CM | POA: Diagnosis present

## 2012-04-15 DIAGNOSIS — E785 Hyperlipidemia, unspecified: Secondary | ICD-10-CM | POA: Diagnosis present

## 2012-04-15 DIAGNOSIS — Z8249 Family history of ischemic heart disease and other diseases of the circulatory system: Secondary | ICD-10-CM

## 2012-04-15 DIAGNOSIS — I2109 ST elevation (STEMI) myocardial infarction involving other coronary artery of anterior wall: Principal | ICD-10-CM | POA: Diagnosis present

## 2012-04-15 DIAGNOSIS — Z888 Allergy status to other drugs, medicaments and biological substances status: Secondary | ICD-10-CM

## 2012-04-15 DIAGNOSIS — E039 Hypothyroidism, unspecified: Secondary | ICD-10-CM | POA: Diagnosis present

## 2012-04-15 DIAGNOSIS — I509 Heart failure, unspecified: Secondary | ICD-10-CM | POA: Diagnosis present

## 2012-04-15 DIAGNOSIS — E781 Pure hyperglyceridemia: Secondary | ICD-10-CM | POA: Diagnosis present

## 2012-04-15 DIAGNOSIS — I5022 Chronic systolic (congestive) heart failure: Secondary | ICD-10-CM | POA: Diagnosis present

## 2012-04-15 DIAGNOSIS — Z9861 Coronary angioplasty status: Secondary | ICD-10-CM

## 2012-04-15 DIAGNOSIS — R079 Chest pain, unspecified: Secondary | ICD-10-CM

## 2012-04-15 DIAGNOSIS — I2582 Chronic total occlusion of coronary artery: Secondary | ICD-10-CM | POA: Diagnosis present

## 2012-04-15 DIAGNOSIS — Z789 Other specified health status: Secondary | ICD-10-CM

## 2012-04-15 DIAGNOSIS — I2510 Atherosclerotic heart disease of native coronary artery without angina pectoris: Secondary | ICD-10-CM | POA: Diagnosis present

## 2012-04-15 DIAGNOSIS — I25810 Atherosclerosis of coronary artery bypass graft(s) without angina pectoris: Secondary | ICD-10-CM | POA: Diagnosis present

## 2012-04-15 HISTORY — DX: Ischemic cardiomyopathy: I25.5

## 2012-04-15 HISTORY — DX: Hypothyroidism, unspecified: E03.9

## 2012-04-15 HISTORY — PX: LEFT HEART CATH: SHX5478

## 2012-04-15 LAB — HEMOGLOBIN A1C
Hgb A1c MFr Bld: 8.5 % — ABNORMAL HIGH (ref <5.7)
Mean Plasma Glucose: 197 mg/dL — ABNORMAL HIGH (ref <117)

## 2012-04-15 LAB — CBC WITH DIFFERENTIAL/PLATELET
Eosinophils Relative: 0 % (ref 0–5)
HCT: 39.7 % (ref 36.0–46.0)
Lymphocytes Relative: 18 % (ref 12–46)
Lymphs Abs: 1.7 10*3/uL (ref 0.7–4.0)
MCV: 89.4 fL (ref 78.0–100.0)
Monocytes Absolute: 0.1 10*3/uL (ref 0.1–1.0)
RBC: 4.44 MIL/uL (ref 3.87–5.11)
RDW: 14.2 % (ref 11.5–15.5)
WBC: 9.5 10*3/uL (ref 4.0–10.5)

## 2012-04-15 LAB — CBC
HCT: 39.7 % (ref 36.0–46.0)
Hemoglobin: 13.1 g/dL (ref 12.0–15.0)
MCH: 29.7 pg (ref 26.0–34.0)
MCHC: 33 g/dL (ref 30.0–36.0)
MCV: 90 fL (ref 78.0–100.0)
RBC: 4.41 MIL/uL (ref 3.87–5.11)

## 2012-04-15 LAB — COMPREHENSIVE METABOLIC PANEL
BUN: 14 mg/dL (ref 6–23)
CO2: 20 mEq/L (ref 19–32)
Chloride: 99 mEq/L (ref 96–112)
Creatinine, Ser: 0.6 mg/dL (ref 0.50–1.10)
GFR calc Af Amer: >90 mL/min (ref >90)
GFR calc non Af Amer: 84 mL/min — ABNORMAL LOW (ref >90)
Total Bilirubin: 0.7 mg/dL (ref 0.3–1.2)

## 2012-04-15 LAB — MRSA PCR SCREENING: MRSA by PCR: NEGATIVE

## 2012-04-15 LAB — MAGNESIUM: Magnesium: 1.5 mg/dL (ref 1.5–2.5)

## 2012-04-15 LAB — APTT: aPTT: 29 seconds (ref 24–37)

## 2012-04-15 LAB — POCT I-STAT, CHEM 8
Calcium, Ion: 1.32 mmol/L — ABNORMAL HIGH (ref 1.13–1.30)
Glucose, Bld: 113 mg/dL — ABNORMAL HIGH (ref 70–99)
HCT: 40 % (ref 36.0–46.0)
TCO2: 24 mmol/L (ref 0–100)

## 2012-04-15 LAB — GLUCOSE, CAPILLARY: Glucose-Capillary: 170 mg/dL — ABNORMAL HIGH (ref 70–99)

## 2012-04-15 LAB — PROTIME-INR: Prothrombin Time: 13 seconds (ref 11.6–15.2)

## 2012-04-15 LAB — TROPONIN I: Troponin I: <0.3 ng/mL (ref <0.30)

## 2012-04-15 LAB — POCT ACTIVATED CLOTTING TIME: Activated Clotting Time: 377 seconds

## 2012-04-15 SURGERY — LEFT HEART CATH
Anesthesia: LOCAL

## 2012-04-15 MED ORDER — ISOSORBIDE MONONITRATE ER 30 MG PO TB24
30.0000 mg | ORAL_TABLET | Freq: Every day | ORAL | Status: DC
  Filled 2012-04-15: qty 1

## 2012-04-15 MED ORDER — HEPARIN SODIUM (PORCINE) 5000 UNIT/ML IJ SOLN
5000.0000 [IU] | Freq: Three times a day (TID) | INTRAMUSCULAR | Status: DC
  Filled 2012-04-15 (×3): qty 1

## 2012-04-15 MED ORDER — ONDANSETRON HCL 4 MG/2ML IJ SOLN
4.0000 mg | Freq: Four times a day (QID) | INTRAMUSCULAR | Status: DC | PRN
  Administered 2012-04-18 – 2012-04-20 (×2): 4 mg via INTRAVENOUS
  Filled 2012-04-15 (×2): qty 2

## 2012-04-15 MED ORDER — MIDAZOLAM HCL 2 MG/2ML IJ SOLN
INTRAMUSCULAR | Status: AC
  Filled 2012-04-15: qty 2

## 2012-04-15 MED ORDER — FAMOTIDINE IN NACL 20-0.9 MG/50ML-% IV SOLN
INTRAVENOUS | Status: AC
  Filled 2012-04-15: qty 50

## 2012-04-15 MED ORDER — FENTANYL CITRATE 0.05 MG/ML IJ SOLN
INTRAMUSCULAR | Status: AC
  Filled 2012-04-15: qty 2

## 2012-04-15 MED ORDER — HEPARIN (PORCINE) IN NACL 2-0.9 UNIT/ML-% IJ SOLN
INTRAMUSCULAR | Status: AC
  Filled 2012-04-15: qty 1000

## 2012-04-15 MED ORDER — SODIUM CHLORIDE 0.9 % IV SOLN
1.7500 mg/kg/h | INTRAVENOUS | Status: DC
  Filled 2012-04-15: qty 250

## 2012-04-15 MED ORDER — ASPIRIN 81 MG PO CHEW: 81.0000 mg | CHEWABLE_TABLET | Freq: Every day | ORAL | Status: DC

## 2012-04-15 MED ORDER — NITROGLYCERIN 1 MG/10 ML FOR IR/CATH LAB
INTRA_ARTERIAL | Status: AC
  Filled 2012-04-15: qty 10

## 2012-04-15 MED ORDER — FOLIC ACID 1 MG PO TABS
1.0000 mg | ORAL_TABLET | Freq: Every day | ORAL | Status: DC
  Administered 2012-04-16 – 2012-04-21 (×6): 1 mg via ORAL
  Filled 2012-04-15 (×6): qty 1

## 2012-04-15 MED ORDER — ACETAMINOPHEN 325 MG PO TABS: 650.0000 mg | ORAL_TABLET | ORAL | Status: DC | PRN

## 2012-04-15 MED ORDER — HEPARIN SODIUM (PORCINE) 5000 UNIT/ML IJ SOLN
5000.0000 [IU] | Freq: Three times a day (TID) | INTRAMUSCULAR | Status: DC
  Filled 2012-04-15 (×2): qty 1

## 2012-04-15 MED ORDER — FUROSEMIDE 40 MG PO TABS
40.0000 mg | ORAL_TABLET | Freq: Two times a day (BID) | ORAL | Status: DC
  Administered 2012-04-15 – 2012-04-21 (×12): 40 mg via ORAL
  Filled 2012-04-15 (×14): qty 1

## 2012-04-15 MED ORDER — MORPHINE SULFATE 2 MG/ML IJ SOLN: 2.0000 mg | INTRAMUSCULAR | Status: DC | PRN

## 2012-04-15 MED ORDER — BIVALIRUDIN BOLUS VIA INFUSION
0.1000 mg/kg | Freq: Once | INTRAVENOUS | Status: DC
  Filled 2012-04-15: qty 9

## 2012-04-15 MED ORDER — TICAGRELOR 90 MG PO TABS
90.0000 mg | ORAL_TABLET | Freq: Two times a day (BID) | ORAL | Status: DC
  Filled 2012-04-15: qty 1

## 2012-04-15 MED ORDER — FLUTICASONE PROPIONATE 50 MCG/ACT NA SUSP
1.0000 | Freq: Every day | NASAL | Status: DC | PRN
  Filled 2012-04-15: qty 16

## 2012-04-15 MED ORDER — SODIUM CHLORIDE 0.9 % IV SOLN
0.2500 mg/kg/h | INTRAVENOUS | Status: AC
  Administered 2012-04-16: 0.25 mg/kg/h via INTRAVENOUS
  Filled 2012-04-15 (×2): qty 250

## 2012-04-15 MED ORDER — TICAGRELOR 90 MG PO TABS
90.0000 mg | ORAL_TABLET | Freq: Two times a day (BID) | ORAL | Status: DC
  Filled 2012-04-15 (×2): qty 1

## 2012-04-15 MED ORDER — NITROGLYCERIN IN D5W 200-5 MCG/ML-% IV SOLN
INTRAVENOUS | Status: AC
  Filled 2012-04-15: qty 250

## 2012-04-15 MED ORDER — LEVOTHYROXINE SODIUM 112 MCG PO TABS
55.5000 ug | ORAL_TABLET | Freq: Every day | ORAL | Status: DC
  Administered 2012-04-16 – 2012-04-19 (×4): 55.5 ug via ORAL
  Filled 2012-04-15 (×5): qty 0.5

## 2012-04-15 MED ORDER — INSULIN GLARGINE 100 UNIT/ML ~~LOC~~ SOLN
20.0000 [IU] | Freq: Every day | SUBCUTANEOUS | Status: DC
  Administered 2012-04-15: 20 [IU] via SUBCUTANEOUS
  Filled 2012-04-15 (×2): qty 0.2

## 2012-04-15 MED ORDER — BIVALIRUDIN 250 MG IV SOLR
INTRAVENOUS | Status: AC
  Filled 2012-04-15: qty 250

## 2012-04-15 MED ORDER — CARVEDILOL 12.5 MG PO TABS
12.5000 mg | ORAL_TABLET | Freq: Two times a day (BID) | ORAL | Status: DC
  Administered 2012-04-15 – 2012-04-21 (×12): 12.5 mg via ORAL
  Filled 2012-04-15 (×14): qty 1

## 2012-04-15 MED ORDER — SODIUM CHLORIDE 0.9 % IV SOLN: INTRAVENOUS | Status: AC

## 2012-04-15 MED ORDER — RAMIPRIL 5 MG PO CAPS
5.0000 mg | ORAL_CAPSULE | Freq: Every day | ORAL | Status: DC
  Administered 2012-04-15 – 2012-04-21 (×7): 5 mg via ORAL
  Filled 2012-04-15 (×7): qty 1

## 2012-04-15 MED ORDER — NITROGLYCERIN 0.4 MG SL SUBL: 0.4000 mg | SUBLINGUAL_TABLET | SUBLINGUAL | Status: DC | PRN

## 2012-04-15 MED ORDER — HYDROCODONE-ACETAMINOPHEN 5-325 MG PO TABS
1.0000 | ORAL_TABLET | Freq: Four times a day (QID) | ORAL | Status: DC | PRN
  Administered 2012-04-17: 1 via ORAL
  Filled 2012-04-15: qty 1

## 2012-04-15 MED ORDER — DIPHENHYDRAMINE HCL 50 MG/ML IJ SOLN
INTRAMUSCULAR | Status: AC
  Filled 2012-04-15: qty 1

## 2012-04-15 MED ORDER — HYDRALAZINE HCL 20 MG/ML IJ SOLN: 10.0000 mg | INTRAMUSCULAR | Status: DC

## 2012-04-15 MED ORDER — LIDOCAINE HCL (PF) 1 % IJ SOLN
INTRAMUSCULAR | Status: AC
  Filled 2012-04-15: qty 30

## 2012-04-15 MED ORDER — ASPIRIN EC 81 MG PO TBEC
81.0000 mg | DELAYED_RELEASE_TABLET | Freq: Every day | ORAL | Status: DC
  Administered 2012-04-16 – 2012-04-21 (×6): 81 mg via ORAL
  Filled 2012-04-15 (×6): qty 1

## 2012-04-15 MED ORDER — TICAGRELOR 90 MG PO TABS
ORAL_TABLET | ORAL | Status: AC
  Filled 2012-04-15: qty 2

## 2012-04-15 MED ORDER — OMEGA-3-ACID ETHYL ESTERS 1 G PO CAPS
2.0000 g | ORAL_CAPSULE | Freq: Two times a day (BID) | ORAL | Status: DC
  Filled 2012-04-15 (×3): qty 2

## 2012-04-15 MED ORDER — METHYLPREDNISOLONE SODIUM SUCC 125 MG IJ SOLR
INTRAMUSCULAR | Status: AC
  Filled 2012-04-15: qty 2

## 2012-04-15 MED ORDER — ONDANSETRON HCL 4 MG/2ML IJ SOLN: 4.0000 mg | Freq: Four times a day (QID) | INTRAMUSCULAR | Status: DC | PRN

## 2012-04-15 MED ORDER — INSULIN ASPART 100 UNIT/ML ~~LOC~~ SOLN: 0.0000 [IU] | Freq: Three times a day (TID) | SUBCUTANEOUS | Status: DC

## 2012-04-15 NOTE — Progress Notes (Signed)
  Subjective:    Patient ID: Cathy [name], female    DOB: 01/05/1931, 79 y.o.   MRN: [id number]  HPI  Patient presents for routine followup however has been having chest pain since this morning ( between 7 and 8). She called in earlier this morning states that she woke up with substernal chest pain she took one nitroglycerin as well as Tylenol the nitroglycerin helps some but was not relieved with Tylenol or her pain pill. She was advised to go to the emergency room at that time however she did not want to call EMS if she cannot find her family members. She had another episode of chest pain around knee and both associated with shortness of breath no nausea vomiting or diaphoresis. Her blood sugars have been 200s to 300s.  Review of Systems - per above  GEN- denies fatigue, fever, weight loss,weakness, recent illness HEENT- denies eye drainage, change in vision, nasal discharge, CVS- + chest pain, palpitations RESP- +SOB, cough, wheeze ABD- denies N/V, change in stools, abd pain Neuro- denies headache, dizziness, syncope, seizure activity      Objective:   Physical Exam  GEN- NAD, alert and oriented x3, sitting in wheelchair,  pale appearing HEENT- PERRL, EOMI, non injected sclera, pink conjunctiva, oropharynx clear Neck- Supple, JVD 3cm CVS- RRR, no murmur RESP-CTAB decreased bases ABD-NABS,soft,NT,ND EXT- No edema, venous stasis changes Pulses- Radial 2+      EKG- NSR, mild elevation at V1, V2, LVH,      Assessment & Plan:

## 2012-04-15 NOTE — H&P (Signed)
Patient ID: Cathy [name] MRN: [id number], DOB/AGE: 24-Sep-1930   Admit date: 04/15/2012  Primary Physician: Milinda Antis, MD Primary Cardiologist: Lovena Neighbours, MD  Pt. Profile:  79 y/o female with h/o CAD s/p prior cabg who presented today with c/p and ant st elevation.  Problem List  Past Medical History  Diagnosis Date  . Hypertension     Unspecified  . Hyperlipidemia     Mixed  . Coronary artery disease 08/21/09    Catheterization 2006 occluded OM vein graft  /   nuclear 2008 question slight ischemia  . S/P CABG (coronary artery bypass graft) 2003     2003  LIMA, LAD, SVG to the OM, SVG right coronary artery  . Aneurysm 2003    Ascending thoracic aneurysm repair with a graft at time of CABG  . Diabetes mellitus     Insulin dependent  . Gastroparesis   . Depression   . Allergy history, radiographic dye     Contrast dye allergy  . Ejection fraction     EF 50%  //  Ejection fraction 25-30% at the time of stress echo,  May, 2013  . Previous back surgery   . Syncope 2008    Question?  2008  . Statin intolerance   . Shortness of breath     Exertional shortness of breath, May, 2013  . Systolic CHF     June, 2013  . Cardiomyopathy     June, 2013  . Thyroid disease   . CHF (congestive heart failure)   . Nausea     October, 2013  . Hypotension     October, 2013    Past Surgical History  Procedure Laterality Date  . Coronary artery bypass graft  2003     LIMA, LAD, SVG to the OM, SVG right coronary artery  . Bladder tacking    . Salivary gland surgery    . Total abdominal hysterectomy    . Back surgery    . Breast surgery    . External ear surgery      Allergies  Allergies  Allergen Reactions  . Atorvastatin Other (See Comments)    Unknown-patient admitted to hospital after taking  . Citrus Dermatitis    BREAK OUT ON BODY  . Iodine   . Iohexol      Desc: ANAPHYLAXIS   . Zolpidem Tartrate     REACTION: hallucinations  . Milk-Related Compounds Rash    HPI  79 y/o female with h/o CAD s/p CABG with known occlusion of the VG->OM by cath in 2006.  She was in her usoh until this AM, when she began to experience sscp w/ sob, which was unrelieved after 1 sl ntg and tylenol.  Though she was apparently advised to present to the ED she did not and after a second episode of c/p this afternoon, she presented to her PCP's office where ECG was performed and showed anterior ST elevation.  EMS was called and Code STEMI was activated.  Upon arrival to the cath lab pt c/o midscapular back pain.  Emergent cath is ongoing.  Home Medications  Prior to Admission medications   Medication Sig Start Date End Date Taking? Authorizing Provider  acetaminophen (NON-ASPIRIN) 325 MG tablet Take 650 mg by mouth every 6 (six) hours as needed. For pain    Historical Provider, MD  amoxicillin (AMOXIL) 500 MG tablet Take 1 tablet (500 mg total) by mouth 2 (two) times daily. 03/11/12   Salley Scarlet, MD  aspirin 325 MG tablet Take 325 mg by mouth daily.     Historical Provider, MD  carvedilol (COREG) 25 MG tablet Take 12.5 mg by mouth 2 (two) times daily with a meal. 12/09/11   Luis Abed, MD  cetirizine (ZYRTEC) 10 MG tablet TAKE ONE TABLET DAILY. 02/23/12   Salley Scarlet, MD  fluticasone (FLONASE) 50 MCG/ACT nasal spray Place 1 spray into the nose daily as needed. For allergies 08/21/11 08/20/12  Salley Scarlet, MD  folic acid (FOLVITE) 1 MG tablet Take 1 mg by mouth daily.    Historical Provider, MD  furosemide (LASIX) 40 MG tablet Take 1 tablets by mouth in the morning and 1 tablet by mouth in the evening. 07/29/11   Luis Abed, MD  HUMALOG 100 UNIT/ML injection INJECT 2 TO 10 UNITS SUBCUTANEOUSLY FOUR TIMES DAILY AS DIRECTED USING SLIDING SCALE. 03/12/12   Salley Scarlet, MD  HYDROcodone-acetaminophen (VICODIN) 5-500 MG per tablet Take 1 tablet by mouth at bedtime as needed for pain. 12/04/11 12/03/12  Salley Scarlet, MD  insulin glargine (LANTUS) 100 UNIT/ML  injection  12/27/11   Kerri Perches, MD  isosorbide mononitrate (IMDUR) 30 MG 24 hr tablet Take 30 mg by mouth daily. 09/10/11   Luis Abed, MD  meclizine (ANTIVERT) 25 MG tablet TAKE (1) TABLET BY MOUTH (3) TIMES DAILY AS NEEDED. 02/23/12   Salley Scarlet, MD  metFORMIN (GLUCOPHAGE) 500 MG tablet TAKE 2 TABLETS TWICE DAILY WITH FOOD AS DIRECTED. 03/10/12   Salley Scarlet, MD  nitroGLYCERIN (NITROSTAT) 0.4 MG SL tablet Place 0.4 mg under the tongue every 5 (five) minutes as needed. May repeat times three.     Historical Provider, MD  omega-3 acid ethyl esters (LOVAZA) 1 G capsule Take 2 capsules (2 g total) by mouth 2 (two) times daily. 03/19/12   Salley Scarlet, MD  promethazine (PHENERGAN) 25 MG tablet TAKE 1 TABLET EVERY 4 TO 6 HOURS AS NEEDED FOR NAUSEA AND VOMITING. 03/23/12   Salley Scarlet, MD  ramipril (ALTACE) 5 MG capsule Take 1 capsule (5 mg total) by mouth daily. 03/11/12   Salley Scarlet, MD  SYNTHROID 112 MCG tablet TAKE 1/2 TABLET BY MOUTH ONCE DAILY. 01/07/12   Salley Scarlet, MD   Family History  Family History  Problem Relation Age of Onset  . Heart disease Other   . Heart disease Mother   . Hypertension Mother   . Heart disease Father   . Hypertension Father    Social History  History   Social History  . Marital Status: Divorced    Spouse Name: N/A    Number of Children: N/A  . Years of Education: N/A   Occupational History  . Retired    Social History Main Topics  . Smoking status: Never Smoker   . Smokeless tobacco: Never Used  . Alcohol Use: No  . Drug Use: No  . Sexually Active: Not on file   Other Topics Concern  . Not on file   Social History Narrative  . No narrative on file    Review of Systems General:  No chills, fever, night sweats or weight changes.  Cardiovascular:  +++ chest pain & dyspnea on exertion, no edema, orthopnea, palpitations, paroxysmal nocturnal dyspnea. Dermatological: No rash, lesions/masses Respiratory: No  cough, dyspnea Urologic: No hematuria, dysuria Abdominal:   No nausea, vomiting, diarrhea, bright red blood per rectum, melena, or hematemesis Neurologic:  No visual changes, wkns, changes  in mental status. All other systems reviewed and are otherwise negative except as noted above.  Physical Exam  BP 163/90 HR 89 (sinus) Height 5\' 5"  (1.651 m), weight 190 lb (86.183 kg).  General: NAD, mild chest pain. Neuro: Alert and oriented X 3. Moves all extremities spontaneously. HEENT: Normal  Neck: Supple without bruits. Lungs:  Resp regular and unlabored, CTA. Heart: RRR, soft systolic murmur. Abdomen: Soft, non-tender.  Extremities: No edema.  Labs  All labs pending   ECG  Rsr, 89, 2mm st elevation v1-v3 w/ st depression in lat leads.  Slight st elevation in III.  ASSESSMENT AND PLAN  1.  Acute Ant STEMI/CAD:  Emergent cath ongoing.  Pretreated for contrast prophylaxis.  Cont home asa, bb, acei, +/- P2Y12 inhibitor pending anatomy/intervention.  Intol to statins.    2.  HTN:  Follow.  Adjust meds as necessary.  3.  DM:  Hold metformin.  Otw cont lantus/ssi.  4.  Chronic systolic chf:  EF 20-25% 05/2011 @ time of stress echo.  Cont bb/acei.  Watch volume closely.  5.  HL/HTG:  TG 680 in February.  Intol to statins.  On lovaza.  Signed, Nicolasa Ducking, NP   Attending note:  Patient seen in cardiac catheterization lab - currently undergoing emergent procedure with Dr. Allyson Sabal having presented with anterior STEMI. Preliminary findings include patent grafts, but severe stenosis in proximal large ramus. Plans for PCI/stent per Dr. Allyson Sabal, then patient will be admitted to ICU for further care per [facility] team. Hemodynamically stable at present.  Jonelle Sidle, M.D., F.A.C.C.

## 2012-04-15 NOTE — Progress Notes (Signed)
Orthopedic Tech Progress Note Patient Details:  Cathy [name] 16-Sep-1930 [id number]  Ortho Devices Type of Ortho Device: Knee Immobilizer Ortho Device/Splint Location: (R) LE Ortho Device/Splint Interventions: Ordered;Application   Jennye Moccasin 04/15/2012, 8:58 PM

## 2012-04-15 NOTE — H&P (Signed)
  H & P will be scanned in.  Pt was reexamined and existing H & P reviewed. No changes found.  Runell Gess, MD [facility] 04/15/2012 5:57 PM

## 2012-04-15 NOTE — Assessment & Plan Note (Addendum)
Her chest pain is very concerning as she's had 2 episodes she has known coronary artery disease. I compared her EKG  with  the most recent EKG she had in October .The leads of V1 and V2 were elevated some today compared to Oct however she had some in the past which showed some mild elevation as well. When EMS arrived they reviewed the EKG again and there was  concern for possible STEMI because of the contiguous leads I reviewed this with my partner we did see a change and EMS called the STEMI ,pt  will be transported to [facility]

## 2012-04-15 NOTE — CV Procedure (Signed)
Cathy [name] is a 79 y.o. female    [id number] LOCATION:  FACILITY: [facility]  PHYSICIAN: Nanetta Batty, M.D. 01-14-1931   DATE OF PROCEDURE:  04/15/2012  DATE OF DISCHARGE:  [facility]  CARDIAC CATHETERIZATION / PCI    History obtained from chart review. Cathy [name] is an 79 year old female with a history of CAD status post coronary artery bypass grafting in 2003 along with thoracic aortic aneurysm repair, patient of Dr. Jerral Bonito, who developed chest pain last night and today went to see her primary care doctor in refill where she was noted to have ST segment elevation in her anterior precordial leads. She was transferred by [facility] to tone for urgent cath and potential intervention.   PROCEDURE DESCRIPTION:     The patient was brought to the second floor [place] [facility]iac cath lab in the postabsorptive state. She was  premedicated with IV Versed and fentanyl.. Her right groinwas prepped and shaved in usual sterile fashion. Xylocaine 1% was used  for local anesthesia. A 6 French sheath was inserted into the right common femoral artery using standard Seldinger technique. 6 French right and left Judkins diagnostic catheters along with a 6 French RCB catheter and pigtail catheters were used for selective coronary angiography, selective graft angiography and left ventriculography. Visipaque dye was used for the entirety of the case. Retrograde aortic, left ventricular and pullback pressures were recorded.   HEMODYNAMICS:    AO SYSTOLIC/AO DIASTOLIC: 145/64   LV SYSTOLIC/LV DIASTOLIC: 145/26  ANGIOGRAPHIC RESULTS:   1. Left main; normal  2. LAD; patent stent in the proximal portion, 60-70% segmental proximal with competitive flow distally at the diagonal branch 3. Left circumflex; the native circumflex is occluded in the AV groove. It gave off a large first obtuse marginal branch and the ramus is distribution and a 95% stenosis in the midportion on a  "hairpin turn"..  4. Right coronary artery; occluded proximally 5.LIMA TO LAD; widely patent with a severely and diffusely diseased the distal and apical LAD 6. SVG TO distal right coronary artery is widely patent     SVG TO circumflex was occluded at the origin which was an old finding     7. Left ventriculography; RAO left ventriculogram was performed using  25 mL of Visipaque dye at 12 mL/second. The overall LVEF estimated  25-30 % Without wall motion abnormalities  IMPRESSION:Cathy [name] has a 95% ramus branch stenosis representing her "culprit lesion. Her anatomy otherwise is unchanged from her prior cath performed by Dr. Bonnee [name] in 2011.we'll proceed with attempt at PCI and stenting of the ramus branch.  Procedure description: The patient had already received aspirin, and I gave her 180 mg of Brilenta orally. She received Angiomax bolus with an ACT of 377. A total of 290 cc of contrast was administered during the case to the patient. Using a 6 [place] XB 3.5 and 4.0 guide catheter along with multiple wires including [employer]  soft, P[place]ater , [employer], and Choice PT attempts were made to cross the ramus branch lesion. I was able to cross the lesion and perform PTCA using a 2.5 x 8 mm balloon. I was unable to pass a 2.25 x 8 mm stent using the buddy wire technique. Ultimately I tried a guideliner to improve backup and support unfortunately the stent was unable to navigate the first "hairpin turn ". After 60 minutes of fluoroscopy time and 300 cc of  contrast I decided to abort the procedure since there was  TIMI-3 flow and the patient was pain-free.  Final impression: Successful PTCA of a high-grade ramus branch lesion with a suboptimal angiographic result, adn unsuccessful stenting because of tortuous  anatomy. The guidewire and catheter were removed and the sheath was then secured in place. The patient left the cath lab he was stable on IV heparin and reduce dose Angiomax . I discussed this with Cathy [name], the attending on call for the patient tonight. We will consider reattempt intervention tomorrow.  Cathy Gess MD, [facility] 04/15/2012 8:17 PM

## 2012-04-15 NOTE — Assessment & Plan Note (Addendum)
Unstable angina, transported per above for EKG changes, ASA given again by EMS

## 2012-04-15 NOTE — Patient Instructions (Signed)
Go directly to the ER for heart attack rule out

## 2012-04-16 ENCOUNTER — Inpatient Hospital Stay (HOSPITAL_COMMUNITY): Payer: Medicare Other

## 2012-04-16 ENCOUNTER — Encounter (HOSPITAL_COMMUNITY): Payer: Self-pay | Admitting: *Deleted

## 2012-04-16 DIAGNOSIS — I2109 ST elevation (STEMI) myocardial infarction involving other coronary artery of anterior wall: Secondary | ICD-10-CM

## 2012-04-16 LAB — LIPID PANEL
HDL: 34 mg/dL — ABNORMAL LOW (ref >39)
LDL Cholesterol: UNDETERMINED mg/dL (ref 0–99)
Triglycerides: 555 mg/dL — ABNORMAL HIGH (ref <150)
VLDL: UNDETERMINED mg/dL (ref 0–40)

## 2012-04-16 LAB — CBC
Platelets: 221 10*3/uL (ref 150–400)
RDW: 14.1 % (ref 11.5–15.5)
WBC: 10.8 10*3/uL — ABNORMAL HIGH (ref 4.0–10.5)

## 2012-04-16 LAB — BASIC METABOLIC PANEL
Chloride: 99 mEq/L (ref 96–112)
Creatinine, Ser: 0.73 mg/dL (ref 0.50–1.10)
GFR calc Af Amer: >90 mL/min (ref >90)
Potassium: 4 mEq/L (ref 3.5–5.1)

## 2012-04-16 LAB — TROPONIN I: Troponin I: 1.12 ng/mL (ref <0.30)

## 2012-04-16 LAB — GLUCOSE, CAPILLARY
Glucose-Capillary: 299 mg/dL — ABNORMAL HIGH (ref 70–99)
Glucose-Capillary: 336 mg/dL — ABNORMAL HIGH (ref 70–99)

## 2012-04-16 LAB — POCT ACTIVATED CLOTTING TIME: Activated Clotting Time: 154 seconds

## 2012-04-16 LAB — T4, FREE: Free T4: 1.72 ng/dL (ref 0.80–1.80)

## 2012-04-16 MED ORDER — NITROGLYCERIN IN D5W 200-5 MCG/ML-% IV SOLN
2.0000 ug/min | INTRAVENOUS | Status: DC
  Administered 2012-04-16: 20 ug/min via INTRAVENOUS

## 2012-04-16 MED ORDER — INSULIN GLARGINE 100 UNIT/ML ~~LOC~~ SOLN
40.0000 [IU] | Freq: Every day | SUBCUTANEOUS | Status: DC
  Administered 2012-04-16 – 2012-04-20 (×5): 40 [IU] via SUBCUTANEOUS
  Filled 2012-04-16 (×7): qty 0.4

## 2012-04-16 MED ORDER — CLOPIDOGREL BISULFATE 75 MG PO TABS
75.0000 mg | ORAL_TABLET | Freq: Every day | ORAL | Status: DC
  Administered 2012-04-16 – 2012-04-21 (×6): 75 mg via ORAL
  Filled 2012-04-16 (×6): qty 1

## 2012-04-16 MED ORDER — INSULIN REGULAR HUMAN 100 UNIT/ML IJ SOLN
15.0000 [IU] | Freq: Once | INTRAMUSCULAR | Status: AC
  Filled 2012-04-16: qty 0.15

## 2012-04-16 MED ORDER — ISOSORBIDE MONONITRATE ER 30 MG PO TB24
30.0000 mg | ORAL_TABLET | Freq: Every day | ORAL | Status: DC
  Administered 2012-04-16 – 2012-04-21 (×6): 30 mg via ORAL
  Filled 2012-04-16 (×6): qty 1

## 2012-04-16 MED ORDER — INSULIN ASPART 100 UNIT/ML ~~LOC~~ SOLN
0.0000 [IU] | Freq: Three times a day (TID) | SUBCUTANEOUS | Status: DC
  Administered 2012-04-16: 20 [IU] via SUBCUTANEOUS
  Administered 2012-04-16: 15 [IU] via SUBCUTANEOUS
  Administered 2012-04-16: 11 [IU] via SUBCUTANEOUS
  Administered 2012-04-16: 20 [IU] via SUBCUTANEOUS
  Administered 2012-04-17: 15 [IU] via SUBCUTANEOUS

## 2012-04-16 MED ORDER — HEPARIN (PORCINE) IN NACL 100-0.45 UNIT/ML-% IJ SOLN
1200.0000 [IU]/h | INTRAMUSCULAR | Status: DC
  Administered 2012-04-16: 1200 [IU]/h via INTRAVENOUS
  Filled 2012-04-16: qty 250

## 2012-04-16 MED ORDER — SODIUM CHLORIDE 0.9 % IV SOLN: INTRAVENOUS | Status: DC

## 2012-04-16 MED FILL — Dextrose Inj 5%: INTRAVENOUS | Qty: 50 | Status: AC

## 2012-04-16 NOTE — Progress Notes (Signed)
Patient ID: Cathy [name], female   DOB: 1930/10/19, 79 y.o.   MRN: [id number]   SUBJECTIVE:  The patient had a long and difficult procedure in the cath lab last night. The lesion in her ramus was ballooned open with good flow. However there is a severe bend and a stent could not be placed. This morning I have seen the patient. I spoke with Dr. Allyson Sabal who did a great job with the procedure last night. I spoke with Dr. Excell Seltzer who helped me this morning by reviewing the films. He feels that we should not consider proceeding with any other intervention to the ramus.  The patient is stable today. She's not having any recurrent symptoms. We will proceed with medical therapy.   Filed Vitals:   04/16/12 0750 04/16/12 0800 04/16/12 0900 04/16/12 1000  BP:      Pulse:  92 92 91  Temp: 98.5 F (36.9 C)     TempSrc: Oral     Resp:  22 18 19   Height:      Weight:      SpO2:  99% 99% 96%    Intake/Output Summary (Last 24 hours) at 04/16/12 1103 Last data filed at 04/16/12 1000  Gross per 24 hour  Intake 1007.4 ml  Output   3500 ml  Net -2492.6 ml    LABS: Basic Metabolic Panel:  Recent Labs  40/98/11 2122 04/16/12 0431  NA 135 136  K 4.0 4.0  CL 99 99  CO2 20 21  GLUCOSE 202* 414*  BUN 14 17  CREATININE 0.60 0.73  CALCIUM 9.7 9.6  MG 1.5  --    Liver Function Tests:  Recent Labs  04/15/12 2122  AST 12  ALT 9  ALKPHOS 62  BILITOT 0.7  PROT 6.5  ALBUMIN 3.2*   No results found for this basename: LIPASE, AMYLASE,  in the last 72 hours CBC:  Recent Labs  04/15/12 2122 04/16/12 0431  WBC 9.5 10.8*  NEUTROABS 7.7  --   HGB 13.3 13.3  HCT 39.7 39.8  MCV 89.4 89.0  PLT 200 221   Cardiac Enzymes:  Recent Labs  04/15/12 1800 04/15/12 2122 04/16/12 0250 04/16/12 0828  CKTOTAL 47  --   --   --   CKMB 2.8  --   --   --   TROPONINI <0.30 0.42* 1.12* 1.58*   BNP: No components found with this basename: POCBNP,  D-Dimer: No results found for this basename:  DDIMER,  in the last 72 hours Hemoglobin A1C:  Recent Labs  04/15/12 1800  HGBA1C 8.5*   Fasting Lipid Panel:  Recent Labs  04/16/12 0431  CHOL 218*  HDL 34*  LDLCALC UNABLE TO CALCULATE IF TRIGLYCERIDE OVER 400 mg/dL  TRIG 914*  CHOLHDL 6.4   Thyroid Function Tests:  Recent Labs  04/15/12 2122  TSH 0.197*    RADIOLOGY: Mr Laqueta Jean NW Contrast  03/29/2012  *RADIOLOGY REPORT*  Clinical Data: Blurred vision and headaches for past couple months. No recent injury.  Diabetic hypertensive patient with hyperlipidemia.  MRI HEAD WITHOUT AND WITH CONTRAST  Technique:  Multiplanar, multiecho pulse sequences of the brain and surrounding structures were obtained according to standard protocol without and with intravenous contrast  Contrast: 15mL MULTIHANCE GADOBENATE DIMEGLUMINE 529 MG/ML IV SOLN  Comparison: 09/20/2011 CT.  02/10/2005 MR.  Findings: No acute infarct.  Area of blood breakdown products superior left cerebellum may be related to remote trauma, hemorrhage or hemorrhagic ischemia.  No intracranial  mass or abnormal enhancement.  No hydrocephalus.  Remote infarcts basal ganglia and thalamus bilaterally.  Moderate small vessel disease type changes.  Global atrophy without hydrocephalus.  Major intracranial vascular structures are patent. Ectatic vertebral arteries and basilar artery with atherosclerotic type changes right vertebral artery suspected.  Cervical medullary junction, pituitary region, pineal region and orbital structures unremarkable.  Minimal paranasal sinus mucosal thickening.  IMPRESSION: No acute infarct.  Area of blood breakdown products superior left cerebellum may be related to remote trauma, hemorrhage or hemorrhagic ischemia.  No intracranial mass or abnormal enhancement.  Remote infarcts basal ganglia and thalamus bilaterally.  Moderate small vessel disease type changes.  Global atrophy without hydrocephalus.  Major intracranial vascular structures are patent. Ectatic  vertebral arteries and basilar artery with atherosclerotic type changes right vertebral artery suspected.   Original Report Authenticated By: Lacy Duverney, M.D.    Dg Chest Port 1 View  04/16/2012  *RADIOLOGY REPORT*  Clinical Data: STEMI, shortness of breath  PORTABLE CHEST - 1 VIEW  Comparison: 10/24/2011  Findings: Postsurgical changes are again noted.  The cardiac shadow is within normal limits.  The lungs are clear bilaterally.  No acute bony abnormality is seen.  IMPRESSION: No acute abnormality noted.   Original Report Authenticated By: Alcide Clever, M.D.     PHYSICAL EXAM   Patient is oriented to person time and place. Affect is normal. She's lying flat in bed without any problems. There is no jugulovenous distention. Lungs reveal a few scattered rhonchi. Cardiac exam reveals S1 and S2. The abdomen is soft. There is no significant peripheral edema.   TELEMETRY:  I have reviewed telemetry today April 16, 2012.   ASSESSMENT AND PLAN:  The problems are completely listed on an additional note written by me and Dayna Dunn PA-C  [name] 04/16/2012 11:03 AM

## 2012-04-16 NOTE — Progress Notes (Signed)
Chaplain responded to Code STEMI page for pt coming to [facility] via [facility]. Pt was brought directly to Cath Lab. I met pt's great niece in Short Stay waiting area and reviewed cath lab process with her. When I returned she was gone but her mom (pt's niece) was there. I was present as doctor reported to niece on results of the procedure. Pt has a 95% blockage that could not be repaired due to its location in a curve. Pt's niece is a Engineer, civil (consulting) and was accepting of the results. Later I showed pt's niece to pt's room in 2905.

## 2012-04-16 NOTE — Progress Notes (Signed)
Brief Nutrition Note:   RD pulled to pt for Malnutrition Screening Tool (MST) report of unsure of unintentional weight loss. Weight hx shows stable weight x several months. Some weight loss would be appropriate for this pt. Pt denied poor appetite on MST.   Wt Readings from Last 5 Encounters:  04/15/12 190 lb (86.183 kg)  04/15/12 190 lb 0.6 oz (86.202 kg)  04/15/12 190 lb (86.183 kg)  03/11/12 189 lb (85.73 kg)  01/06/12 186 lb (84.369 kg)   Body mass index is 31.62 kg/(m^2). Obesity class 1 Diet: Carb Mod Medium Recommend adding 2gm sodium restriction No meal completion documented a thing time  Chart reviewed, no nutrition interventions at this time. Please consult as needed.   Clarene Duke RD, LDN Pager [phone] After Hours pager [phone]

## 2012-04-16 NOTE — Progress Notes (Signed)
6 french sheath pulled. Pressure x 20 minutes. Groin unremarkable, no hematoma, Pedal Pulse palpated and verified by RN.

## 2012-04-16 NOTE — Progress Notes (Signed)
ANTICOAGULATION CONSULT NOTE - Initial Consult  Pharmacy Consult for UFH Indication: ACS/STEMI s/p cardiac cath  Allergies  Allergen Reactions  . Atorvastatin Other (See Comments)    Unknown-patient admitted to hospital after taking  . Citrus Dermatitis    BREAK OUT ON BODY  . Iodine   . Iohexol      Desc: ANAPHYLAXIS   . Zolpidem Tartrate     REACTION: hallucinations  . Milk-Related Compounds Rash    Patient Measurements: Height: 5\' 5"  (165.1 cm) Weight: 190 lb (86.183 kg) IBW/kg (Calculated) : 57 Heparin Dosing Weight: 76kg  Vital Signs: Temp: 98.5 F (36.9 C) (03/28 0750) Temp src: Oral (03/28 0750) BP: 155/66 mmHg (03/27 2303) Pulse Rate: 93 (03/28 0600)  Labs:  Recent Labs  04/15/12 1800 04/15/12 1807 04/15/12 2122 04/16/12 0250 04/16/12 0431  HGB 13.1 13.6 13.3  --  13.3  HCT 39.7 40.0 39.7  --  39.8  PLT 238  --  200  --  221  APTT 29  --   --   --   --   LABPROT 13.0  --  21.3*  --   --   INR 0.99  --  1.93*  --   --   CREATININE  --  0.70 0.60  --  0.73  CKTOTAL 47  --   --   --   --   CKMB 2.8  --   --   --   --   TROPONINI <0.30  --  0.42* 1.12*  --     Estimated Creatinine Clearance: 60.8 ml/min (by C-G formula based on Cr of 0.73).   Medical History: Past Medical History  Diagnosis Date  . Hypertension     Unspecified  . Hyperlipidemia     Mixed  . Coronary artery disease 08/21/09    Catheterization 2006 occluded OM vein graft  /   nuclear 2008 question slight ischemia  . S/P CABG (coronary artery bypass graft) 2003     2003  LIMA, LAD, SVG to the OM, SVG right coronary artery  . Aneurysm 2003    Ascending thoracic aneurysm repair with a graft at time of CABG  . Diabetes mellitus     Insulin dependent  . Gastroparesis   . Depression   . Allergy history, radiographic dye     Contrast dye allergy  . Ejection fraction     EF 50%  //  Ejection fraction 25-30% at the time of stress echo,  May, 2013  . Previous back surgery   .  Syncope 2008    Question?  2008  . Statin intolerance   . Shortness of breath     Exertional shortness of breath, May, 2013  . Systolic CHF     June, 2013  . Cardiomyopathy     June, 2013  . Thyroid disease   . CHF (congestive heart failure)   . Nausea     October, 2013  . Hypotension     October, 2013    Medications:  Scheduled:  . aspirin EC  81 mg Oral Daily  . [COMPLETED] bivalirudin      . carvedilol  12.5 mg Oral BID WC  . clopidogrel  75 mg Oral Daily  . [COMPLETED] diphenhydrAMINE      . [COMPLETED] famotidine      . [COMPLETED] fentaNYL      . [COMPLETED] fentaNYL      . folic acid  1 mg Oral Daily  . furosemide  40  mg Oral BID  . [COMPLETED] heparin      . hydrALAZINE  10 mg Intravenous UD  . insulin aspart  0-20 Units Subcutaneous TID WC  . insulin glargine  20 Units Subcutaneous QHS  . levothyroxine  55.5 mcg Oral QAC breakfast  . [COMPLETED] lidocaine (PF)      . [COMPLETED] methylPREDNISolone sodium succinate      . [COMPLETED] midazolam      . [COMPLETED] midazolam      . [COMPLETED] nitroGLYCERIN      . [COMPLETED] nitroGLYCERIN      . ramipril  5 mg Oral Daily  . [COMPLETED] Ticagrelor      . [DISCONTINUED] aspirin  81 mg Oral Daily  . [DISCONTINUED] bivalirudin  0.1 mg/kg Intravenous Once  . [DISCONTINUED] heparin  5,000 Units Subcutaneous Q8H  . [DISCONTINUED] heparin  5,000 Units Subcutaneous Q8H  . [DISCONTINUED] insulin aspart  0-15 Units Subcutaneous TID WC  . [DISCONTINUED] isosorbide mononitrate  30 mg Oral Daily  . [DISCONTINUED] omega-3 acid ethyl esters  2 g Oral BID  . [DISCONTINUED] Ticagrelor  90 mg Oral BID  . [DISCONTINUED] Ticagrelor  90 mg Oral BID   Infusions:  . [EXPIRED] sodium chloride 75 mL/hr at 04/15/12 2210  . sodium chloride 10 mL/hr at 04/16/12 0615  . [EXPIRED] bivalirudin (ANGIOMAX) infusion 5 mg/mL (Cath Lab,ACS,PCI indication) 0.25 mg/kg/hr (04/16/12 0618)  . nitroGLYCERIN    . [DISCONTINUED] bivalirudin  (ANGIOMAX) infusion 5 mg/mL (Cath Lab,ACS,PCI indication)      Assessment: 79 y/o female patient admitted with STEMI, underwent PTCA of high-grade ramus branch lesiona with suboptimal result and unsuccessful stenting d/t anatomy requiring further anticoagulation. Patient received angiomax x 12 hours, now to transition to heparin infusion. Noted recent possible CVA on 3.20, will not give bolus and use lower goal range.  Goal of Therapy:  Heparin level 0.3-0.5 units/ml Monitor platelets by anticoagulation protocol: Yes   Plan:  Begin heparin gtt at 1200 units/hr, check 8 hour heparin level with daily cbc and heparin level.  Verlene Mayer, PharmD, BCPS Pager [phone] 04/16/2012,8:34 AM

## 2012-04-16 NOTE — Care Management Note (Signed)
    Page 1 of 1   04/16/2012     10:37:08 AM   CARE MANAGEMENT NOTE 04/16/2012  Patient:  DAWANDA, MAPEL   Account Number:  [id number]  Date Initiated:  04/16/2012  Documentation initiated by:  Junius Creamer  Subjective/Objective Assessment:   adm w mi     Action/Plan:   lives alone, pcp dr Wynona Neat   Anticipated DC Date:     Anticipated DC Plan:        DC Planning Services  CM consult      Choice offered to / List presented to:             Status of service:   Medicare Important Message given?   (If response is "NO", the following Medicare IM given date fields will be blank) Date Medicare IM given:   Date Additional Medicare IM given:    Discharge Disposition:    Per UR Regulation:  Reviewed for med. necessity/level of care/duration of stay  If discussed at Long Length of Stay Meetings, dates discussed:    Comments:  3/28 1034 debbie Corena Tilson rn,bsn

## 2012-04-16 NOTE — Progress Notes (Signed)
Patient: Cathy [name] / Admit Date: 04/15/2012 / Date of Encounter: 04/16/2012, 7:00 AM   Subjective  Denies CP. Feeling better. No SOB. Just tired.   Objective   Telemetry: NSR occ PVCs EKG: NSR left axis deviation, septal infarct age undetermined, TWI I, avL, TWI V4-V6 with slight ST depression (up to 2mm TWI V6, 0.20mm ST dep V5-V6), ST abnormalities in V2-V3  Physical Exam: Filed Vitals:   04/16/12 0600  BP: 115/59  Pulse: 93  Temp: 98.5  Resp: 20   General: Well developed, well nourished elderly WF in no acute distress. Head: Normocephalic, atraumatic, sclera non-icteric, no xanthomas, nares are without discharge. Neck: JVD not elevated. Lungs: Clear bilaterally to auscultation without wheezes, rales, or rhonchi. Breathing is unlabored. Heart: RRR S1 S2, soft systolic murmur, no rubs or gallops.  Abdomen: Soft, non-tender, non-distended with normoactive bowel sounds. No hepatomegaly. No rebound/guarding. No obvious abdominal masses. Msk:  Strength and tone appear normal for age. Extremities: No clubbing or cyanosis. Tr edema.  Distal pedal pulses are 2+ and equal bilaterally. Neuro: Alert and oriented X 3. Moves all extremities spontaneously. Psych:  Responds to questions appropriately with a normal affect.    Intake/Output Summary (Last 24 hours) at 04/16/12 0700 Last data filed at 04/16/12 0615  Gross per 24 hour  Intake  822.8 ml  Output   2150 ml  Net -1327.2 ml    Inpatient Medications:  . aspirin EC  81 mg Oral Daily  . carvedilol  12.5 mg Oral BID WC  . folic acid  1 mg Oral Daily  . furosemide  40 mg Oral BID  . heparin  5,000 Units Subcutaneous Q8H  . hydrALAZINE  10 mg Intravenous UD  . insulin aspart  0-15 Units Subcutaneous TID WC  . insulin glargine  20 Units Subcutaneous QHS  . isosorbide mononitrate  30 mg Oral Daily  . levothyroxine  55.5 mcg Oral QAC breakfast  . omega-3 acid ethyl esters  2 g Oral BID  . ramipril  5 mg Oral Daily  .  Ticagrelor  90 mg Oral BID    Labs:  Recent Labs  04/15/12 2122 04/16/12 0431  NA 135 136  K 4.0 4.0  CL 99 99  CO2 20 21  GLUCOSE 202* 414*  BUN 14 17  CREATININE 0.60 0.73  CALCIUM 9.7 9.6  MG 1.5  --     Recent Labs  04/15/12 2122  AST 12  ALT 9  ALKPHOS 62  BILITOT 0.7  PROT 6.5  ALBUMIN 3.2*    Recent Labs  04/15/12 2122 04/16/12 0431  WBC 9.5 10.8*  NEUTROABS 7.7  --   HGB 13.3 13.3  HCT 39.7 39.8  MCV 89.4 89.0  PLT 200 221    Recent Labs  04/15/12 1800 04/15/12 2122 04/16/12 0250  CKTOTAL 47  --   --   CKMB 2.8  --   --   TROPONINI <0.30 0.42* 1.12*    Recent Labs  04/15/12 1800  HGBA1C 8.5*    Recent Labs  04/16/12 0431  CHOL 218*  HDL 34*  LDLCALC UNABLE TO CALCULATE IF TRIGLYCERIDE OVER 400 mg/dL  TRIG 161*  CHOLHDL 6.4    Radiology/Studies:  Mr Laqueta Jean WR Contrast  03/29/2012  *RADIOLOGY REPORT*  Clinical Data: Blurred vision and headaches for past couple months. No recent injury.  Diabetic hypertensive patient with hyperlipidemia.  MRI HEAD WITHOUT AND WITH CONTRAST  Technique:  Multiplanar, multiecho pulse sequences of the brain  and surrounding structures were obtained according to standard protocol without and with intravenous contrast  Contrast: 15mL MULTIHANCE GADOBENATE DIMEGLUMINE 529 MG/ML IV SOLN  Comparison: 09/20/2011 CT.  02/10/2005 MR.  Findings: No acute infarct.  Area of blood breakdown products superior left cerebellum may be related to remote trauma, hemorrhage or hemorrhagic ischemia.  No intracranial mass or abnormal enhancement.  No hydrocephalus.  Remote infarcts basal ganglia and thalamus bilaterally.  Moderate small vessel disease type changes.  Global atrophy without hydrocephalus.  Major intracranial vascular structures are patent. Ectatic vertebral arteries and basilar artery with atherosclerotic type changes right vertebral artery suspected.  Cervical medullary junction, pituitary region, pineal region and  orbital structures unremarkable.  Minimal paranasal sinus mucosal thickening.  IMPRESSION: No acute infarct.  Area of blood breakdown products superior left cerebellum may be related to remote trauma, hemorrhage or hemorrhagic ischemia.  No intracranial mass or abnormal enhancement.  Remote infarcts basal ganglia and thalamus bilaterally.  Moderate small vessel disease type changes.  Global atrophy without hydrocephalus.  Major intracranial vascular structures are patent. Ectatic vertebral arteries and basilar artery with atherosclerotic type changes right vertebral artery suspected.   Original Report Authenticated By: Lacy Duverney, M.D.      Assessment and Plan  1. CAD (prior CABG) with anterior STEMI s/p PTCA of high-grade ramus branch lesion with suboptimal result and unsuccessful stenting because of tortuous anatomy with consideration for further reattempt at intervention. Cont BB, ASA. Intolerant to statins. Given results of recent outpatient MRI 03/2012 ("Area of blood breakdown products superior left cerebellum may be related to remote trauma, hemorrhage or hemorrhagic ischemia" and "Remote infarcts basal ganglia and thalamus bilaterally"), will discontinue Brilinta and start Plavix. Per discussion with Dr. Myrtis Ser, after angiomax runs out will transition to heparin gtt without bolus. Dr. Myrtis Ser to discuss films with interventionalists this morning. Hold Imdur while she is remains on IV NTG (stable at 31mcg/min overnight).  2. Known ICM/chronic systolic CHF - EF similar by cath at 25-30%. Get baseline CXR. Cont ACEI, BB. Watch volume.  3. HTN - currently controlled.   4. DM, uncontrolled with A1C 8.5 - Holding Metformin. Will ask pharmacy to clarify home dose of Lantus. Increase SSI to resistant for now given elevated CBGs. Will need to adjust Lantus/SSI as hospitalization goes along given varying PO status.  5. HL/HTG - trig remains elevated. Intol to statins. Pt refusing Lovaza as she was reports she  was told not to take fish oil due to dye allergy. She does not eat fish so unclear if there is any true associated allergy. Will dc this order. MD to advise.  6. Hypothyroidism - suppressed TSH. Recheck TSH and free T4 with next lab draw.  7. Prior CVA by MRI 03/2012 - pt had previously had dizziness as outpatient prompting that MRI by PCP. This noted remote infarcts and also breakdown of remote blood products. She does not recall any stroke events and has no neurologic deficits. Monitor.  Signed, Ronie Spies PA-C Patient seen and examined. I agree with the assessment and plan as detailed above. See also my additional thoughts below.   I've seen and examined the patient today. I've spoken at length with Ronie Spies PA-C  About the plans. I called and spoke with Dr. Allyson Sabal who did the procedure last night. I called and spoke with Dr.Cooper who has reviewed the films to help with planning. Dr. Excell Seltzer feels that we should not proceed with any further intervention. I spent a prolonged period of  time making clinical decisions and reviewing this with the patient. At this point we will proceed with medical therapy of her coronary disease. I've given the orders for her she is to be removed. Her heparin that was started briefly this morning will be stopped. It will not be restarted. Because of the findings on her head CT done recently, we will not use Brilinta or Prasugrel. We have switched her to Plavix.  The patient has significant left ventricular dysfunction. Her overall volume status is morning appears to be stable. We will need several days for her to recover in the hospital.  [name], MD, [facility] 04/16/2012 11:08 AM

## 2012-04-17 LAB — CBC
HCT: 37.8 % (ref 36.0–46.0)
Hemoglobin: 12.8 g/dL (ref 12.0–15.0)
MCV: 88.1 fL (ref 78.0–100.0)
RDW: 14.6 % (ref 11.5–15.5)
WBC: 11.5 10*3/uL — ABNORMAL HIGH (ref 4.0–10.5)

## 2012-04-17 LAB — BASIC METABOLIC PANEL
CO2: 25 mEq/L (ref 19–32)
Chloride: 99 mEq/L (ref 96–112)
Creatinine, Ser: 0.84 mg/dL (ref 0.50–1.10)
GFR calc Af Amer: 74 mL/min — ABNORMAL LOW (ref >90)
Potassium: 3.9 mEq/L (ref 3.5–5.1)

## 2012-04-17 LAB — GLUCOSE, CAPILLARY
Glucose-Capillary: 308 mg/dL — ABNORMAL HIGH (ref 70–99)
Glucose-Capillary: 276 mg/dL — ABNORMAL HIGH (ref 70–99)
Glucose-Capillary: 274 mg/dL — ABNORMAL HIGH (ref 70–99)

## 2012-04-17 MED ORDER — INSULIN ASPART 100 UNIT/ML ~~LOC~~ SOLN
4.0000 [IU] | Freq: Three times a day (TID) | SUBCUTANEOUS | Status: DC
  Administered 2012-04-17 – 2012-04-21 (×13): 4 [IU] via SUBCUTANEOUS

## 2012-04-17 MED ORDER — INSULIN ASPART 100 UNIT/ML ~~LOC~~ SOLN
0.0000 [IU] | Freq: Three times a day (TID) | SUBCUTANEOUS | Status: DC
  Administered 2012-04-17: 11 [IU] via SUBCUTANEOUS
  Administered 2012-04-17: 15 [IU] via SUBCUTANEOUS
  Administered 2012-04-18 – 2012-04-19 (×5): 11 [IU] via SUBCUTANEOUS
  Administered 2012-04-19: 3 [IU] via SUBCUTANEOUS
  Administered 2012-04-20: 15 [IU] via SUBCUTANEOUS
  Administered 2012-04-20: 11 [IU] via SUBCUTANEOUS
  Administered 2012-04-20: 7 [IU] via SUBCUTANEOUS
  Administered 2012-04-21: 11 [IU] via SUBCUTANEOUS
  Administered 2012-04-21: 20 [IU] via SUBCUTANEOUS

## 2012-04-17 NOTE — Progress Notes (Signed)
CARDIAC REHAB PHASE I   PRE:  Rate/Rhythm: 88 SR    BP: sitting 122/64    SaO2: 97 2L, 96 RA  MODE:  Ambulation: to door   POST:  Rate/Rhythm: 97 SR    BP: sitting 100/66     SaO2: 97 RA  Pt sts she only walks inside her house. She never walks outside. Family provides groceries, etc. Uses a cane. Able to walk to door of room fairly steady with RW, assist x1. Major c/o was her neck that hurts always when she is up. To recliner, reclined back due to neck pain. Gave MI book and diets with discussion. BP lower after walk but denied sx. Sts she always has blurred vision due to DM. 1120-1203   Elissa Lovett Maitland CES, ACSM 04/17/2012 12:46 PM

## 2012-04-17 NOTE — Progress Notes (Signed)
SUBJECTIVE:  No chest pain.  No SOB.     PHYSICAL EXAM Filed Vitals:   04/16/12 2100 04/16/12 2310 04/17/12 0424 04/17/12 0442  BP: 123/78 126/50 144/55   Pulse: 86 89 88   Temp:   98.8 F (37.1 C)   TempSrc:   Oral   Resp: 22 14 22    Height:      Weight:    187 lb 9.8 oz (85.1 kg)  SpO2: 97% 95% 96%    General:  No distress Lungs:  Clear Heart:  RRR Abdomen:  Positive bowel sounds, no rebound no guarding Extremities:  No edema, right groin OK  LABS: Lab Results  Component Value Date   CKTOTAL 47 04/15/2012   CKMB 2.8 04/15/2012   TROPONINI 1.58* 04/16/2012   Results for orders placed during the hospital encounter of 04/15/12 (from the past 24 hour(s))  GLUCOSE, CAPILLARY     Status: Abnormal   Collection Time    04/16/12  7:52 AM      Result Value Range   Glucose-Capillary 398 (*) 70 - 99 mg/dL  TROPONIN I     Status: Abnormal   Collection Time    04/16/12  8:28 AM      Result Value Range   Troponin I 1.58 (*) <0.30 ng/mL  T4, FREE     Status: None   Collection Time    04/16/12  8:28 AM      Result Value Range   Free T4 1.72  0.80 - 1.80 ng/dL  TSH     Status: Abnormal   Collection Time    04/16/12  8:28 AM      Result Value Range   TSH 0.110 (*) 0.350 - 4.500 uIU/mL  POCT ACTIVATED CLOTTING TIME     Status: None   Collection Time    04/16/12 12:10 PM      Result Value Range   Activated Clotting Time 154    GLUCOSE, CAPILLARY     Status: Abnormal   Collection Time    04/16/12 12:57 PM      Result Value Range   Glucose-Capillary 299 (*) 70 - 99 mg/dL  GLUCOSE, CAPILLARY     Status: Abnormal   Collection Time    04/16/12  4:29 PM      Result Value Range   Glucose-Capillary 351 (*) 70 - 99 mg/dL  GLUCOSE, CAPILLARY     Status: Abnormal   Collection Time    04/16/12  9:33 PM      Result Value Range   Glucose-Capillary 336 (*) 70 - 99 mg/dL   Comment 1 Notify RN     Comment 2 Documented in Chart    CBC     Status: Abnormal   Collection Time   04/17/12  5:00 AM      Result Value Range   WBC 11.5 (*) 4.0 - 10.5 K/uL   RBC 4.29  3.87 - 5.11 MIL/uL   Hemoglobin 12.8  12.0 - 15.0 g/dL   HCT 16.1  09.6 - 04.5 %   MCV 88.1  78.0 - 100.0 fL   MCH 29.8  26.0 - 34.0 pg   MCHC 33.9  30.0 - 36.0 g/dL   RDW 40.9  81.1 - 91.4 %   Platelets 218  150 - 400 K/uL    Intake/Output Summary (Last 24 hours) at 04/17/12 0735 Last data filed at 04/17/12 0546  Gross per 24 hour  Intake 1315.3 ml  Output   3500  ml  Net -2184.7 ml    ASSESSMENT AND PLAN:  ST elevation myocardial infarction (STEMI) of anterior wall:  S/P POBA of RI.  Medical management otherwise.    Diabetes:  Add meal time coverage.    HTN:  Blood pressure is controlled.  Continue current meds.    Transfer to telemetry.        Rollene Rotunda 04/17/2012 7:35 AM

## 2012-04-18 DIAGNOSIS — I10 Essential (primary) hypertension: Secondary | ICD-10-CM

## 2012-04-18 DIAGNOSIS — I2510 Atherosclerotic heart disease of native coronary artery without angina pectoris: Secondary | ICD-10-CM

## 2012-04-18 LAB — CBC
Hemoglobin: 12.7 g/dL (ref 12.0–15.0)
MCHC: 31.9 g/dL (ref 30.0–36.0)
RBC: 4.39 MIL/uL (ref 3.87–5.11)

## 2012-04-18 LAB — GLUCOSE, CAPILLARY
Glucose-Capillary: 279 mg/dL — ABNORMAL HIGH (ref 70–99)
Glucose-Capillary: 281 mg/dL — ABNORMAL HIGH (ref 70–99)

## 2012-04-18 LAB — BASIC METABOLIC PANEL
CO2: 27 mEq/L (ref 19–32)
GFR calc non Af Amer: 79 mL/min — ABNORMAL LOW (ref >90)
Glucose, Bld: 285 mg/dL — ABNORMAL HIGH (ref 70–99)
Potassium: 3.9 mEq/L (ref 3.5–5.1)
Sodium: 138 mEq/L (ref 135–145)

## 2012-04-18 NOTE — Progress Notes (Signed)
PT Cancellation Note  Patient Details Name: Cathy [name] MRN: [id number] DOB: 12/10/1930   Cancelled Treatment:    Reason Eval/Treat Not Completed: Medical issues which prohibited therapy.  Attempted to see patient x2 today.  Patient c/o nausea and declined PT today.  Will return in am for PT evaluation.   Vena Austria 04/18/2012, 2:58 PM [phone]

## 2012-04-18 NOTE — Progress Notes (Signed)
Inpatient Diabetes Program Recommendations  AACE/ADA: New Consensus Statement on Inpatient Glycemic Control (2013)  Target Ranges:  Prepandial:   less than 140 mg/dL      Peak postprandial:   less than 180 mg/dL (1-2 hours)      Critically ill patients:  140 - 180 mg/dL   Reason for Visit: Hyperglycemia  Results for Cathy, [name] (MRN [id number]) as of 04/18/2012 21:07  Ref. Range 04/17/2012 07:44 04/17/2012 12:10 04/17/2012 16:59 04/17/2012 21:33 04/18/2012 07:48 04/18/2012 12:09 04/18/2012 16:37  Glucose-Capillary Latest Range: 70-99 mg/dL 409 (H) 811 (H) 914 (H) 274 (H) 279 (H) 291 (H) 282 (H)  Results for Cathy, [name] (MRN [id number]) as of 04/18/2012 21:07  Ref. Range 04/15/2012 18:00  Hemoglobin A1C Latest Range: <5.7 % 8.5 (H)    Inpatient Diabetes Program Recommendations Insulin - Basal: Increase Lantus to 55 units QHS (Pt on 65 units QHS at home) Insulin - Meal Coverage: Novolog 8 units tidwc if pt eats >50% meal HgbA1C: 8.5% - f/u with PCP for diabetes medication adjustments Diet: CHO mod medium  Thank you. Ailene Ards, RD, LDN, CDE Inpatient Diabetes Coordinator [phone]

## 2012-04-18 NOTE — Progress Notes (Signed)
Primary cardiologist: Dr. Zackery Barefoot  Subjective:   No chest pain. Feels nauseated today. No breathlessness.   Objective:   Temp:  [98.2 F (36.8 C)-99 F (37.2 C)] 98.2 F (36.8 C) (03/30 0500) Pulse Rate:  [85-94] 91 (03/30 0745) Resp:  [18] 18 (03/30 0500) BP: (121-144)/(60-75) 129/60 mmHg (03/30 0942) SpO2:  [96 %-98 %] 98 % (03/30 0500) Weight:  [183 lb 9.6 oz (83.28 kg)] 183 lb 9.6 oz (83.28 kg) (03/30 0500) Last BM Date: 04/14/12  Filed Weights   04/15/12 1900 04/17/12 0442 04/18/12 0500  Weight: 190 lb (86.183 kg) 187 lb 9.8 oz (85.1 kg) 183 lb 9.6 oz (83.28 kg)    Intake/Output Summary (Last 24 hours) at 04/18/12 1146 Last data filed at 04/18/12 1042  Gross per 24 hour  Intake    960 ml  Output   1950 ml  Net   -990 ml   Telemetry: Sinus rhythm.  Exam:  General: NAD.  Lungs: Clear, nonlabored.  Cardiac: RRR, possible soft rub, no gallop.  Extremities: No pitting.   Lab Results:  Basic Metabolic Panel:  Recent Labs Lab 04/15/12 2122 04/16/12 0431 04/17/12 0500 04/18/12 0630  NA 135 136 137 138  K 4.0 4.0 3.9 3.9  CL 99 99 99 100  CO2 20 21 25 27   GLUCOSE 202* 414* 310* 285*  BUN 14 17 24* 21  CREATININE 0.60 0.73 0.84 0.72  CALCIUM 9.7 9.6 9.3 9.4  MG 1.5  --   --   --     Liver Function Tests:  Recent Labs Lab 04/15/12 2122  AST 12  ALT 9  ALKPHOS 62  BILITOT 0.7  PROT 6.5  ALBUMIN 3.2*    CBC:  Recent Labs Lab 04/16/12 0431 04/17/12 0500 04/18/12 0630  WBC 10.8* 11.5* 12.5*  HGB 13.3 12.8 12.7  HCT 39.8 37.8 39.8  MCV 89.0 88.1 90.7  PLT 221 218 208    Cardiac Enzymes:  Recent Labs Lab 04/15/12 1800 04/15/12 2122 04/16/12 0250 04/16/12 0828  CKTOTAL 47  --   --   --   CKMB 2.8  --   --   --   TROPONINI <0.30 0.42* 1.12* 1.58*    Medications:   Scheduled Medications: . aspirin EC  81 mg Oral Daily  . carvedilol  12.5 mg Oral BID WC  . clopidogrel  75 mg Oral Daily  . folic acid  1 mg Oral Daily    . furosemide  40 mg Oral BID  . hydrALAZINE  10 mg Intravenous UD  . insulin aspart  0-20 Units Subcutaneous TID WC  . insulin aspart  4 Units Subcutaneous TID WC  . insulin glargine  40 Units Subcutaneous QHS  . isosorbide mononitrate  30 mg Oral Daily  . levothyroxine  55.5 mcg Oral QAC breakfast  . ramipril  5 mg Oral Daily     Infusions: . sodium chloride 10 mL/hr at 04/16/12 0615     PRN Medications:  acetaminophen, fluticasone, HYDROcodone-acetaminophen, morphine injection, nitroGLYCERIN, ondansetron (ZOFRAN) IV   Assessment:   1. Anterior STEMI s/p PTCA ramus 3/27. Unable to place stent. Plan medical therapy.  2. Multivessel CAD s/p CABG. Patent LIMA to LAD, patent SVG to RCA, SVG to circumflex.  3. HTN.  4. Type 2 diabetes mellitus.  5. Statin intolerance.  6. Nausea, better with Zofran. Recent LFTs normal. Hemodynamically stable.  Plan/Discussion:    Not ready for discharge as yet. Continue medical therapy - now includes aspirin,  Plavix, Coreg, Lasix, hydralazine, Imdur, Altace. Followup echocardiogram to be obtained for assessment of LV function and exclude pericardial effusion.   Jonelle Sidle, M.D., F.A.C.C.

## 2012-04-19 ENCOUNTER — Encounter (HOSPITAL_COMMUNITY): Payer: Self-pay | Admitting: Cardiology

## 2012-04-19 DIAGNOSIS — E039 Hypothyroidism, unspecified: Secondary | ICD-10-CM | POA: Diagnosis present

## 2012-04-19 DIAGNOSIS — I517 Cardiomegaly: Secondary | ICD-10-CM

## 2012-04-19 LAB — HEPATIC FUNCTION PANEL
Alkaline Phosphatase: 80 U/L (ref 39–117)
Bilirubin, Direct: 0.1 mg/dL (ref 0.0–0.3)
Indirect Bilirubin: 0.4 mg/dL (ref 0.3–0.9)
Total Bilirubin: 0.5 mg/dL (ref 0.3–1.2)
Total Protein: 6.8 g/dL (ref 6.0–8.3)

## 2012-04-19 LAB — CBC
MCHC: 33.5 g/dL (ref 30.0–36.0)
MCV: 88.4 fL (ref 78.0–100.0)
Platelets: 206 10*3/uL (ref 150–400)
RDW: 14.1 % (ref 11.5–15.5)
WBC: 12.7 10*3/uL — ABNORMAL HIGH (ref 4.0–10.5)

## 2012-04-19 LAB — GLUCOSE, CAPILLARY: Glucose-Capillary: 279 mg/dL — ABNORMAL HIGH (ref 70–99)

## 2012-04-19 LAB — BASIC METABOLIC PANEL
Calcium: 9.3 mg/dL (ref 8.4–10.5)
Chloride: 99 mEq/L (ref 96–112)
Creatinine, Ser: 0.77 mg/dL (ref 0.50–1.10)
GFR calc Af Amer: 90 mL/min — ABNORMAL LOW (ref >90)
GFR calc non Af Amer: 77 mL/min — ABNORMAL LOW (ref >90)

## 2012-04-19 MED ORDER — LEVOTHYROXINE SODIUM 25 MCG PO TABS
25.0000 ug | ORAL_TABLET | Freq: Every day | ORAL | Status: DC
  Administered 2012-04-20 – 2012-04-21 (×2): 25 ug via ORAL
  Filled 2012-04-19 (×3): qty 1

## 2012-04-19 NOTE — Progress Notes (Signed)
1478  Came to see pt. PT getting ready to work with pt. We will follow up this afternoon Luetta Nutting RN BSN

## 2012-04-19 NOTE — Progress Notes (Signed)
CARDIAC REHAB PHASE I   PRE:  Rate/Rhythm: 90SR  BP:  Supine:   Sitting: 118/60  Standing:    SaO2: 95%RA  MODE:  Ambulation: 84 ft   POST:  Rate/Rhythm: 98  BP:  Supine: 138/70  Sitting:   Standing:    SaO2: 96%RA 1330-1410 Pt states she is starving and not getting enough food. Had a salad for lunch. Notified pt's RN that pt stated she eats a lot at home. Pt walked 84 ft on RA with gait belt and rolling walker with fairly steady gait. Tolerated well without CP. Reviewed NTG use.   Luetta Nutting, RN BSN  04/19/2012 2:05 PM

## 2012-04-19 NOTE — Evaluation (Signed)
Physical Therapy Evaluation Patient Details Name: Cathy [name] MRN: [id number] DOB: 1930-07-02 Today's Date: 04/19/2012 Time: 4098-1191 PT Time Calculation (min): 30 min  PT Assessment / Plan / Recommendation Clinical Impression  Pt adm with STEMI.  Pt with low functional activity tolerance currently that will make it difficult for pt to return home alone at this time.  Needs skilled PT to maximize I and safety so pt can eventually return home.  Recommend ST-SNF.    PT Assessment  Patient needs continued PT services    Follow Up Recommendations  SNF    Does the patient have the potential to tolerate intense rehabilitation      Barriers to Discharge Decreased caregiver support      Equipment Recommendations  None recommended by PT    Recommendations for Other Services OT consult   Frequency Min 3X/week    Precautions / Restrictions Precautions Precautions: Fall Restrictions Weight Bearing Restrictions: No   Pertinent Vitals/Pain RHR 89. EHR 105.      Mobility  Bed Mobility Bed Mobility: Supine to Sit;Sitting - Scoot to Edge of Bed Supine to Sit: 5: Supervision;HOB elevated Sitting - Scoot to Edge of Bed: 5: Supervision Details for Bed Mobility Assistance: Incr time and effort Transfers Transfers: Sit to Stand;Stand to [employer] Transfers Sit to Stand: 4: Min assist;With upper extremity assist;With armrests;From bed;From chair/3-in-1 Stand to Sit: 4: Min assist;With upper extremity assist;With armrests;To bed;To chair/3-in-1 Stand Pivot Transfers: 4: Min assist Details for Transfer Assistance: Bed to [facility] with stand pivot.  Assist for balance. Ambulation/Gait Ambulation/Gait Assistance: 4: Min assist Ambulation Distance (Feet): 75 Feet Assistive device: Rolling walker Ambulation/Gait Assistance Details: Verbal cues to stay closer to walker and to stand more erect. Gait Pattern: Step-through pattern;Decreased stride length;Trunk flexed;Decreased step length -  right;Decreased step length - left Gait velocity: slow    Exercises     PT Diagnosis: Difficulty walking;Generalized weakness  PT Problem List: Decreased strength;Decreased activity tolerance;Decreased balance;Decreased mobility;Decreased knowledge of precautions PT Treatment Interventions: DME instruction;Gait training;Patient/family education;Functional mobility training;Therapeutic activities;Therapeutic exercise;Balance training   PT Goals Acute Rehab PT Goals PT Goal Formulation: With patient Time For Goal Achievement: 05/03/12 Potential to Achieve Goals: Good Pt will go Supine/Side to Sit: with modified independence PT Goal: Supine/Side to Sit - Progress: Goal set today Pt will go Sit to Supine/Side: with modified independence PT Goal: Sit to Supine/Side - Progress: Goal set today Pt will go Sit to Stand: with modified independence PT Goal: Sit to Stand - Progress: Goal set today Pt will go Stand to Sit: with modified independence PT Goal: Stand to Sit - Progress: Goal set today Pt will Ambulate: 51 - 150 feet;with modified independence;with least restrictive assistive device PT Goal: Ambulate - Progress: Goal set today  Visit Information  Last PT Received On: 04/19/12 Assistance Needed: +1    Subjective Data  Subjective: Pt states she is weak. Patient Stated Goal: Return home   Prior Functioning  Home Living Lives With: Alone Available Help at Discharge: Family;Friend(s);Available PRN/intermittently Type of Home: House Home Access: Stairs to enter Entrance Stairs-Number of Steps: 1 Home Layout: One level Bathroom Shower/Tub: Engineer, manufacturing systems: Standard Home Adaptive Equipment: Bedside commode/3-in-1;Shower chair with back;Raised toilet seat with rails;Walker - standard;Straight cane;Wheelchair - [employer] - four wheeled Additional Comments: Only amb in house with cane.  Doesn't go out. Prior Function Level of Independence: Needs assistance Needs  Assistance: Bathing;Light Housekeeping Bath: Minimal Light Housekeeping: Total Driving: No Vocation: Retired Musician: No difficulties  Cognition  Cognition Overall Cognitive Status: Appears within functional limits for tasks assessed/performed Arousal/Alertness: Awake/alert Orientation Level: Appears intact for tasks assessed Behavior During Session: [facility] for tasks performed    Extremity/Trunk Assessment Right Lower Extremity Assessment RLE ROM/Strength/Tone: Deficits RLE ROM/Strength/Tone Deficits: grossly 4/5 Left Lower Extremity Assessment LLE ROM/Strength/Tone: Deficits LLE ROM/Strength/Tone Deficits: grossly 4/5   Balance Balance Balance Assessed: Yes Static Standing Balance Static Standing - Balance Support: Left upper extremity supported;During functional activity Static Standing - Level of Assistance: 4: Min assist  End of Session PT - End of Session Equipment Utilized During Treatment: Gait belt Activity Tolerance: Patient limited by fatigue Patient left: in chair;with call bell/phone within reach Nurse Communication: Mobility status  GP     [facility] 04/19/2012, 9:42 AM  [address] [employer] [facility] PT [phone]

## 2012-04-19 NOTE — Progress Notes (Addendum)
Patient ID: Cathy [name], female   DOB: Mar 21, 1930, 79 y.o.   MRN: [id number]   SUBJECTIVE:  The patient is recovering slowly from her MI. Yesterday she continued to have nausea. She was also weak and did not ambulate. She rested a little better during the night. She scheduled a have a 2-D echo today. I am hopeful that she'll have a better today with less nausea and better ability to ambulate.  The patient has diabetes with gastroparesis. This may be the basis of her nausea. We will check LFTs.   Filed Vitals:   04/18/12 1356 04/18/12 1723 04/18/12 2100 04/19/12 0500  BP: 116/70 117/76 127/75 138/81  Pulse: 91 100 89 87  Temp: 97.7 F (36.5 C)  98.4 F (36.9 C) 98.7 F (37.1 C)  TempSrc: Oral  Oral Oral  Resp: 18  18 18   Height:      Weight:    185 lb 1.6 oz (83.961 kg)  SpO2: 100%  97% 93%    Intake/Output Summary (Last 24 hours) at 04/19/12 0806 Last data filed at 04/18/12 1042  Gross per 24 hour  Intake      0 ml  Output    250 ml  Net   -250 ml    LABS: Basic Metabolic Panel:  Recent Labs  91/47/82 0630 04/19/12 0530  NA 138 137  K 3.9 4.1  CL 100 99  CO2 27 26  GLUCOSE 285* 286*  BUN 21 21  CREATININE 0.72 0.77  CALCIUM 9.4 9.3   Liver Function Tests: No results found for this basename: AST, ALT, ALKPHOS, BILITOT, PROT, ALBUMIN,  in the last 72 hours No results found for this basename: LIPASE, AMYLASE,  in the last 72 hours CBC:  Recent Labs  04/18/12 0630 04/19/12 0530  WBC 12.5* 12.7*  HGB 12.7 13.0  HCT 39.8 38.8  MCV 90.7 88.4  PLT 208 206   Cardiac Enzymes:  Recent Labs  04/16/12 0828  TROPONINI 1.58*   BNP: No components found with this basename: POCBNP,  D-Dimer: No results found for this basename: DDIMER,  in the last 72 hours Hemoglobin A1C: No results found for this basename: HGBA1C,  in the last 72 hours Fasting Lipid Panel: No results found for this basename: CHOL, HDL, LDLCALC, TRIG, CHOLHDL, LDLDIRECT,  in the last 72  hours Thyroid Function Tests:  Recent Labs  04/16/12 0828  TSH 0.110*    RADIOLOGY: Mr Laqueta Jean NF Contrast  03/29/2012  *RADIOLOGY REPORT*  Clinical Data: Blurred vision and headaches for past couple months. No recent injury.  Diabetic hypertensive patient with hyperlipidemia.  MRI HEAD WITHOUT AND WITH CONTRAST  Technique:  Multiplanar, multiecho pulse sequences of the brain and surrounding structures were obtained according to standard protocol without and with intravenous contrast  Contrast: 15mL MULTIHANCE GADOBENATE DIMEGLUMINE 529 MG/ML IV SOLN  Comparison: 09/20/2011 CT.  02/10/2005 MR.  Findings: No acute infarct.  Area of blood breakdown products superior left cerebellum may be related to remote trauma, hemorrhage or hemorrhagic ischemia.  No intracranial mass or abnormal enhancement.  No hydrocephalus.  Remote infarcts basal ganglia and thalamus bilaterally.  Moderate small vessel disease type changes.  Global atrophy without hydrocephalus.  Major intracranial vascular structures are patent. Ectatic vertebral arteries and basilar artery with atherosclerotic type changes right vertebral artery suspected.  Cervical medullary junction, pituitary region, pineal region and orbital structures unremarkable.  Minimal paranasal sinus mucosal thickening.  IMPRESSION: No acute infarct.  Area of blood breakdown products  superior left cerebellum may be related to remote trauma, hemorrhage or hemorrhagic ischemia.  No intracranial mass or abnormal enhancement.  Remote infarcts basal ganglia and thalamus bilaterally.  Moderate small vessel disease type changes.  Global atrophy without hydrocephalus.  Major intracranial vascular structures are patent. Ectatic vertebral arteries and basilar artery with atherosclerotic type changes right vertebral artery suspected.   Original Report Authenticated By: Lacy Duverney, M.D.    Dg Chest Port 1 View  04/16/2012  *RADIOLOGY REPORT*  Clinical Data: STEMI, shortness of  breath  PORTABLE CHEST - 1 VIEW  Comparison: 10/24/2011  Findings: Postsurgical changes are again noted.  The cardiac shadow is within normal limits.  The lungs are clear bilaterally.  No acute bony abnormality is seen.  IMPRESSION: No acute abnormality noted.   Original Report Authenticated By: Alcide Clever, M.D.     PHYSICAL EXAM   Patient is oriented to person time and place. Affect is normal. She's just waking up this morning. There is no jugulovenous distention. Lungs are clear. Respiratory effort is nonlabored. Cardiac exam rales S1 and S2. The abdomen is soft. There is no significant peripheral edema.   TELEMETRY: I have reviewed telemetry today April 19, 2012. There is normal sinus rhythm.   ASSESSMENT AND PLAN:    Cardiomyopathy      Patient currently is on the appropriate medications.    Type II or unspecified type diabetes mellitus without mention of complication, not stated as uncontrolled            This is being treated.    ST elevation myocardial infarction (STEMI) of anterior wall           The patient had an intervention this admission. She continues to recover. She's not having any recurrent chest discomfort. She is weak. We need to continue with her rehabilitation.  Nausea Gastroparesis    Her nausea at this time may be related to gastroparesis. I have ordered LFTs.  Hypothyroidism.   The patient was on 56 mcg of Synthroid ( one half of a 112 mcg tablet) daily. TSH was low. Free T4 was normal. I have still decided to lower her Synthroid to 25 mcg daily.  [name] 04/19/2012 8:06 AM

## 2012-04-20 LAB — GLUCOSE, CAPILLARY
Glucose-Capillary: 292 mg/dL — ABNORMAL HIGH (ref 70–99)
Glucose-Capillary: 232 mg/dL — ABNORMAL HIGH (ref 70–99)

## 2012-04-20 LAB — URINALYSIS, ROUTINE W REFLEX MICROSCOPIC
Bilirubin Urine: NEGATIVE
Nitrite: POSITIVE — AB
Specific Gravity, Urine: 1.015 (ref 1.005–1.030)
pH: 6 (ref 5.0–8.0)

## 2012-04-20 LAB — URINE MICROSCOPIC-ADD ON

## 2012-04-20 LAB — CBC
MCH: 29.5 pg (ref 26.0–34.0)
MCHC: 33.5 g/dL (ref 30.0–36.0)
Platelets: 219 10*3/uL (ref 150–400)
RDW: 14.1 % (ref 11.5–15.5)

## 2012-04-20 LAB — BASIC METABOLIC PANEL
BUN: 21 mg/dL (ref 6–23)
Calcium: 9.4 mg/dL (ref 8.4–10.5)
Chloride: 99 mEq/L (ref 96–112)
Creatinine, Ser: 0.74 mg/dL (ref 0.50–1.10)
GFR calc Af Amer: >90 mL/min (ref >90)
GFR calc non Af Amer: 78 mL/min — ABNORMAL LOW (ref >90)

## 2012-04-20 NOTE — Progress Notes (Signed)
Patient ID: Cathy [name], female   DOB: 1930-05-31, 79 y.o.   MRN: [id number]   SUBJECTIVE:  Patient is continuing to get stronger after her MI. She did not have significant nausea yesterday. She admits that she has this intermittently at home. We know that she has gastroparesis. She ambulated better yesterday   Filed Vitals:   04/19/12 0500 04/19/12 1409 04/19/12 2100 04/20/12 0548  BP: 138/81 114/61 117/76 145/85  Pulse: 87 98 90 82  Temp: 98.7 F (37.1 C) 98.6 F (37 C) 98.4 F (36.9 C) 97.7 F (36.5 C)  TempSrc: Oral Oral Oral Oral  Resp: 18 20 18 18   Height:      Weight: 185 lb 1.6 oz (83.961 kg)   184 lb 4.8 oz (83.598 kg)  SpO2: 93% 95% 97% 94%    Intake/Output Summary (Last 24 hours) at 04/20/12 0858 Last data filed at 04/19/12 1300  Gross per 24 hour  Intake    360 ml  Output      0 ml  Net    360 ml    LABS: Basic Metabolic Panel:  Recent Labs  40/98/11 0530 04/20/12 0550  NA 137 136  K 4.1 4.1  CL 99 99  CO2 26 27  GLUCOSE 286* 318*  BUN 21 21  CREATININE 0.77 0.74  CALCIUM 9.3 9.4   Liver Function Tests:  Recent Labs  04/19/12 0920  AST 11  ALT 10  ALKPHOS 80  BILITOT 0.5  PROT 6.8  ALBUMIN 3.1*   No results found for this basename: LIPASE, AMYLASE,  in the last 72 hours CBC:  Recent Labs  04/19/12 0530 04/20/12 0550  WBC 12.7* 12.3*  HGB 13.0 12.8  HCT 38.8 38.2  MCV 88.4 88.0  PLT 206 219   Cardiac Enzymes: No results found for this basename: CKTOTAL, CKMB, CKMBINDEX, TROPONINI,  in the last 72 hours BNP: No components found with this basename: POCBNP,  D-Dimer: No results found for this basename: DDIMER,  in the last 72 hours Hemoglobin A1C: No results found for this basename: HGBA1C,  in the last 72 hours Fasting Lipid Panel: No results found for this basename: CHOL, HDL, LDLCALC, TRIG, CHOLHDL, LDLDIRECT,  in the last 72 hours Thyroid Function Tests: No results found for this basename: TSH, T4TOTAL, FREET3, T3FREE,  THYROIDAB,  in the last 72 hours  RADIOLOGY: Mr Laqueta Jean Wo Contrast  03/29/2012  *RADIOLOGY REPORT*  Clinical Data: Blurred vision and headaches for past couple months. No recent injury.  Diabetic hypertensive patient with hyperlipidemia.  MRI HEAD WITHOUT AND WITH CONTRAST  Technique:  Multiplanar, multiecho pulse sequences of the brain and surrounding structures were obtained according to standard protocol without and with intravenous contrast  Contrast: 15mL MULTIHANCE GADOBENATE DIMEGLUMINE 529 MG/ML IV SOLN  Comparison: 09/20/2011 CT.  02/10/2005 MR.  Findings: No acute infarct.  Area of blood breakdown products superior left cerebellum may be related to remote trauma, hemorrhage or hemorrhagic ischemia.  No intracranial mass or abnormal enhancement.  No hydrocephalus.  Remote infarcts basal ganglia and thalamus bilaterally.  Moderate small vessel disease type changes.  Global atrophy without hydrocephalus.  Major intracranial vascular structures are patent. Ectatic vertebral arteries and basilar artery with atherosclerotic type changes right vertebral artery suspected.  Cervical medullary junction, pituitary region, pineal region and orbital structures unremarkable.  Minimal paranasal sinus mucosal thickening.  IMPRESSION: No acute infarct.  Area of blood breakdown products superior left cerebellum may be related to remote trauma, hemorrhage or  hemorrhagic ischemia.  No intracranial mass or abnormal enhancement.  Remote infarcts basal ganglia and thalamus bilaterally.  Moderate small vessel disease type changes.  Global atrophy without hydrocephalus.  Major intracranial vascular structures are patent. Ectatic vertebral arteries and basilar artery with atherosclerotic type changes right vertebral artery suspected.   Original Report Authenticated By: Lacy Duverney, M.D.    Dg Chest Port 1 View  04/16/2012  *RADIOLOGY REPORT*  Clinical Data: STEMI, shortness of breath  PORTABLE CHEST - 1 VIEW  Comparison:  10/24/2011  Findings: Postsurgical changes are again noted.  The cardiac shadow is within normal limits.  The lungs are clear bilaterally.  No acute bony abnormality is seen.  IMPRESSION: No acute abnormality noted.   Original Report Authenticated By: Alcide Clever, M.D.     PHYSICAL EXAM  Patient is oriented to person time and place. Affect is normal. There is no jugulovenous distention. Lungs reveal a few scattered rhonchi. There is no respiratory distress. Cardiac exam reveals S1 and S2. There no clicks or significant murmurs. Abdomen is soft. There's no peripheral edema.   TELEMETRY: I have reviewed telemetry today April 20, 2012. There is normal sinus rhythm.   ASSESSMENT AND PLAN:    Gastroparesis     This is a chronic problem and it is stable today. No further workup.    Cardiomyopathy     She is on appropriate medications. No change.    Type II or unspecified type diabetes mellitus       Treatment is ongoing     ST elevation myocardial infarction (STEMI) of anterior wall     This was the main issue leading to hospitalization. She continues to get stronger. The plan will be to increase ambulation today. She will be making arrangements to be able to go home tomorrow after lunch.I   [name] 04/20/2012 8:58 AM

## 2012-04-20 NOTE — Progress Notes (Signed)
Inpatient Diabetes Program Recommendations  AACE/ADA: New Consensus Statement on Inpatient Glycemic Control (2013)  Target Ranges:  Prepandial:   less than 140 mg/dL      Peak postprandial:   less than 180 mg/dL (1-2 hours)      Critically ill patients:  140 - 180 mg/dL    Results for Cathy [name], Cathy [name] (MRN [id number]) as of 04/20/2012 11:04  Ref. Range 04/19/2012 07:20 04/19/2012 11:39 04/19/2012 16:50 04/19/2012 21:11  Glucose-Capillary Latest Range: 70-99 mg/dL 696 (H) 295 (H) 284 (H) 295 (H)    Results for Cathy [name], Cathy [name] (MRN [id number]) as of 04/20/2012 11:04  Ref. Range 04/20/2012 07:35  Glucose-Capillary Latest Range: 70-99 mg/dL 725 (H)    MD- Please increase Lantus to 65 units QHS (home dose)- CBGs still elevated.   Will follow. Ambrose Finland RN, MSN, CDE Diabetes Coordinator Inpatient Diabetes Program [phone]

## 2012-04-20 NOTE — Progress Notes (Signed)
CARDIAC REHAB PHASE I   PRE:  Rate/Rhythm: 85 SR  BP:  Supine:   Sitting: 106/60  Standing:    SaO2: 95 RA  MODE:  Ambulation: 88 ft   POST:  Rate/Rhythm: 96  BP:  Supine:   Sitting: 132/62  Standing:    SaO2: 96 RA 1130-1205 Assisted X 1 used walker and gait belt to ambulate. Pt tires easily and is weak. Able to walk 88 feet, pt exhausted by end of walk. Pt requested to go back to bed after walk states that she was tired from no sleep last night. Discussed rehab with pt, she is agreeable to this plan.Discussed with social worker and she going to start working on this plan.  Melina Copa RN 04/20/2012 12:12 PM

## 2012-04-21 ENCOUNTER — Encounter (HOSPITAL_COMMUNITY): Payer: Self-pay | Admitting: Physician Assistant

## 2012-04-21 ENCOUNTER — Telehealth: Payer: Self-pay | Admitting: *Deleted

## 2012-04-21 DIAGNOSIS — N390 Urinary tract infection, site not specified: Secondary | ICD-10-CM

## 2012-04-21 DIAGNOSIS — I2589 Other forms of chronic ischemic heart disease: Secondary | ICD-10-CM

## 2012-04-21 LAB — GLUCOSE, CAPILLARY
Glucose-Capillary: 330 mg/dL — ABNORMAL HIGH (ref 70–99)
Glucose-Capillary: 260 mg/dL — ABNORMAL HIGH (ref 70–99)

## 2012-04-21 LAB — CBC
HCT: 38.5 % (ref 36.0–46.0)
Hemoglobin: 13.1 g/dL (ref 12.0–15.0)
MCH: 29.9 pg (ref 26.0–34.0)
RBC: 4.38 MIL/uL (ref 3.87–5.11)

## 2012-04-21 MED ORDER — CIPROFLOXACIN HCL 500 MG PO TABS: 500.0000 mg | ORAL_TABLET | Freq: Two times a day (BID) | ORAL | Status: DC

## 2012-04-21 MED ORDER — CLOPIDOGREL BISULFATE 75 MG PO TABS: 75.0000 mg | ORAL_TABLET | Freq: Every day | ORAL | Status: DC

## 2012-04-21 MED ORDER — CIPROFLOXACIN HCL 500 MG PO TABS
500.0000 mg | ORAL_TABLET | Freq: Two times a day (BID) | ORAL | Status: DC
  Administered 2012-04-21: 500 mg via ORAL
  Filled 2012-04-21 (×3): qty 1

## 2012-04-21 NOTE — Progress Notes (Signed)
Clinical Social Work Department BRIEF PSYCHOSOCIAL ASSESSMENT 04/21/2012  Patient:  Cathy [name], Cathy [name]     Account Number:  [id number]     Admit date:  04/15/2012  Clinical Social Worker:  Kirke Shaggy  Date/Time:  04/21/2012 12:26 PM  Referred by:  RN  Date Referred:  04/20/2012 Referred for  SNF Placement   Other Referral:   Interview type:  Patient Other interview type:    PSYCHOSOCIAL DATA Living Status:  ALONE Admitted from facility:   Level of care:   Primary support name:  Verneita Griffes Primary support relationship to patient:  CHILD, ADULT Degree of support available:   good    CURRENT CONCERNS Current Concerns  Post-Acute Placement   Other Concerns:    SOCIAL WORK ASSESSMENT / PLAN Met with pt at bedside to assess need for SNF placement. Pt meets criteria for placement. Pt is in agreement for SNF placement up in [place] area where her son lives.  CSW called Son Verneita Griffes [phone] who is in agreement for SNF placement in [place], [place]. Son suggests [facility].   Assessment/plan status:   Other assessment/ plan:   Information/referral to community resources:    PATIENT'S/FAMILY'S RESPONSE TO PLAN OF CARE: Pt and son are in agreement for SNF placement for [facility].  [facility] will pick up the pt today for transfer.  No O2 is needed. CSW is done with this pt's needs.   Sherald Barge, LCSW-A Clinical Social Worker [phone]

## 2012-04-21 NOTE — Progress Notes (Signed)
Patient ID: Cathy [name], female   DOB: [birth year], 80 y.o.   MRN: [id number]   SUBJECTIVE:  Patient is continuing to get stronger after her MI. She denies chest pain or dyspnea.   Filed Vitals:   04/20/12 0548 04/20/12 1400 04/20/12 2029 04/21/12 0627  BP: 145/85 148/73 151/73 141/57  Pulse: 82 89 91 82  Temp: 97.7 F (36.5 C) 98 F (36.7 C) 99.3 F (37.4 C) 99.2 F (37.3 C)  TempSrc: Oral  Oral Oral  Resp: 18 17 18    Height:      Weight: 83.598 kg (184 lb 4.8 oz)   82.101 kg (181 lb)  SpO2: 94% 94% 94% 95%    Intake/Output Summary (Last 24 hours) at 04/21/12 0759 Last data filed at 04/20/12 2336  Gross per 24 hour  Intake    720 ml  Output   2450 ml  Net  -1730 ml    LABS: Basic Metabolic Panel:  Recent Labs  27/25/36 0530 04/20/12 0550  NA 137 136  K 4.1 4.1  CL 99 99  CO2 26 27  GLUCOSE 286* 318*  BUN 21 21  CREATININE 0.77 0.74  CALCIUM 9.3 9.4   Liver Function Tests:  Recent Labs  04/19/12 0920  AST 11  ALT 10  ALKPHOS 80  BILITOT 0.5  PROT 6.8  ALBUMIN 3.1*   No results found for this basename: LIPASE, AMYLASE,  in the last 72 hours CBC:  Recent Labs  04/20/12 0550 04/21/12 0535  WBC 12.3* 10.9*  HGB 12.8 13.1  HCT 38.2 38.5  MCV 88.0 87.9  PLT 219 229   Cardiac Enzymes: No results found for this basename: CKTOTAL, CKMB, CKMBINDEX, TROPONINI,  in the last 72 hours BNP: No components found with this basename: POCBNP,  D-Dimer: No results found for this basename: DDIMER,  in the last 72 hours Hemoglobin A1C: No results found for this basename: HGBA1C,  in the last 72 hours Fasting Lipid Panel: No results found for this basename: CHOL, HDL, LDLCALC, TRIG, CHOLHDL, LDLDIRECT,  in the last 72 hours Thyroid Function Tests: No results found for this basename: TSH, T4TOTAL, FREET3, T3FREE, THYROIDAB,  in the last 72 hours  RADIOLOGY: Mr Laqueta Jean Wo Contrast  03/29/2012  *RADIOLOGY REPORT*  Clinical Data: Blurred vision and headaches  for past couple months. No recent injury.  Diabetic hypertensive patient with hyperlipidemia.  MRI HEAD WITHOUT AND WITH CONTRAST  Technique:  Multiplanar, multiecho pulse sequences of the brain and surrounding structures were obtained according to standard protocol without and with intravenous contrast  Contrast: 15mL MULTIHANCE GADOBENATE DIMEGLUMINE 529 MG/ML IV SOLN  Comparison: 09/20/2011 CT.  02/10/2005 MR.  Findings: No acute infarct.  Area of blood breakdown products superior left cerebellum may be related to remote trauma, hemorrhage or hemorrhagic ischemia.  No intracranial mass or abnormal enhancement.  No hydrocephalus.  Remote infarcts basal ganglia and thalamus bilaterally.  Moderate small vessel disease type changes.  Global atrophy without hydrocephalus.  Major intracranial vascular structures are patent. Ectatic vertebral arteries and basilar artery with atherosclerotic type changes right vertebral artery suspected.  Cervical medullary junction, pituitary region, pineal region and orbital structures unremarkable.  Minimal paranasal sinus mucosal thickening.  IMPRESSION: No acute infarct.  Area of blood breakdown products superior left cerebellum may be related to remote trauma, hemorrhage or hemorrhagic ischemia.  No intracranial mass or abnormal enhancement.  Remote infarcts basal ganglia and thalamus bilaterally.  Moderate small vessel disease type changes.  Global atrophy  without hydrocephalus.  Major intracranial vascular structures are patent. Ectatic vertebral arteries and basilar artery with atherosclerotic type changes right vertebral artery suspected.   Original Report Authenticated By: Lacy Duverney, M.D.      PHYSICAL EXAM  Patient is oriented to person time and place. Affect is normal. There is no jugulovenous distention. Lungs reveal a few scattered rhonchi. There is no respiratory distress. Cardiac exam reveals S1 and S2. There no clicks or significant murmurs. Abdomen is soft.  There's no peripheral edema.   TELEMETRY: I have reviewed telemetry today April 21, 2012. There is normal sinus rhythm.   ASSESSMENT AND PLAN:     ST elevation myocardial infarction (STEMI) of anterior wall. S/p PCI of ramus without stent placement.      This was the main issue leading to hospitalization. She continues to get stronger. Can likely discharge today. On current medications.     Gastroparesis     This is a chronic problem and it is stable today. No further workup.    Cardiomyopathy     She is on appropriate medications. No change.    Type II or unspecified type diabetes mellitus       Treatment is ongoing   Lorine Bears 04/21/2012 7:59 AM

## 2012-04-21 NOTE — Telephone Encounter (Signed)
EPH scheduled for Monday, 04/26/2012 at 2:20 with Gene Serpe, PA.

## 2012-04-21 NOTE — Telephone Encounter (Signed)
Message copied by Lesle Chris on Wed Apr 21, 2012  9:56 AM ------      Message from: Burnice Logan      Created: Wed Apr 21, 2012  9:01 AM      Regarding: TCM follow up in 1 week       TCM ------

## 2012-04-21 NOTE — Discharge Summary (Addendum)
Discharge Summary   Patient ID: Cathy [name],  MRN: [id number], DOB/AGE: November 10, 1930 79 y.o.  Admit date: 04/15/2012 Discharge date: 04/21/2012  Primary Physician: Milinda Antis, MD Primary Cardiologist: Lovena Neighbours, MD  Discharge Diagnoses Principal Problem:   ST elevation myocardial infarction (STEMI) of anterior wall  - S/p cardiac catheterization + PTCA only: 60-70% proximal LAD, 95% mid AV groove circumflex s/p PTCA, proximal RCA occlusion, patent LIMA to LAD, patent SVG to distal RCA, chronically occluded SVG to circumflex; LVEF 25-30%  - DAPT- ASA/Plavix  Active Problems:   Hypertension   Hyperlipidemia   S/P CABG (coronary artery bypass graft) and ascending aortic grafting in 2003   Gastroparesis   Allergy history, radiographic dye   Statin intolerance  - TG 555, LDL incalculable, TC 218, HDL 34  - Unclear reaction to atorvastatin, patient states it "makes me sick"  - Refused Lovaza due to previous advice not to take if concomitant dye allergy   - To be further addressed on follow-up   Type II or unspecified type diabetes mellitus without mention of complication, not stated as uncontrolled  - Hgb A1C 8.5%   Hypothyroidism  - TSH 0.110  - Free T4 1.72   Cardiomyopathy, ischemic  - Echo 04/19/12: EF 25-30%, mod LVH, diffuse HK, trivial AI   UTI (urinary tract infection)  - Nitrites and leukocytes on u/a  - Urine culture pending currently  - Started empirically on ciprofloxacin  Allergies Allergies  Allergen Reactions  . Atorvastatin Other (See Comments)    Unknown-patient admitted to hospital after taking  . Citrus Dermatitis    BREAK OUT ON BODY  . Iodine   . Iohexol      Desc: ANAPHYLAXIS   . Zolpidem Tartrate     REACTION: hallucinations  . Milk-Related Compounds Rash   Diagnostic Studies/Procedures  CARDIAC CATHETERIZATION + PTCA WITHOUT STENT PLACEMENT - 04/15/12  AO SYSTOLIC/AO DIASTOLIC: 145/64  LV SYSTOLIC/LV DIASTOLIC: 145/26  ANGIOGRAPHIC  RESULTS:  1. Left main; normal  2. LAD; patent stent in the proximal portion, 60-70% segmental proximal with competitive flow distally at the diagonal branch  3. Left circumflex; the native circumflex is occluded in the AV groove. It gave off a large first obtuse marginal branch and the ramus is distribution and a 95% stenosis in the midportion on a "hairpin turn"..  4. Right coronary artery; occluded proximally  5.LIMA TO LAD; widely patent with a severely and diffusely diseased the distal and apical LAD  6. SVG TO distal right coronary artery is widely patent  SVG TO circumflex was occluded at the origin which was an old finding  7. Left ventriculography; RAO left ventriculogram was performed using  25 mL of Visipaque dye at 12 mL/second. The overall LVEF estimated  25-30 % Without wall motion abnormalities  IMPRESSION:Cathy [name] has a 95% ramus branch stenosis representing her "culprit lesion. Her anatomy otherwise is unchanged from her prior cath performed by Dr. Bonnee Quin in 2011.we'll proceed with attempt at PCI and stenting of the ramus branch.  Final impression: Successful PTCA of a high-grade ramus branch lesion with a suboptimal angiographic result, adn unsuccessful stenting because of tortuous anatomy. The guidewire and catheter were removed and the sheath was then secured in place. The patient left the cath lab he was stable on IV heparin and reduce dose Angiomax . I discussed this with Dr. Simona Huh, the attending on call for the patient tonight. We will consider reattempt intervention tomorrow.  PORTABLE CHEST X-RAY -  04/16/12  IMPRESSION:  No acute abnormality noted.  TRANSTHORACIC ECHOCARDIOGRAM - 04/19/12  - Left ventricle: The cavity size was mildly dilated. Wall thickness was increased in a pattern of moderate LVH. Systolic function was moderately to severely reduced. The estimated ejection fraction was in the range of 30% to 35%. Diffuse hypokinesis. There was an  increased relative contribution of atrial contraction to ventricular filling.  - Aortic valve: Trivial regurgitation.  History of Present Illness Cathy [name] is a 79 y.o. female who is admitted to [facility] on 04/15/12 with the above problem list.  She has a history of CAD status post CABG with known occlusion of the SVG to OM by cath in 2006. She also has a prior history of an ascending aortic aneurysm status post aortic grafting at the time of CABG in 2003. In addition, she has several cardiac risk factors as noted above. She was in her usual state of health the morning of admission when she began to experience substernal chest pain with associated shortness of breath unrelieved after one sublingual nitroglycerin and Tylenol. She was apparently advised to present to the ED but decline. After experiencing a second episode of chest pain that afternoon, she presented to her PCPs office where an EKG was performed and revealed anterior ST elevation. EMS was called and code STEMI was activated.  Hospital Course  As above, she underwent emergent cardiac catheterization. She received angioplasty alone of a high-grade ramus branch a suboptimal result unsuccessful stenting because of tortuous anatomy. There is consideration to pursue repeat PCI following day. She is continued on aspirin, started on Brilinta he continued on beta blocker. Given a history of statin intolerance, this was not started. The following morning, the patient's history was further reviewed which included a brain MRI performed last month which revealed questionable hemorrhagic changes. Given this finding, Brilinta was replaced with Plavix.   Upon further questioning of her statin intolerance, she reported that atorvastatin "makes me sick." She also apparently been advised not to take fish oil due to a dye allergy. She did not endorse any fish or shellfish allergy to correlate with the true adverse reaction. Hence, statin was not  initiated and Lovaza was held. TSH was drawn which returned mildly suppressed. Hemoglobin A1c returned at 8.5% indicating poor control. A lipid panel indicated hypertriglyceridemia and as result LDL is unable to be calculated. Given the complexity of her lesions in coronary anatomy, the decision was made to treat medically.  She gradually recovered and ambulated well with cardiac rehabilitation. A 2D echocardiogram was performed which, as above, demonstrated EF 25-30%, mod LVH, diffuse HK, trivial AI. She remained euvolemic. She did develop nausea and a mild leukocytosis. Urinalysis indicated an underlying UTI. Urine culture was drawn and results are pending at the time of this discharge summary. She has been started empirically on ciprofloxacin. Antibiotic therapy may be changed post discharge once urine culture and sensitivity data is available.  She was evaluated by Dr. Kirke Corin this morning who deemed her stable for discharge. She will followup in 7 days given post-STEMI status. She will be discharged on the medication regimen listed below. Lipid management and appropriate therapy will be revisited on follow-up. Social work consult has been requested to pursue skilled nursing facility placement at the time of discharge. This information, including post cath instructions and activity restrictions, has been clearly outlined in the discharge AVS.   Discharge Vitals:  Blood pressure 141/57, pulse 82, temperature 99.2 F (37.3 C), temperature source  Oral, resp. rate 18, height 5\' 5"  (1.651 m), weight 82.101 kg (181 lb), SpO2 95.00%.   Weight change: -1.497 kg (-3 lb 4.8 oz)  Labs: Recent Labs     04/20/12  0550  04/21/12  0535  WBC  12.3*  10.9*  HGB  12.8  13.1  HCT  38.2  38.5  MCV  88.0  87.9  PLT  219  229    Recent Labs Lab 04/18/12 0630 04/19/12 0530 04/19/12 0920 04/20/12 0550  NA 138 137  --  136  K 3.9 4.1  --  4.1  CL 100 99  --  99  CO2 27 26  --  27  BUN 21 21  --  21    CREATININE 0.72 0.77  --  0.74  CALCIUM 9.4 9.3  --  9.4  PROT  --   --  6.8  --   BILITOT  --   --  0.5  --   ALKPHOS  --   --  80  --   ALT  --   --  10  --   AST  --   --  11  --   GLUCOSE 285* 286*  --  318*   Disposition:   Future Appointments Provider Department Dept Phone   04/26/2012 2:20 PM Prescott Parma, PA-C [facility] (near [place]) [phone]   05/24/2012 1:30 PM Luis Abed, MD [facility] (near [place]) [phone]         Follow-up Information   Follow up with Nona Dell, MD On 04/26/2012. (At 2:20 PM for follow-up. )    Contact information:   [address] [place] [address] [phone]       Follow up with Milinda Antis, MD In 1 week. (For post-hospital follow-up)    Contact information:   [address] [place] [address] [phone]       Discharge Medications:    Medication List    TAKE these medications       aspirin 325 MG tablet  Take 325 mg by mouth daily.     carvedilol 25 MG tablet  Commonly known as:  COREG  Take 12.5 mg by mouth 2 (two) times daily with a meal.     cetirizine 10 MG tablet  Commonly known as:  ZYRTEC  TAKE ONE TABLET DAILY.     ciprofloxacin 500 MG tablet  Commonly known as:  CIPRO  Take 1 tablet (500 mg total) by mouth 2 (two) times daily.     clopidogrel 75 MG tablet  Commonly known as:  PLAVIX  Take 1 tablet (75 mg total) by mouth daily.     fluticasone 50 MCG/ACT nasal spray  Commonly known as:  FLONASE  Place 1 spray into the nose daily as needed. For allergies     folic acid 1 MG tablet  Commonly known as:  FOLVITE  Take 1 mg by mouth daily.     furosemide 40 MG tablet  Commonly known as:  LASIX  Take 1 tablets by mouth in the morning and 1 tablet by mouth in the evening.     HUMALOG 100 UNIT/ML injection  Generic drug:  insulin lispro  INJECT 2 TO 10 UNITS SUBCUTANEOUSLY FOUR TIMES DAILY AS DIRECTED USING SLIDING SCALE.      HYDROcodone-acetaminophen 5-500 MG per tablet  Commonly known as:  VICODIN  Take 1 tablet by mouth at bedtime as needed for pain.  insulin glargine 100 UNIT/ML injection  Commonly known as:  LANTUS  Inject 65 Units into the skin at bedtime.     isosorbide mononitrate 30 MG 24 hr tablet  Commonly known as:  IMDUR  Take 30 mg by mouth daily.     levothyroxine 112 MCG tablet  Commonly known as:  SYNTHROID, LEVOTHROID  Take 56 mcg by mouth daily.     meclizine 25 MG tablet  Commonly known as:  ANTIVERT  TAKE (1) TABLET BY MOUTH (3) TIMES DAILY AS NEEDED.     metFORMIN 500 MG tablet  Commonly known as:  GLUCOPHAGE  TAKE 2 TABLETS TWICE DAILY WITH FOOD AS DIRECTED.     NITROSTAT 0.4 MG SL tablet  Generic drug:  nitroGLYCERIN  Place 0.4 mg under the tongue every 5 (five) minutes as needed. May repeat times three.     NON-ASPIRIN 325 MG tablet  Generic drug:  acetaminophen  Take 650 mg by mouth every 6 (six) hours as needed. For pain     omega-3 acid ethyl esters 1 G capsule  Commonly known as:  LOVAZA  Take 2 capsules (2 g total) by mouth 2 (two) times daily.     promethazine 25 MG tablet  Commonly known as:  PHENERGAN  TAKE 1 TABLET EVERY 4 TO 6 HOURS AS NEEDED FOR NAUSEA AND VOMITING.     ramipril 5 MG capsule  Commonly known as:  ALTACE  Take 1 capsule (5 mg total) by mouth daily.       Outstanding Labs/Studies: None  Duration of Discharge Encounter: Greater than 30 minutes including physician time.  Signed, R. Hurman Horn, PA-C 04/21/2012, 12:31 PM

## 2012-04-22 NOTE — Telephone Encounter (Signed)
Attempted to contact patient. Left message to return call.

## 2012-04-23 LAB — URINE CULTURE

## 2012-04-23 NOTE — Telephone Encounter (Signed)
Attempted to contact patient again.  Left message on machine regarding eph on 4/7 with Gene Serpe, PA.

## 2012-04-26 ENCOUNTER — Ambulatory Visit: Payer: Medicare Other | Admitting: Physician Assistant

## 2012-04-28 ENCOUNTER — Encounter (INDEPENDENT_AMBULATORY_CARE_PROVIDER_SITE_OTHER): Payer: Medicare Other

## 2012-04-28 ENCOUNTER — Ambulatory Visit (INDEPENDENT_AMBULATORY_CARE_PROVIDER_SITE_OTHER): Payer: Medicare Other | Admitting: Physician Assistant

## 2012-04-28 ENCOUNTER — Encounter: Payer: Self-pay | Admitting: Physician Assistant

## 2012-04-28 VITALS — BP 110/71 | HR 91 | Ht 65.0 in | Wt 183.1 lb

## 2012-04-28 DIAGNOSIS — R079 Chest pain, unspecified: Secondary | ICD-10-CM

## 2012-04-28 DIAGNOSIS — I6529 Occlusion and stenosis of unspecified carotid artery: Secondary | ICD-10-CM

## 2012-04-28 DIAGNOSIS — I6521 Occlusion and stenosis of right carotid artery: Secondary | ICD-10-CM

## 2012-04-28 DIAGNOSIS — R0989 Other specified symptoms and signs involving the circulatory and respiratory systems: Secondary | ICD-10-CM

## 2012-04-28 DIAGNOSIS — I959 Hypotension, unspecified: Secondary | ICD-10-CM

## 2012-04-28 DIAGNOSIS — I2589 Other forms of chronic ischemic heart disease: Secondary | ICD-10-CM

## 2012-04-28 DIAGNOSIS — N390 Urinary tract infection, site not specified: Secondary | ICD-10-CM

## 2012-04-28 DIAGNOSIS — I255 Ischemic cardiomyopathy: Secondary | ICD-10-CM

## 2012-04-28 MED ORDER — ASPIRIN EC 81 MG PO TBEC: 81.0000 mg | DELAYED_RELEASE_TABLET | Freq: Every day | ORAL | Status: DC

## 2012-04-28 MED ORDER — CIPROFLOXACIN HCL 500 MG PO TABS: 500.0000 mg | ORAL_TABLET | Freq: Two times a day (BID) | ORAL | Status: DC

## 2012-04-28 NOTE — Assessment & Plan Note (Signed)
Will evaluate further with carotid Dopplers to rule out significant ICA disease.

## 2012-04-28 NOTE — Patient Instructions (Addendum)
   Decrease Aspirin to 81mg  daily  Begin Cipro 500mg  twice a day  X 10 days only  Follow up with Dr. Jacqulynn Cadet within 5-7 days Continue all other current medications. Carotid doppler  Office will notify of results Follow up in  3-4 weeks

## 2012-04-28 NOTE — Assessment & Plan Note (Signed)
Patient reports continued dysuria, despite having completed course of ciprofloxacin yesterday. Will continue at same dose 500 twice a day (x10 days), and arrange for followup with her primary M.D. Of note, recent urine culture yielded sensitivity to ciprofloxacin.

## 2012-04-28 NOTE — Assessment & Plan Note (Signed)
Well-controlled on current medication regimen 

## 2012-04-28 NOTE — Addendum Note (Signed)
Addended by: Lesle Chris on: 04/28/2012 02:19 PM   Modules accepted: Orders

## 2012-04-28 NOTE — Assessment & Plan Note (Addendum)
Currently stable on medical therapy, with no recurrent CP. No acute EKG changes. Continue DAPT with Plavix, but decrease full dose ASA to 81 mg daily. Continue current dose carvedilol and reassess at time of early followup with Dr. Myrtis Ser. If BP remains stable, would further uptitrate carvedilol.

## 2012-04-28 NOTE — Progress Notes (Signed)
Primary Cardiologist: Jerral Bonito, MD   HPI: Post hospital followup from [facility], status post presentation on March 27 with anterior ST EMI. Patient underwent emergent cardiac catheterization and successful PTCA of high-grade ramus branch lesion, but with suboptimal angiographic result and unsuccessful stenting due tortuous anatomy.   Residual anatomy: Patent proximal LAD stent, 60-70% segmental proximal LAD stenosis; 100% occluded CFX; 100% occluded proximal RCA; widely patent L.-LAD, S.-RCA grafts; chronic, 100% occluded S.-CFX graft; EF 25-30%, no focal WMAs   Lipid profile: Triglycerides 555, total cholesterol 218, HDL 34, LDL not calculated. Patient refused Lovaza, secondary to previous advise to not take if concomitant dye allergy. Also reportedly intolerant to Lipitor.  Patient treated empirically with ciprofloxacin for UTI.  Following review of recent brain MRI suggesting questionable hemorrhagic changes, patient was taken off Brilinta and placed on Plavix.  Clinically, she denies any recurrent CP. She denies any symptoms suggestive of heart failure. She does, however, complained of persistent dysuria. Records indicate that she completed her post hospital course of ciprofloxacin yesterday, at the nursing home.   12-lead EKG today, reviewed by me, indicates NSR 93 bpm; LAD; LVH with repolarization abnormalities  Allergies  Allergen Reactions  . Atorvastatin Other (See Comments)    Unknown-patient admitted to hospital after taking  . Citrus Dermatitis    BREAK OUT ON BODY  . Iodine   . Iohexol      Desc: ANAPHYLAXIS   . Zolpidem Tartrate     REACTION: hallucinations  . Milk-Related Compounds Rash    Current Outpatient Prescriptions  Medication Sig Dispense Refill  . acetaminophen (NON-ASPIRIN) 325 MG tablet Take 650 mg by mouth every 6 (six) hours as needed. For pain      . carvedilol (COREG) 25 MG tablet Take 12.5 mg by mouth 2 (two) times daily with a meal.      . cetirizine  (ZYRTEC) 10 MG tablet TAKE ONE TABLET DAILY.  30 tablet  2  . ciprofloxacin (CIPRO) 500 MG tablet Take 1 tablet (500 mg total) by mouth 2 (two) times daily.  14 tablet  0  . clopidogrel (PLAVIX) 75 MG tablet Take 1 tablet (75 mg total) by mouth daily.  30 tablet  3  . diphenhydrAMINE (SOMINEX) 25 MG tablet Take 25 mg by mouth at bedtime as needed for sleep.      . fluticasone (FLONASE) 50 MCG/ACT nasal spray Place 1 spray into the nose daily as needed. For allergies      . folic acid (FOLVITE) 1 MG tablet Take 1 mg by mouth daily.      . furosemide (LASIX) 40 MG tablet Take 1 tablets by mouth in the morning and 1 tablet by mouth in the evening.      [name] HUMALOG 100 UNIT/ML injection INJECT 2 TO 10 UNITS SUBCUTANEOUSLY FOUR TIMES DAILY AS DIRECTED USING SLIDING SCALE.  10 mL  1  . HYDROcodone-acetaminophen (NORCO/VICODIN) 5-325 MG per tablet Take 1 tablet by mouth every 6 (six) hours as needed for pain.      [name] insulin glargine (LANTUS) 100 UNIT/ML injection Inject 65 Units into the skin at bedtime.      . isosorbide mononitrate (IMDUR) 30 MG 24 hr tablet Take 30 mg by mouth daily.      [name] levothyroxine (SYNTHROID, LEVOTHROID) 112 MCG tablet Take 56 mcg by mouth daily.      [name] loperamide (IMODIUM) 2 MG capsule Take 2 mg by mouth 4 (four) times daily as needed for diarrhea or loose stools.      [name]  meclizine (ANTIVERT) 25 MG tablet TAKE (1) TABLET BY MOUTH (3) TIMES DAILY AS NEEDED.  90 tablet  1  . metFORMIN (GLUCOPHAGE) 500 MG tablet TAKE 2 TABLETS TWICE DAILY WITH FOOD AS DIRECTED.  270 tablet  1  . nitroGLYCERIN (NITROSTAT) 0.4 MG SL tablet Place 0.4 mg under the tongue every 5 (five) minutes as needed. May repeat times three.       . omega-3 acid ethyl esters (LOVAZA) 1 G capsule Take 2 capsules (2 g total) by mouth 2 (two) times daily.  60 capsule  6  . promethazine (PHENERGAN) 25 MG tablet TAKE 1 TABLET EVERY 4 TO 6 HOURS AS NEEDED FOR NAUSEA AND VOMITING.  30 tablet  0  . ramipril (ALTACE) 5 MG  capsule Take 1 capsule (5 mg total) by mouth daily.  30 capsule  3  . aspirin EC 81 MG tablet Take 1 tablet (81 mg total) by mouth daily.      . ciprofloxacin (CIPRO) 500 MG tablet Take 1 tablet (500 mg total) by mouth 2 (two) times daily.  20 tablet  0   No current facility-administered medications for this visit.    Past Medical History  Diagnosis Date  . Hypertension     Unspecified  . Hyperlipidemia     Mixed  . Coronary artery disease 08/21/09    a. Catheterization 2006 occluded OM vein graft b.   nuclear 2008 question slight ischemia c. anterior MI 04/15/12 s/p PTCA-LCx  . S/P CABG (coronary artery bypass graft) 2003     2003  LIMA, LAD, SVG to the OM, SVG right coronary artery  . Aneurysm 2003    Ascending thoracic aneurysm repair with a graft at time of CABG  . Diabetes mellitus     Insulin dependent  . Gastroparesis   . Depression   . Allergy history, radiographic dye     Contrast dye allergy  . Ejection fraction     EF 50%  //  Ejection fraction 25-30% at the time of stress echo,  May, 2013  . Previous back surgery   . Syncope 2008    Question?  2008  . Statin intolerance   . Shortness of breath     Exertional shortness of breath, May, 2013  . Systolic CHF     June, 2013  . Ischemic cardiomyopathy 06/2011    a. Echo 04/19/12: EF 25-30%, mod LVH, diffuse HK, trivial AI  . Thyroid disease   . CHF (congestive heart failure)   . Nausea     October, 2013  . Hypotension     October, 2013  . Hypothyroidism     Treated with low-dose Synthroid    Past Surgical History  Procedure Laterality Date  . Coronary artery bypass graft  2003     LIMA, LAD, SVG to the OM, SVG right coronary artery  . Bladder tacking    . Salivary gland surgery    . Total abdominal hysterectomy    . Back surgery    . Breast surgery    . External ear surgery    . Cardiac catheterization  04/15/12    60-70% proximal LAD, 95% mid AV groove circumflex s/p PTCA, proximal RCA occlusion, patent  LIMA to LAD, patent SVG to distal RCA, chronically occluded SVG to circumflex; LVEF 25-30%    History   Social History  . Marital Status: Divorced    Spouse Name: N/A    Number of Children: N/A  . Years of Education: N/A  Occupational History  . Retired    Social History Main Topics  . Smoking status: Never Smoker   . Smokeless tobacco: Never Used  . Alcohol Use: No  . Drug Use: No  . Sexually Active: Not on file   Other Topics Concern  . Not on file   Social History Narrative  . No narrative on file    Family History  Problem Relation Age of Onset  . Heart disease Other   . Heart disease Mother   . Hypertension Mother   . Heart disease Father   . Hypertension Father     ROS: no nausea, vomiting; no fever, chills; no melena, hematochezia; no claudication  PHYSICAL EXAM: BP 110/71  Pulse 91  Ht 5\' 5"  (1.651 m)  Wt 183 lb 1.9 oz (83.063 kg)  BMI 30.47 kg/m2 GENERAL: 79 year old female; NAD HEENT: NCAT, PERRLA, EOMI; sclera clear; no xanthelasma NECK: R Carotid bruit; no JVD  CARDIAC: RRR (S1, S2); no significant murmurs; no rubs or gallops ABDOMEN: soft, non-tender; intact BS EXTREMETIES: no significant peripheral edema SKIN: warm/dry; no obvious rash/lesions MUSCULOSKELETAL: no joint deformity NEURO: no focal deficit; NL affect   EKG: reviewed and available in Electronic Records   ASSESSMENT & PLAN:  Cardiomyopathy, ischemic Currently stable on medical therapy, with no recurrent CP. No acute EKG changes. Continue DAPT with Plavix, but decrease full dose ASA to 81 mg daily. Continue current dose carvedilol and reassess at time of early followup with Dr. Myrtis Ser. If BP remains stable, would further uptitrate carvedilol.  UTI (urinary tract infection) Patient reports continued dysuria, despite having completed course of ciprofloxacin yesterday. Will continue at same dose 500 twice a day (x10 days), and arrange for followup with her primary M.D. Of note,  recent urine culture yielded sensitivity to ciprofloxacin.  Right carotid bruit Will evaluate further with carotid Dopplers to rule out significant ICA disease.  Hypotension Well-controlled on current medication regimen    Gene Burdette Forehand, PAC

## 2012-04-29 ENCOUNTER — Other Ambulatory Visit: Payer: Self-pay | Admitting: *Deleted

## 2012-05-03 ENCOUNTER — Encounter: Payer: Self-pay | Admitting: Vascular Surgery

## 2012-05-04 ENCOUNTER — Ambulatory Visit (INDEPENDENT_AMBULATORY_CARE_PROVIDER_SITE_OTHER): Payer: Medicare Other | Admitting: Vascular Surgery

## 2012-05-04 ENCOUNTER — Other Ambulatory Visit (INDEPENDENT_AMBULATORY_CARE_PROVIDER_SITE_OTHER): Payer: Medicare Other | Admitting: *Deleted

## 2012-05-04 ENCOUNTER — Encounter: Payer: Self-pay | Admitting: Vascular Surgery

## 2012-05-04 ENCOUNTER — Encounter: Payer: Self-pay | Admitting: Cardiology

## 2012-05-04 DIAGNOSIS — I6529 Occlusion and stenosis of unspecified carotid artery: Secondary | ICD-10-CM

## 2012-05-04 NOTE — Progress Notes (Signed)
   The patient has a carotid stenosis greater than 80%. She's asymptomatic. She had been sent to Dr. Arbie Cookey. He assessed her very nicely and called me today. He and I both agree that we should wait until the patient is discharged from her rehabilitation facility. I will be seeing her back in the office. When I feel that she is stable I will arrange for early followup with Dr. Arbie Cookey to see if we can proceed with carotid surgery.  Jerral Bonito, MD

## 2012-05-04 NOTE — Progress Notes (Signed)
[facility] [place]   Patient name: Cathy [name] MRN: [id number] DOB: 07/30/1930 Sex: female   Referred by: Myrtis Ser  Reason for referral:  Chief Complaint  Patient presents with  . Carotid    RE-Dr. Myrtis Ser from [facility] in [place], [place]     Duplex- 04-28-12 at [facility].     HISTORY OF PRESENT ILLNESS: The patient has today for scheduling of severe stenosis in the right internal carotid artery. She recently had a extensive hospitalization for coronary event. She presented on 04/15/2012 with an anterior MI. She underwent emergent cardiac catheterization with coronary angioplasty but inability to stent the 2 tortuosity of her anatomy with significant ongoing cardiac disease. She was discharged to nursing facility following this procedure and is continuing to regain her string. She was found to have a high-grade carotid stenosis on the right by duplex and we are seeing her for further discussion of this. She denies any prior amaurosis fugax, transient ischemic attack or stroke. She is here today with her family.  Past Medical History  Diagnosis Date  . Hypertension     Unspecified  . Hyperlipidemia     Mixed  . Coronary artery disease 08/21/09    a. Catheterization 2006 occluded OM vein graft b.   nuclear 2008 question slight ischemia c. anterior MI 04/15/12 s/p PTCA-LCx  . S/P CABG (coronary artery bypass graft) 2003     2003  LIMA, LAD, SVG to the OM, SVG right coronary artery  . Aneurysm 2003    Ascending thoracic aneurysm repair with a graft at time of CABG  . Diabetes mellitus     Insulin dependent  . Gastroparesis   . Depression   . Allergy history, radiographic dye     Contrast dye allergy  . Ejection fraction     EF 50%  //  Ejection fraction 25-30% at the time of stress echo,  May, 2013  . Previous back surgery   . Syncope 2008    Question?  2008  . Statin intolerance   . Shortness of breath     Exertional shortness of breath, May, 2013  .  Systolic CHF     June, 2013  . Ischemic cardiomyopathy 06/2011    a. Echo 04/19/12: EF 25-30%, mod LVH, diffuse HK, trivial AI  . Thyroid disease   . CHF (congestive heart failure)   . Nausea     October, 2013  . Hypotension     October, 2013  . Hypothyroidism     Treated with low-dose Synthroid    Past Surgical History  Procedure Laterality Date  . Coronary artery bypass graft  2003     LIMA, LAD, SVG to the OM, SVG right coronary artery  . Bladder tacking    . Salivary gland surgery    . Total abdominal hysterectomy    . Back surgery    . Breast surgery    . External ear surgery    . Cardiac catheterization  04/15/12    60-70% proximal LAD, 95% mid AV groove circumflex s/p PTCA, proximal RCA occlusion, patent LIMA to LAD, patent SVG to distal RCA, chronically occluded SVG to circumflex; LVEF 25-30%    History   Social History  . Marital Status: Divorced    Spouse Name: N/A    Number of Children: N/A  . Years of Education: N/A   Occupational History  . Retired    Social History Main Topics  . Smoking status: Never  Smoker   . Smokeless tobacco: Never Used  . Alcohol Use: No  . Drug Use: No  . Sexually Active: Not on file   Other Topics Concern  . Not on file   Social History Narrative  . No narrative on file    Family History  Problem Relation Age of Onset  . Heart disease Other   . Heart disease Mother   . Hypertension Mother   . Heart disease Father   . Hypertension Father     Allergies as of 05/04/2012 - Review Complete 05/04/2012  Allergen Reaction Noted  . Atorvastatin Other (See Comments)   . Citrus Dermatitis 07/19/2010  . Iodine    . Iohexol  04/26/2003  . Zolpidem tartrate    . Milk-related compounds Rash 07/09/2011    Current Outpatient Prescriptions on File Prior to Visit  Medication Sig Dispense Refill  . aspirin EC 81 MG tablet Take 1 tablet (81 mg total) by mouth daily.      . carvedilol (COREG) 25 MG tablet Take 12.5 mg by mouth  2 (two) times daily with a meal.      . cetirizine (ZYRTEC) 10 MG tablet TAKE ONE TABLET DAILY.  30 tablet  2  . ciprofloxacin (CIPRO) 500 MG tablet Take 1 tablet (500 mg total) by mouth 2 (two) times daily.  20 tablet  0  . clopidogrel (PLAVIX) 75 MG tablet Take 1 tablet (75 mg total) by mouth daily.  30 tablet  3  . diphenhydrAMINE (SOMINEX) 25 MG tablet Take 25 mg by mouth at bedtime as needed for sleep.      . fluticasone (FLONASE) 50 MCG/ACT nasal spray Place 1 spray into the nose daily as needed. For allergies      . folic acid (FOLVITE) 1 MG tablet Take 1 mg by mouth daily.      . furosemide (LASIX) 40 MG tablet Take 1 tablets by mouth in the morning and 1 tablet by mouth in the evening.      [name] HUMALOG 100 UNIT/ML injection INJECT 2 TO 10 UNITS SUBCUTANEOUSLY FOUR TIMES DAILY AS DIRECTED USING SLIDING SCALE.  10 mL  1  . HYDROcodone-acetaminophen (NORCO/VICODIN) 5-325 MG per tablet Take 1 tablet by mouth every 6 (six) hours as needed for pain.      [name] insulin glargine (LANTUS) 100 UNIT/ML injection Inject 65 Units into the skin at bedtime.      . isosorbide mononitrate (IMDUR) 30 MG 24 hr tablet Take 30 mg by mouth daily.      [name] levothyroxine (SYNTHROID, LEVOTHROID) 112 MCG tablet Take 56 mcg by mouth daily.      [name] loperamide (IMODIUM) 2 MG capsule Take 2 mg by mouth 4 (four) times daily as needed for diarrhea or loose stools.      . meclizine (ANTIVERT) 25 MG tablet TAKE (1) TABLET BY MOUTH (3) TIMES DAILY AS NEEDED.  90 tablet  1  . metFORMIN (GLUCOPHAGE) 500 MG tablet TAKE 2 TABLETS TWICE DAILY WITH FOOD AS DIRECTED.  270 tablet  1  . nitroGLYCERIN (NITROSTAT) 0.4 MG SL tablet Place 0.4 mg under the tongue every 5 (five) minutes as needed. May repeat times three.       . omega-3 acid ethyl esters (LOVAZA) 1 G capsule Take 2 capsules (2 g total) by mouth 2 (two) times daily.  60 capsule  6  . promethazine (PHENERGAN) 25 MG tablet TAKE 1 TABLET EVERY 4 TO 6 HOURS AS NEEDED FOR NAUSEA AND  VOMITING.  30 tablet  0  . ramipril (ALTACE) 5 MG capsule Take 1 capsule (5 mg total) by mouth daily.  30 capsule  3   No current facility-administered medications on file prior to visit.     REVIEW OF SYSTEMS:  Positives indicated with an "X"  CARDIOVASCULAR:  [x ] chest pain   [x ] chest pressure   [ ]  palpitations   [x ] orthopnea   [x ] dyspnea on exertion   [ ]  claudication   [ ]  rest pain   [x ] DVT   [ ]  phlebitis PULMONARY:   [ ]  productive cough   [ ]  asthma   [ ]  wheezing NEUROLOGIC:   [x ] weakness  [ ]  paresthesias  [ ]  aphasia  [ ]  amaurosis  [x ] dizziness HEMATOLOGIC:   [ ]  bleeding problems   [ ]  clotting disorders MUSCULOSKELETAL:  [ ]  joint pain   [ ]  joint swelling GASTROINTESTINAL: [ ]   blood in stool  [ ]   hematemesis GENITOURINARY:  [x ]  dysuria  [ ]   hematuria PSYCHIATRIC:  [x ] history of major depression INTEGUMENTARY:  [ ]  rashes  [ ]  ulcers CONSTITUTIONAL:  [ ]  fever   [ ]  chills  PHYSICAL EXAMINATION:  General: The patient is a well-nourished female, in no acute distress. Vital signs are BP 146/80  Pulse 76  Resp 16  Ht 5\' 5"  (1.651 m)  Wt 182 lb (82.555 kg)  BMI 30.29 kg/m2  SpO2 97% Pulmonary: There is a good air exchange bilaterally without wheezing or rales. Abdomen: Soft and non-tender with normal pitch bowel sounds. Musculoskeletal: There are no major deformities.  There is no significant extremity pain. Neurologic: No focal weakness or paresthesias are detected, Skin: There are no ulcer or rashes noted. Psychiatric: The patient has normal affect. Cardiovascular: There is a regular rate and rhythm without significant murmur appreciated. Carotid arteries with soft right carotid bruit 2+ radial pulses bilaterally  [facility] Vascular Lab Studies:  Ordered and Independently Reviewed a carotid duplex confirms high-grade stenosis greater than 80% with normal internal carotid artery distal to this. As compared with the study at [facility]  she has no significant left carotid stenosis  Impression and Plan:  Severe asymptomatic right internal carotid artery stenosis in a right-handed individual area I discussed the significance of this with the patient and her family present. I also discussed this with Dr. Myrtis Ser telephone. Obviously the 2 her cardiac status we would defer any endarterectomy until she is recovered from her recent MI. Dr. Myrtis Ser will see her back in the office for followup and notify us he feels that the timing is appropriate for right carotid endarterectomy for high-grade carotid stenosis    Zaynab Chipman [facility] Office: [phone]

## 2012-05-05 ENCOUNTER — Telehealth: Payer: Self-pay | Admitting: Family Medicine

## 2012-05-06 ENCOUNTER — Telehealth: Payer: Self-pay | Admitting: Family Medicine

## 2012-05-06 NOTE — Telephone Encounter (Signed)
See previous note

## 2012-05-06 NOTE — Telephone Encounter (Signed)
Spoke with pt, doing well left SNF because of finances,  Seen by cards, planning possible CEA She is to have home PT, has been set up by SNF Will make appt with me within the next week

## 2012-05-06 NOTE — Telephone Encounter (Signed)
Patient states that she was in [place] nursing facility? [place] to [place]  She was discharged on 4/16.  Was notified that she was seen by PT from [facility] previously and she states that the facility she was in will refer her for therapy.

## 2012-05-06 NOTE — Telephone Encounter (Signed)
Cathy [name] spoke with pt and took another message

## 2012-05-12 ENCOUNTER — Other Ambulatory Visit: Payer: Self-pay | Admitting: *Deleted

## 2012-05-12 MED ORDER — CLOPIDOGREL BISULFATE 75 MG PO TABS: 75.0000 mg | ORAL_TABLET | Freq: Every day | ORAL | Status: DC

## 2012-05-13 ENCOUNTER — Telehealth: Payer: Self-pay | Admitting: Family Medicine

## 2012-05-13 NOTE — Telephone Encounter (Signed)
Do you want to send verbal order for home health aide?

## 2012-05-13 NOTE — Telephone Encounter (Signed)
Okay to give verbal order for [facility] aide

## 2012-05-18 NOTE — Telephone Encounter (Signed)
Verbal order given for home health aide as well as OT

## 2012-05-20 ENCOUNTER — Ambulatory Visit: Payer: Medicare Other | Admitting: Family Medicine

## 2012-05-20 ENCOUNTER — Encounter: Payer: Self-pay | Admitting: Family Medicine

## 2012-05-20 ENCOUNTER — Emergency Department (HOSPITAL_COMMUNITY): Payer: Medicare Other

## 2012-05-20 ENCOUNTER — Ambulatory Visit (INDEPENDENT_AMBULATORY_CARE_PROVIDER_SITE_OTHER): Payer: Medicare Other | Admitting: Family Medicine

## 2012-05-20 ENCOUNTER — Inpatient Hospital Stay (HOSPITAL_COMMUNITY)
Admission: EM | Admit: 2012-05-20 | Discharge: 2012-05-25 | DRG: 872 | Disposition: A | Discharge status: Home Health | Payer: Medicare Other | Attending: Internal Medicine | Admitting: Internal Medicine

## 2012-05-20 ENCOUNTER — Encounter (HOSPITAL_COMMUNITY): Payer: Self-pay | Admitting: Emergency Medicine

## 2012-05-20 VITALS — BP 78/52 | HR 92 | Resp 20

## 2012-05-20 DIAGNOSIS — I959 Hypotension, unspecified: Secondary | ICD-10-CM

## 2012-05-20 DIAGNOSIS — Z7902 Long term (current) use of antithrombotics/antiplatelets: Secondary | ICD-10-CM

## 2012-05-20 DIAGNOSIS — Z7982 Long term (current) use of aspirin: Secondary | ICD-10-CM

## 2012-05-20 DIAGNOSIS — K3184 Gastroparesis: Secondary | ICD-10-CM

## 2012-05-20 DIAGNOSIS — M542 Cervicalgia: Secondary | ICD-10-CM

## 2012-05-20 DIAGNOSIS — R079 Chest pain, unspecified: Secondary | ICD-10-CM

## 2012-05-20 DIAGNOSIS — Z8249 Family history of ischemic heart disease and other diseases of the circulatory system: Secondary | ICD-10-CM

## 2012-05-20 DIAGNOSIS — E039 Hypothyroidism, unspecified: Secondary | ICD-10-CM | POA: Diagnosis present

## 2012-05-20 DIAGNOSIS — R0602 Shortness of breath: Secondary | ICD-10-CM

## 2012-05-20 DIAGNOSIS — I6529 Occlusion and stenosis of unspecified carotid artery: Secondary | ICD-10-CM | POA: Diagnosis present

## 2012-05-20 DIAGNOSIS — I5022 Chronic systolic (congestive) heart failure: Secondary | ICD-10-CM

## 2012-05-20 DIAGNOSIS — E860 Dehydration: Secondary | ICD-10-CM | POA: Diagnosis present

## 2012-05-20 DIAGNOSIS — D72829 Elevated white blood cell count, unspecified: Secondary | ICD-10-CM

## 2012-05-20 DIAGNOSIS — Z91041 Radiographic dye allergy status: Secondary | ICD-10-CM

## 2012-05-20 DIAGNOSIS — A419 Sepsis, unspecified organism: Principal | ICD-10-CM | POA: Diagnosis present

## 2012-05-20 DIAGNOSIS — R0989 Other specified symptoms and signs involving the circulatory and respiratory systems: Secondary | ICD-10-CM

## 2012-05-20 DIAGNOSIS — Z951 Presence of aortocoronary bypass graft: Secondary | ICD-10-CM

## 2012-05-20 DIAGNOSIS — N179 Acute kidney failure, unspecified: Secondary | ICD-10-CM | POA: Diagnosis present

## 2012-05-20 DIAGNOSIS — Z789 Other specified health status: Secondary | ICD-10-CM

## 2012-05-20 DIAGNOSIS — E1149 Type 2 diabetes mellitus with other diabetic neurological complication: Secondary | ICD-10-CM | POA: Diagnosis present

## 2012-05-20 DIAGNOSIS — I729 Aneurysm of unspecified site: Secondary | ICD-10-CM

## 2012-05-20 DIAGNOSIS — E119 Type 2 diabetes mellitus without complications: Secondary | ICD-10-CM | POA: Diagnosis present

## 2012-05-20 DIAGNOSIS — E1140 Type 2 diabetes mellitus with diabetic neuropathy, unspecified: Secondary | ICD-10-CM

## 2012-05-20 DIAGNOSIS — Z9861 Coronary angioplasty status: Secondary | ICD-10-CM

## 2012-05-20 DIAGNOSIS — I200 Unstable angina: Secondary | ICD-10-CM

## 2012-05-20 DIAGNOSIS — Z1639 Resistance to other specified antimicrobial drug: Secondary | ICD-10-CM | POA: Diagnosis present

## 2012-05-20 DIAGNOSIS — I255 Ischemic cardiomyopathy: Secondary | ICD-10-CM

## 2012-05-20 DIAGNOSIS — M549 Dorsalgia, unspecified: Secondary | ICD-10-CM

## 2012-05-20 DIAGNOSIS — R42 Dizziness and giddiness: Secondary | ICD-10-CM

## 2012-05-20 DIAGNOSIS — F329 Major depressive disorder, single episode, unspecified: Secondary | ICD-10-CM | POA: Diagnosis present

## 2012-05-20 DIAGNOSIS — I2589 Other forms of chronic ischemic heart disease: Secondary | ICD-10-CM

## 2012-05-20 DIAGNOSIS — R6520 Severe sepsis without septic shock: Secondary | ICD-10-CM

## 2012-05-20 DIAGNOSIS — E785 Hyperlipidemia, unspecified: Secondary | ICD-10-CM

## 2012-05-20 DIAGNOSIS — E782 Mixed hyperlipidemia: Secondary | ICD-10-CM | POA: Diagnosis present

## 2012-05-20 DIAGNOSIS — F3289 Other specified depressive episodes: Secondary | ICD-10-CM | POA: Diagnosis present

## 2012-05-20 DIAGNOSIS — Z888 Allergy status to other drugs, medicaments and biological substances status: Secondary | ICD-10-CM

## 2012-05-20 DIAGNOSIS — Z794 Long term (current) use of insulin: Secondary | ICD-10-CM

## 2012-05-20 DIAGNOSIS — I10 Essential (primary) hypertension: Secondary | ICD-10-CM | POA: Diagnosis present

## 2012-05-20 DIAGNOSIS — E872 Acidosis, unspecified: Secondary | ICD-10-CM | POA: Diagnosis present

## 2012-05-20 DIAGNOSIS — R9430 Abnormal result of cardiovascular function study, unspecified: Secondary | ICD-10-CM

## 2012-05-20 DIAGNOSIS — N189 Chronic kidney disease, unspecified: Secondary | ICD-10-CM

## 2012-05-20 DIAGNOSIS — R55 Syncope and collapse: Secondary | ICD-10-CM

## 2012-05-20 DIAGNOSIS — Z79899 Other long term (current) drug therapy: Secondary | ICD-10-CM

## 2012-05-20 DIAGNOSIS — I2510 Atherosclerotic heart disease of native coronary artery without angina pectoris: Secondary | ICD-10-CM | POA: Diagnosis present

## 2012-05-20 DIAGNOSIS — I2109 ST elevation (STEMI) myocardial infarction involving other coronary artery of anterior wall: Secondary | ICD-10-CM | POA: Diagnosis present

## 2012-05-20 DIAGNOSIS — N390 Urinary tract infection, site not specified: Secondary | ICD-10-CM | POA: Diagnosis present

## 2012-05-20 DIAGNOSIS — R591 Generalized enlarged lymph nodes: Secondary | ICD-10-CM

## 2012-05-20 DIAGNOSIS — I509 Heart failure, unspecified: Secondary | ICD-10-CM | POA: Diagnosis present

## 2012-05-20 LAB — URINE MICROSCOPIC-ADD ON

## 2012-05-20 LAB — CBC WITH DIFFERENTIAL/PLATELET
Basophils Relative: 0 % (ref 0–1)
Eosinophils Absolute: 0.1 10*3/uL (ref 0.0–0.7)
Hemoglobin: 14.7 g/dL (ref 12.0–15.0)
MCH: 30.1 pg (ref 26.0–34.0)
MCHC: 34.2 g/dL (ref 30.0–36.0)
Monocytes Relative: 7 % (ref 3–12)
Neutrophils Relative %: 63 % (ref 43–77)
Platelets: 267 10*3/uL (ref 150–400)

## 2012-05-20 LAB — HEPATIC FUNCTION PANEL
AST: 12 U/L (ref 0–37)
Albumin: 2.9 g/dL — ABNORMAL LOW (ref 3.5–5.2)
Alkaline Phosphatase: 59 U/L (ref 39–117)
Total Bilirubin: 0.3 mg/dL (ref 0.3–1.2)

## 2012-05-20 LAB — URINALYSIS, ROUTINE W REFLEX MICROSCOPIC
Ketones, ur: 15 mg/dL — AB
Protein, ur: 30 mg/dL — AB
Urobilinogen, UA: 1 mg/dL (ref 0.0–1.0)

## 2012-05-20 LAB — GLUCOSE, CAPILLARY: Glucose-Capillary: 197 mg/dL — ABNORMAL HIGH (ref 70–99)

## 2012-05-20 LAB — BASIC METABOLIC PANEL
BUN: 22 mg/dL (ref 6–23)
Creatinine, Ser: 1.31 mg/dL — ABNORMAL HIGH (ref 0.50–1.10)
GFR calc Af Amer: 43 mL/min — ABNORMAL LOW (ref >90)
GFR calc non Af Amer: 37 mL/min — ABNORMAL LOW (ref >90)
Potassium: 4.6 mEq/L (ref 3.5–5.1)

## 2012-05-20 MED ORDER — HYDROCODONE-ACETAMINOPHEN 5-325 MG PO TABS
1.0000 | ORAL_TABLET | Freq: Four times a day (QID) | ORAL | Status: DC | PRN
  Administered 2012-05-21 – 2012-05-25 (×4): 1 via ORAL
  Filled 2012-05-20 (×4): qty 1

## 2012-05-20 MED ORDER — ASPIRIN EC 81 MG PO TBEC
81.0000 mg | DELAYED_RELEASE_TABLET | Freq: Every day | ORAL | Status: DC
  Administered 2012-05-21 – 2012-05-25 (×5): 81 mg via ORAL
  Filled 2012-05-20 (×5): qty 1

## 2012-05-20 MED ORDER — SODIUM CHLORIDE 0.9 % IV BOLUS (SEPSIS)
500.0000 mL | Freq: Once | INTRAVENOUS | Status: AC
  Administered 2012-05-20: 500 mL via INTRAVENOUS

## 2012-05-20 MED ORDER — DIPHENHYDRAMINE HCL (SLEEP) 25 MG PO TABS: 25.0000 mg | ORAL_TABLET | Freq: Every evening | ORAL | Status: DC | PRN

## 2012-05-20 MED ORDER — FLUTICASONE PROPIONATE 50 MCG/ACT NA SUSP: 1.0000 | Freq: Every day | NASAL | Status: DC | PRN

## 2012-05-20 MED ORDER — ACETAMINOPHEN 650 MG RE SUPP: 650.0000 mg | Freq: Four times a day (QID) | RECTAL | Status: DC | PRN

## 2012-05-20 MED ORDER — DIPHENHYDRAMINE HCL 25 MG PO CAPS
25.0000 mg | ORAL_CAPSULE | Freq: Every evening | ORAL | Status: DC | PRN
  Administered 2012-05-22 – 2012-05-24 (×2): 25 mg via ORAL
  Filled 2012-05-20 (×2): qty 1

## 2012-05-20 MED ORDER — PROMETHAZINE HCL 25 MG PO TABS: 12.5000 mg | ORAL_TABLET | Freq: Three times a day (TID) | ORAL | Status: DC | PRN

## 2012-05-20 MED ORDER — VANCOMYCIN HCL 10 G IV SOLR
1500.0000 mg | INTRAVENOUS | Status: AC
  Administered 2012-05-20: 1500 mg via INTRAVENOUS
  Filled 2012-05-20: qty 1500

## 2012-05-20 MED ORDER — SODIUM CHLORIDE 0.9 % IV BOLUS (SEPSIS): 500.0000 mL | Freq: Once | INTRAVENOUS | Status: DC

## 2012-05-20 MED ORDER — FOLIC ACID 1 MG PO TABS
1.0000 mg | ORAL_TABLET | Freq: Every day | ORAL | Status: DC
  Administered 2012-05-21 – 2012-05-25 (×5): 1 mg via ORAL
  Filled 2012-05-20 (×5): qty 1

## 2012-05-20 MED ORDER — MECLIZINE HCL 25 MG PO TABS
25.0000 mg | ORAL_TABLET | Freq: Three times a day (TID) | ORAL | Status: DC | PRN
  Filled 2012-05-20: qty 1

## 2012-05-20 MED ORDER — SODIUM CHLORIDE 0.9 % IV SOLN: 1750.0000 mg | Freq: Once | INTRAVENOUS | Status: DC

## 2012-05-20 MED ORDER — INSULIN ASPART 100 UNIT/ML ~~LOC~~ SOLN
0.0000 [IU] | Freq: Three times a day (TID) | SUBCUTANEOUS | Status: DC
  Administered 2012-05-21: 17:00:00 via SUBCUTANEOUS
  Administered 2012-05-21: 3 [IU] via SUBCUTANEOUS
  Administered 2012-05-21: 2 [IU] via SUBCUTANEOUS
  Administered 2012-05-22: 5 [IU] via SUBCUTANEOUS
  Administered 2012-05-22: 7 [IU] via SUBCUTANEOUS
  Administered 2012-05-22: 3 [IU] via SUBCUTANEOUS
  Administered 2012-05-23: 5 [IU] via SUBCUTANEOUS
  Administered 2012-05-23: 7 [IU] via SUBCUTANEOUS
  Administered 2012-05-23: 5 [IU] via SUBCUTANEOUS
  Administered 2012-05-24: 7 [IU] via SUBCUTANEOUS
  Administered 2012-05-24: 5 [IU] via SUBCUTANEOUS
  Administered 2012-05-24: 7 [IU] via SUBCUTANEOUS
  Administered 2012-05-25 (×2): 5 [IU] via SUBCUTANEOUS

## 2012-05-20 MED ORDER — ONDANSETRON HCL 4 MG PO TABS
4.0000 mg | ORAL_TABLET | Freq: Four times a day (QID) | ORAL | Status: DC | PRN
  Administered 2012-05-24: 4 mg via ORAL
  Filled 2012-05-20: qty 1

## 2012-05-20 MED ORDER — SODIUM CHLORIDE 0.9 % IV SOLN
INTRAVENOUS | Status: AC
  Administered 2012-05-21 (×2): via INTRAVENOUS

## 2012-05-20 MED ORDER — SODIUM CHLORIDE 0.9 % IJ SOLN
3.0000 mL | Freq: Two times a day (BID) | INTRAMUSCULAR | Status: DC
  Administered 2012-05-21 – 2012-05-24 (×8): 3 mL via INTRAVENOUS

## 2012-05-20 MED ORDER — ENOXAPARIN SODIUM 40 MG/0.4ML ~~LOC~~ SOLN
40.0000 mg | SUBCUTANEOUS | Status: DC
  Administered 2012-05-21 – 2012-05-24 (×4): 40 mg via SUBCUTANEOUS
  Filled 2012-05-20 (×5): qty 0.4

## 2012-05-20 MED ORDER — NITROGLYCERIN 0.4 MG SL SUBL
0.4000 mg | SUBLINGUAL_TABLET | SUBLINGUAL | Status: DC | PRN
  Administered 2012-05-21 – 2012-05-23 (×9): 0.4 mg via SUBLINGUAL
  Filled 2012-05-20 (×2): qty 75

## 2012-05-20 MED ORDER — ONDANSETRON HCL 4 MG/2ML IJ SOLN: 4.0000 mg | Freq: Four times a day (QID) | INTRAMUSCULAR | Status: DC | PRN

## 2012-05-20 MED ORDER — INSULIN GLARGINE 100 UNIT/ML ~~LOC~~ SOLN
65.0000 [IU] | Freq: Every day | SUBCUTANEOUS | Status: DC
  Administered 2012-05-21: 65 [IU] via SUBCUTANEOUS
  Filled 2012-05-20 (×2): qty 0.65

## 2012-05-20 MED ORDER — LEVOTHYROXINE SODIUM 112 MCG PO TABS
56.0000 ug | ORAL_TABLET | Freq: Every day | ORAL | Status: DC
  Administered 2012-05-21 – 2012-05-25 (×5): 56 ug via ORAL
  Filled 2012-05-20 (×6): qty 0.5

## 2012-05-20 MED ORDER — ACETAMINOPHEN 325 MG PO TABS
650.0000 mg | ORAL_TABLET | Freq: Four times a day (QID) | ORAL | Status: DC | PRN
  Administered 2012-05-22: 650 mg via ORAL
  Filled 2012-05-20: qty 2

## 2012-05-20 MED ORDER — DEXTROSE 5 % IV SOLN
1.0000 g | INTRAVENOUS | Status: DC
  Filled 2012-05-20: qty 1

## 2012-05-20 MED ORDER — CLOPIDOGREL BISULFATE 75 MG PO TABS
75.0000 mg | ORAL_TABLET | Freq: Every day | ORAL | Status: DC
  Administered 2012-05-21 – 2012-05-25 (×5): 75 mg via ORAL
  Filled 2012-05-20 (×4): qty 1

## 2012-05-20 MED ORDER — DEXTROSE 5 % IV SOLN
1.0000 g | INTRAVENOUS | Status: AC
  Administered 2012-05-20: 1 g via INTRAVENOUS
  Filled 2012-05-20: qty 1

## 2012-05-20 MED ORDER — OMEGA-3-ACID ETHYL ESTERS 1 G PO CAPS
2.0000 g | ORAL_CAPSULE | Freq: Two times a day (BID) | ORAL | Status: DC
  Administered 2012-05-21 – 2012-05-25 (×8): 2 g via ORAL
  Filled 2012-05-20 (×10): qty 2

## 2012-05-20 MED ORDER — VANCOMYCIN HCL IN DEXTROSE 1-5 GM/200ML-% IV SOLN
1000.0000 mg | INTRAVENOUS | Status: DC
  Administered 2012-05-21: 1000 mg via INTRAVENOUS
  Filled 2012-05-20 (×2): qty 200

## 2012-05-20 MED ORDER — ASPIRIN EC 81 MG PO TBEC: 81.0000 mg | DELAYED_RELEASE_TABLET | Freq: Every day | ORAL | Status: DC

## 2012-05-20 NOTE — Progress Notes (Addendum)
ANTIBIOTIC CONSULT NOTE - INITIAL  Pharmacy Consult for Cefepime Indication: rule out sepsis  Allergies  Allergen Reactions  . Atorvastatin Other (See Comments)    Unknown-patient admitted to hospital after taking  . Citrus Dermatitis    BREAK OUT ON BODY  . Iodine   . Iohexol      Desc: ANAPHYLAXIS   . Zolpidem Tartrate     REACTION: hallucinations  . Milk-Related Compounds Rash    Patient Measurements: Height: 5\' 5"  (165.1 cm) Weight: 182 lb 1.6 oz (82.6 kg) IBW/kg (Calculated) : 57  Vital Signs: Temp: 98.3 F (36.8 C) (05/01 1849) Temp src: Oral (05/01 1849) BP: 100/52 mmHg (05/01 1849) Pulse Rate: 86 (05/01 1849) Intake/Output from previous day:   Intake/Output from this shift:    Labs:  Recent Labs  05/20/12 1723  WBC 13.9*  HGB 14.7  PLT 267  CREATININE 1.31*   Estimated Creatinine Clearance: 36.3 ml/min (by C-G formula based on Cr of 1.31). No results found for this basename: VANCOTROUGH, Leodis Binet, VANCORANDOM, GENTTROUGH, GENTPEAK, GENTRANDOM, TOBRATROUGH, TOBRAPEAK, TOBRARND, AMIKACINPEAK, AMIKACINTROU, AMIKACIN,  in the last 72 hours   Microbiology: Recent Results (from the past 720 hour(s))  URINE CULTURE     Status: None   Collection Time    04/20/12  8:00 PM      Result Value Range Status   Specimen Description URINE, RANDOM   Final   Special Requests NONE   Final   Culture  Setup Time 04/21/2012 03:10   Final   Colony Count >=100,000 COLONIES/ML   Final   Culture PSEUDOMONAS AERUGINOSA   Final   Report Status 04/23/2012 FINAL   Final   Organism ID, Bacteria PSEUDOMONAS AERUGINOSA   Final    Medical History: Past Medical History  Diagnosis Date  . Hypertension     Unspecified  . Hyperlipidemia     Mixed  . Coronary artery disease 08/21/09    a. Catheterization 2006 occluded OM vein graft b.   nuclear 2008 question slight ischemia c. anterior MI 04/15/12 s/p PTCA-LCx  . S/P CABG (coronary artery bypass graft) 2003     2003  LIMA,  LAD, SVG to the OM, SVG right coronary artery  . Aneurysm 2003    Ascending thoracic aneurysm repair with a graft at time of CABG  . Diabetes mellitus     Insulin dependent  . Gastroparesis   . Depression   . Allergy history, radiographic dye     Contrast dye allergy  . Ejection fraction     EF 50%  //  Ejection fraction 25-30% at the time of stress echo,  May, 2013  . Previous back surgery   . Syncope 2008    Question?  2008  . Statin intolerance   . Shortness of breath     Exertional shortness of breath, May, 2013  . Systolic CHF     June, 2013  . Ischemic cardiomyopathy 06/2011    a. Echo 04/19/12: EF 25-30%, mod LVH, diffuse HK, trivial AI  . Thyroid disease   . CHF (congestive heart failure)   . Nausea     October, 2013  . Hypotension     October, 2013  . Hypothyroidism     Treated with low-dose Synthroid    Assessment: 79 y.o. F who presented to the [facility] on 05/20/12 with hypotension. Pharmacy was consulted to start Cefepime along with Vanc x 1 dose per MD for empiric r/o sepsis coverage. Wt: 82.6 kg, SCr 1.31, CrCl~35-40 ml/min.  Cefepime was handed to the nurse with the instructions to be hung first. Lab was in the room drawing blood cultures at the time. The nurse is aware it is to be hung immediately after these are completed.   Vanc x 1 dose was entered by the EDP -- will reduce this dose slightly given the patient's age and weight and will follow-up with any additional dosing needs.  Goal of Therapy:  Empiric sepsis antibiotics to be given within 1 hour Proper antibiotics for infection/cultures adjusted for renal/hepatic function   Plan:  1. Cefepime 1g IV x 1 dose now followed by 1g IV every 24 hours 2. Adjust Vanc slightly to 1500 mg x 1 dose to load 3. Will continue to follow renal function, culture results, LOT, and antibiotic de-escalation plans   Georgina Pillion, PharmD, BCPS Clinical Pharmacist Pager: [phone] 05/20/2012 7:11 PM     Addendum:  Pharmacy consulted to manage vancomycin. Vancomycin 1gm IV Q24H.   Lorre Munroe, PharmD, BCPS 05/20/2012, 23:59

## 2012-05-20 NOTE — ED Notes (Signed)
Pt states that when pain does come in the left chest, she take nitroglycerin and hydrocodone and pain resolves; hydrocodone was taken today 1:30 pm

## 2012-05-20 NOTE — H&P (Signed)
[facility] History and Physical  Cathy [name]:[id number] DOB: Apr 17, 1930 DOA: 05/20/2012  Referring physician: ER physician. PCP: Milinda Antis, MD  Specialists: Dr. Myrtis Ser. Cardiologist.  Chief Complaint: Weakness.  HPI: Cathy [name] is a 79 y.o. female who was recently admitted last month for acute ST elevation MI and had cardiac catheter stent placed had gone for regular followup with her primary care physician. Over there patient was found to be weak and hypotensive and was transferred to the ER for further workup. Patient states over the last 2 days patient has been feeling nauseated and has had subjective feeling of fever and chills. Patient was feeling very weak and also has been having chest pain. Patient states that her chest pain has been there off and on since last month. Denies any associated shortness of breath productive cough or abdominal pain. In the ER patient was found to be hypotensive and labs show leukocytosis and elevated lactic acid levels. UA was compatible with UTI. EKG was showing some lateral T wave inversion but cardiac enzymes were negative. Patient has been given a total of 1.5 L normal saline bolus. At this time patient pressure has improved. Patient will be admitted for further management of her sepsis most likely secondary UTI. Patient states since her discharge last month patient has been taking ciprofloxacin for UTI. She has taken 2 courses of ciprofloxacin for a total of 15 days. Patient states she has chronic diarrhea from previous colectomy for endometriosis. Has noticed no blood in the diarrhea.   Review of Systems: As presented in the history of presenting illness, rest negative.  Past Medical History  Diagnosis Date  . Hypertension     Unspecified  . Hyperlipidemia     Mixed  . Coronary artery disease 08/21/09    a. Catheterization 2006 occluded OM vein graft b.   nuclear 2008 question slight ischemia c. anterior MI 04/15/12 s/p PTCA-LCx  .  S/P CABG (coronary artery bypass graft) 2003     2003  LIMA, LAD, SVG to the OM, SVG right coronary artery  . Aneurysm 2003    Ascending thoracic aneurysm repair with a graft at time of CABG  . Diabetes mellitus     Insulin dependent  . Gastroparesis   . Depression   . Allergy history, radiographic dye     Contrast dye allergy  . Ejection fraction     EF 50%  //  Ejection fraction 25-30% at the time of stress echo,  May, 2013  . Previous back surgery   . Syncope 2008    Question?  2008  . Statin intolerance   . Shortness of breath     Exertional shortness of breath, May, 2013  . Systolic CHF     June, 2013  . Ischemic cardiomyopathy 06/2011    a. Echo 04/19/12: EF 25-30%, mod LVH, diffuse HK, trivial AI  . Thyroid disease   . CHF (congestive heart failure)   . Nausea     October, 2013  . Hypotension     October, 2013  . Hypothyroidism     Treated with low-dose Synthroid   Past Surgical History  Procedure Laterality Date  . Coronary artery bypass graft  2003     LIMA, LAD, SVG to the OM, SVG right coronary artery  . Bladder tacking    . Salivary gland surgery    . Total abdominal hysterectomy    . Back surgery    . Breast surgery    .  External ear surgery    . Cardiac catheterization  04/15/12    60-70% proximal LAD, 95% mid AV groove circumflex s/p PTCA, proximal RCA occlusion, patent LIMA to LAD, patent SVG to distal RCA, chronically occluded SVG to circumflex; LVEF 25-30%   Social History:  reports that she has never smoked. She has never used smokeless tobacco. She reports that she does not drink alcohol or use illicit drugs.  lives at home. where does patient live-- can do ADLs. Can patient participate in ADLs?  Allergies  Allergen Reactions  . Atorvastatin Other (See Comments)    Unknown-patient admitted to hospital after taking  . Citrus Dermatitis    BREAK OUT ON BODY  . Iodine   . Iohexol      Desc: ANAPHYLAXIS   . Zolpidem Tartrate     REACTION:  hallucinations  . Milk-Related Compounds Rash    Family History  Problem Relation Age of Onset  . Heart disease Other   . Heart disease Mother   . Hypertension Mother   . Heart disease Father   . Hypertension Father       Prior to Admission medications   Medication Sig Start Date End Date Taking? Authorizing Provider  aspirin EC 81 MG tablet Take 1 tablet (81 mg total) by mouth daily. 04/28/12  Yes Prescott Parma, PA-C  carvedilol (COREG) 25 MG tablet Take 12.5 mg by mouth 2 (two) times daily with a meal. 12/09/11  Yes Luis Abed, MD  cetirizine (ZYRTEC) 10 MG tablet TAKE ONE TABLET DAILY. 02/23/12  Yes Salley Scarlet, MD  ciprofloxacin (CIPRO) 500 MG tablet Take 1 tablet (500 mg total) by mouth 2 (two) times daily. 04/28/12  Yes Prescott Parma, PA-C  clopidogrel (PLAVIX) 75 MG tablet Take 1 tablet (75 mg total) by mouth daily. 05/12/12  Yes Luis Abed, MD  diphenhydrAMINE (SOMINEX) 25 MG tablet Take 25 mg by mouth at bedtime as needed for sleep.   Yes Historical Provider, MD  fluticasone (FLONASE) 50 MCG/ACT nasal spray Place 1 spray into the nose daily as needed. For allergies 08/21/11 08/20/12 Yes Salley Scarlet, MD  folic acid (FOLVITE) 1 MG tablet Take 1 mg by mouth daily.   Yes Historical Provider, MD  furosemide (LASIX) 40 MG tablet Take 1 tablets by mouth in the morning and 1 tablet by mouth in the evening. 07/29/11  Yes Luis Abed, MD  HUMALOG 100 UNIT/ML injection INJECT 2 TO 10 UNITS SUBCUTANEOUSLY FOUR TIMES DAILY AS DIRECTED USING SLIDING SCALE. 03/12/12  Yes Salley Scarlet, MD  HYDROcodone-acetaminophen (NORCO/VICODIN) 5-325 MG per tablet Take 1 tablet by mouth every 6 (six) hours as needed for pain.   Yes Historical Provider, MD  insulin glargine (LANTUS) 100 UNIT/ML injection Inject 65 Units into the skin at bedtime.   Yes Historical Provider, MD  isosorbide mononitrate (IMDUR) 30 MG 24 hr tablet Take 30 mg by mouth daily. 09/10/11  Yes Luis Abed, MD  levothyroxine  (SYNTHROID, LEVOTHROID) 112 MCG tablet Take 56 mcg by mouth daily.   Yes Historical Provider, MD  loperamide (IMODIUM) 2 MG capsule Take 2 mg by mouth 4 (four) times daily as needed for diarrhea or loose stools.   Yes Historical Provider, MD  meclizine (ANTIVERT) 25 MG tablet Take 25 mg by mouth 3 (three) times daily as needed for dizziness.   Yes Historical Provider, MD  metFORMIN (GLUCOPHAGE) 500 MG tablet TAKE 2 TABLETS TWICE DAILY WITH FOOD AS DIRECTED. 03/10/12  Yes  Salley Scarlet, MD  nitroGLYCERIN (NITROSTAT) 0.4 MG SL tablet Place 0.4 mg under the tongue every 5 (five) minutes as needed for chest pain. May repeat times three.   Yes Historical Provider, MD  omega-3 acid ethyl esters (LOVAZA) 1 G capsule Take 2 capsules (2 g total) by mouth 2 (two) times daily. 03/19/12  Yes Salley Scarlet, MD  promethazine (PHENERGAN) 25 MG tablet TAKE 1 TABLET EVERY 4 TO 6 HOURS AS NEEDED FOR NAUSEA AND VOMITING. 03/23/12  Yes Salley Scarlet, MD  ramipril (ALTACE) 5 MG capsule Take 1 capsule (5 mg total) by mouth daily. 03/11/12  Yes Salley Scarlet, MD   Physical Exam: Filed Vitals:   05/20/12 2100 05/20/12 2115 05/20/12 2130 05/20/12 2145  BP: 101/64 104/50 105/51 108/52  Pulse: 82 81 77 77  Temp:      TempSrc:      Resp: 17 18 16 17   Height:      Weight:      SpO2: 98% 98% 98% 98%     General:  Well-developed and nourished.  Eyes:  Anicteric no pallor.  ENT:  No discharge from the ears eyes nose and mouth.  Neck:  No mass felt.  Cardiovascular:  S1-S2 heard.  Respiratory:  No rhonchi or crepitations.  Abdomen:  Soft nontender bowel sounds present.  Skin:  No rash.  Musculoskeletal:  No edema.  Psychiatric:  Appears normal.  Neurologic:  Alert awake oriented to time place and person. Moves all extremities.  Labs on Admission:  Basic Metabolic Panel:  Recent Labs Lab 05/20/12 1723  NA 142  K 4.6  CL 103  CO2 27  GLUCOSE 107*  BUN 22  CREATININE 1.31*  CALCIUM 10.3    Liver Function Tests:  Recent Labs Lab 05/20/12 2138  AST 12  ALT 6  ALKPHOS 59  BILITOT 0.3  PROT 6.0  ALBUMIN 2.9*    Recent Labs Lab 05/20/12 2138  LIPASE 27   No results found for this basename: AMMONIA,  in the last 168 hours CBC:  Recent Labs Lab 05/20/12 1723  WBC 13.9*  NEUTROABS 8.7*  HGB 14.7  HCT 43.0  MCV 88.1  PLT 267   Cardiac Enzymes:  Recent Labs Lab 05/20/12 1723 05/20/12 2139  TROPONINI <0.30 <0.30    BNP (last 3 results)  Recent Labs  10/24/11 1621  PROBNP 685.8*   CBG: No results found for this basename: GLUCAP,  in the last 168 hours  Radiological Exams on Admission: Dg Chest 2 View  05/20/2012  *RADIOLOGY REPORT*  Clinical Data: Chest pain.  Shortness of breath.  Nausea. Hypotension.  Myocardial infarction 1 month ago.  CHEST - 2 VIEW  Comparison: 04/16/2012  Findings: Prior CABG noted.  Borderline cardiomegaly.  No pulmonary edema noted.  The lungs appear grossly clear.  Reverse lordotic positioning employed on the frontal projection.  No definite pleural effusion observed although the left posterior costophrenic angle is clipped.  IMPRESSION:  1.  Borderline cardiomegaly.  No edema or specific additional acute radiographic abnormalities.   Original Report Authenticated By: Gaylyn Rong, M.D.     EKG: Independently reviewed.  Normal sinus rhythm with T-wave inversions in the lateral leads.  Assessment/Plan Principal Problem:   Sepsis Active Problems:   Coronary artery disease   Chest pain   Cardiomyopathy, ischemic   AKI (acute kidney injury)   Urinary tract infection   1. Sepsis from UTI - follow blood cultures and urine cultures. Patient has been  empirically started on vancomycin and cefepime which will be continued until cultures are available. Check stool studies due to diarrhea. Continue with gentle hydration. Hold off antihypertensives for now. 2. Chest pain with history of CAD status post recent stenting for ST  elevation MI and previous history of CAD status post CABG and ascending aortic repair - EKG shows lateral T-wave changes. Cycle cardiac markers. Continue antiplatelet agents. For now I'm holding antihypertensives due to hypotension. 3. Nausea - patient's abdomen on exam appears benign. Check LFTs and lipase. Since patient is complaining of diarrhea we will check stool studies. 4. Ischemic cardiomyopathy - hydrate with caution. 5. Acute renal failure secondary to dehydration and hypotension - closely follow intake output and metabolic panel. 6. Hypothyroidism - continue Synthroid. 7. Diabetes mellitus type 2 - on sliding-scale insulin.    Code Status:  Full code.  Family Communication:  None.  Disposition Plan:  Admit for inpatient.    Dontarious Schaum N. [facility] Pager [phone].  If 7PM-7AM, please contact night-coverage [email or web address] Password [facility] 05/20/2012, 10:34 PM

## 2012-05-20 NOTE — ED Provider Notes (Signed)
History     CSN: [id number]  Arrival date & time 05/20/12  1641   First MD Initiated Contact with Patient 05/20/12 1703      Chief Complaint  Patient presents with  . Hypotension    (Consider location/radiation/quality/duration/timing/severity/associated sxs/prior treatment) HPI Comments: 33 y F with PMH of HTN, HLD, T2DM, Sys CHF, hypothyroidism, asc thoracic aneurysm s/p repair, CAD s/p CABG (2003 LIMA, LAD, SVG to the OM, SVG right coronary artery) with recent STEMI (S/p cardiac catheterization + PTCA only: 60-70% proximal LAD, 95% mid AV groove circumflex s/p PTCA, proximal RCA occlusion, patent LIMA to LAD, patent SVG to distal RCA, chronically occluded SVG to circumflex; LVEF 25-30%) here by EMS after referral from PCPs office for low BPs.  EMS gave 500 cc NS.  Pt reports intermittent CP, left sided, non-radiating that has been present for the past few days, none currently.  She has taken nitro and norco for this pain.  No CP currently.  No recent Nitro use.  She endorses good PO intake, no n/v.  On ROS she denies melena and hematochezia but does report chronic diarrhea from her colectomy.  No HA, CP, SOB, abd pain, back pain, dysuria or other complaints.  Patient is a 79 y.o. female presenting with general illness. The history is provided by the patient.  Illness  The current episode started today. The problem occurs continuously. The problem has been gradually worsening. The problem is moderate. Associated symptoms include diarrhea (chronic). Pertinent negatives include no fever, no photophobia, no vomiting, no headaches, no sore throat, no muscle aches, no cough and no rash.    Past Medical History  Diagnosis Date  . Hypertension     Unspecified  . Hyperlipidemia     Mixed  . Coronary artery disease 08/21/09    a. Catheterization 2006 occluded OM vein graft b.   nuclear 2008 question slight ischemia c. anterior MI 04/15/12 s/p PTCA-LCx  . S/P CABG (coronary artery bypass graft)  2003     2003  LIMA, LAD, SVG to the OM, SVG right coronary artery  . Aneurysm 2003    Ascending thoracic aneurysm repair with a graft at time of CABG  . Diabetes mellitus     Insulin dependent  . Gastroparesis   . Depression   . Allergy history, radiographic dye     Contrast dye allergy  . Ejection fraction     EF 50%  //  Ejection fraction 25-30% at the time of stress echo,  May, 2013  . Previous back surgery   . Syncope 2008    Question?  2008  . Statin intolerance   . Shortness of breath     Exertional shortness of breath, May, 2013  . Systolic CHF     June, 2013  . Ischemic cardiomyopathy 06/2011    a. Echo 04/19/12: EF 25-30%, mod LVH, diffuse HK, trivial AI  . Thyroid disease   . CHF (congestive heart failure)   . Nausea     October, 2013  . Hypotension     October, 2013  . Hypothyroidism     Treated with low-dose Synthroid    Past Surgical History  Procedure Laterality Date  . Coronary artery bypass graft  2003     LIMA, LAD, SVG to the OM, SVG right coronary artery  . Bladder tacking    . Salivary gland surgery    . Total abdominal hysterectomy    . Back surgery    . Breast surgery    .  External ear surgery    . Cardiac catheterization  04/15/12    60-70% proximal LAD, 95% mid AV groove circumflex s/p PTCA, proximal RCA occlusion, patent LIMA to LAD, patent SVG to distal RCA, chronically occluded SVG to circumflex; LVEF 25-30%    Family History  Problem Relation Age of Onset  . Heart disease Other   . Heart disease Mother   . Hypertension Mother   . Heart disease Father   . Hypertension Father     History  Substance Use Topics  . Smoking status: Never Smoker   . Smokeless tobacco: Never Used  . Alcohol Use: No    OB History   Grav Para Term Preterm Abortions TAB SAB Ect Mult Living                  Review of Systems  Constitutional: Negative for fever.  HENT: Negative for sore throat.   Eyes: Negative for photophobia.  Respiratory:  Negative for cough.   Gastrointestinal: Positive for diarrhea (chronic). Negative for vomiting.  Skin: Negative for rash.  Neurological: Negative for headaches.  All other systems reviewed and are negative.    Allergies  Atorvastatin; Citrus; Iodine; Iohexol; Zolpidem tartrate; and Milk-related compounds  Home Medications   Current Outpatient Rx  Name  Route  Sig  Dispense  Refill  . aspirin EC 81 MG tablet   Oral   Take 1 tablet (81 mg total) by mouth daily.           PATIENT RESIDES AT [facility]   . carvedilol (COREG) 25 MG tablet   Oral   Take 12.5 mg by mouth 2 (two) times daily with a meal.         . cetirizine (ZYRTEC) 10 MG tablet      TAKE ONE TABLET DAILY.   30 tablet   2   . ciprofloxacin (CIPRO) 500 MG tablet   Oral   Take 1 tablet (500 mg total) by mouth 2 (two) times daily.   20 tablet   0     PATIENT RESIDES AT [facility]   . clopidogrel (PLAVIX) 75 MG tablet   Oral   Take 1 tablet (75 mg total) by mouth daily.   30 tablet   6     PLEASE DELIVER TO PATIENT'S HOME   . diphenhydrAMINE (SOMINEX) 25 MG tablet   Oral   Take 25 mg by mouth at bedtime as needed for sleep.         . fluticasone (FLONASE) 50 MCG/ACT nasal spray   Nasal   Place 1 spray into the nose daily as needed. For allergies         . folic acid (FOLVITE) 1 MG tablet   Oral   Take 1 mg by mouth daily.         . furosemide (LASIX) 40 MG tablet      Take 1 tablets by mouth in the morning and 1 tablet by mouth in the evening.         [name] HUMALOG 100 UNIT/ML injection      INJECT 2 TO 10 UNITS SUBCUTANEOUSLY FOUR TIMES DAILY AS DIRECTED USING SLIDING SCALE.   10 mL   1   . HYDROcodone-acetaminophen (NORCO/VICODIN) 5-325 MG per tablet   Oral   Take 1 tablet by mouth every 6 (six) hours as needed for pain.         [name] insulin glargine (LANTUS) 100 UNIT/ML injection  Subcutaneous   Inject 65 Units into the skin at bedtime.          . isosorbide mononitrate (IMDUR) 30 MG 24 hr tablet   Oral   Take 30 mg by mouth daily.         [name] levothyroxine (SYNTHROID, LEVOTHROID) 112 MCG tablet   Oral   Take 56 mcg by mouth daily.         [name] loperamide (IMODIUM) 2 MG capsule   Oral   Take 2 mg by mouth 4 (four) times daily as needed for diarrhea or loose stools.         . meclizine (ANTIVERT) 25 MG tablet      TAKE (1) TABLET BY MOUTH (3) TIMES DAILY AS NEEDED.   90 tablet   1   . metFORMIN (GLUCOPHAGE) 500 MG tablet      TAKE 2 TABLETS TWICE DAILY WITH FOOD AS DIRECTED.   270 tablet   1   . nitroGLYCERIN (NITROSTAT) 0.4 MG SL tablet   Sublingual   Place 0.4 mg under the tongue every 5 (five) minutes as needed. May repeat times three.          . omega-3 acid ethyl esters (LOVAZA) 1 G capsule   Oral   Take 2 capsules (2 g total) by mouth 2 (two) times daily.   60 capsule   6   . promethazine (PHENERGAN) 25 MG tablet      TAKE 1 TABLET EVERY 4 TO 6 HOURS AS NEEDED FOR NAUSEA AND VOMITING.   30 tablet   0   . ramipril (ALTACE) 5 MG capsule   Oral   Take 1 capsule (5 mg total) by mouth daily.   30 capsule   3     BP 77/47  Pulse 86  Temp(Src) 97.8 F (36.6 C) (Oral)  Resp 18  SpO2 97%  Physical Exam  Vitals reviewed. Constitutional: She is oriented to person, place, and time. She appears well-developed and well-nourished.  Appears pale  HENT:  Right Ear: External ear normal.  Left Ear: External ear normal.  Mouth/Throat: No oropharyngeal exudate.  Eyes: Conjunctivae and EOM are normal. Pupils are equal, round, and reactive to light.  Neck: Normal range of motion. Neck supple.  Cardiovascular: Normal rate, regular rhythm, normal heart sounds and intact distal pulses.  Exam reveals no gallop and no friction rub.   No murmur heard. Pulmonary/Chest: Effort normal and breath sounds normal.  Crackles on left  Abdominal: Soft. Bowel sounds are normal. She exhibits no distension. There  is no tenderness.  Musculoskeletal: Normal range of motion. She exhibits no edema.  Neurological: She is alert and oriented to person, place, and time. No cranial nerve deficit.  Skin: Skin is warm and dry. No rash noted.  Psychiatric: She has a normal mood and affect.    ED Course  Procedures (including critical care time)  Labs Reviewed  CBC WITH DIFFERENTIAL - Abnormal; Notable for the following:    WBC 13.9 (*)    Neutro Abs 8.7 (*)    Lymphs Abs 4.1 (*)    All other components within normal limits  BASIC METABOLIC PANEL - Abnormal; Notable for the following:    Glucose, Bld 107 (*)    Creatinine, Ser 1.31 (*)    GFR calc non Af Amer 37 (*)    GFR calc Af Amer 43 (*)    All other components within normal limits  URINALYSIS, ROUTINE W REFLEX MICROSCOPIC - Abnormal;  Notable for the following:    Color, Urine AMBER (*)    APPearance TURBID (*)    Hgb urine dipstick MODERATE (*)    Bilirubin Urine SMALL (*)    Ketones, ur 15 (*)    Protein, ur 30 (*)    Leukocytes, UA LARGE (*)    All other components within normal limits  URINE MICROSCOPIC-ADD ON - Abnormal; Notable for the following:    Squamous Epithelial / LPF MANY (*)    Bacteria, UA MANY (*)    Crystals CA OXALATE CRYSTALS (*)    All other components within normal limits  CG4 I-STAT (LACTIC ACID) - Abnormal; Notable for the following:    Lactic Acid, Venous 4.76 (*)    All other components within normal limits  CG4 I-STAT (LACTIC ACID) - Abnormal; Notable for the following:    Lactic Acid, Venous 3.84 (*)    All other components within normal limits  URINE CULTURE  CULTURE, BLOOD (ROUTINE X 2)  CULTURE, BLOOD (ROUTINE X 2)  TROPONIN I  OCCULT BLOOD, POC DEVICE   Dg Chest 2 View  05/20/2012  *RADIOLOGY REPORT*  Clinical Data: Chest pain.  Shortness of breath.  Nausea. Hypotension.  Myocardial infarction 1 month ago.  CHEST - 2 VIEW  Comparison: 04/16/2012  Findings: Prior CABG noted.  Borderline cardiomegaly.  No  pulmonary edema noted.  The lungs appear grossly clear.  Reverse lordotic positioning employed on the frontal projection.  No definite pleural effusion observed although the left posterior costophrenic angle is clipped.  IMPRESSION:  1.  Borderline cardiomegaly.  No edema or specific additional acute radiographic abnormalities.   Original Report Authenticated By: Gaylyn Rong, M.D.     Date: 05/20/2012  Rate: 86  Rhythm: normal sinus rhythm  QRS Axis: left  Intervals: normal  ST/T Wave abnormalities: T wave inversion in lateral and anterolateral leads likely secondary to left ventricular hypertrophy  Conduction Disutrbances:none  Narrative Interpretation: Left axis deviation, left ventricular hypertrophy, T-wave inversion in lateral and anterolateral leads likely secondary to left ventricular hypertrophy. When compared with ECG of 04/21/2012, no significant changes are seen.  Old EKG Reviewed: unchanged  1. Severe sepsis with acute organ dysfunction   2. Urinary tract infection   3. Hypotension   4. Leukocytosis   5. AKI (acute kidney injury)   6. Cardiomyopathy, ischemic   7. Chest pain   8. Sepsis   9. UTI (urinary tract infection)       MDM    49 y F with PMH of HTN, HLD, T2DM, Sys CHF, hypothyroidism, asc thoracic aneurysm s/p repair, CAD s/p CABG (2003 LIMA, LAD, SVG to the OM, SVG right coronary artery) with recent STEMI (S/p cardiac catheterization + PTCA only: 60-70% proximal LAD, 95% mid AV groove circumflex s/p PTCA, proximal RCA occlusion, patent LIMA to LAD, patent SVG to distal RCA, chronically occluded SVG to circumflex; LVEF 25-30%) here by EMS after referral from PCPs office for low BPs.  EMS gave 500 cc NS.  Pt reports intermittent CP, left sided, non-radiating that has been present for the past few days, none currently.  She has taken nitro and norco for this pain.  No CP currently.  No recent Nitro use.  She endorses good PO intake, no n/v.  On ROS she denies  melena and hematochezia but does report chronic diarrhea from her colectomy.  No HA, CP, SOB, abd pain, back pain, dysuria or other complaints.  Pt appears pale but is mentating appropriately.  Lungs with crackles on left.  Abd soft, NT.  No signficant edema.  Rectal exam with brown stool.  Diff Dx: Dehydration, Acute anemia, Sepsis  CBC, BMP, trop, XR chest, EKG, trop, lactate, 500 cc bolus.  6:45 PM Lactate returned at 4.7.  Code Sepsis called.  Urine appears to be the most likely source at this point.  During her last hospitalization she grew Pseudomonas that was mostly sensitive and reports finishing up Cipro approx 3 days ago.  Will tx with Vanc/Cefepime for now.  Blood Cx.  BPs improved.  8:50 PM Repeat lactate trending in right direction, now 3.8.  Pt is felt to be stable for stepdown.  Hospitalist consulted for admission.  Additional 500 cc bolus given.  Disposition: Admit  Condition: Stable  Pt seen in conjunction with my attending, Dr. Preston Fleeting.  Oleh Genin, MD PGY-II [facility] Emergency Medicine Resident      Oleh Genin, MD 05/21/12 [phone]

## 2012-05-20 NOTE — Assessment & Plan Note (Signed)
No Nitro secondary to hypotension

## 2012-05-20 NOTE — Assessment & Plan Note (Signed)
Hypotensive, diaphoretic, EKG likely repolarization, no meds given EMS to transport to [facility] for further work-up Pt ideally should be placed back in SNF unable to care for self at home

## 2012-05-20 NOTE — ED Notes (Signed)
[facility] presents with a 79 yo female coming from her physicians' office with low BP and dizziness over the past few days.  Pt had an MI March 27th of 2014 and hx of CHF, CABG and catheterization.  Pt also has hx. Of right carotid artery blockage at 90% and sees the [facility].  Pt received in transit to this facility 550 ml fluid bolus, 324 mg ASA.  Was pale and diaphoretic on scene but resolved in transit. Pt reported feeling dizzy past few days.

## 2012-05-20 NOTE — Progress Notes (Signed)
  Subjective:    Patient ID: Cathy [name], female    DOB: 01-08-1931, 79 y.o.   MRN: [id number]  HPI  History of present for routine visit when she was noted to be slumping over in her wheelchair. Upon her spring her back she was significantly clammy and pale she states she has felt bad all day. She was here with her son he states when he picked her up she looked sick. She's had chest pain on and off for the past couple weeks she has been nauseous. She took all her medications this morning. Initial blood pressure was 78/52 heart rate 92, her CBG was 135. She was very nauseous in the exam room however she did not have any emesis. EMS was called. The patient recently had a STEMI in March 2014.  Review of Systems   GEN-+ fatigue, fever, weight loss,+weakness, recent illness HEENT- denies eye drainage, change in vision, nasal discharge, CVS- + chest pain, palpitations RESP- denies SOB, cough, wheeze ABD- + N/ no V, change in stools, abd pain Neuro- denies headache, dizziness, syncope, seizure activity      Objective:   Physical Exam GEN- NAD, alert and oriented x3, sitting in wheelchair, very pale appearing, diaphoretic, repeat BP 90/62 HEENT- PERRL, EOMI, non injected sclera, pink conjunctiva, oropharynx clear CVS- mild tachycardia, no murmur RESP-CTAB decreased bases ABD-NABS,soft,NT,ND EXT- No edema, venous stasis changes Pulses- Radial 2+  EKG- NSR, LVH, wide QRS, ? Early repolarization ? elevation Lead V2 , compared to EKG 4/9        Assessment & Plan:

## 2012-05-20 NOTE — Assessment & Plan Note (Signed)
Meds taken today, transport for further evaluation Rule out infection, cardiac origin etc

## 2012-05-20 NOTE — Patient Instructions (Signed)
Vitals  78/52   Repeat 90/62  CBG 135  Pulse 92 Oxygen Sat 96% Dizziness, clammy,chest pain, feeling faint Took all meds this AM

## 2012-05-20 NOTE — ED Provider Notes (Signed)
79 year-old female has been having chest pain intermittently for the last 2 days. She's been taking nitroglycerin and hydrocodone which gave her relief. Today, she had nausea and noted she felt dizzy when she stood up. On exam, lungs are clear and heart has regular rate rhythm. She was hypotensive at triage blood pressure has come up.   Date: 05/20/2012  Rate: 86  Rhythm: normal sinus rhythm  QRS Axis: left  Intervals: normal  ST/T Wave abnormalities: T wave inversion in lateral and anterolateral leads likely secondary to left ventricular hypertrophy  Conduction Disutrbances:none  Narrative Interpretation: Left axis deviation, left ventricular hypertrophy, T-wave inversion in lateral and anterolateral leads likely secondary to left ventricular hypertrophy. When compared with ECG of 04/21/2012, no significant changes are seen.  Old EKG Reviewed: unchanged  CRITICAL CARE Performed by: ZOXWR,UEAVW   Total critical care time: 45 minutes  Critical care time was exclusive of separately billable procedures and treating other patients.  Critical care was necessary to treat or prevent imminent or life-threatening deterioration.  Critical care was time spent personally by me on the following activities: development of treatment plan with patient and/or surrogate as well as nursing, discussions with consultants, evaluation of patient's response to treatment, examination of patient, obtaining history from patient or surrogate, ordering and performing treatments and interventions, ordering and review of laboratory studies, ordering and review of radiographic studies, pulse oximetry and re-evaluation of patient's condition.   I saw and evaluated the patient, reviewed the resident's note and I agree with the findings and plan.   Dione Booze, MD 05/24/12 [phone]

## 2012-05-21 DIAGNOSIS — R6520 Severe sepsis without septic shock: Secondary | ICD-10-CM

## 2012-05-21 LAB — CBC WITH DIFFERENTIAL/PLATELET
Basophils Relative: 0 % (ref 0–1)
HCT: 35.7 % — ABNORMAL LOW (ref 36.0–46.0)
Hemoglobin: 12 g/dL (ref 12.0–15.0)
Lymphocytes Relative: 44 % (ref 12–46)
MCHC: 33.6 g/dL (ref 30.0–36.0)
Monocytes Relative: 9 % (ref 3–12)
Neutro Abs: 4.7 10*3/uL (ref 1.7–7.7)
Neutrophils Relative %: 45 % (ref 43–77)
RBC: 4.07 MIL/uL (ref 3.87–5.11)
WBC: 10.4 10*3/uL (ref 4.0–10.5)

## 2012-05-21 LAB — MRSA PCR SCREENING: MRSA by PCR: NEGATIVE

## 2012-05-21 LAB — CBC
HCT: 36.9 % (ref 36.0–46.0)
Platelets: 223 10*3/uL (ref 150–400)
RBC: 4.19 MIL/uL (ref 3.87–5.11)
RDW: 14.1 % (ref 11.5–15.5)
WBC: 10.8 10*3/uL — ABNORMAL HIGH (ref 4.0–10.5)

## 2012-05-21 LAB — GLUCOSE, CAPILLARY

## 2012-05-21 LAB — URINE CULTURE

## 2012-05-21 LAB — COMPREHENSIVE METABOLIC PANEL
BUN: 22 mg/dL (ref 6–23)
CO2: 23 mEq/L (ref 19–32)
Calcium: 9.1 mg/dL (ref 8.4–10.5)
Creatinine, Ser: 0.79 mg/dL (ref 0.50–1.10)
GFR calc Af Amer: 89 mL/min — ABNORMAL LOW (ref >90)
GFR calc non Af Amer: 77 mL/min — ABNORMAL LOW (ref >90)
Glucose, Bld: 226 mg/dL — ABNORMAL HIGH (ref 70–99)
Total Protein: 5.6 g/dL — ABNORMAL LOW (ref 6.0–8.3)

## 2012-05-21 LAB — CREATININE, SERUM
GFR calc Af Amer: 55 mL/min — ABNORMAL LOW (ref >90)
GFR calc non Af Amer: 48 mL/min — ABNORMAL LOW (ref >90)

## 2012-05-21 LAB — LACTIC ACID, PLASMA: Lactic Acid, Venous: 2.1 mmol/L (ref 0.5–2.2)

## 2012-05-21 LAB — TROPONIN I: Troponin I: <0.3 ng/mL (ref <0.30)

## 2012-05-21 MED ORDER — FUROSEMIDE 40 MG PO TABS
40.0000 mg | ORAL_TABLET | Freq: Two times a day (BID) | ORAL | Status: DC
  Administered 2012-05-22 – 2012-05-25 (×7): 40 mg via ORAL
  Filled 2012-05-21 (×10): qty 1

## 2012-05-21 MED ORDER — DEXTROSE 5 % IV SOLN
1.0000 g | INTRAVENOUS | Status: DC
  Administered 2012-05-21 – 2012-05-24 (×4): 1 g via INTRAVENOUS
  Filled 2012-05-21 (×5): qty 10

## 2012-05-21 MED ORDER — DEXTROSE 5 % IV SOLN
1.0000 g | Freq: Two times a day (BID) | INTRAVENOUS | Status: DC
  Administered 2012-05-21: 1 g via INTRAVENOUS
  Filled 2012-05-21 (×2): qty 1

## 2012-05-21 MED ORDER — CARVEDILOL 6.25 MG PO TABS
6.2500 mg | ORAL_TABLET | Freq: Two times a day (BID) | ORAL | Status: DC
  Administered 2012-05-22: 6.25 mg via ORAL
  Filled 2012-05-21 (×4): qty 1

## 2012-05-21 MED ORDER — INSULIN GLARGINE 100 UNIT/ML ~~LOC~~ SOLN
55.0000 [IU] | Freq: Every day | SUBCUTANEOUS | Status: DC
  Administered 2012-05-21: 55 [IU] via SUBCUTANEOUS
  Filled 2012-05-21 (×2): qty 0.55

## 2012-05-21 MED ORDER — ISOSORBIDE MONONITRATE ER 30 MG PO TB24
30.0000 mg | ORAL_TABLET | Freq: Every day | ORAL | Status: DC
  Administered 2012-05-21 – 2012-05-25 (×5): 30 mg via ORAL
  Filled 2012-05-21 (×5): qty 1

## 2012-05-21 NOTE — Progress Notes (Signed)
Utilization Review Completed. 05/21/2012  

## 2012-05-21 NOTE — Progress Notes (Signed)
Late entry: Pt transferred from ED at appox 2230, admitted to Rm 6741. Pt comes from home, recently d/c'd from rehab facility. She is ambulatory with standby assist. Placed on tele, no complaints of pain at this time. Pt states she feels fine when sitting down but when she stands the pain returns. Resting comfortably at this time, vitals stable, given a sandwich. Instructed to call for assistance before getting out of bed.  Upon review of orders, pt was supposed to be admitted to a Stepdown bed. Place request for new bed assignment and explained to pt why she is being moved. Report called to 2600 by charge nurse/Vickie who also transported pt to new Rm/2604

## 2012-05-21 NOTE — Progress Notes (Signed)
Inpatient Diabetes Program Recommendations  AACE/ADA: New Consensus Statement on Inpatient Glycemic Control (2013)  Target Ranges:  Prepandial:   less than 140 mg/dL      Peak postprandial:   less than 180 mg/dL (1-2 hours)      Critically ill patients:  140 - 180 mg/dL  Results for [name], [name] (MRN [id number]) as of 05/21/2012 14:05  Ref. Range 04/21/2012 07:34 04/21/2012 11:26 05/20/2012 22:49 05/21/2012 08:44 05/21/2012 12:14  Glucose-Capillary Latest Range: 70-99 mg/dL 782 (H) 956 (H) 213 (H) 195 (H) 217 (H)   Inpatient Diabetes Program Recommendations Insulin - Meal Coverage: consider adding Novolog 4 units TID with meals  Thank you  Cathy [name] BSN, RN,CDE Inpatient Diabetes Coordinator [phone] (team pager)

## 2012-05-21 NOTE — Progress Notes (Signed)
[facility] Progress Note [place] TEAM 1 - Stepdown/ICU TEAM   Cathy [name] JYN:[id number] DOB: November 05, 1930 DOA: 05/20/2012 PCP: Milinda Antis, MD  Brief narrative: 79 y.o. female who was admitted last month for acute ST elevation MI and had cardiac catheter w/ stent placed had gone for regular followup with her primary care physician. During said visit patient was found to be weak and hypotensive and was transferred to the ER for further workup. Patient stated over the last 2 days she had been feeling nauseated and had subjective feeling of fever and chills. Patient was feeling very weak and also had been having chest pain. Patient stated that her chest pain has been off and on since last month. Denied any associated shortness of breath productive cough or abdominal pain. In the ER patient was found to be hypotensive and labs showed leukocytosis and elevated lactic acid levels. UA was compatible with UTI. EKG was showing some lateral T wave inversions but cardiac enzymes were negative. Patient was given a total of 1.5 L normal saline bolus. Blood pressure improved.  Patient stated since her discharge last month patient had been taking ciprofloxacin for UTI. She had taken 2 courses of ciprofloxacin for a total of 15 days. Patient stated she has chronic diarrhea from previous colectomy for endometriosis. Had noticed no blood in the diarrhea.   Assessment/Plan:  Hypotension w/ lactic acidosis Likely due to dehydration plus possible "early sepsis" - improving rapidly with volume expansion  Urinary tract infection Suspect drug-resistant gram-negative organism - followup culture data when available - continue empiric antibiotic coverage  Coronary artery disease w/ anterior MI 04/15/12 s/p PTCA-LCx  S/p CABG 2003 - troponin negative x3 - no chest pain - remains on aspirin and Plavix  Cardiomyopathy, ischemic Echo 04/19/12: EF 25-30%, mod LVH, diffuse HK, trivial AI - appears well compensated  at present - follow very closely due to need for volume expansion - watch daily weight and Is & Os  AKI (acute kidney injury) GFR 55 at admit - now 80 - recheck in a.m.  HTN Blood pressure rebounding with volume - slowly begin to resume home meds  HLD Reported intolerance to statins - diet controlled only  DM CBG is trending up - adjust treatment and follow  Hypothyroidsim TSH is actually somewhat over suppressed - will check free T4  Code Status: FULL Family Communication: No family present at time of visit Disposition Plan: SDU  Consultants: none  Procedures: none  Antibiotics: Cefepime 5/1 >> 5/2 Vancomycin 5/1 Rocephin 5/2 >>  DVT prophylaxis: Lovenox  HPI/Subjective: The patient is awake and oriented.  She admits that she is quite hungry.  She denies chest pain shortness of breath fevers chills nausea or vomiting at the present time.  Objective: Blood pressure 131/73, pulse 79, temperature 98 F (36.7 C), temperature source Oral, resp. rate 19, height 5\' 5"  (1.651 m), weight 87 kg (191 lb 12.8 oz), SpO2 97.00%.  Intake/Output Summary (Last 24 hours) at 05/21/12 1536 Last data filed at 05/21/12 1300  Gross per 24 hour  Intake 2508.75 ml  Output   1750 ml  Net 758.75 ml   Exam: General: No acute respiratory distress Lungs: Clear to auscultation bilaterally without wheezes or crackles Cardiovascular: Regular rate and rhythm without murmur gallop or rub - normal S1 and S2 Abdomen: Nontender, nondistended, soft, bowel sounds positive, no rebound, no ascites, no appreciable mass Extremities: No significant cyanosis, clubbing, or edema bilateral lower extremities  Data Reviewed: Basic Metabolic Panel:  Recent Labs Lab 05/20/12 1723 05/20/12 2355 05/21/12 0525  NA 142  --  139  K 4.6  --  3.8  CL 103  --  107  CO2 27  --  23  GLUCOSE 107*  --  226*  BUN 22  --  22  CREATININE 1.31* 1.07 0.79  CALCIUM 10.3  --  9.1   Liver Function Tests:  Recent  Labs Lab 05/20/12 2138 05/21/12 0525  AST 12 10  ALT 6 6  ALKPHOS 59 62  BILITOT 0.3 0.3  PROT 6.0 5.6*  ALBUMIN 2.9* 2.5*    Recent Labs Lab 05/20/12 2138  LIPASE 27   CBC:  Recent Labs Lab 05/20/12 1723 05/20/12 2355 05/21/12 0525  WBC 13.9* 10.8* 10.4  NEUTROABS 8.7*  --  4.7  HGB 14.7 12.1 12.0  HCT 43.0 36.9 35.7*  MCV 88.1 88.1 87.7  PLT 267 223 205   Cardiac Enzymes:  Recent Labs Lab 05/20/12 1723 05/20/12 2139 05/20/12 2354 05/21/12 0525 05/21/12 1123  TROPONINI <0.30 <0.30 <0.30 <0.30 <0.30   BNP (last 3 results)  Recent Labs  10/24/11 1621  PROBNP 685.8*   CBG:  Recent Labs Lab 05/20/12 2249 05/21/12 0844 05/21/12 1214  GLUCAP 197* 195* 217*    Recent Results (from the past 240 hour(s))  MRSA PCR SCREENING     Status: None   Collection Time    05/21/12 12:55 AM      Result Value Range Status   MRSA by PCR NEGATIVE  NEGATIVE Final   Comment:            The GeneXpert MRSA Assay (FDA     approved for NASAL specimens     only), is one component of a     comprehensive MRSA colonization     surveillance program. It is not     intended to diagnose MRSA     infection nor to guide or     monitor treatment for     MRSA infections.     Studies:  Recent x-ray studies have been reviewed in detail by the Attending Physician  Scheduled Meds:  Scheduled Meds: . aspirin EC  81 mg Oral Daily  . ceFEPime (MAXIPIME) IV  1 g Intravenous Q12H  . clopidogrel  75 mg Oral Q breakfast  . enoxaparin (LOVENOX) injection  40 mg Subcutaneous Q24H  . folic acid  1 mg Oral Daily  . insulin aspart  0-9 Units Subcutaneous TID WC  . insulin glargine  65 Units Subcutaneous QHS  . levothyroxine  56 mcg Oral QAC breakfast  . omega-3 acid ethyl esters  2 g Oral BID  . sodium chloride  3 mL Intravenous Q12H  . vancomycin  1,000 mg Intravenous Q24H   Continuous Infusions: . sodium chloride 125 mL/hr at 05/21/12 1610    Time spent on care of this  patient: 35 mins   [facility] T  [facility] Office  [phone] Pager - Text Page per Loretha Stapler as per below:  On-Call/Text Page:      [email or web address]      password [email or web address]  If 7PM-7AM, please contact night-coverage [email or web address] Password [email or web address] 05/21/2012, 3:36 PM   LOS: 1 day

## 2012-05-22 ENCOUNTER — Other Ambulatory Visit: Payer: Self-pay

## 2012-05-22 LAB — BASIC METABOLIC PANEL
BUN: 13 mg/dL (ref 6–23)
CO2: 25 mEq/L (ref 19–32)
Chloride: 106 mEq/L (ref 96–112)
Creatinine, Ser: 0.64 mg/dL (ref 0.50–1.10)
Glucose, Bld: 227 mg/dL — ABNORMAL HIGH (ref 70–99)

## 2012-05-22 LAB — CBC
HCT: 35.7 % — ABNORMAL LOW (ref 36.0–46.0)
MCHC: 33.3 g/dL (ref 30.0–36.0)
MCV: 88.4 fL (ref 78.0–100.0)
RDW: 13.8 % (ref 11.5–15.5)
WBC: 8.6 10*3/uL (ref 4.0–10.5)

## 2012-05-22 LAB — GLUCOSE, CAPILLARY: Glucose-Capillary: 226 mg/dL — ABNORMAL HIGH (ref 70–99)

## 2012-05-22 LAB — TROPONIN I: Troponin I: <0.3 ng/mL (ref <0.30)

## 2012-05-22 MED ORDER — INSULIN GLARGINE 100 UNIT/ML ~~LOC~~ SOLN
65.0000 [IU] | Freq: Every day | SUBCUTANEOUS | Status: DC
  Administered 2012-05-22: 65 [IU] via SUBCUTANEOUS
  Filled 2012-05-22 (×2): qty 0.65

## 2012-05-22 MED ORDER — CARVEDILOL 12.5 MG PO TABS
12.5000 mg | ORAL_TABLET | Freq: Two times a day (BID) | ORAL | Status: DC
  Administered 2012-05-22 – 2012-05-25 (×6): 12.5 mg via ORAL
  Filled 2012-05-22 (×7): qty 1

## 2012-05-22 MED ORDER — RAMIPRIL 5 MG PO CAPS
5.0000 mg | ORAL_CAPSULE | Freq: Every day | ORAL | Status: DC
  Administered 2012-05-22 – 2012-05-25 (×4): 5 mg via ORAL
  Filled 2012-05-22 (×4): qty 1

## 2012-05-22 NOTE — Progress Notes (Signed)
[facility] Progress Note [place] TEAM 1 - Stepdown/ICU TEAM   Cathy [name] EAV:[id number] DOB: 04/17/1930 DOA: 05/20/2012 PCP: Milinda Antis, MD  Brief narrative: 79 y.o. female who was admitted last month for acute ST elevation MI and had cardiac catheter w/ stent placed had gone for regular followup with her primary care physician. During said visit patient was found to be weak and hypotensive and was transferred to the ER for further workup. Patient stated over the last 2 days she had been feeling nauseated and had subjective feeling of fever and chills. Patient was feeling very weak and also had been having chest pain. Patient stated that her chest pain has been off and on since last month. Denied any associated shortness of breath productive cough or abdominal pain. In the ER patient was found to be hypotensive and labs showed leukocytosis and elevated lactic acid levels. UA was compatible with UTI. EKG was showing some lateral T wave inversions but cardiac enzymes were negative. Patient was given a total of 1.5 L normal saline bolus. Blood pressure improved.  Patient stated since her discharge last month patient had been taking ciprofloxacin for UTI. She had taken 2 courses of ciprofloxacin for a total of 15 days. Patient stated she has chronic diarrhea from previous colectomy for endometriosis. Had noticed no blood in the diarrhea.   Assessment/Plan:  Hypotension w/ lactic acidosis Likely due to dehydration (ongoing diuretic use despite poor intake in setting of  UTI) plus possible "early sepsis" - improving rapidly with volume expansion, and now resolved   Urinary tract infection Suspect drug-resistant gram-negative organism - culture not helpful in isolating specific oraganism - continue empiric antibiotic coverage as pt is clinically improving - plan to transition to ceftin when ready for oral med to complete a 10 day tx course   Coronary artery disease w/ anterior MI 04/15/12  s/p PTCA-LCx  S/p CABG 2003 - troponin negative x5 total - had episode of CP last night, but quickly resolved w/ nitro (was off imdur and BB at time due to hypotension) - repeat troponins remained negative and f/u EKGs were stable - sx have not recurred since resumption of imdur and BB - remains on aspirin and Plavix  Cardiomyopathy, ischemic Echo 04/19/12: EF 25-30%, mod LVH, diffuse HK, trivial AI - appears well compensated at present - follow very closely due to initial need for volume expansion - watch daily weight and Is & Os  AKI (acute kidney injury) GFR 55 at admit - renal function has now normalized  HTN Blood pressure rebounded with volume and holding of meds - have resumed home meds  HLD Reported intolerance to statins - diet controlled only  DM CBG is trending up - adjust treatment again today and continue to follow  Hypothyroidsim TSH is actually somewhat over suppressed - free T4 pending  Code Status: FULL Family Communication: Spoke with patient and niece at bedside Disposition Plan: Stable for transfer to telemetry bed  Consultants: none  Procedures: none  Antibiotics: Cefepime 5/1 >> 5/2 Vancomycin 5/1 Rocephin 5/2 >>  DVT prophylaxis: Lovenox  HPI/Subjective: The patient reports "I had a really bad night" due to difficulty sleeping and transient chest pain.  She reports feeling "weak everywhere."  She reports her chest pain resolved with use of nitroglycerin and has not returned.  She denies current shortness of breath nausea vomiting or abdominal pain.  Objective: Blood pressure 144/70, pulse 76, temperature 97.9 F (36.6 C), temperature source Oral, resp. rate 28,  height 5\' 5"  (1.651 m), weight 85.821 kg (189 lb 3.2 oz), SpO2 95.00%.  Intake/Output Summary (Last 24 hours) at 05/22/12 1143 Last data filed at 05/22/12 1016  Gross per 24 hour  Intake   2170 ml  Output   3900 ml  Net  -1730 ml   Exam: General: No acute respiratory distress Lungs:  Clear to auscultation bilaterally without wheezes or crackles Cardiovascular: Regular rate and rhythm without murmur gallop or rub Abdomen: Nontender, nondistended, soft, bowel sounds positive, no rebound, no ascites, no appreciable mass Extremities: No significant cyanosis, clubbing, or edema bilateral lower extremities  Data Reviewed: Basic Metabolic Panel:  Recent Labs Lab 05/20/12 1723 05/20/12 2355 05/21/12 0525 05/22/12 0620  NA 142  --  139 140  K 4.6  --  3.8 4.1  CL 103  --  107 106  CO2 27  --  23 25  GLUCOSE 107*  --  226* 227*  BUN 22  --  22 13  CREATININE 1.31* 1.07 0.79 0.64  CALCIUM 10.3  --  9.1 9.3   Liver Function Tests:  Recent Labs Lab 05/20/12 2138 05/21/12 0525  AST 12 10  ALT 6 6  ALKPHOS 59 62  BILITOT 0.3 0.3  PROT 6.0 5.6*  ALBUMIN 2.9* 2.5*    Recent Labs Lab 05/20/12 2138  LIPASE 27   CBC:  Recent Labs Lab 05/20/12 1723 05/20/12 2355 05/21/12 0525 05/22/12 0620  WBC 13.9* 10.8* 10.4 8.6  NEUTROABS 8.7*  --  4.7  --   HGB 14.7 12.1 12.0 11.9*  HCT 43.0 36.9 35.7* 35.7*  MCV 88.1 88.1 87.7 88.4  PLT 267 223 205 202   Cardiac Enzymes:  Recent Labs Lab 05/20/12 2354 05/21/12 0525 05/21/12 1123 05/21/12 1904 05/22/12 0620  TROPONINI <0.30 <0.30 <0.30 <0.30 <0.30   BNP (last 3 results)  Recent Labs  10/24/11 1621  PROBNP 685.8*   CBG:  Recent Labs Lab 05/20/12 2249 05/21/12 0844 05/21/12 1214 05/21/12 1621 05/22/12 0653  GLUCAP 197* 195* 217* 190* 226*    Recent Results (from the past 240 hour(s))  URINE CULTURE     Status: None   Collection Time    05/20/12  6:01 PM      Result Value Range Status   Specimen Description URINE, RANDOM   Final   Special Requests NONE   Final   Culture  Setup Time 05/20/2012 18:58   Final   Colony Count >=100,000 COLONIES/ML   Final   Culture     Final   Value: Multiple bacterial morphotypes present, none predominant. Suggest appropriate recollection if clinically  indicated.   Report Status 05/21/2012 FINAL   Final  MRSA PCR SCREENING     Status: None   Collection Time    05/21/12 12:55 AM      Result Value Range Status   MRSA by PCR NEGATIVE  NEGATIVE Final   Comment:            The GeneXpert MRSA Assay (FDA     approved for NASAL specimens     only), is one component of a     comprehensive MRSA colonization     surveillance program. It is not     intended to diagnose MRSA     infection nor to guide or     monitor treatment for     MRSA infections.     Studies:  Recent x-ray studies have been reviewed in detail by the Attending Physician  Scheduled  Meds:  Scheduled Meds: . aspirin EC  81 mg Oral Daily  . carvedilol  6.25 mg Oral BID WC  . cefTRIAXone (ROCEPHIN)  IV  1 g Intravenous Q24H  . clopidogrel  75 mg Oral Q breakfast  . enoxaparin (LOVENOX) injection  40 mg Subcutaneous Q24H  . folic acid  1 mg Oral Daily  . furosemide  40 mg Oral BID  . insulin aspart  0-9 Units Subcutaneous TID WC  . insulin glargine  55 Units Subcutaneous QHS  . isosorbide mononitrate  30 mg Oral Daily  . levothyroxine  56 mcg Oral QAC breakfast  . omega-3 acid ethyl esters  2 g Oral BID  . sodium chloride  3 mL Intravenous Q12H  . vancomycin  1,000 mg Intravenous Q24H      Time spent on care of this patient: 35 mins   [facility] T  [facility] Office  [phone] Pager - Text Page per Loretha Stapler as per below:  On-Call/Text Page:      [email or web address]      password [email or web address]  If 7PM-7AM, please contact night-coverage [email or web address] Password [email or web address] 05/22/2012, 11:43 AM   LOS: 2 days

## 2012-05-22 NOTE — Progress Notes (Signed)
Pt c/o of chest pain on arrival to the unit. Chest pain at 4/10 midsternal , BP at 135/85 and HR=  96. Placed on # LPM oxygen via Nasal Cannula, Ntg 1 tab SL. Ancil Linsey RN

## 2012-05-22 NOTE — Progress Notes (Signed)
MD called and updated. New order received cycle cardiac enzymes. Ancil Linsey  RN

## 2012-05-22 NOTE — Progress Notes (Signed)
Subjective/Objective Patient with episode of mid-sternal, non-radiating chest pain on transfer from 2600 to 3W. States she walked from her bed to the door and was placed in a wheelchair. She states that she has episodes of chest pain with activity since her stent placement. She received NTG x 2 earlier, placed on 2L [place] oxygen and states she has had no further pain. She denies dyspnea or nausea.  Scheduled Meds: . aspirin EC  81 mg Oral Daily  . carvedilol  12.5 mg Oral BID WC  . cefTRIAXone (ROCEPHIN)  IV  1 g Intravenous Q24H  . clopidogrel  75 mg Oral Q breakfast  . enoxaparin (LOVENOX) injection  40 mg Subcutaneous Q24H  . folic acid  1 mg Oral Daily  . furosemide  40 mg Oral BID  . insulin aspart  0-9 Units Subcutaneous TID WC  . insulin glargine  65 Units Subcutaneous QHS  . isosorbide mononitrate  30 mg Oral Daily  . levothyroxine  56 mcg Oral QAC breakfast  . omega-3 acid ethyl esters  2 g Oral BID  . ramipril  5 mg Oral Daily  . sodium chloride  3 mL Intravenous Q12H   Continuous Infusions:  PRN Meds:acetaminophen, acetaminophen, diphenhydrAMINE, fluticasone, HYDROcodone-acetaminophen, meclizine, nitroGLYCERIN, ondansetron (ZOFRAN) IV, ondansetron  Vital signs in last 24 hours: Temp:  [97.9 F (36.6 C)-98.2 F (36.8 C)] 98.1 F (36.7 C) (05/03 1631) Pulse Rate:  [76-99] 85 (05/03 1631) Resp:  [19-31] 19 (05/03 1631) BP: (127-155)/(57-77) 146/77 mmHg (05/03 1631) SpO2:  [91 %-98 %] 94 % (05/03 1631) Weight:  [85.821 kg (189 lb 3.2 oz)] 85.821 kg (189 lb 3.2 oz) (05/03 0500)  Intake/Output last 3 shifts: I/O last 3 completed shifts: In: 3440 [P.O.:1870; I.V.:1270; IV Piggyback:300] Out: 5600 [Urine:5600] Intake/Output this shift:   Physical Examination: General appearance - alert, oriented, no distress Chest - clear to auscultation, no wheezes, rales or rhonchi, symmetric air entry Heart - normal rate and regular rhythm  Problem Assessment/Plan  Chest Pain: likely  activity related possible due to demand ischemia. She has a history of anterior MI 04/15/12 s/p PTCA-LCx  and S/p CABG 2003. She had episode of CP last night (5/2), but quickly resolved w/ nitro. Troponins have been negative and EKGs stable. Will recycle troponins, continue 2L [place] oxygen and continue to monitor.

## 2012-05-22 NOTE — Progress Notes (Signed)
Nutrition Brief Note  Patient identified on the Malnutrition Screening Tool (MST) Report  Body mass index is 31.48 kg/(m^2). Patient meets criteria for obese class 2 based on current BMI.   Current diet order is CHO Mod Medium, patient is consuming approximately 100% of meals at this time. Labs and medications reviewed.   Pt denies wt change PTA.  States she is eating well with good appetite.  RD offered unit snack as pt reports hunger at time of visit, however pt refuses stating "I want to sleep, I'll wait until lunch."  RD introduced name and role and availability to meet with pt for other health-related concerns such as diabetes, however pt states "I want to sleep."  Lab Results  Component Value Date   HGBA1C 8.5* 04/15/2012    No nutrition interventions warranted at this time. If nutrition issues arise, please consult RD. Pt may benefit from outpatient education r/t to diabetes. Pt from home, recently d/c'd from rehab facility.    Loyce Dys, MS RD LDN Clinical Inpatient Dietitian Pager: [phone] Weekend/After hours pager: [phone]

## 2012-05-23 ENCOUNTER — Encounter: Payer: Self-pay | Admitting: Cardiology

## 2012-05-23 DIAGNOSIS — I2510 Atherosclerotic heart disease of native coronary artery without angina pectoris: Secondary | ICD-10-CM

## 2012-05-23 DIAGNOSIS — I739 Peripheral vascular disease, unspecified: Secondary | ICD-10-CM

## 2012-05-23 DIAGNOSIS — N189 Chronic kidney disease, unspecified: Secondary | ICD-10-CM | POA: Insufficient documentation

## 2012-05-23 DIAGNOSIS — N179 Acute kidney failure, unspecified: Secondary | ICD-10-CM

## 2012-05-23 DIAGNOSIS — I5022 Chronic systolic (congestive) heart failure: Secondary | ICD-10-CM | POA: Insufficient documentation

## 2012-05-23 LAB — GLUCOSE, CAPILLARY: Glucose-Capillary: 288 mg/dL — ABNORMAL HIGH (ref 70–99)

## 2012-05-23 LAB — BASIC METABOLIC PANEL
BUN: 16 mg/dL (ref 6–23)
Calcium: 8.9 mg/dL (ref 8.4–10.5)
Creatinine, Ser: 0.71 mg/dL (ref 0.50–1.10)
GFR calc Af Amer: >90 mL/min (ref >90)
GFR calc non Af Amer: 79 mL/min — ABNORMAL LOW (ref >90)
Glucose, Bld: 354 mg/dL — ABNORMAL HIGH (ref 70–99)
Potassium: 4 mEq/L (ref 3.5–5.1)

## 2012-05-23 LAB — CBC
MCH: 29.1 pg (ref 26.0–34.0)
MCHC: 33.3 g/dL (ref 30.0–36.0)
Platelets: 208 10*3/uL (ref 150–400)
RDW: 13.6 % (ref 11.5–15.5)

## 2012-05-23 LAB — TROPONIN I
Troponin I: <0.3 ng/mL (ref <0.30)
Troponin I: <0.3 ng/mL (ref <0.30)

## 2012-05-23 MED ORDER — MORPHINE SULFATE 2 MG/ML IJ SOLN
1.0000 mg | INTRAMUSCULAR | Status: DC | PRN
  Administered 2012-05-23: 1 mg via INTRAVENOUS
  Filled 2012-05-23: qty 1

## 2012-05-23 MED ORDER — INSULIN GLARGINE 100 UNIT/ML ~~LOC~~ SOLN
70.0000 [IU] | Freq: Every day | SUBCUTANEOUS | Status: DC
  Administered 2012-05-23 – 2012-05-24 (×2): 70 [IU] via SUBCUTANEOUS
  Filled 2012-05-23 (×2): qty 0.7

## 2012-05-23 NOTE — Progress Notes (Signed)
1900 delayed entry : Pt currently chest pain free after pt was given a combination nitroglycerin, morphine . EKG done earlier. Pt observed eating her food and states " I am a lot beter now, i don't have chest pain nor arm and back  Pain.  Ancil Linsey Rn

## 2012-05-23 NOTE — Progress Notes (Addendum)
Physical Therapy Evaluation Patient Details Name: Cathy [name] MRN: [id number] DOB: [birth year] Today's Date: 05/23/2012 Time: 4098-1191 PT Time Calculation (min): 32 min  PT Assessment / Plan / Recommendation Clinical Impression  79 yo female admitted with chest pain presents with decr functional capacity, limited by cardiopulmonary status; Will benefit from PT to closely monitor activity tolerance, and assist with facilitating dc planning; Cathy [name] presents a quandry as she has quite limited activity tolerance, which is quite concerning for dc home as she is alone for much of the day; Still, she clearly states she wants to go home, having been to rehab at SNF (where she reports she did well) and needing to sign herself out based on finanacial reasons; It is unclear if she can get more assistance    PT Assessment  Patient needs continued PT services    Follow Up Recommendations  Home health PT;Supervision - Intermittent (HHOT and HHAide as well; Meals on Wheels)    Does the patient have the potential to tolerate intense rehabilitation      Barriers to Discharge Decreased caregiver support      Equipment Recommendations  Rolling walker with 5" wheels    Recommendations for Other Services OT consult   Frequency Min 3X/week    Precautions / Restrictions Precautions Precautions: Fall Precaution Comments: Monitor her for Chest Pain during activity; the goal is for her to be able to self-monitor and stop activity BEFORE the onset of chest pain   Pertinent Vitals/Pain Chest pain with acitivity, so assisted pt back to bed Vitals: O2 sats 97% on Room Air, HR 85 BP 142/77      Mobility  Bed Mobility Bed Mobility: Supine to Sit;Sit to Supine Supine to Sit: 4: Min guard (without physical contact) Sit to Supine: 5: Supervision Details for Bed Mobility Assistance: Overall smooth transition wihtout need for phsyical assist Transfers Transfers: Sit to Stand;Stand to Sit Sit to Stand:  4: Min guard;From bed;From chair/3-in-1 Stand to Sit: 4: Min guard;To chair/3-in-1;To bed Details for Transfer Assistance: Noted dependes on UEs to push Ambulation/Gait Ambulation/Gait Assistance: 4: Min guard (with and without physical contact) Ambulation Distance (Feet): 100 Feet Assistive device: Rolling walker Ambulation/Gait Assistance Details: Cues to self-monitor for activity tolerance; SLow gait with RW, but  no loss of balance noted; midway through walk, noted pt with increasing shortness of breath, and she reported Chest pain similar to what she experienced last night; Assisted her back to bed, obtained vitals, and notified RN Gait Pattern: Decreased stride length    Exercises     PT Diagnosis: Difficulty walking;Generalized weakness;Acute pain (Anginal pain)  PT Problem List: Decreased strength;Decreased activity tolerance;Decreased balance;Decreased mobility;Decreased knowledge of use of DME;Decreased safety awareness;Decreased knowledge of precautions;Pain;Cardiopulmonary status limiting activity PT Treatment Interventions: DME instruction;Gait training;Stair training;Functional mobility training;Therapeutic activities;Therapeutic exercise;Balance training;Patient/family education   PT Goals Acute Rehab PT Goals PT Goal Formulation: With patient Time For Goal Achievement: 06/06/12 Potential to Achieve Goals: Good Pt will go Supine/Side to Sit: with modified independence PT Goal: Supine/Side to Sit - Progress: Goal set today Pt will go Sit to Supine/Side: with modified independence PT Goal: Sit to Supine/Side - Progress: Goal set today Pt will go Sit to Stand: with modified independence PT Goal: Sit to Stand - Progress: Goal set today Pt will go Stand to Sit: with modified independence PT Goal: Stand to Sit - Progress: Goal set today Pt will Ambulate: 51 - 150 feet;with modified independence;with rolling walker PT Goal: Ambulate - Progress: Goal set  today Additional  Goals Additional Goal #1: Pt will appropriately self-monitor for activity tolerance and stop activity before the onset of anginal pain PT Goal: Additional Goal #1 - Progress: Goal set today  Visit Information  Last PT Received On: 05/23/12 Assistance Needed: +1    Subjective Data  Subjective: REALLY wants to go home; has been to SNF for rehab, states she did well there, but when she found out her copay, she decided to go home; She firmly states she won't be going back to SNF Patient Stated Goal: Home   Prior Functioning  Home Living Lives With: Other (Comment) (nephew) Available Help at Discharge: Available PRN/intermittently;Friend(s) (neighbors check in daily; Aurther Loft is gone a lot (Naval architect) Type of Home: House Home Access: Level entry (needs to be verified) Home Layout: One level Bathroom Shower/Tub: Engineer, manufacturing systems: Handicapped height Bathroom Accessibility: Yes Home Adaptive Equipment: Bedside commode/3-in-1;Straight cane Prior Function Level of Independence: Independent with assistive device(s) Driving: No Comments: Mostly microwaves meals; Neice helps with grocery shopping Communication Communication: No difficulties    Cognition  Cognition Arousal/Alertness: Awake/alert Behavior During Therapy: WFL for tasks assessed/performed Overall Cognitive Status: Within Functional Limits for tasks assessed (Still, concerns about pt's awareness of her cardiopulmonary status)    Extremity/Trunk Assessment Right Upper Extremity Assessment RUE ROM/Strength/Tone: WFL for tasks assessed Left Upper Extremity Assessment LUE ROM/Strength/Tone: WFL for tasks assessed Right Lower Extremity Assessment RLE ROM/Strength/Tone: Deficits RLE ROM/Strength/Tone Deficits: Generally weak; noted dependence on UE push for sit to stand Left Lower Extremity Assessment LLE ROM/Strength/Tone: Deficits LLE ROM/Strength/Tone Deficits: Same as R   Balance    End of Session PT - End  of Session Activity Tolerance: Other (comment) (limited by angina) Patient left: in bed;with call bell/phone within reach Nurse Communication: Mobility status;Other (comment) (another episode of chest pain with activity)  GP     Van Clines [facility] [place], [place] [phone]  05/23/2012, 5:36 PM

## 2012-05-23 NOTE — Progress Notes (Signed)
Pt had c/o chest pain 4/10 on pain scale, radiating on the back and  Arms and shoulders. 1 NTG SL given . BP 138/60, HR = 95 >me. Will update Dr. Benjamine Mola. EKG done Left and Right sided EKG. Will label accordingly. Ancil Linsey RN

## 2012-05-23 NOTE — Progress Notes (Addendum)
[facility] PROGRESS NOTE  Cathy [name] JYN:[id number] DOB: Nov 30, 1930 DOA: 05/20/2012 PCP: Milinda Antis, MD  Brief narrative:  79 y.o. female who was admitted last month for acute ST elevation MI and had cardiac catheter w/ stent placed had gone for regular followup with her primary care physician. During said visit patient was found to be weak and hypotensive and was transferred to the ER for further workup. Patient stated over the last 2 days she had been feeling nauseated and had subjective feeling of fever and chills. Patient was feeling very weak and also had been having chest pain. Patient stated that her chest pain has been off and on since last month. Denied any associated shortness of breath productive cough or abdominal pain. In the ER patient was found to be hypotensive and labs showed leukocytosis and elevated lactic acid levels. UA was compatible with UTI. EKG was showing some lateral T wave inversions but cardiac enzymes were negative. Patient was given a total of 1.5 L normal saline bolus. Blood pressure improved. Patient stated since her discharge last month patient had been taking ciprofloxacin for UTI. She had taken 2 courses of ciprofloxacin for a total of 15 days. Patient stated she has chronic diarrhea from previous colectomy for endometriosis. Had noticed no blood in the diarrhea.   Assessment/Plan:  Hypotension w/ lactic acidosis  Likely due to dehydration (ongoing diuretic use despite poor intake in setting of UTI) plus possible "early sepsis" - improving rapidly with volume expansion, and now resolved   Urinary tract infection  Suspect drug-resistant gram-negative organism - culture not helpful in isolating specific oraganism - continue empiric antibiotic coverage as pt is clinically improving and WBC decreasing - plan to transition to ceftin when ready for oral med to complete a 10 day tx course   Coronary artery disease w/ anterior MI 04/15/12 s/p PTCA-LCx  S/p  CABG 2003 - troponin negative x5 total - had episode of CP last night, but quickly resolved w/ nitro (was off imdur and BB at time due to hypotension) - repeat troponins remained negative and f/u EKGs were stable -  resumption of imdur and BB - remains on aspirin and Plavix   Cardiomyopathy, ischemic  Echo 04/19/12: EF 25-30%, mod LVH, diffuse HK, trivial AI - appears well compensated at present - follow very closely due to initial need for volume expansion - watch daily weight and Is & Os   Severe asymptomatic right internal carotid artery stenosis  - will need to follow up with Dr. Hassell Halim for timing of this surgery   AKI (acute kidney injury)  GFR 55 at admit - renal function has now normalized   HTN  Blood pressure rebounded with volume and holding of meds - have resumed home meds   HLD  Reported intolerance to statins - diet controlled only   DM  CBG is trending up - increase lantus   Hypothyroidsim  TSH is actually somewhat over suppressed - free T4 ok   Code Status: full Family Communication:  Disposition Plan: PT/OT eval- patient lives at home with nephew who is a truck driver and leaves quite often- has been to rehab and check her-self out due to costs   Consultants:  none  Procedures:  none  Antibiotics:  rocephin  HPI/Subjective: Transfer out ICU c/o pain with urination  Objective: Filed Vitals:   05/22/12 1248 05/22/12 1631 05/22/12 2130 05/23/12 0533  BP: 155/77 146/77 116/50 154/65  Pulse: 91 85 71 74  Temp: 98 F (  36.7 C) 98.1 F (36.7 C) 97.9 F (36.6 C) 98.5 F (36.9 C)  TempSrc: Axillary Oral Oral Oral  Resp: 23 19 20 18   Height:      Weight:    84.823 kg (187 lb)  SpO2: 97% 94% 100% 100%    Intake/Output Summary (Last 24 hours) at 05/23/12 0753 Last data filed at 05/22/12 1844  Gross per 24 hour  Intake    800 ml  Output   1800 ml  Net  -1000 ml   Filed Weights   05/21/12 0500 05/22/12 0500 05/23/12 0533  Weight: 87 kg (191  lb 12.8 oz) 85.821 kg (189 lb 3.2 oz) 84.823 kg (187 lb)    Exam:   General:  A+Ox3, NAD  Cardiovascular: rrr  Respiratory: clear anterior  Abdomen: +BS, soft, NT  Musculoskeletal: moves all 4 ext   Data Reviewed: Basic Metabolic Panel:  Recent Labs Lab 05/20/12 1723 05/20/12 2355 05/21/12 0525 05/22/12 0620 05/23/12 0046  NA 142  --  139 140 135  K 4.6  --  3.8 4.1 4.0  CL 103  --  107 106 100  CO2 27  --  23 25 23   GLUCOSE 107*  --  226* 227* 354*  BUN 22  --  22 13 16   CREATININE 1.31* 1.07 0.79 0.64 0.71  CALCIUM 10.3  --  9.1 9.3 8.9   Liver Function Tests:  Recent Labs Lab 05/20/12 2138 05/21/12 0525  AST 12 10  ALT 6 6  ALKPHOS 59 62  BILITOT 0.3 0.3  PROT 6.0 5.6*  ALBUMIN 2.9* 2.5*    Recent Labs Lab 05/20/12 2138  LIPASE 27   No results found for this basename: AMMONIA,  in the last 168 hours CBC:  Recent Labs Lab 05/20/12 1723 05/20/12 2355 05/21/12 0525 05/22/12 0620 05/23/12 0046  WBC 13.9* 10.8* 10.4 8.6 10.4  NEUTROABS 8.7*  --  4.7  --   --   HGB 14.7 12.1 12.0 11.9* 12.3  HCT 43.0 36.9 35.7* 35.7* 36.9  MCV 88.1 88.1 87.7 88.4 87.2  PLT 267 223 205 202 208   Cardiac Enzymes:  Recent Labs Lab 05/21/12 1123 05/21/12 1904 05/22/12 0620 05/22/12 1905 05/23/12 0046  TROPONINI <0.30 <0.30 <0.30 <0.30 <0.30   BNP (last 3 results)  Recent Labs  10/24/11 1621  PROBNP 685.8*   CBG:  Recent Labs Lab 05/21/12 1621 05/22/12 0653 05/22/12 1638 05/22/12 2107 05/23/12 0733  GLUCAP 190* 226* 257* 266* 259*    Recent Results (from the past 240 hour(s))  URINE CULTURE     Status: None   Collection Time    05/20/12  6:01 PM      Result Value Range Status   Specimen Description URINE, RANDOM   Final   Special Requests NONE   Final   Culture  Setup Time 05/20/2012 18:58   Final   Colony Count >=100,000 COLONIES/ML   Final   Culture     Final   Value: Multiple bacterial morphotypes present, none predominant.  Suggest appropriate recollection if clinically indicated.   Report Status 05/21/2012 FINAL   Final  CULTURE, BLOOD (ROUTINE X 2)     Status: None   Collection Time    05/20/12  6:58 PM      Result Value Range Status   Specimen Description BLOOD ARM RIGHT   Final   Special Requests BOTTLES DRAWN AEROBIC AND ANAEROBIC 10CC   Final   Culture  Setup Time 05/21/2012 02:23  Final   Culture     Final   Value:        BLOOD CULTURE RECEIVED NO GROWTH TO DATE CULTURE WILL BE HELD FOR 5 DAYS BEFORE ISSUING A FINAL NEGATIVE REPORT   Report Status PENDING   Incomplete  CULTURE, BLOOD (ROUTINE X 2)     Status: None   Collection Time    05/20/12  7:07 PM      Result Value Range Status   Specimen Description BLOOD HAND RIGHT   Final   Special Requests BOTTLES DRAWN AEROBIC ONLY 1CC   Final   Culture  Setup Time 05/21/2012 02:22   Final   Culture     Final   Value:        BLOOD CULTURE RECEIVED NO GROWTH TO DATE CULTURE WILL BE HELD FOR 5 DAYS BEFORE ISSUING A FINAL NEGATIVE REPORT   Report Status PENDING   Incomplete  MRSA PCR SCREENING     Status: None   Collection Time    05/21/12 12:55 AM      Result Value Range Status   MRSA by PCR NEGATIVE  NEGATIVE Final   Comment:            The GeneXpert MRSA Assay (FDA     approved for NASAL specimens     only), is one component of a     comprehensive MRSA colonization     surveillance program. It is not     intended to diagnose MRSA     infection nor to guide or     monitor treatment for     MRSA infections.     Studies: No results found.  Scheduled Meds: . aspirin EC  81 mg Oral Daily  . carvedilol  12.5 mg Oral BID WC  . cefTRIAXone (ROCEPHIN)  IV  1 g Intravenous Q24H  . clopidogrel  75 mg Oral Q breakfast  . enoxaparin (LOVENOX) injection  40 mg Subcutaneous Q24H  . folic acid  1 mg Oral Daily  . furosemide  40 mg Oral BID  . insulin aspart  0-9 Units Subcutaneous TID WC  . insulin glargine  65 Units Subcutaneous QHS  . isosorbide  mononitrate  30 mg Oral Daily  . levothyroxine  56 mcg Oral QAC breakfast  . omega-3 acid ethyl esters  2 g Oral BID  . ramipril  5 mg Oral Daily  . sodium chloride  3 mL Intravenous Q12H   Continuous Infusions:   Principal Problem:   Sepsis Active Problems:   Coronary artery disease   Chest pain   Cardiomyopathy, ischemic   AKI (acute kidney injury)   Urinary tract infection    Time spent: 35    [facility], Jeanet Lupe  [facility] Pager [phone]. If 7PM-7AM, please contact night-coverage at [email or web address], password [facility] 05/23/2012, 7:53 AM  LOS: 3 days

## 2012-05-24 ENCOUNTER — Ambulatory Visit: Payer: Medicare Other | Admitting: Cardiology

## 2012-05-24 DIAGNOSIS — M549 Dorsalgia, unspecified: Secondary | ICD-10-CM

## 2012-05-24 LAB — BASIC METABOLIC PANEL
CO2: 27 mEq/L (ref 19–32)
Calcium: 9.3 mg/dL (ref 8.4–10.5)
Chloride: 100 mEq/L (ref 96–112)
Creatinine, Ser: 0.64 mg/dL (ref 0.50–1.10)
Glucose, Bld: 289 mg/dL — ABNORMAL HIGH (ref 70–99)

## 2012-05-24 LAB — CBC
Hemoglobin: 13.3 g/dL (ref 12.0–15.0)
MCH: 29.3 pg (ref 26.0–34.0)
MCV: 88.5 fL (ref 78.0–100.0)
Platelets: 232 10*3/uL (ref 150–400)
RBC: 4.54 MIL/uL (ref 3.87–5.11)
WBC: 10.5 10*3/uL (ref 4.0–10.5)

## 2012-05-24 LAB — GLUCOSE, CAPILLARY
Glucose-Capillary: 245 mg/dL — ABNORMAL HIGH (ref 70–99)
Glucose-Capillary: 306 mg/dL — ABNORMAL HIGH (ref 70–99)
Glucose-Capillary: 323 mg/dL — ABNORMAL HIGH (ref 70–99)
Glucose-Capillary: 279 mg/dL — ABNORMAL HIGH (ref 70–99)

## 2012-05-24 NOTE — Progress Notes (Signed)
Inpatient Diabetes Program Recommendations  AACE/ADA: New Consensus Statement on Inpatient Glycemic Control (2013)  Target Ranges:  Prepandial:   less than 140 mg/dL      Peak postprandial:   less than 180 mg/dL (1-2 hours)      Critically ill patients:  140 - 180 mg/dL    Results for VYLETTE, STRUBEL (MRN [id number]) as of 05/24/2012 13:00  Ref. Range 05/23/2012 07:33 05/23/2012 11:50 05/23/2012 16:17 05/23/2012 21:16  Glucose-Capillary Latest Range: 70-99 mg/dL 962 (H) 952 (H) 841 (H) 288 (H)    Results for MARIJKE, GUADIANA (MRN [id number]) as of 05/24/2012 13:00  Ref. Range 05/24/2012 07:20 05/24/2012 11:31  Glucose-Capillary Latest Range: 70-99 mg/dL 253 (H) 664 (H)    Patient having significant glucose elevations.  Eating 90-100% of meals.  Inpatient Diabetes Program Recommendations Insulin - Basal: Please consider increasing Lantus insulin to 80 units QHS. Insulin - Meal Coverage: Please consider adding Novolog meal coverage- Novolog 4 units tid with meals.  Will follow. Ambrose Finland RN, MSN, CDE Diabetes Coordinator Inpatient Diabetes Program [phone]

## 2012-05-24 NOTE — Progress Notes (Addendum)
[facility] PROGRESS NOTE  Cathy [name] ZOX:[id number] DOB: [birth year] DOA: 05/20/2012 PCP: Milinda Antis, MD  Brief narrative:  79 y.o. female who was admitted last month for acute ST elevation MI and had cardiac catheter w/ stent placed had gone for regular followup with her primary care physician. During said visit patient was found to be weak and hypotensive and was transferred to the ER for further workup. Patient stated over the last 2 days she had been feeling nauseated and had subjective feeling of fever and chills. Patient was feeling very weak and also had been having chest pain. Patient stated that her chest pain has been off and on since last month. Denied any associated shortness of breath productive cough or abdominal pain. In the ER patient was found to be hypotensive and labs showed leukocytosis and elevated lactic acid levels. UA was compatible with UTI. EKG was showing some lateral T wave inversions but cardiac enzymes were negative. Patient was given a total of 1.5 L normal saline bolus. Blood pressure improved. Patient stated since her discharge last month patient had been taking ciprofloxacin for UTI. She had taken 2 courses of ciprofloxacin for a total of 15 days. Patient stated she has chronic diarrhea from previous colectomy for endometriosis. Had noticed no blood in the diarrhea.   Assessment/Plan:  Hypotension w/ lactic acidosis  Likely due to dehydration (ongoing diuretic use despite poor intake in setting of UTI) plus possible "early sepsis" - improving rapidly with volume expansion, and now resolved   Urinary tract infection  Suspect drug-resistant gram-negative organism - culture not helpful in isolating specific oraganism - continue empiric antibiotic coverage as pt is clinically improving and WBC decreasing - plan to transition to ceftin when ready for oral med to complete a 10 day tx course   Coronary artery disease w/ anterior MI 04/15/12 s/p PTCA-LCx  S/p  CABG 2003 - troponin negative x5 total - had episode of CP last night, but quickly resolved w/ nitro (was off imdur and BB at time due to hypotension) - repeat troponins remained negative and f/u EKGs were stable -  resumption of imdur and BB - remains on aspirin and Plavix   Cardiomyopathy, ischemic  Echo 04/19/12: EF 25-30%, mod LVH, diffuse HK, trivial AI - appears well compensated at present - follow very closely due to initial need for volume expansion - watch daily weight and Is & Os  -recheck echo  Severe asymptomatic right internal carotid artery stenosis  - will need to follow up with Dr. Hassell Halim for timing of this surgery   AKI (acute kidney injury)  GFR 55 at admit - renal function has now normalized   HTN  Blood pressure rebounded with volume and holding of meds - have resumed home meds   HLD  Reported intolerance to statins - diet controlled only   DM  CBG is trending up - increase lantus   Hypothyroidsim  TSH is actually somewhat over suppressed - free T4 ok   Code Status: full Family Communication:  Disposition Plan: PT/OT eval- home health?   Consultants:  none  Procedures:  none  Antibiotics:  rocephin  HPI/Subjective: Had episode of CP last night but none since C/o being tired -not interested in SNF   Objective: Filed Vitals:   05/23/12 0921 05/23/12 1659 05/23/12 2122 05/24/12 0601  BP: 152/60 138/60 134/57 128/65  Pulse:  95 73 77  Temp:   98 F (36.7 C) 98.4 F (36.9 C)  TempSrc:  Oral Oral  Resp:   18 18  Height:      Weight:    83.598 kg (184 lb 4.8 oz)  SpO2:   98% 96%    Intake/Output Summary (Last 24 hours) at 05/24/12 0928 Last data filed at 05/24/12 0839  Gross per 24 hour  Intake   1720 ml  Output   1954 ml  Net   -234 ml   Filed Weights   05/22/12 0500 05/23/12 0533 05/24/12 0601  Weight: 85.821 kg (189 lb 3.2 oz) 84.823 kg (187 lb) 83.598 kg (184 lb 4.8 oz)    Exam:   General:  A+Ox3,  NAD  Cardiovascular: rrr  Respiratory: clear anterior  Abdomen: +BS, soft, NT  Musculoskeletal: moves all 4 ext   Data Reviewed: Basic Metabolic Panel:  Recent Labs Lab 05/20/12 1723 05/20/12 2355 05/21/12 0525 05/22/12 0620 05/23/12 0046 05/24/12 0550  NA 142  --  139 140 135 139  K 4.6  --  3.8 4.1 4.0 4.2  CL 103  --  107 106 100 100  CO2 27  --  23 25 23 27   GLUCOSE 107*  --  226* 227* 354* 289*  BUN 22  --  22 13 16 13   CREATININE 1.31* 1.07 0.79 0.64 0.71 0.64  CALCIUM 10.3  --  9.1 9.3 8.9 9.3   Liver Function Tests:  Recent Labs Lab 05/20/12 2138 05/21/12 0525  AST 12 10  ALT 6 6  ALKPHOS 59 62  BILITOT 0.3 0.3  PROT 6.0 5.6*  ALBUMIN 2.9* 2.5*    Recent Labs Lab 05/20/12 2138  LIPASE 27   No results found for this basename: AMMONIA,  in the last 168 hours CBC:  Recent Labs Lab 05/20/12 1723 05/20/12 2355 05/21/12 0525 05/22/12 0620 05/23/12 0046 05/24/12 0550  WBC 13.9* 10.8* 10.4 8.6 10.4 10.5  NEUTROABS 8.7*  --  4.7  --   --   --   HGB 14.7 12.1 12.0 11.9* 12.3 13.3  HCT 43.0 36.9 35.7* 35.7* 36.9 40.2  MCV 88.1 88.1 87.7 88.4 87.2 88.5  PLT 267 223 205 202 208 232   Cardiac Enzymes:  Recent Labs Lab 05/22/12 0620 05/22/12 1905 05/23/12 0046 05/23/12 0710 05/23/12 1811  TROPONINI <0.30 <0.30 <0.30 <0.30 <0.30   BNP (last 3 results)  Recent Labs  10/24/11 1621  PROBNP 685.8*   CBG:  Recent Labs Lab 05/23/12 0733 05/23/12 1150 05/23/12 1617 05/23/12 2116 05/24/12 0720  GLUCAP 259* 273* 309* 288* 287*    Recent Results (from the past 240 hour(s))  URINE CULTURE     Status: None   Collection Time    05/20/12  6:01 PM      Result Value Range Status   Specimen Description URINE, RANDOM   Final   Special Requests NONE   Final   Culture  Setup Time 05/20/2012 18:58   Final   Colony Count >=100,000 COLONIES/ML   Final   Culture     Final   Value: Multiple bacterial morphotypes present, none predominant.  Suggest appropriate recollection if clinically indicated.   Report Status 05/21/2012 FINAL   Final  CULTURE, BLOOD (ROUTINE X 2)     Status: None   Collection Time    05/20/12  6:58 PM      Result Value Range Status   Specimen Description BLOOD ARM RIGHT   Final   Special Requests BOTTLES DRAWN AEROBIC AND ANAEROBIC 10CC   Final   Culture  Setup  Time 05/21/2012 02:23   Final   Culture     Final   Value:        BLOOD CULTURE RECEIVED NO GROWTH TO DATE CULTURE WILL BE HELD FOR 5 DAYS BEFORE ISSUING A FINAL NEGATIVE REPORT   Report Status PENDING   Incomplete  CULTURE, BLOOD (ROUTINE X 2)     Status: None   Collection Time    05/20/12  7:07 PM      Result Value Range Status   Specimen Description BLOOD HAND RIGHT   Final   Special Requests BOTTLES DRAWN AEROBIC ONLY 1CC   Final   Culture  Setup Time 05/21/2012 02:22   Final   Culture     Final   Value:        BLOOD CULTURE RECEIVED NO GROWTH TO DATE CULTURE WILL BE HELD FOR 5 DAYS BEFORE ISSUING A FINAL NEGATIVE REPORT   Report Status PENDING   Incomplete  MRSA PCR SCREENING     Status: None   Collection Time    05/21/12 12:55 AM      Result Value Range Status   MRSA by PCR NEGATIVE  NEGATIVE Final   Comment:            The GeneXpert MRSA Assay (FDA     approved for NASAL specimens     only), is one component of a     comprehensive MRSA colonization     surveillance program. It is not     intended to diagnose MRSA     infection nor to guide or     monitor treatment for     MRSA infections.     Studies: No results found.  Scheduled Meds: . aspirin EC  81 mg Oral Daily  . carvedilol  12.5 mg Oral BID WC  . cefTRIAXone (ROCEPHIN)  IV  1 g Intravenous Q24H  . clopidogrel  75 mg Oral Q breakfast  . enoxaparin (LOVENOX) injection  40 mg Subcutaneous Q24H  . folic acid  1 mg Oral Daily  . furosemide  40 mg Oral BID  . insulin aspart  0-9 Units Subcutaneous TID WC  . insulin glargine  70 Units Subcutaneous QHS  . isosorbide  mononitrate  30 mg Oral Daily  . levothyroxine  56 mcg Oral QAC breakfast  . omega-3 acid ethyl esters  2 g Oral BID  . ramipril  5 mg Oral Daily  . sodium chloride  3 mL Intravenous Q12H   Continuous Infusions:   Active Problems:   Hypertension   Type II or unspecified type diabetes mellitus without mention of complication, not stated as uncontrolled   Cardiomyopathy, ischemic    Time spent: 35    [facility], Lamin Chandley  [facility] Pager [phone]. If 7PM-7AM, please contact night-coverage at [email or web address], password [facility] 05/24/2012, 9:28 AM  LOS: 4 days

## 2012-05-24 NOTE — Care Management Note (Signed)
    Page 1 of 2   05/24/2012     12:34:47 PM   CARE MANAGEMENT NOTE 05/24/2012  Patient:  Cathy [name], Cathy [name]   Account Number:  [id number]  Date Initiated:  05/24/2012  Documentation initiated by:  GRAVES-BIGELOW,Willella Harding  Subjective/Objective Assessment:   Pt admitted with dizziness and cp. Pt is from home with nephew and she has neighbors that look in on her.     Action/Plan:   Pt is currently active with [facility] for services. Resumption orders written and referral has been made with [facility]. SOC to begin within 24-48 hours post d/c.   Anticipated DC Date:  05/25/2012   Anticipated DC Plan:  HOME W HOME HEALTH SERVICES      DC Planning Services  CM consult      [facility] Choice  HOME HEALTH  Resumption Of Svcs/PTA Provider   Choice offered to / List presented to:  C-1 Patient        HH arranged  HH-2 PT  HH-10 DISEASE MANAGEMENT  HH-1 RN  HH-3 OT  HH-4 NURSE'S AIDE      HH agency  [facility]   Status of service:  Completed, signed off Medicare Important Message given?   (If response is "NO", the following Medicare IM given date fields will be blank) Date Medicare IM given:   Date Additional Medicare IM given:    Discharge Disposition:  HOME W HOME HEALTH SERVICES  Per UR Regulation:  Reviewed for med. necessity/level of care/duration of stay  If discussed at Long Length of Stay Meetings, dates discussed:    Comments:

## 2012-05-24 NOTE — Progress Notes (Signed)
Physical Therapy Treatment Patient Details Name: Cathy [name] MRN: [id number] DOB: [birth year] Today's Date: 05/24/2012 Time:  -     PT Assessment / Plan / Recommendation Comments on Treatment Session  Pt tolerated PT session with no reports of chest pain.      Follow Up Recommendations  Home health PT;Supervision - Intermittent (HHOT and HHAide as well, Meals on Wheels)     Does the patient have the potential to tolerate intense rehabilitation     Barriers to Discharge        Equipment Recommendations  Rolling walker with 5" wheels    Recommendations for Other Services    Frequency Min 3X/week   Plan Discharge plan remains appropriate    Precautions / Restrictions Precautions Precautions: Fall Precaution Comments: Monitor her for Chest Pain during activity; the goal is for her to be able to self-monitor and stop activity BEFORE the onset of chest pain       Mobility  Bed Mobility Bed Mobility: Supine to Sit;Sitting - Scoot to Edge of Bed Supine to Sit: 5: Supervision;With rails Sitting - Scoot to Edge of Bed: 5: Supervision Transfers Transfers: Sit to Stand;Stand to Sit Sit to Stand: 5: Supervision;With upper extremity assist;From bed;From toilet Stand to Sit: 5: Supervision;With upper extremity assist;With armrests;To chair/3-in-1;To toilet Details for Transfer Assistance: Cues for hand placement & technique.   Ambulation/Gait Ambulation/Gait Assistance: 4: Min guard Ambulation Distance (Feet): 115 Feet Assistive device: Rolling walker Ambulation/Gait Assistance Details: Pt denies chest pain; "I'll tell you if it starts hurting".  Cues for body positioning inside RW, tall posture.   Gait Pattern: Step-through pattern;Decreased stride length (decreased step height) Gait velocity: decreased Stairs: No Wheelchair Mobility Wheelchair Mobility: No     PT Goals Acute Rehab PT Goals Time For Goal Achievement: 06/06/12 Potential to Achieve Goals: Good Pt will go  Supine/Side to Sit: with modified independence PT Goal: Supine/Side to Sit - Progress: Progressing toward goal Pt will go Sit to Supine/Side: with modified independence Pt will go Sit to Stand: with modified independence PT Goal: Sit to Stand - Progress: Progressing toward goal Pt will go Stand to Sit: with modified independence PT Goal: Stand to Sit - Progress: Progressing toward goal Pt will Ambulate: 51 - 150 feet;with modified independence;with rolling walker PT Goal: Ambulate - Progress: Progressing toward goal Additional Goals Additional Goal #1: Pt will appropriately self-monitor for activity tolerance and stop activity before the onset of anginal pain PT Goal: Additional Goal #1 - Progress: Progressing toward goal  Visit Information  Last PT Received On: 05/24/12    Subjective Data  Subjective: "They're trying to starve me up here" Patient Stated Goal: Home   Cognition  Cognition Arousal/Alertness: Awake/alert Behavior During Therapy: WFL for tasks assessed/performed Overall Cognitive Status: Within Functional Limits for tasks assessed    Balance     End of Session PT - End of Session Equipment Utilized During Treatment: Gait belt Activity Tolerance: Patient tolerated treatment well Patient left: in chair;with call bell/phone within reach Nurse Communication: Mobility status     Verdell Face, [place] [phone] 05/24/2012

## 2012-05-24 NOTE — Evaluation (Signed)
Occupational Therapy Evaluation Patient Details Name: Cathy [name] MRN: [id number] DOB: 1930/02/07 Today's Date: 05/24/2012 Time: 1548-1600 OT Time Calculation (min): 12 min  OT Assessment / Plan / Recommendation Clinical Impression  79 yo female admitted with CP and limited by cardiopulmonary status. Ot to follow acutely. Recommend HHOT for d/c planning    OT Assessment  Patient needs continued OT Services    Follow Up Recommendations  Home health OT    Barriers to Discharge      Equipment Recommendations  None recommended by OT    Recommendations for Other Services    Frequency  Min 2X/week    Precautions / Restrictions Precautions Precautions: Fall Precaution Comments: Monitor her for Chest Pain during activity; the goal is for her to be able to self-monitor and stop activity BEFORE the onset of chest pain   Pertinent Vitals/Pain No pain reported    ADL  Grooming: Wash/dry hands;Modified independent Where Assessed - Grooming: Unsupported standing Toilet Transfer: Radiographer, therapeutic Method: Sit to Barista: Regular height toilet;Grab bars Toileting - Clothing Manipulation and Hygiene: Supervision/safety Where Assessed - Toileting Clothing Manipulation and Hygiene: Sit to stand from 3-in-1 or toilet Transfers/Ambulation Related to ADLs: Pt abandoned RW for toilet transfer and needed min v/c to stay with RW. Pt demonstrated continued carry over ADL Comments: Next session to address EC techniques for home to help with self monitoring of energy level. Pt asleep on arrival this session demonstrate fatigue from AM hour activities    OT Diagnosis: Generalized weakness  OT Problem List: Decreased strength;Decreased activity tolerance;Impaired balance (sitting and/or standing);Decreased safety awareness;Decreased knowledge of use of DME or AE;Decreased knowledge of precautions OT Treatment Interventions: Self-care/ADL training;DME and/or AE  instruction;Energy conservation;Therapeutic activities;Patient/family education;Balance training   OT Goals Acute Rehab OT Goals OT Goal Formulation: With patient Time For Goal Achievement: 06/07/12 Potential to Achieve Goals: Good Miscellaneous OT Goals Miscellaneous OT Goal #1: Pt will complete UB / LB dressing without need for rest break OT Goal: Miscellaneous Goal #1 - Progress: Goal set today Miscellaneous OT Goal #2: Pt will verbalize two energy conservation techniques to use at home OT Goal: Miscellaneous Goal #2 - Progress: Goal set today  Visit Information  Last OT Received On: 05/24/12 Assistance Needed: +1    Subjective Data  Subjective: "the therapist I had last time told me I was the strongest they had ever seen me" Patient Stated Goal: to return home   Prior Functioning     Home Living Lives With: Other (Comment) (nephew) Available Help at Discharge: Available PRN/intermittently;Friend(s) Type of Home: House Home Access: Level entry Home Layout: One level Bathroom Shower/Tub: Engineer, manufacturing systems: Handicapped height Bathroom Accessibility: Yes Home Adaptive Equipment: Bedside commode/3-in-1;Straight cane Prior Function Level of Independence: Independent with assistive device(s) Driving: No Comments: Mostly microwaves meals; Neice helps with grocery shopping Communication Communication: No difficulties Dominant Hand: Right         Vision/Perception Vision - History Baseline Vision: Other (comment) (blurred vision - has MD appointment pending)   Cognition  Cognition Arousal/Alertness: Awake/alert Behavior During Therapy: WFL for tasks assessed/performed Overall Cognitive Status: Within Functional Limits for tasks assessed    Extremity/Trunk Assessment Right Upper Extremity Assessment RUE ROM/Strength/Tone: [facility] for tasks assessed Left Upper Extremity Assessment LUE ROM/Strength/Tone: WFL for tasks assessed     Mobility Bed  Mobility Bed Mobility: Supine to Sit;Sitting - Scoot to Edge of Bed Supine to Sit: 5: Supervision;HOB elevated;With rails Sitting - Scoot to [place] of  Bed: 5: Supervision Sit to Supine: 5: Supervision;With rail Transfers Sit to Stand: 5: Supervision;With upper extremity assist;From bed Stand to Sit: 5: Supervision;With upper extremity assist;To bed Details for Transfer Assistance: Cues for hand placement & technique.       Exercise     Balance     End of Session OT - End of Session Activity Tolerance: Patient tolerated treatment well (reports activity <5 out 10 fatigue level) Patient left: in bed;with call bell/phone within reach Nurse Communication: Mobility status;Precautions  GO     Lucile Shutters 05/24/2012, 4:13 PM Pager: [phone]

## 2012-05-25 DIAGNOSIS — I10 Essential (primary) hypertension: Secondary | ICD-10-CM

## 2012-05-25 LAB — GLUCOSE, CAPILLARY: Glucose-Capillary: 286 mg/dL — ABNORMAL HIGH (ref 70–99)

## 2012-05-25 MED ORDER — CEPHALEXIN 500 MG PO CAPS: 500.0000 mg | ORAL_CAPSULE | Freq: Four times a day (QID) | ORAL | Status: DC

## 2012-05-25 MED ORDER — FUROSEMIDE 40 MG PO TABS: 40.0000 mg | ORAL_TABLET | Freq: Two times a day (BID) | ORAL | Status: DC

## 2012-05-25 MED ORDER — INSULIN GLARGINE 100 UNIT/ML ~~LOC~~ SOLN
80.0000 [IU] | Freq: Every day | SUBCUTANEOUS | Status: DC
  Filled 2012-05-25: qty 0.8

## 2012-05-25 MED ORDER — INSULIN GLARGINE 100 UNIT/ML ~~LOC~~ SOLN: 80.0000 [IU] | Freq: Every day | SUBCUTANEOUS | Status: DC

## 2012-05-25 NOTE — Progress Notes (Signed)
Physical Therapy Treatment Patient Details Name: Cathy [name] MRN: [id number] DOB: October 15, 1930 Today's Date: 05/25/2012 Time: 7829-5621 PT Time Calculation (min): 20 min  PT Assessment / Plan / Recommendation Comments on Treatment Session       Follow Up Recommendations  Home health PT;Supervision - Intermittent (HHOT, HHAide, Meals on Wheels.  )     Does the patient have the potential to tolerate intense rehabilitation     Barriers to Discharge        Equipment Recommendations  Rolling walker with 5" wheels    Recommendations for Other Services    Frequency Min 3X/week   Plan Discharge plan remains appropriate    Precautions / Restrictions Precautions Precautions: Fall       Mobility  Bed Mobility Bed Mobility: Supine to Sit;Sitting - Scoot to Edge of Bed Supine to Sit: 6: Modified independent (Device/Increase time);With rails;HOB elevated Sitting - Scoot to Edge of Bed: 6: Modified independent (Device/Increase time) Details for Bed Mobility Assistance: Pt sitting EOB at end of session Transfers Transfers: Sit to Stand;Stand to Sit Sit to Stand: 6: Modified independent (Device/Increase time);With upper extremity assist;From bed;From toilet Stand to Sit: 6: Modified independent (Device/Increase time);With upper extremity assist;To bed;To toilet Ambulation/Gait Ambulation/Gait Assistance: 5: Supervision Ambulation Distance (Feet): 120 Feet Assistive device: Rolling walker Ambulation/Gait Assistance Details: Cues for tall posture & to stay closer to RW.   Gait Pattern: Step-through pattern;Decreased stride length;Trunk flexed (decreased step height) Gait velocity: decreased General Gait Details: Denies chest pain.   Stairs: No Wheelchair Mobility Wheelchair Mobility: No     PT Goals Acute Rehab PT Goals Time For Goal Achievement: 06/06/12 Potential to Achieve Goals: Good Pt will go Supine/Side to Sit: with modified independence PT Goal: Supine/Side to Sit -  Progress: Met Pt will go Sit to Supine/Side: with modified independence Pt will go Sit to Stand: with modified independence PT Goal: Sit to Stand - Progress: Met Pt will go Stand to Sit: with modified independence PT Goal: Stand to Sit - Progress: Met Pt will Ambulate: 51 - 150 feet;with modified independence;with rolling walker PT Goal: Ambulate - Progress: Partly met Additional Goals Additional Goal #1: Pt will appropriately self-monitor for activity tolerance and stop activity before the onset of anginal pain PT Goal: Additional Goal #1 - Progress: Progressing toward goal  Visit Information  Last PT Received On: 05/25/12 Assistance Needed: +1    Subjective Data      Cognition  Cognition Arousal/Alertness: Awake/alert Behavior During Therapy: WFL for tasks assessed/performed Overall Cognitive Status: Within Functional Limits for tasks assessed    Balance  Balance Balance Assessed: Yes Static Standing Balance Static Standing - Balance Support: No upper extremity supported;During functional activity Static Standing - Level of Assistance: 5: Stand by assistance Static Standing - Comment/# of Minutes: 2 mins  End of Session PT - End of Session Equipment Utilized During Treatment: Gait belt Activity Tolerance: Patient tolerated treatment well Patient left: with call bell/phone within reach (sitting EOB per pt's request) Nurse Communication: Mobility status     Cathy [name], [place] [phone] 05/25/2012

## 2012-05-25 NOTE — Discharge Summary (Signed)
Physician Discharge Summary  Cathy [name]:[id number] DOB: 1930/09/23 DOA: 05/20/2012  PCP: Milinda Antis, MD  Admit date: 05/20/2012 Discharge date: 05/25/2012  Time spent: 35 minutes  Recommendations for Outpatient Follow-up:  Home health PT/OT, RN TSH 6 weeks  Discharge Diagnoses:  Active Problems:   Hypertension   Type II or unspecified type diabetes mellitus without mention of complication, not stated as uncontrolled   Cardiomyopathy, ischemic   Discharge Condition: improved  Diet recommendation: cardiac/diabetic  Filed Weights   05/23/12 0533 05/24/12 0601 05/25/12 0530  Weight: 84.823 kg (187 lb) 83.598 kg (184 lb 4.8 oz) 83.235 kg (183 lb 8 oz)    History of present illness:  Cathy [name] is a 79 y.o. female who was recently admitted last month for acute ST elevation MI and had cardiac catheter stent placed had gone for regular followup with her primary care physician. Over there patient was found to be weak and hypotensive and was transferred to the ER for further workup. Patient states over the last 2 days patient has been feeling nauseated and has had subjective feeling of fever and chills. Patient was feeling very weak and also has been having chest pain. Patient states that her chest pain has been there off and on since last month. Denies any associated shortness of breath productive cough or abdominal pain. In the ER patient was found to be hypotensive and labs show leukocytosis and elevated lactic acid levels. UA was compatible with UTI. EKG was showing some lateral T wave inversion but cardiac enzymes were negative. Patient has been given a total of 1.5 L normal saline bolus. At this time patient pressure has improved. Patient will be admitted for further management of her sepsis most likely secondary UTI. Patient states since her discharge last month patient has been taking ciprofloxacin for UTI. She has taken 2 courses of ciprofloxacin for a total of 15 days. Patient  states she has chronic diarrhea from previous colectomy for endometriosis. Has noticed no blood in the diarrhea.   Hospital Course:  Hypotension w/ lactic acidosis  Likely due to dehydration (ongoing diuretic use despite poor intake in setting of UTI) plus possible "early sepsis" - improving rapidly with volume expansion, and now resolved   Urinary tract infection  Suspect drug-resistant gram-negative organism - culture not helpful in isolating specific oraganism - continue empiric antibiotic coverage as pt is clinically improving and WBC decreasing - plan to transition to ceftin when ready for oral med to complete a 10 day tx course   Coronary artery disease w/ anterior MI 04/15/12 s/p PTCA-LCx  S/p CABG 2003 - troponin negative x5 total - had episode of CP last night, but quickly resolved w/ nitro (was off imdur and BB at time due to hypotension) - repeat troponins remained negative and f/u EKGs were stable -  resumption of imdur and BB - remains on aspirin and Plavix   - scheduled appointment with Dr. Myrtis Ser for later this month  Cardiomyopathy, ischemic  Echo 04/19/12: EF 25-30%, mod LVH, diffuse HK, trivial AI - appears well compensated at present - follow very closely due to initial need for volume expansion - watch daily weight and Is & Os   Severe asymptomatic right internal carotid artery stenosis  - will need to follow up with Dr. Hassell Halim for timing of this surgery   AKI (acute kidney injury)  GFR 55 at admit - renal function has now normalized   HTN  Blood pressure rebounded with volume and holding  of meds - have resumed home meds   HLD  Reported intolerance to statins - diet controlled only   DM  CBG is trending up - increase lantus -will need h/h RN to help with teaching   Hypothyroidsim  TSH is actually somewhat over suppressed - free T4 ok  Ideally should probably go to rehab- but patient refuses, will arrange home  health  Procedures:  none  Consultations:  none  Discharge Exam: Filed Vitals:   05/24/12 1454 05/24/12 1810 05/24/12 2000 05/25/12 0530  BP: 127/60 140/58 128/62 141/63  Pulse: 87 77  80  Temp: 98.5 F (36.9 C)  97.8 F (36.6 C) 98.1 F (36.7 C)  TempSrc: Oral  Oral Oral  Resp: 18  18 18   Height:      Weight:    83.235 kg (183 lb 8 oz)  SpO2: 95%  95% 93%    General: A+Ox3, NAD Cardiovascular: rrr Respiratory: clear  Discharge Instructions      Discharge Orders   Future Appointments Provider Department Dept Phone   06/09/2012 1:00 PM Prescott Parma, PA-C [facility] [place] (near [place]) [phone]   Future Orders Complete By Expires     Diet - low sodium heart healthy  As directed     Diet Carb Modified  As directed     Discharge instructions  As directed     Comments:      Home health PT/OT/RN Check BS and bring to PCP    Increase activity slowly  As directed         Medication List    STOP taking these medications       ciprofloxacin 500 MG tablet  Commonly known as:  CIPRO     loperamide 2 MG capsule  Commonly known as:  IMODIUM      TAKE these medications       aspirin EC 81 MG tablet  Take 1 tablet (81 mg total) by mouth daily.     carvedilol 25 MG tablet  Commonly known as:  COREG  Take 12.5 mg by mouth 2 (two) times daily with a meal.     cephALEXin 500 MG capsule  Commonly known as:  KEFLEX  Take 1 capsule (500 mg total) by mouth 4 (four) times daily.     cetirizine 10 MG tablet  Commonly known as:  ZYRTEC  TAKE ONE TABLET DAILY.     clopidogrel 75 MG tablet  Commonly known as:  PLAVIX  Take 1 tablet (75 mg total) by mouth daily.     diphenhydrAMINE 25 MG tablet  Commonly known as:  SOMINEX  Take 25 mg by mouth at bedtime as needed for sleep.     fluticasone 50 MCG/ACT nasal spray  Commonly known as:  FLONASE  Place 1 spray into the nose daily as needed. For allergies     folic acid 1 MG tablet  Commonly known as:   FOLVITE  Take 1 mg by mouth daily.     furosemide 40 MG tablet  Commonly known as:  LASIX  Take 1 tablet (40 mg total) by mouth 2 (two) times daily.     HUMALOG 100 UNIT/ML injection  Generic drug:  insulin lispro  INJECT 2 TO 10 UNITS SUBCUTANEOUSLY FOUR TIMES DAILY AS DIRECTED USING SLIDING SCALE.     HYDROcodone-acetaminophen 5-325 MG per tablet  Commonly known as:  NORCO/VICODIN  Take 1 tablet by mouth every 6 (six) hours as needed for pain.     insulin  glargine 100 UNIT/ML injection  Commonly known as:  LANTUS  Inject 0.8 mLs (80 Units total) into the skin at bedtime.     isosorbide mononitrate 30 MG 24 hr tablet  Commonly known as:  IMDUR  Take 30 mg by mouth daily.     levothyroxine 112 MCG tablet  Commonly known as:  SYNTHROID, LEVOTHROID  Take 56 mcg by mouth daily.     meclizine 25 MG tablet  Commonly known as:  ANTIVERT  Take 25 mg by mouth 3 (three) times daily as needed for dizziness.     metFORMIN 500 MG tablet  Commonly known as:  GLUCOPHAGE  TAKE 2 TABLETS TWICE DAILY WITH FOOD AS DIRECTED.     NITROSTAT 0.4 MG SL tablet  Generic drug:  nitroGLYCERIN  Place 0.4 mg under the tongue every 5 (five) minutes as needed for chest pain. May repeat times three.     omega-3 acid ethyl esters 1 G capsule  Commonly known as:  LOVAZA  Take 2 capsules (2 g total) by mouth 2 (two) times daily.     promethazine 25 MG tablet  Commonly known as:  PHENERGAN  TAKE 1 TABLET EVERY 4 TO 6 HOURS AS NEEDED FOR NAUSEA AND VOMITING.     ramipril 5 MG capsule  Commonly known as:  ALTACE  Take 1 capsule (5 mg total) by mouth daily.       Allergies  Allergen Reactions  . Atorvastatin Other (See Comments)    Unknown-patient admitted to hospital after taking  . Citrus Dermatitis    BREAK OUT ON BODY  . Iodine   . Iohexol      Desc: ANAPHYLAXIS   . Zolpidem Tartrate     REACTION: hallucinations  . Milk-Related Compounds Rash   Follow-up Information   Follow up with  [name], MD. (May 21st at 1 PM)    Contact information:   [address] [place], [place] [address]        The results of significant diagnostics from this hospitalization (including imaging, microbiology, ancillary and laboratory) are listed below for reference.    Significant Diagnostic Studies: Dg Chest 2 View  05/20/2012  *RADIOLOGY REPORT*  Clinical Data: Chest pain.  Shortness of breath.  Nausea. Hypotension.  Myocardial infarction 1 month ago.  CHEST - 2 VIEW  Comparison: 04/16/2012  Findings: Prior CABG noted.  Borderline cardiomegaly.  No pulmonary edema noted.  The lungs appear grossly clear.  Reverse lordotic positioning employed on the frontal projection.  No definite pleural effusion observed although the left posterior costophrenic angle is clipped.  IMPRESSION:  1.  Borderline cardiomegaly.  No edema or specific additional acute radiographic abnormalities.   Original Report Authenticated By: Gaylyn Rong, M.D.     Microbiology: Recent Results (from the past 240 hour(s))  URINE CULTURE     Status: None   Collection Time    05/20/12  6:01 PM      Result Value Range Status   Specimen Description URINE, RANDOM   Final   Special Requests NONE   Final   Culture  Setup Time 05/20/2012 18:58   Final   Colony Count >=100,000 COLONIES/ML   Final   Culture     Final   Value: Multiple bacterial morphotypes present, none predominant. Suggest appropriate recollection if clinically indicated.   Report Status 05/21/2012 FINAL   Final  CULTURE, BLOOD (ROUTINE X 2)     Status: None   Collection Time    05/20/12  6:58 PM      Result Value Range Status   Specimen Description BLOOD ARM RIGHT   Final   Special Requests BOTTLES DRAWN AEROBIC AND ANAEROBIC 10CC   Final   Culture  Setup Time 05/21/2012 02:23   Final   Culture     Final   Value:        BLOOD CULTURE RECEIVED NO GROWTH TO DATE CULTURE WILL BE HELD FOR 5 DAYS BEFORE ISSUING A FINAL NEGATIVE REPORT   Report Status PENDING    Incomplete  CULTURE, BLOOD (ROUTINE X 2)     Status: None   Collection Time    05/20/12  7:07 PM      Result Value Range Status   Specimen Description BLOOD HAND RIGHT   Final   Special Requests BOTTLES DRAWN AEROBIC ONLY 1CC   Final   Culture  Setup Time 05/21/2012 02:22   Final   Culture     Final   Value:        BLOOD CULTURE RECEIVED NO GROWTH TO DATE CULTURE WILL BE HELD FOR 5 DAYS BEFORE ISSUING A FINAL NEGATIVE REPORT   Report Status PENDING   Incomplete  MRSA PCR SCREENING     Status: None   Collection Time    05/21/12 12:55 AM      Result Value Range Status   MRSA by PCR NEGATIVE  NEGATIVE Final   Comment:            The GeneXpert MRSA Assay (FDA     approved for NASAL specimens     only), is one component of a     comprehensive MRSA colonization     surveillance program. It is not     intended to diagnose MRSA     infection nor to guide or     monitor treatment for     MRSA infections.     Labs: Basic Metabolic Panel:  Recent Labs Lab 05/20/12 1723 05/20/12 2355 05/21/12 0525 05/22/12 0620 05/23/12 0046 05/24/12 0550  NA 142  --  139 140 135 139  K 4.6  --  3.8 4.1 4.0 4.2  CL 103  --  107 106 100 100  CO2 27  --  23 25 23 27   GLUCOSE 107*  --  226* 227* 354* 289*  BUN 22  --  22 13 16 13   CREATININE 1.31* 1.07 0.79 0.64 0.71 0.64  CALCIUM 10.3  --  9.1 9.3 8.9 9.3   Liver Function Tests:  Recent Labs Lab 05/20/12 2138 05/21/12 0525  AST 12 10  ALT 6 6  ALKPHOS 59 62  BILITOT 0.3 0.3  PROT 6.0 5.6*  ALBUMIN 2.9* 2.5*    Recent Labs Lab 05/20/12 2138  LIPASE 27   No results found for this basename: AMMONIA,  in the last 168 hours CBC:  Recent Labs Lab 05/20/12 1723 05/20/12 2355 05/21/12 0525 05/22/12 0620 05/23/12 0046 05/24/12 0550  WBC 13.9* 10.8* 10.4 8.6 10.4 10.5  NEUTROABS 8.7*  --  4.7  --   --   --   HGB 14.7 12.1 12.0 11.9* 12.3 13.3  HCT 43.0 36.9 35.7* 35.7* 36.9 40.2  MCV 88.1 88.1 87.7 88.4 87.2 88.5  PLT 267  223 205 202 208 232   Cardiac Enzymes:  Recent Labs Lab 05/22/12 0620 05/22/12 1905 05/23/12 0046 05/23/12 0710 05/23/12 1811  TROPONINI <0.30 <0.30 <0.30 <0.30 <0.30   BNP: BNP (last 3 results)  Recent Labs  10/24/11  1621  PROBNP 685.8*   CBG:  Recent Labs Lab 05/24/12 0720 05/24/12 1131 05/24/12 1701 05/24/12 2127 05/25/12 0758  GLUCAP 287* 309* 323* 279* 286*       Signed:  Kedar Sedano  [facility] 05/25/2012, 8:42 AM

## 2012-05-25 NOTE — Progress Notes (Addendum)
D/c orders received;IV removed with gauze on,, pt remains in stable condition, pt meds and instructions reviewed and given to pt; pt d/c to home; called pts prescriptions into her pharmacy and spoke with Lorin Picket

## 2012-05-25 NOTE — Progress Notes (Signed)
Occupational Therapy Treatment Patient Details Name: Cathy [name] MRN: [id number] DOB: 1930/06/27 Today's Date: 05/25/2012 Time: 6213-0865 OT Time Calculation (min): 18 min  OT Assessment / Plan / Recommendation Comments on Treatment Session Pt is adequate level for d/c home . ALL ot education complete    Follow Up Recommendations  Home health OT    Barriers to Discharge       Equipment Recommendations       Recommendations for Other Services    Frequency Min 2X/week   Plan      Precautions / Restrictions Precautions Precautions: Fall   Pertinent Vitals/Pain     ADL  Upper Body Dressing: Modified independent Where Assessed - Upper Body Dressing: Unsupported sitting Lower Body Dressing: Modified independent Where Assessed - Lower Body Dressing: Unsupported sitting Toilet Transfer: Supervision/safety Toilet Transfer Method: Sit to stand Toilet Transfer Equipment: Raised toilet seat with arms (or 3-in-1 over toilet) ADL Comments: Pt educated on EC techniques and read the paper copy due to visual deficits. Pt agreeable to [facility] techniques and correctly using multiple approaches at baseline. pt completed UB and LB dressing for d/c home without need for assistance. Pt is at adequate level for d/c    OT Diagnosis:    OT Problem List:   OT Treatment Interventions:     OT Goals Acute Rehab OT Goals OT Goal Formulation: With patient Time For Goal Achievement: 06/07/12 Potential to Achieve Goals: Good Miscellaneous OT Goals Miscellaneous OT Goal #1: Pt will complete UB / LB dressing without need for rest break OT Goal: Miscellaneous Goal #1 - Progress: Met Miscellaneous OT Goal #2: Pt will verbalize two energy conservation techniques to use at home OT Goal: Miscellaneous Goal #2 - Progress: Met  Visit Information  Last OT Received On: 05/25/12 Assistance Needed: +1    Subjective Data      Prior Functioning       Cognition  Cognition Arousal/Alertness:  Awake/alert Behavior During Therapy: WFL for tasks assessed/performed Overall Cognitive Status: Within Functional Limits for tasks assessed    Mobility  Bed Mobility Bed Mobility: Supine to Sit;Sitting - Scoot to Edge of Bed Supine to Sit: 6: Modified independent (Device/Increase time) Sitting - Scoot to Edge of Bed: 6: Modified independent (Device/Increase time) Details for Bed Mobility Assistance: Pt sitting EOB at end of session Transfers Sit to Stand: 6: Modified independent (Device/Increase time) Stand to Sit: 6: Modified independent (Device/Increase time)    Exercises      Balance Balance Balance Assessed: Yes Static Standing Balance Static Standing - Balance Support: No upper extremity supported;During functional activity Static Standing - Level of Assistance: 5: Stand by assistance Static Standing - Comment/# of Minutes: 2 mins   End of Session OT - End of Session Activity Tolerance: Patient tolerated treatment well Patient left: in bed;with call bell/phone within reach Nurse Communication: Mobility status  GO     Harrel Carina [facility] 05/25/2012, 11:18 AM Pager: [phone]

## 2012-05-27 ENCOUNTER — Emergency Department (HOSPITAL_COMMUNITY): Payer: Medicare Other

## 2012-05-27 ENCOUNTER — Inpatient Hospital Stay (HOSPITAL_COMMUNITY)
Admission: EM | Admit: 2012-05-27 | Discharge: 2012-05-30 | DRG: 315 | Disposition: A | Discharge status: Home Health | Payer: Medicare Other | Attending: Internal Medicine | Admitting: Internal Medicine

## 2012-05-27 ENCOUNTER — Encounter (HOSPITAL_COMMUNITY): Payer: Self-pay | Admitting: *Deleted

## 2012-05-27 DIAGNOSIS — I129 Hypertensive chronic kidney disease with stage 1 through stage 4 chronic kidney disease, or unspecified chronic kidney disease: Secondary | ICD-10-CM | POA: Diagnosis present

## 2012-05-27 DIAGNOSIS — I252 Old myocardial infarction: Secondary | ICD-10-CM

## 2012-05-27 DIAGNOSIS — K3184 Gastroparesis: Secondary | ICD-10-CM | POA: Diagnosis present

## 2012-05-27 DIAGNOSIS — R7989 Other specified abnormal findings of blood chemistry: Secondary | ICD-10-CM | POA: Diagnosis present

## 2012-05-27 DIAGNOSIS — I509 Heart failure, unspecified: Secondary | ICD-10-CM | POA: Diagnosis present

## 2012-05-27 DIAGNOSIS — Z90710 Acquired absence of both cervix and uterus: Secondary | ICD-10-CM

## 2012-05-27 DIAGNOSIS — R55 Syncope and collapse: Secondary | ICD-10-CM | POA: Diagnosis present

## 2012-05-27 DIAGNOSIS — Z794 Long term (current) use of insulin: Secondary | ICD-10-CM

## 2012-05-27 DIAGNOSIS — F3289 Other specified depressive episodes: Secondary | ICD-10-CM | POA: Diagnosis present

## 2012-05-27 DIAGNOSIS — Z91041 Radiographic dye allergy status: Secondary | ICD-10-CM

## 2012-05-27 DIAGNOSIS — E872 Acidosis, unspecified: Secondary | ICD-10-CM | POA: Diagnosis present

## 2012-05-27 DIAGNOSIS — I6529 Occlusion and stenosis of unspecified carotid artery: Secondary | ICD-10-CM | POA: Diagnosis present

## 2012-05-27 DIAGNOSIS — Z951 Presence of aortocoronary bypass graft: Secondary | ICD-10-CM

## 2012-05-27 DIAGNOSIS — Z79899 Other long term (current) drug therapy: Secondary | ICD-10-CM

## 2012-05-27 DIAGNOSIS — N189 Chronic kidney disease, unspecified: Secondary | ICD-10-CM | POA: Diagnosis present

## 2012-05-27 DIAGNOSIS — I5022 Chronic systolic (congestive) heart failure: Secondary | ICD-10-CM | POA: Diagnosis present

## 2012-05-27 DIAGNOSIS — F329 Major depressive disorder, single episode, unspecified: Secondary | ICD-10-CM | POA: Diagnosis present

## 2012-05-27 DIAGNOSIS — E785 Hyperlipidemia, unspecified: Secondary | ICD-10-CM | POA: Diagnosis present

## 2012-05-27 DIAGNOSIS — E119 Type 2 diabetes mellitus without complications: Secondary | ICD-10-CM | POA: Diagnosis present

## 2012-05-27 DIAGNOSIS — N390 Urinary tract infection, site not specified: Secondary | ICD-10-CM | POA: Diagnosis present

## 2012-05-27 DIAGNOSIS — I959 Hypotension, unspecified: Principal | ICD-10-CM | POA: Diagnosis present

## 2012-05-27 DIAGNOSIS — I2510 Atherosclerotic heart disease of native coronary artery without angina pectoris: Secondary | ICD-10-CM | POA: Diagnosis present

## 2012-05-27 DIAGNOSIS — Z9861 Coronary angioplasty status: Secondary | ICD-10-CM

## 2012-05-27 DIAGNOSIS — I779 Disorder of arteries and arterioles, unspecified: Secondary | ICD-10-CM | POA: Diagnosis present

## 2012-05-27 DIAGNOSIS — E039 Hypothyroidism, unspecified: Secondary | ICD-10-CM | POA: Diagnosis present

## 2012-05-27 LAB — CULTURE, BLOOD (ROUTINE X 2): Culture: NO GROWTH

## 2012-05-27 LAB — URINALYSIS, ROUTINE W REFLEX MICROSCOPIC
Ketones, ur: NEGATIVE mg/dL
Nitrite: NEGATIVE
Protein, ur: NEGATIVE mg/dL
pH: 5.5 (ref 5.0–8.0)

## 2012-05-27 LAB — CBC WITH DIFFERENTIAL/PLATELET
Basophils Absolute: 0 K/uL (ref 0.0–0.1)
Basophils Relative: 0 % (ref 0–1)
Eosinophils Absolute: 0.2 K/uL (ref 0.0–0.7)
Eosinophils Relative: 2 % (ref 0–5)
HCT: 37.2 % (ref 36.0–46.0)
Hemoglobin: 12.5 g/dL (ref 12.0–15.0)
Lymphocytes Relative: 44 % (ref 12–46)
Lymphs Abs: 4.3 K/uL — ABNORMAL HIGH (ref 0.7–4.0)
MCH: 29.9 pg (ref 26.0–34.0)
MCHC: 33.6 g/dL (ref 30.0–36.0)
MCV: 89 fL (ref 78.0–100.0)
Monocytes Absolute: 0.7 K/uL (ref 0.1–1.0)
Monocytes Relative: 8 % (ref 3–12)
Neutro Abs: 4.6 K/uL (ref 1.7–7.7)
Neutrophils Relative %: 47 % (ref 43–77)
Platelets: 243 K/uL (ref 150–400)
RBC: 4.18 MIL/uL (ref 3.87–5.11)
RDW: 13.8 % (ref 11.5–15.5)
WBC: 9.9 K/uL (ref 4.0–10.5)

## 2012-05-27 LAB — TROPONIN I
Troponin I: <0.3 ng/mL (ref <0.30)
Troponin I: <0.3 ng/mL (ref <0.30)

## 2012-05-27 LAB — LACTIC ACID, PLASMA: Lactic Acid, Venous: 2.7 mmol/L — ABNORMAL HIGH (ref 0.5–2.2)

## 2012-05-27 LAB — BASIC METABOLIC PANEL
Chloride: 104 mEq/L (ref 96–112)
Creatinine, Ser: 0.88 mg/dL (ref 0.50–1.10)
GFR calc Af Amer: 70 mL/min — ABNORMAL LOW (ref >90)
Potassium: 4.3 mEq/L (ref 3.5–5.1)
Sodium: 141 mEq/L (ref 135–145)

## 2012-05-27 LAB — URINE MICROSCOPIC-ADD ON

## 2012-05-27 MED ORDER — LEVOTHYROXINE SODIUM 112 MCG PO TABS
56.0000 ug | ORAL_TABLET | Freq: Every day | ORAL | Status: DC
  Administered 2012-05-28 – 2012-05-30 (×3): 56 ug via ORAL
  Filled 2012-05-27 (×4): qty 0.5

## 2012-05-27 MED ORDER — OMEGA-3-ACID ETHYL ESTERS 1 G PO CAPS
2.0000 g | ORAL_CAPSULE | Freq: Two times a day (BID) | ORAL | Status: DC
  Administered 2012-05-27 – 2012-05-30 (×6): 2 g via ORAL
  Filled 2012-05-27: qty 1
  Filled 2012-05-27 (×5): qty 2

## 2012-05-27 MED ORDER — OXYCODONE HCL 5 MG PO TABS
5.0000 mg | ORAL_TABLET | ORAL | Status: DC | PRN
  Administered 2012-05-29 – 2012-05-30 (×6): 5 mg via ORAL
  Filled 2012-05-27 (×6): qty 1

## 2012-05-27 MED ORDER — PIPERACILLIN-TAZOBACTAM 3.375 G IVPB
3.3750 g | Freq: Three times a day (TID) | INTRAVENOUS | Status: DC
  Administered 2012-05-27 – 2012-05-28 (×3): 3.375 g via INTRAVENOUS
  Filled 2012-05-27 (×12): qty 50

## 2012-05-27 MED ORDER — DOCUSATE SODIUM 100 MG PO CAPS
100.0000 mg | ORAL_CAPSULE | Freq: Two times a day (BID) | ORAL | Status: DC
  Administered 2012-05-27 – 2012-05-30 (×6): 100 mg via ORAL
  Filled 2012-05-27 (×6): qty 1

## 2012-05-27 MED ORDER — ONDANSETRON HCL 4 MG/2ML IJ SOLN
4.0000 mg | Freq: Four times a day (QID) | INTRAMUSCULAR | Status: DC | PRN
  Administered 2012-05-28: 4 mg via INTRAVENOUS
  Filled 2012-05-27: qty 2

## 2012-05-27 MED ORDER — ALUM & MAG HYDROXIDE-SIMETH 200-200-20 MG/5ML PO SUSP: 30.0000 mL | Freq: Four times a day (QID) | ORAL | Status: DC | PRN

## 2012-05-27 MED ORDER — ASPIRIN EC 81 MG PO TBEC
81.0000 mg | DELAYED_RELEASE_TABLET | Freq: Every morning | ORAL | Status: DC
  Administered 2012-05-28 – 2012-05-30 (×3): 81 mg via ORAL
  Filled 2012-05-27 (×3): qty 1

## 2012-05-27 MED ORDER — ACETAMINOPHEN 650 MG RE SUPP: 650.0000 mg | Freq: Four times a day (QID) | RECTAL | Status: DC | PRN

## 2012-05-27 MED ORDER — SODIUM CHLORIDE 0.9 % IV SOLN: INTRAVENOUS | Status: AC

## 2012-05-27 MED ORDER — RAMIPRIL 2.5 MG PO CAPS: 5.0000 mg | ORAL_CAPSULE | Freq: Every morning | ORAL | Status: DC

## 2012-05-27 MED ORDER — ACETAMINOPHEN 325 MG PO TABS
650.0000 mg | ORAL_TABLET | Freq: Four times a day (QID) | ORAL | Status: DC | PRN
  Administered 2012-05-29 – 2012-05-30 (×2): 650 mg via ORAL
  Filled 2012-05-27 (×2): qty 2

## 2012-05-27 MED ORDER — SODIUM CHLORIDE 0.9 % IV BOLUS (SEPSIS)
1000.0000 mL | Freq: Once | INTRAVENOUS | Status: AC
  Administered 2012-05-27: 1000 mL via INTRAVENOUS

## 2012-05-27 MED ORDER — SODIUM CHLORIDE 0.9 % IV SOLN
INTRAVENOUS | Status: DC
Start: 2012-05-27 — End: 2012-05-29
  Administered 2012-05-27 – 2012-05-28 (×2): via INTRAVENOUS

## 2012-05-27 MED ORDER — SODIUM CHLORIDE 0.9 % IJ SOLN
3.0000 mL | Freq: Two times a day (BID) | INTRAMUSCULAR | Status: DC
  Administered 2012-05-28 – 2012-05-30 (×3): 3 mL via INTRAVENOUS

## 2012-05-27 MED ORDER — ONDANSETRON HCL 4 MG/2ML IJ SOLN
4.0000 mg | Freq: Once | INTRAMUSCULAR | Status: AC
  Administered 2012-05-27: 4 mg via INTRAVENOUS

## 2012-05-27 MED ORDER — ONDANSETRON HCL 4 MG/2ML IJ SOLN
INTRAMUSCULAR | Status: AC
  Filled 2012-05-27: qty 2

## 2012-05-27 MED ORDER — PIPERACILLIN-TAZOBACTAM 3.375 G IVPB
INTRAVENOUS | Status: AC
  Filled 2012-05-27: qty 100

## 2012-05-27 MED ORDER — CLOPIDOGREL BISULFATE 75 MG PO TABS
75.0000 mg | ORAL_TABLET | Freq: Every day | ORAL | Status: DC
  Administered 2012-05-28 – 2012-05-30 (×3): 75 mg via ORAL
  Filled 2012-05-27 (×3): qty 1

## 2012-05-27 MED ORDER — ONDANSETRON HCL 4 MG PO TABS
4.0000 mg | ORAL_TABLET | Freq: Four times a day (QID) | ORAL | Status: DC | PRN
  Administered 2012-05-29: 4 mg via ORAL
  Filled 2012-05-27: qty 1

## 2012-05-27 NOTE — ED Notes (Signed)
Pt states feel ok after eating meal tray. Pt eating pack of crackers at present.

## 2012-05-27 NOTE — ED Notes (Signed)
Pt assisted to bedside commode & back to bed w/o complication. Pt states she feels ok both sitting & standing.

## 2012-05-27 NOTE — ED Notes (Signed)
Pt states feelin ok, wanting something to eat at this time. Pt advised would try to get something for her.

## 2012-05-27 NOTE — ED Notes (Signed)
Pt given a meal tray at this time.

## 2012-05-27 NOTE — ED Notes (Signed)
Patient becomes diaphoretic and pale if sitting up .

## 2012-05-27 NOTE — ED Provider Notes (Signed)
History     CSN: [id number]  Arrival date & time 05/27/12  1540   First MD Initiated Contact with Patient 05/27/12 1545      Chief Complaint  Patient presents with  . Loss of Consciousness    (Consider location/radiation/quality/duration/timing/severity/associated sxs/prior treatment) Patient is a 79 y.o. female presenting with syncope. The history is provided by the patient.  Loss of Consciousness   She was referred here by her PCP who noted low blood pressure. At trriage, she passed out when she sat up and was noted to be diaphoretic. Patient does state that she has some heaviness in her chest but is a very vague historian and is difficult to get information out of her. She denies nausea or vomiting. She had recently been admitted to the hospital and was discharged with a prescription for ciprofloxacin which he states did not seem to help her at all.  Past Medical History  Diagnosis Date  . Hypertension     Unspecified  . Hyperlipidemia     Mixed  . CAD (coronary artery disease) 08/21/09    a. Catheterization 2006 occluded OM vein graft b.   nuclear 2008 question slight ischemia c. anterior MI 04/15/12 s/p PTCA-LCx  . S/P CABG (coronary artery bypass graft) 2003     2003  LIMA, LAD, SVG to the OM, SVG right coronary artery  . Aneurysm 2003    Ascending thoracic aneurysm repair with a graft at time of CABG  . Diabetes mellitus     Insulin dependent  . Gastroparesis   . Depression   . Allergy history, radiographic dye     Contrast dye allergy  . Ejection fraction     EF 50%  //  Ejection fraction 25-30% at the time of stress echo,  May, 2013  . Previous back surgery   . Syncope 2008    Question?  2008  . Statin intolerance   . Shortness of breath     Exertional shortness of breath, May, 2013  . Systolic CHF     June, 2013  . Ischemic cardiomyopathy 06/2011    a. Echo 04/19/12: EF 25-30%, mod LVH, diffuse HK, trivial AI  . Thyroid disease   . CHF (congestive heart  failure)   . Nausea     October, 2013  . Hypotension     October, 2013  . Hypothyroidism     Treated with low-dose Synthroid  . CKD (chronic kidney disease)   . Carotid artery disease     Stenosis greater than 80%, asymptomatic, April, 2014, followed carefully by Dr. Arbie Cookey with consideration for possible endarterectomy  when overall medical status stabilizes    Past Surgical History  Procedure Laterality Date  . Coronary artery bypass graft  2003     LIMA, LAD, SVG to the OM, SVG right coronary artery  . Bladder tacking    . Salivary gland surgery    . Total abdominal hysterectomy    . Back surgery    . Breast surgery    . External ear surgery    . Cardiac catheterization  04/15/12    60-70% proximal LAD, 95% mid AV groove circumflex s/p PTCA, proximal RCA occlusion, patent LIMA to LAD, patent SVG to distal RCA, chronically occluded SVG to circumflex; LVEF 25-30%    Family History  Problem Relation Age of Onset  . Heart disease Other   . Heart disease Mother   . Hypertension Mother   . Heart disease Father   . Hypertension  Father     History  Substance Use Topics  . Smoking status: Never Smoker   . Smokeless tobacco: Never Used  . Alcohol Use: No    OB History   Grav Para Term Preterm Abortions TAB SAB Ect Mult Living                  Review of Systems  Cardiovascular: Positive for syncope.  All other systems reviewed and are negative.    Allergies  Atorvastatin; Citrus; Iodine; Iohexol; Zolpidem tartrate; and Milk-related compounds  Home Medications   Current Outpatient Rx  Name  Route  Sig  Dispense  Refill  . aspirin EC 81 MG tablet   Oral   Take 1 tablet (81 mg total) by mouth daily.           PATIENT RESIDES AT [facility]   . carvedilol (COREG) 25 MG tablet   Oral   Take 12.5 mg by mouth 2 (two) times daily with a meal.         . cephALEXin (KEFLEX) 500 MG capsule   Oral   Take 1 capsule (500 mg total) by mouth 4  (four) times daily.   10 capsule   0   . cetirizine (ZYRTEC) 10 MG tablet      TAKE ONE TABLET DAILY.   30 tablet   2   . clopidogrel (PLAVIX) 75 MG tablet   Oral   Take 1 tablet (75 mg total) by mouth daily.   30 tablet   6     PLEASE DELIVER TO PATIENT'S HOME   . diphenhydrAMINE (SOMINEX) 25 MG tablet   Oral   Take 25 mg by mouth at bedtime as needed for sleep.         . fluticasone (FLONASE) 50 MCG/ACT nasal spray   Nasal   Place 1 spray into the nose daily as needed. For allergies         . folic acid (FOLVITE) 1 MG tablet   Oral   Take 1 mg by mouth daily.         . furosemide (LASIX) 40 MG tablet   Oral   Take 1 tablet (40 mg total) by mouth 2 (two) times daily.   60 tablet   0   . HUMALOG 100 UNIT/ML injection      INJECT 2 TO 10 UNITS SUBCUTANEOUSLY FOUR TIMES DAILY AS DIRECTED USING SLIDING SCALE.   10 mL   1   . HYDROcodone-acetaminophen (NORCO/VICODIN) 5-325 MG per tablet   Oral   Take 1 tablet by mouth every 6 (six) hours as needed for pain.         [name] insulin glargine (LANTUS) 100 UNIT/ML injection   Subcutaneous   Inject 0.8 mLs (80 Units total) into the skin at bedtime.   10 mL   0   . isosorbide mononitrate (IMDUR) 30 MG 24 hr tablet   Oral   Take 30 mg by mouth daily.         [name] levothyroxine (SYNTHROID, LEVOTHROID) 112 MCG tablet   Oral   Take 56 mcg by mouth daily.         . meclizine (ANTIVERT) 25 MG tablet   Oral   Take 25 mg by mouth 3 (three) times daily as needed for dizziness.         . metFORMIN (GLUCOPHAGE) 500 MG tablet      TAKE 2 TABLETS TWICE DAILY WITH FOOD AS DIRECTED.  270 tablet   1   . nitroGLYCERIN (NITROSTAT) 0.4 MG SL tablet   Sublingual   Place 0.4 mg under the tongue every 5 (five) minutes as needed for chest pain. May repeat times three.         . omega-3 acid ethyl esters (LOVAZA) 1 G capsule   Oral   Take 2 capsules (2 g total) by mouth 2 (two) times daily.   60 capsule   6   .  promethazine (PHENERGAN) 25 MG tablet      TAKE 1 TABLET EVERY 4 TO 6 HOURS AS NEEDED FOR NAUSEA AND VOMITING.   30 tablet   0   . ramipril (ALTACE) 5 MG capsule   Oral   Take 1 capsule (5 mg total) by mouth daily.   30 capsule   3     BP 79/47  Pulse 72  Temp(Src) 98.6 F (37 C)  Resp 20  Ht 5\' 5"  (1.651 m)  Wt 181 lb (82.101 kg)  BMI 30.12 kg/m2  SpO2 96%  Physical Exam  Nursing note and vitals reviewed.  79 year old female, resting comfortably and in no acute distress. Vital signs are significant for hypotension with blood pressure 79/47. Oxygen saturation is 96%, which is normal. Head is normocephalic and atraumatic. PERRLA, EOMI. Oropharynx is clear. Neck is nontender and supple without adenopathy or JVD. Back is nontender and there is no CVA tenderness. Lungs are clear without rales, wheezes, or rhonchi. Chest is nontender. Heart has regular rate and rhythm without murmur. Abdomen is soft, flat, nontender without masses or hepatosplenomegaly and peristalsis is normoactive. Extremities have no cyanosis or edema, full range of motion is present. Skin is warm and dry without rash. Neurologic: Mental status is normal, cranial nerves are intact, there are no motor or sensory deficits.  ED Course  Procedures (including critical care time)  Labs Reviewed  CBC WITH DIFFERENTIAL  LACTIC ACID, PLASMA  TROPONIN I  BASIC METABOLIC PANEL  URINALYSIS, ROUTINE W REFLEX MICROSCOPIC   No results found.   Date: 05/27/2012  Rate: 72  Rhythm: normal sinus rhythm  QRS Axis: left  Intervals: normal  ST/T Wave abnormalities: T wave inversions in lateral and anterolateral leads  Conduction Disutrbances:nonspecific intraventricular conduction delay  Narrative Interpretation: Left axis deviation, left ventricular hypertrophy, nonspecific intraventricular conduction delay, old anteroseptal myocardial infarction, and T wave inversion in lateral and anterolateral leads which is  likely related to LVH. When compared with ECG of 05/23/2012, T wave changes are slightly more pronounced.  Old EKG Reviewed: unchanged    1. Hypotension   2. Elevated lactic acid level    CRITICAL CARE Performed by: WUXLK,GMWNU Total critical care time: 40 minutes Critical care time was exclusive of separately billable procedures and treating other patients. Critical care was necessary to treat or prevent imminent or life-threatening deterioration. Critical care was time spent personally by me on the following activities: development of treatment plan with patient and/or surrogate as well as nursing, discussions with consultants, evaluation of patient's response to treatment, examination of patient, obtaining history from patient or surrogate, ordering and performing treatments and interventions, ordering and review of laboratory studies, ordering and review of radiographic studies, pulse oximetry and re-evaluation of patient's condition.    MDM  Hypotension of uncertain cause. Old records have been reviewed and she was hypotensive when she was admitted to the hospital recently and was found to have a urinary tract infection which is why she was on ciprofloxacin. She  actually had been admitted with sepsis syndrome. She has a history of cardiomyopathy with ejection fraction of 25-30%. Because of complaints of chest heaviness along with hypertension, she will need cardiac evaluation.  Blood pressures come up with hydration. Lactic acid level has come back mildly elevated at 2.7. Remainder of workup is unremarkable. She will need to be admitted to monitor her blood pressure to make sure it is not dropping again. Case is been discussed with Dr. Phillips Odor of [facility] who agrees to admit the patient  Dione Booze, MD 05/27/12 [id number]

## 2012-05-28 ENCOUNTER — Encounter (HOSPITAL_COMMUNITY): Payer: Self-pay | Admitting: *Deleted

## 2012-05-28 DIAGNOSIS — E872 Acidosis: Secondary | ICD-10-CM

## 2012-05-28 DIAGNOSIS — I959 Hypotension, unspecified: Principal | ICD-10-CM

## 2012-05-28 LAB — TROPONIN I
Troponin I: <0.3 ng/mL (ref <0.30)
Troponin I: <0.3 ng/mL (ref <0.30)

## 2012-05-28 LAB — GLUCOSE, CAPILLARY
Glucose-Capillary: 286 mg/dL — ABNORMAL HIGH (ref 70–99)
Glucose-Capillary: 310 mg/dL — ABNORMAL HIGH (ref 70–99)
Glucose-Capillary: 317 mg/dL — ABNORMAL HIGH (ref 70–99)

## 2012-05-28 LAB — CBC
MCV: 90.1 fL (ref 78.0–100.0)
Platelets: 247 10*3/uL (ref 150–400)
RBC: 4.03 MIL/uL (ref 3.87–5.11)
RDW: 14 % (ref 11.5–15.5)
WBC: 10.3 10*3/uL (ref 4.0–10.5)

## 2012-05-28 LAB — BASIC METABOLIC PANEL
CO2: 24 mEq/L (ref 19–32)
Chloride: 102 mEq/L (ref 96–112)
GFR calc Af Amer: 79 mL/min — ABNORMAL LOW (ref >90)
Potassium: 4.1 mEq/L (ref 3.5–5.1)
Sodium: 138 mEq/L (ref 135–145)

## 2012-05-28 LAB — TSH: TSH: 0.232 u[IU]/mL — ABNORMAL LOW (ref 0.350–4.500)

## 2012-05-28 MED ORDER — INSULIN ASPART 100 UNIT/ML ~~LOC~~ SOLN
0.0000 [IU] | Freq: Three times a day (TID) | SUBCUTANEOUS | Status: DC
  Administered 2012-05-28: 11 [IU] via SUBCUTANEOUS
  Administered 2012-05-28: 8 [IU] via SUBCUTANEOUS
  Administered 2012-05-28: 11 [IU] via SUBCUTANEOUS
  Administered 2012-05-29: 15 [IU] via SUBCUTANEOUS
  Administered 2012-05-29 (×2): 5 [IU] via SUBCUTANEOUS
  Administered 2012-05-30: 11 [IU] via SUBCUTANEOUS
  Administered 2012-05-30: 8 [IU] via SUBCUTANEOUS

## 2012-05-28 MED ORDER — INSULIN GLARGINE 100 UNIT/ML ~~LOC~~ SOLN
40.0000 [IU] | Freq: Every day | SUBCUTANEOUS | Status: DC
  Administered 2012-05-28: 40 [IU] via SUBCUTANEOUS
  Filled 2012-05-28 (×4): qty 0.4

## 2012-05-28 MED ORDER — INSULIN ASPART 100 UNIT/ML ~~LOC~~ SOLN
0.0000 [IU] | Freq: Every day | SUBCUTANEOUS | Status: DC
  Administered 2012-05-28 – 2012-05-29 (×2): 3 [IU] via SUBCUTANEOUS

## 2012-05-28 MED ORDER — INSULIN GLARGINE 100 UNIT/ML ~~LOC~~ SOLN
70.0000 [IU] | Freq: Every day | SUBCUTANEOUS | Status: DC
  Administered 2012-05-29 – 2012-05-30 (×2): 70 [IU] via SUBCUTANEOUS
  Filled 2012-05-28 (×3): qty 0.7

## 2012-05-28 NOTE — Progress Notes (Signed)
Patient admitted earlier this morning by Dr. Phillips Odor  Patient seen and examined, database reviewed.  Patient was recently at [facility] and seen for hypotension, sepsis due to urinary tract infection. She was discharged home on Ceftin. Urine culture that time did not show any significant growth. Patient was at home and was seen by home health physical therapy when she was noted to be hypotensive and sent to the emergency room for evaluation.  Patient was amenable hypotension and near syncope. Etiology hypertension is not entirely clear. Her cardiac markers are negative x3. Blood pressure is improved. She is on multiple blood pressure medications and there may be a question of compliance and appropriate administration of medications. We'll check cortisol and TSH. It does not appear septic/toxic. Lactic acid was elevated but this may be due to hyperperfusion during hypotension. She is also on metformin which could be contributing lactic acidosis. We will discontinue metformin for now.

## 2012-05-28 NOTE — Progress Notes (Addendum)
Inpatient Diabetes Program Recommendations  AACE/ADA: New Consensus Statement on Inpatient Glycemic Control (2013)  Target Ranges:  Prepandial:   less than 140 mg/dL      Peak postprandial:   less than 180 mg/dL (1-2 hours)      Critically ill patients:  140 - 180 mg/dL   Results for JAICEE, MICHELOTTI (MRN [id number]) as of 05/28/2012 09:18  Ref. Range 05/27/2012 15:49 05/28/2012 04:21  Glucose Latest Range: 70-99 mg/dL 409 (H) 811 (H)   Results for ZAREENA, WILLIS (MRN [id number]) as of 05/28/2012 09:18  Ref. Range 05/28/2012 08:12  Glucose-Capillary Latest Range: 70-99 mg/dL 213 (H)    Inpatient Diabetes Program Recommendations Insulin - Basal: Please consider increasing Lantus to 60 units daily.  Note: Initial lab glucose was 164 mg/dl on 0/8/65 @ 78:46 and fasting blood glucose this morning was 286 mg/dl.  Patient is currently ordered to receive Lantus 40 units daily, Novolog 0-15 units AC, and Novolog 0-5 units HS for inpatient glycemic control.  According to the home medication list in the chart, the patient takes Lantus 70 units QHS, Humalog 2-10 units TID, and Metformin 1000 mg BID at home for diabetes management.  Please consider increasing Lantus to 60 units daily and adjust accordingly.  Will continue to follow.  Thanks, Orlando Penner, RN, BSN, CCRN Diabetes Coordinator Inpatient Diabetes Program [phone]

## 2012-05-28 NOTE — Care Management Note (Unsigned)
    Page 1 of 1   05/28/2012     3:13:53 PM   CARE MANAGEMENT NOTE 05/28/2012  Patient:  Cathy [name], Cathy [name]   Account Number:  [id number]  Date Initiated:  05/28/2012  Documentation initiated by:  Sharrie Rothman  Subjective/Objective Assessment:   Pt admitted from home with hypotension, near syncope. Pt lives alone but has a son, grandson, and neighbors who are there to assist pt with care. Pt has a walker for home use. Pt active with [facility].     Action/Plan:   Will resume HH at discharge. Pt denies any other CM needs.   Anticipated DC Date:  05/29/2012   Anticipated DC Plan:  HOME W HOME HEALTH SERVICES      DC Planning Services  CM consult      Choice offered to / List presented to:             Status of service:  In process, will continue to follow Medicare Important Message given?   (If response is "NO", the following Medicare IM given date fields will be blank) Date Medicare IM given:   Date Additional Medicare IM given:    Discharge Disposition:    Per UR Regulation:    If discussed at Long Length of Stay Meetings, dates discussed:    Comments:  05/28/12 1515 Arlyss Queen, RN BSN CM

## 2012-05-28 NOTE — H&P (Addendum)
[facility] History and Physical  Cathy [name] ZOX:[id number] DOB: 1930/08/04 DOA: 05/27/2012  Referring physician: Etheleen Mayhew PCP: Milinda Antis, MD  Specialists: Dr. Arbie Cookey, [facility], and [facility]   Chief Complaint:  Hypotension, Near Syncope  HPI: Cathy [name] is a 79 y.o. female multiple chronic medical problems including congestive heart failure, coronary artery disease status post MI in April of 2014, critical carotid stenosis on the right, chronic kidney disease and diabetes who was sent to ED by a home health/PT provider who noted that she had very low blood pressure. This has been an ongoing problem-there is some question about how she is taking her medications at home and the question of low level vascular dementia affecting her medication adherence.   When the patient arrived in the emergency room her blood pressure was 80s over 30s, while sitting in triage she became pale and diaphoretic and acutely weak but did not lose consciousness-she describes the onset of extreme nausea associated with this event. She denies any chest pain or worsening shortness of breath.   Review of Systems: Review of Systems  Constitutional: Positive for malaise/fatigue and diaphoresis. Negative for fever, chills and weight loss.  HENT: Positive for hearing loss and congestion.   Eyes: Positive for blurred vision.  Respiratory: Negative for cough, sputum production, shortness of breath and wheezing.   Cardiovascular: Positive for orthopnea and leg swelling. Negative for chest pain and palpitations.  Genitourinary: Positive for urgency and frequency. Negative for dysuria.  Musculoskeletal: Positive for myalgias, back pain, joint pain and falls.  Skin: Negative for itching and rash.  Neurological: Positive for dizziness, weakness and headaches.  Endo/Heme/Allergies: Negative.   Psychiatric/Behavioral: Negative for depression and memory loss. The patient is nervous/anxious and has insomnia.       Past Medical History  Diagnosis Date  . Hypertension     Unspecified  . Hyperlipidemia     Mixed  . CAD (coronary artery disease) 08/21/09    a. Catheterization 2006 occluded OM vein graft b.   nuclear 2008 question slight ischemia c. anterior MI 04/15/12 s/p PTCA-LCx  . S/P CABG (coronary artery bypass graft) 2003     2003  LIMA, LAD, SVG to the OM, SVG right coronary artery  . Aneurysm 2003    Ascending thoracic aneurysm repair with a graft at time of CABG  . Diabetes mellitus     Insulin dependent  . Gastroparesis   . Depression   . Allergy history, radiographic dye     Contrast dye allergy  . Ejection fraction     EF 50%  //  Ejection fraction 25-30% at the time of stress echo,  May, 2013  . Previous back surgery   . Syncope 2008    Question?  2008  . Statin intolerance   . Shortness of breath     Exertional shortness of breath, May, 2013  . Systolic CHF     June, 2013  . Ischemic cardiomyopathy 06/2011    a. Echo 04/19/12: EF 25-30%, mod LVH, diffuse HK, trivial AI  . Thyroid disease   . CHF (congestive heart failure)   . Nausea     October, 2013  . Hypotension     October, 2013  . Hypothyroidism     Treated with low-dose Synthroid  . CKD (chronic kidney disease)   . Carotid artery disease     Stenosis greater than 80%, asymptomatic, April, 2014, followed carefully by Dr. Arbie Cookey with consideration for possible endarterectomy  when overall  medical status stabilizes   Past Surgical History  Procedure Laterality Date  . Coronary artery bypass graft  2003     LIMA, LAD, SVG to the OM, SVG right coronary artery  . Bladder tacking    . Salivary gland surgery    . Total abdominal hysterectomy    . Back surgery    . Breast surgery    . External ear surgery    . Cardiac catheterization  04/15/12    60-70% proximal LAD, 95% mid AV groove circumflex s/p PTCA, proximal RCA occlusion, patent LIMA to LAD, patent SVG to distal RCA, chronically occluded SVG to  circumflex; LVEF 25-30%   Social History:  reports that she has never smoked. She has never used smokeless tobacco. She reports that she does not drink alcohol or use illicit drugs. lives at home independently, she has a nephew who periodically lives with her but he is a Naval architect and not around 24 7 to assist her. He tells me she is able to fill her on pillboxes and knows how to take her medications as prescribed.   Allergies  Allergen Reactions  . Atorvastatin Other (See Comments)    Unknown-patient admitted to hospital after taking  . Citrus Dermatitis    BREAK OUT ON BODY  . Iodine   . Iohexol      Desc: ANAPHYLAXIS   . Zolpidem Tartrate     REACTION: hallucinations  . Milk-Related Compounds Rash    Family History  Problem Relation Age of Onset  . Heart disease Other   . Heart disease Mother   . Hypertension Mother   . Heart disease Father   . Hypertension Father    *  Prior to Admission medications   Medication Sig Start Date End Date Taking? Authorizing Provider  acetaminophen (NON-ASPIRIN) 325 MG tablet Take 650 mg by mouth every 6 (six) hours as needed for pain.   Yes Historical Provider, MD  aspirin EC 81 MG tablet Take 81 mg by mouth every morning. 04/28/12  Yes Prescott Parma, PA-C  carvedilol (COREG) 25 MG tablet Take 12.5 mg by mouth 2 (two) times daily with a meal. 12/09/11  Yes Luis Abed, MD  cephALEXin (KEFLEX) 500 MG capsule Take 1 capsule (500 mg total) by mouth 4 (four) times daily. 05/25/12  Yes Joseph Art, DO  clopidogrel (PLAVIX) 75 MG tablet Take 75 mg by mouth every morning. 05/12/12  Yes Luis Abed, MD  diphenhydrAMINE (SOMINEX) 25 MG tablet Take 25 mg by mouth at bedtime as needed for sleep.   Yes Historical Provider, MD  folic acid (FOLVITE) 1 MG tablet Take 1 mg by mouth every morning.    Yes Historical Provider, MD  furosemide (LASIX) 40 MG tablet Take 1 tablet (40 mg total) by mouth 2 (two) times daily. 05/25/12  Yes Joseph Art, DO   HYDROcodone-acetaminophen (NORCO/VICODIN) 5-325 MG per tablet Take 1 tablet by mouth every 6 (six) hours as needed for pain.   Yes Historical Provider, MD  insulin glargine (LANTUS) 100 UNIT/ML injection Inject 70 Units into the skin at bedtime. 05/25/12  Yes Jessica U Vann, DO  insulin lispro (HUMALOG) 100 UNIT/ML injection Inject 2-10 Units into the skin 3 (three) times daily before meals. INJECT 2 TO 10 UNITS SUBCUTANEOUSLY FOUR TIMES DAILY AS DIRECTED USING SLIDING SCALE.   Yes Historical Provider, MD  isosorbide mononitrate (IMDUR) 30 MG 24 hr tablet Take 30 mg by mouth every morning.  09/10/11  Yes Tinnie Gens  Sedonia Small, MD  levothyroxine (SYNTHROID, LEVOTHROID) 112 MCG tablet Take 56 mcg by mouth every morning.    Yes Historical Provider, MD  meclizine (ANTIVERT) 25 MG tablet Take 25 mg by mouth 3 (three) times daily as needed for dizziness.   Yes Historical Provider, MD  metFORMIN (GLUCOPHAGE) 500 MG tablet Take 1,000 mg by mouth 2 (two) times daily with a meal.   Yes Historical Provider, MD  nitroGLYCERIN (NITROSTAT) 0.4 MG SL tablet Place 0.4 mg under the tongue every 5 (five) minutes as needed for chest pain. May repeat times three.   Yes Historical Provider, MD  omega-3 acid ethyl esters (LOVAZA) 1 G capsule Take 2 capsules (2 g total) by mouth 2 (two) times daily. 03/19/12  Yes Salley Scarlet, MD  promethazine (PHENERGAN) 25 MG tablet Take 25 mg by mouth every 6 (six) hours as needed for nausea.   Yes Historical Provider, MD  ramipril (ALTACE) 5 MG capsule Take 5 mg by mouth every morning. 03/11/12  Yes Salley Scarlet, MD  fluticasone [facility]) 50 MCG/ACT nasal spray Place 1 spray into the nose daily as needed. For allergies 08/21/11 08/20/12  Salley Scarlet, MD   Physical Exam: Filed Vitals:   05/27/12 1858 05/27/12 1915 05/27/12 2000 05/28/12 0457  BP: 102/47 102/50 106/51 136/48  Pulse: 71 70 73 67  Temp: 98.4 F (36.9 C)   98 F (36.7 C)  TempSrc: Oral   Oral  Resp: 18 20 19 18    Height:      Weight:      SpO2: 96% 96% 97% 96%   General: Well-developed and nourished. Alert and oriented, pleasant and talkative Eyes: Anicteric no pallor.  ENT: No discharge from the ears eyes nose and mouth.  Neck: Normal  Cardiovascular: S1-S2 heard.  Respiratory: No rhonchi or crepitations.  Abdomen: Soft nontender bowel sounds present.  Skin: No rash.  Musculoskeletal: 1+ lower extremity edema.  Psychiatric: Appears normal.  Neurologic: Alert awake oriented to time place and person. Moves all extremities.   Labs on Admission:  Basic Metabolic Panel:  Recent Labs Lab 05/22/12 0620 05/23/12 0046 05/24/12 0550 05/27/12 1549  NA 140 135 139 141  K 4.1 4.0 4.2 4.3  CL 106 100 100 104  CO2 25 23 27 25   GLUCOSE 227* 354* 289* 164*  BUN 13 16 13 16   CREATININE 0.64 0.71 0.64 0.88  CALCIUM 9.3 8.9 9.3 10.5   L Recent Labs Lab 05/22/12 0620 05/23/12 0046 05/24/12 0550 05/27/12 1549  WBC 8.6 10.4 10.5 9.9  NEUTROABS  --   --   --  4.6  HGB 11.9* 12.3 13.3 12.5  HCT 35.7* 36.9 40.2 37.2  MCV 88.4 87.2 88.5 89.0  PLT 202 208 232 243   Cardiac Enzymes:  Recent Labs Lab 05/23/12 0046 05/23/12 0710 05/23/12 1811 05/27/12 1549 05/27/12 2230  TROPONINI <0.30 <0.30 <0.30 <0.30 <0.30    BNP (last 3 results)  Recent Labs  10/24/11 1621  PROBNP 685.8*   CBG:  Recent Labs Lab 05/24/12 1131 05/24/12 1701 05/24/12 2127 05/25/12 0758 05/25/12 1150  GLUCAP 309* 323* 279* 286* 262*    Radiological Exams on Admission: Dg Chest Portable 1 View  05/27/2012  *RADIOLOGY REPORT*  Clinical Data: Loss of consciousness.  PORTABLE CHEST - 1 VIEW  Comparison: 05/20/2012  Findings: The patient is status post median sternotomy CABG procedure.  There is a moderate cardiac enlargement.  There is no pleural effusion or edema identified.  No airspace  consolidation noted.  Advanced changes of osteoarthritis involve both glenohumeral joints.  IMPRESSION:  1.  No acute  findings.   Original Report Authenticated By: Signa Kell, M.D.     EKG: Independently reviewed. NSR.  Assessment/Plan Principal Problem:   Hypotension Active Problems:   Hyperlipidemia   S/P CABG (coronary artery bypass graft)   UTI (urinary tract infection)   Pre-syncope   CKD (chronic kidney disease)   Carotid artery disease   CAD (coronary artery disease)   Chronic systolic CHF (congestive heart failure)   Elevated lactic acid level   Polypharmacy   1. Hypotension, multifactorial- recent MI concerning, but she is also on multiple anti-hypetensives which have been titrated up fairly quickly. She is orthostatic. With her carotid stenosis she is going to be prone for near syncope/syncope with aggressive BP regimens that could lead to low pressure and reduced cerebral perfusion. Additionally she has chronic pain issues, so polypharmacy is always a concern.   Held/Discontinued her home BP medications. Will reduce her dose of Coreg, and resume meds under close supervision.    All other medications continued.  Code Status: Full Code  Family Communication: Discussed with Patient at bedside.  Disposition Plan: Home when medically stable, she would greatly benefit from a THN/CHF care management team and someone to assist with medication oversight.  Time spent: 70 minutes  [facility] Pager [phone]  If 7PM-7AM, please contact night-coverage [email or web address] Password [facility] 05/28/2012, 5:28 AM

## 2012-05-28 NOTE — Progress Notes (Signed)
Patient blood glucose elevated. Dr Kerry Hough notified.

## 2012-05-28 NOTE — Progress Notes (Signed)
UR chart review completed.  

## 2012-05-29 ENCOUNTER — Inpatient Hospital Stay (HOSPITAL_COMMUNITY): Payer: Medicare Other

## 2012-05-29 DIAGNOSIS — E039 Hypothyroidism, unspecified: Secondary | ICD-10-CM

## 2012-05-29 DIAGNOSIS — I509 Heart failure, unspecified: Secondary | ICD-10-CM

## 2012-05-29 DIAGNOSIS — E872 Acidosis: Secondary | ICD-10-CM

## 2012-05-29 DIAGNOSIS — I5022 Chronic systolic (congestive) heart failure: Secondary | ICD-10-CM

## 2012-05-29 LAB — CBC
MCH: 30.3 pg (ref 26.0–34.0)
MCHC: 33.7 g/dL (ref 30.0–36.0)
MCV: 89.8 fL (ref 78.0–100.0)
Platelets: 230 10*3/uL (ref 150–400)
RDW: 13.7 % (ref 11.5–15.5)

## 2012-05-29 LAB — GLUCOSE, CAPILLARY
Glucose-Capillary: 246 mg/dL — ABNORMAL HIGH (ref 70–99)
Glucose-Capillary: 367 mg/dL — ABNORMAL HIGH (ref 70–99)
Glucose-Capillary: 229 mg/dL — ABNORMAL HIGH (ref 70–99)

## 2012-05-29 LAB — BASIC METABOLIC PANEL
Calcium: 9.6 mg/dL (ref 8.4–10.5)
Creatinine, Ser: 0.69 mg/dL (ref 0.50–1.10)
GFR calc Af Amer: >90 mL/min (ref >90)
GFR calc non Af Amer: 80 mL/min — ABNORMAL LOW (ref >90)

## 2012-05-29 MED ORDER — RAMIPRIL 10 MG PO CAPS
10.0000 mg | ORAL_CAPSULE | Freq: Every day | ORAL | Status: DC
  Administered 2012-05-29 – 2012-05-30 (×2): 10 mg via ORAL
  Filled 2012-05-29 (×2): qty 1

## 2012-05-29 MED ORDER — INSULIN ASPART 100 UNIT/ML ~~LOC~~ SOLN
5.0000 [IU] | Freq: Three times a day (TID) | SUBCUTANEOUS | Status: DC
  Administered 2012-05-30 (×2): 5 [IU] via SUBCUTANEOUS

## 2012-05-29 NOTE — Progress Notes (Signed)
[facility] PROGRESS NOTE  Cathy [name] WUJ:[id number] DOB: [birth year] DOA: 05/27/2012 PCP: Milinda Antis, MD  Assessment/Plan: 1. Hypotension. Unclear etiology. Possibly due to improper administration of medications. Initially when patient was admitted she was hypotensive. Her antihypertensives including ramipril and carvedilol were held on admission. Since that time her blood pressures trended up and she is currently become hypertensive. TSH is to be mildly low in cortisol is currently pending. She's been adequately rehydrated. We will resume ACE inhibitor at this point. 2. Left neck erythema, possibly cellulitis. Will check a CT of the neck. She is not febrile and does not have an elevated WBC count. We'll continue to monitor. 3. Chronic systolic congestive heart failure. Appears compensated at this time. 4. Diabetes. We will continue Lantus and had no coverage. Hold metformin since patient was admitted with lactic acidosis 5. Hypothyroidism. Continue levothyroxine.  Code Status: Full code Family Communication: Discussed with patient Disposition Plan: Discharge home when improved, possible discharge tomorrow.   Consultants:  None  Procedures:  None  Antibiotics:  None  HPI/Subjective: Patient does not feel well today. Complains of pain in her legs. She is also complaining of swelling and tenderness under her left ear.  Objective: Filed Vitals:   05/29/12 0500 05/29/12 0542 05/29/12 0716 05/29/12 1400  BP:  138/74  156/60  Pulse:  74  78  Temp:  98.3 F (36.8 C)  98.1 F (36.7 C)  TempSrc:  Oral  Oral  Resp:  20  20  Height:      Weight: 85.7 kg (188 lb 15 oz)     SpO2:  96% 96% 92%    Intake/Output Summary (Last 24 hours) at 05/29/12 1806 Last data filed at 05/29/12 1752  Gross per 24 hour  Intake 1594.17 ml  Output      0 ml  Net 1594.17 ml   Filed Weights   05/27/12 1547 05/28/12 0500 05/29/12 0500  Weight: 82.101 kg (181 lb) 85.9 kg (189 lb 6 oz)  85.7 kg (188 lb 15 oz)    Exam:   General:  NAD  Cardiovascular: s1, s2, rrr  Respiratory: crackles at bases  Abdomen: soft, nt, nd, bs+  Musculoskeletal: no pedal edema.   Skin: Area of redness, warmth under her left ear over her neck.   Data Reviewed: Basic Metabolic Panel:  Recent Labs Lab 05/23/12 0046 05/24/12 0550 05/27/12 1549 05/28/12 0421 05/29/12 0549  NA 135 139 141 138 140  K 4.0 4.2 4.3 4.1 4.9  CL 100 100 104 102 104  CO2 23 27 25 24 28   GLUCOSE 354* 289* 164* 266* 276*  BUN 16 13 16 17 11   CREATININE 0.71 0.64 0.88 0.80 0.69  CALCIUM 8.9 9.3 10.5 9.5 9.6   Liver Function Tests: No results found for this basename: AST, ALT, ALKPHOS, BILITOT, PROT, ALBUMIN,  in the last 168 hours No results found for this basename: LIPASE, AMYLASE,  in the last 168 hours No results found for this basename: AMMONIA,  in the last 168 hours CBC:  Recent Labs Lab 05/23/12 0046 05/24/12 0550 05/27/12 1549 05/28/12 0421 05/29/12 0549  WBC 10.4 10.5 9.9 10.3 10.2  NEUTROABS  --   --  4.6  --   --   HGB 12.3 13.3 12.5 12.0 12.8  HCT 36.9 40.2 37.2 36.3 38.0  MCV 87.2 88.5 89.0 90.1 89.8  PLT 208 232 243 247 230   Cardiac Enzymes:  Recent Labs Lab 05/23/12 1811 05/27/12 1549 05/27/12 2230 05/28/12  0421 05/28/12 1009  TROPONINI <0.30 <0.30 <0.30 <0.30 <0.30   BNP (last 3 results)  Recent Labs  10/24/11 1621  PROBNP 685.8*   CBG:  Recent Labs Lab 05/28/12 1620 05/28/12 2117 05/29/12 0714 05/29/12 1146 05/29/12 1641  GLUCAP 317* 277* 246* 367* 229*    Recent Results (from the past 240 hour(s))  URINE CULTURE     Status: None   Collection Time    05/20/12  6:01 PM      Result Value Range Status   Specimen Description URINE, RANDOM   Final   Special Requests NONE   Final   Culture  Setup Time 05/20/2012 18:58   Final   Colony Count >=100,000 COLONIES/ML   Final   Culture     Final   Value: Multiple bacterial morphotypes present, none  predominant. Suggest appropriate recollection if clinically indicated.   Report Status 05/21/2012 FINAL   Final  CULTURE, BLOOD (ROUTINE X 2)     Status: None   Collection Time    05/20/12  6:58 PM      Result Value Range Status   Specimen Description BLOOD ARM RIGHT   Final   Special Requests BOTTLES DRAWN AEROBIC AND ANAEROBIC 10CC   Final   Culture  Setup Time 05/21/2012 02:23   Final   Culture NO GROWTH 5 DAYS   Final   Report Status 05/27/2012 FINAL   Final  CULTURE, BLOOD (ROUTINE X 2)     Status: None   Collection Time    05/20/12  7:07 PM      Result Value Range Status   Specimen Description BLOOD HAND RIGHT   Final   Special Requests BOTTLES DRAWN AEROBIC ONLY 1CC   Final   Culture  Setup Time 05/21/2012 02:22   Final   Culture NO GROWTH 5 DAYS   Final   Report Status 05/27/2012 FINAL   Final  MRSA PCR SCREENING     Status: None   Collection Time    05/21/12 12:55 AM      Result Value Range Status   MRSA by PCR NEGATIVE  NEGATIVE Final   Comment:            The GeneXpert MRSA Assay (FDA     approved for NASAL specimens     only), is one component of a     comprehensive MRSA colonization     surveillance program. It is not     intended to diagnose MRSA     infection nor to guide or     monitor treatment for     MRSA infections.     Studies: No results found.  Scheduled Meds: . aspirin EC  81 mg Oral q morning - 10a  . clopidogrel  75 mg Oral Q breakfast  . docusate sodium  100 mg Oral BID  . insulin aspart  0-15 Units Subcutaneous TID WC  . insulin aspart  0-5 Units Subcutaneous QHS  . [START ON 05/30/2012] insulin aspart  5 Units Subcutaneous TID WC  . insulin glargine  70 Units Subcutaneous Daily  . levothyroxine  56 mcg Oral QAC breakfast  . omega-3 acid ethyl esters  2 g Oral BID  . ramipril  10 mg Oral Daily  . sodium chloride  3 mL Intravenous Q12H   Continuous Infusions:   Principal Problem:   Hypotension Active Problems:   Hyperlipidemia   S/P  CABG (coronary artery bypass graft)   UTI (urinary tract infection)   Pre-syncope  CKD (chronic kidney disease)   Carotid artery disease   CAD (coronary artery disease)   Chronic systolic CHF (congestive heart failure)   Elevated lactic acid level   Polypharmacy    Time spent:    [facility]  [facility] Pager [phone]. If 7PM-7AM, please contact night-coverage at [email or web address], password [facility] 05/29/2012, 6:06 PM  LOS: 2 days

## 2012-05-29 NOTE — Progress Notes (Signed)
Patient CBG 367, 15 units of Novolog given as ordered. Dr. Kerry Hough notified.

## 2012-05-30 DIAGNOSIS — E119 Type 2 diabetes mellitus without complications: Secondary | ICD-10-CM

## 2012-05-30 LAB — GLUCOSE, CAPILLARY

## 2012-05-30 LAB — CORTISOL-AM, BLOOD: Cortisol - AM: 4.2 ug/dL — ABNORMAL LOW (ref 4.3–22.4)

## 2012-05-30 MED ORDER — CARVEDILOL 6.25 MG PO TABS: 6.2500 mg | ORAL_TABLET | Freq: Two times a day (BID) | ORAL | Status: DC

## 2012-05-30 MED ORDER — FUROSEMIDE 40 MG PO TABS: 40.0000 mg | ORAL_TABLET | Freq: Every day | ORAL | Status: DC

## 2012-05-30 NOTE — Progress Notes (Addendum)
Patient with orders to be discharge home. Discharge instructions given, patient verbalized understanding. Patient in stable condition upon discharge. Patient left with family in private vehicle.  

## 2012-05-30 NOTE — Discharge Summary (Signed)
Physician Discharge Summary  CIJI BOSTON ZOX:[id number] DOB: [birth year] DOA: 05/27/2012  PCP: Milinda Antis, MD  Admit date: 05/27/2012 Discharge date: 05/30/2012  Time spent: 35 minutes  Recommendations for Outpatient Follow-up:  1. Follow up with primary care doctor on Thursday as scheduled  Discharge Diagnoses:  Principal Problem:   Hypotension Active Problems:   Hyperlipidemia   S/P CABG (coronary artery bypass graft)   UTI (urinary tract infection)   Pre-syncope   CKD (chronic kidney disease)   Carotid artery disease   CAD (coronary artery disease)   Chronic systolic CHF (congestive heart failure)   Elevated lactic acid level   Polypharmacy   Discharge Condition: improved  Diet recommendation: low salt, low carb  Filed Weights   05/28/12 0500 05/29/12 0500 05/30/12 0500  Weight: 85.9 kg (189 lb 6 oz) 85.7 kg (188 lb 15 oz) 85.2 kg (187 lb 13.3 oz)    History of present illness:  Cathy [name] is a 79 y.o. female multiple chronic medical problems including congestive heart failure, coronary artery disease status post MI in April of 2014, critical carotid stenosis on the right, chronic kidney disease and diabetes who was sent to ED by a home health/PT provider who noted that she had very low blood pressure. This has been an ongoing problem-there is some question about how she is taking her medications at home and the question of low level vascular dementia affecting her medication adherence.  When the patient arrived in the emergency room her blood pressure was 80s over 30s, while sitting in triage she became pale and diaphoretic and acutely weak but did not lose consciousness-she describes the onset of extreme nausea associated with this event. She denies any chest pain or worsening shortness of breath.   Hospital Course:  This lady was admitted to the hospital with hypotension and pre syncopal episodes. She was monitored on telemetry and did not have any evidence of  arrhythmias. Cardiac markers were found to be negative. She did not have any clear source of infection.  TSH was found to be mildly suppressed and AM cortisol was found to be just below the lower range of normal. She was given IV fluids and her antihypertensives were held.  Her blood pressure improved and she did not have any further episodes of hypotension.  Through her hospital course, her ramipril and coreg were restarted at reduced doses and blood pressure tolerated this.  These will need to be further titrated as an outpatient.  She did have evidence of lactic acidosis on admission, which was likely due to hypotension and hypoperfusion.  Her metformin was held in case this may be contributing to lactic acidosis. There may be a component of improper medication administration.  It is not clear if the patient is taking extra doses of blood pressure meds. We have set her up with home health nursing to further address medication compliance. Outpatient endocrinology consult may be considered to further work up low TSH and mildly low cortisol.  She has maintained stability in the hospital and is felt safe to discharge home.   Procedures:  none  Consultations:  none  Discharge Exam: Filed Vitals:   05/29/12 2203 05/30/12 0500 05/30/12 0540 05/30/12 1400  BP: 170/79  158/79 151/55  Pulse: 80  82 79  Temp: 98.8 F (37.1 C)  98.4 F (36.9 C) 98 F (36.7 C)  TempSrc:    Oral  Resp: 20  20 20   Height:      Weight:  85.2 kg (187 lb 13.3 oz)    SpO2: 94%  93% 93%    General: NAD Cardiovascular: s1, s2, rrr Respiratory: CTA B  Discharge Instructions  Discharge Orders   Future Appointments Provider Department Dept Phone   06/03/2012 3:00 PM Salley Scarlet, MD [facility] [phone]   06/09/2012 1:00 PM Prescott Parma, PA-C [facility] [place] (near [place]) [phone]   Future Orders Complete By Expires     (HEART FAILURE PATIENTS) Call MD:  Anytime you have any of the  following symptoms: 1) 3 pound weight gain in 24 hours or 5 pounds in 1 week 2) shortness of breath, with or without a dry hacking cough 3) swelling in the hands, feet or stomach 4) if you have to sleep on extra pillows at night in order to breathe.  As directed     Call MD for:  extreme fatigue  As directed     Call MD for:  persistant dizziness or light-headedness  As directed     Diet - low sodium heart healthy  As directed     Increase activity slowly  As directed         Medication List    STOP taking these medications       cetirizine 10 MG tablet  Commonly known as:  ZYRTEC     metFORMIN 500 MG tablet  Commonly known as:  GLUCOPHAGE      TAKE these medications       aspirin EC 81 MG tablet  Take 81 mg by mouth every morning.     carvedilol 6.25 MG tablet  Commonly known as:  COREG  Take 1 tablet (6.25 mg total) by mouth 2 (two) times daily with a meal.     cephALEXin 500 MG capsule  Commonly known as:  KEFLEX  Take 1 capsule (500 mg total) by mouth 4 (four) times daily.     clopidogrel 75 MG tablet  Commonly known as:  PLAVIX  Take 75 mg by mouth every morning.     diphenhydrAMINE 25 MG tablet  Commonly known as:  SOMINEX  Take 25 mg by mouth at bedtime as needed for sleep.     fluticasone 50 MCG/ACT nasal spray  Commonly known as:  FLONASE  Place 1 spray into the nose daily as needed. For allergies     folic acid 1 MG tablet  Commonly known as:  FOLVITE  Take 1 mg by mouth every morning.     furosemide 40 MG tablet  Commonly known as:  LASIX  Take 1 tablet (40 mg total) by mouth daily.     HYDROcodone-acetaminophen 5-325 MG per tablet  Commonly known as:  NORCO/VICODIN  Take 1 tablet by mouth every 6 (six) hours as needed for pain.     insulin glargine 100 UNIT/ML injection  Commonly known as:  LANTUS  Inject 70 Units into the skin at bedtime.     insulin lispro 100 UNIT/ML injection  Commonly known as:  HUMALOG  Inject 2-10 Units into the skin 3  (three) times daily before meals. INJECT 2 TO 10 UNITS SUBCUTANEOUSLY FOUR TIMES DAILY AS DIRECTED USING SLIDING SCALE.     isosorbide mononitrate 30 MG 24 hr tablet  Commonly known as:  IMDUR  Take 30 mg by mouth every morning.     levothyroxine 112 MCG tablet  Commonly known as:  SYNTHROID, LEVOTHROID  Take 56 mcg by mouth every morning.     meclizine 25 MG tablet  Commonly known as:  ANTIVERT  Take 25 mg by mouth 3 (three) times daily as needed for dizziness.     NITROSTAT 0.4 MG SL tablet  Generic drug:  nitroGLYCERIN  Place 0.4 mg under the tongue every 5 (five) minutes as needed for chest pain. May repeat times three.     NON-ASPIRIN 325 MG tablet  Generic drug:  acetaminophen  Take 650 mg by mouth every 6 (six) hours as needed for pain.     omega-3 acid ethyl esters 1 G capsule  Commonly known as:  LOVAZA  Take 2 capsules (2 g total) by mouth 2 (two) times daily.     promethazine 25 MG tablet  Commonly known as:  PHENERGAN  Take 25 mg by mouth every 6 (six) hours as needed for nausea.     ramipril 5 MG capsule  Commonly known as:  ALTACE  Take 5 mg by mouth every morning.       Allergies  Allergen Reactions  . Atorvastatin Other (See Comments)    Unknown-patient admitted to hospital after taking  . Citrus Dermatitis    BREAK OUT ON BODY  . Iodine   . Iohexol      Desc: ANAPHYLAXIS   . Zolpidem Tartrate     REACTION: hallucinations  . Milk-Related Compounds Rash       Follow-up Information   Follow up with [facility].   Contact information:   [address] [place] [address] [phone]      Follow up with Milinda Antis, MD. (on thursday as scheduled)    Contact information:   [address], [address] [place] [address] [phone]        The results of significant diagnostics from this hospitalization (including imaging, microbiology, ancillary and laboratory) are listed below for reference.    Significant Diagnostic  Studies: Dg Chest 2 View  05/20/2012  *RADIOLOGY REPORT*  Clinical Data: Chest pain.  Shortness of breath.  Nausea. Hypotension.  Myocardial infarction 1 month ago.  CHEST - 2 VIEW  Comparison: 04/16/2012  Findings: Prior CABG noted.  Borderline cardiomegaly.  No pulmonary edema noted.  The lungs appear grossly clear.  Reverse lordotic positioning employed on the frontal projection.  No definite pleural effusion observed although the left posterior costophrenic angle is clipped.  IMPRESSION:  1.  Borderline cardiomegaly.  No edema or specific additional acute radiographic abnormalities.   Original Report Authenticated By: Gaylyn Rong, M.D.    Ct Soft Tissue Neck Wo Contrast  05/29/2012  *RADIOLOGY REPORT*  Clinical Data: Swelling and redness of the left neck below the ear.  CT NECK WITHOUT CONTRAST  Technique:  Multidetector CT imaging of the neck was performed without intravenous contrast.  Comparison: MRI brain 03/29/2012.  CT head without contrast 09/20/2011.  Findings: No focal subcutaneous lesion is evident.  The parotid glands are within normal limits bilaterally.  The left submandibular gland is smaller than the right.  No focal lesions or duct dilation is evident.  Bilateral level I level II sub centimeter lymph nodes are present. No pathologically enlarged nodes are present.  No focal mucosal or submucosal lesions are present.  Vocal cords are midline and symmetric.  The thyroid is heterogeneous with enlargement of the left lobe extending inferiorly.  No dominant nodule is evident. Atherosclerotic calcifications are present at the aortic arch and carotid bifurcations.  The patient is status post median sternotomy.  Limited imaging the brain is unremarkable.  The bone windows demonstrate  to moderate to spondylosis with significant loss of disc height at C5-6 and C6-7.  Degenerative anterolisthesis is present at C4-5.  IMPRESSION:  1.  No acute or focal lesion explain redness of the left neck. This  may represent a superficial cellulitis. 2.  No discrete mass lesion. 3.  Moderate spondylosis of the cervical spine as described. 4.  Heterogeneous appearance of the thyroid with enlargement of the left lobe.  No dominant nodule is evident. 5.  Decreased size of the left submandibular gland likely represents chronic atrophy.   Original Report Authenticated By: Marin [name], M.D.    Dg Chest Portable 1 View  05/27/2012  *RADIOLOGY REPORT*  Clinical Data: Loss of consciousness.  PORTABLE CHEST - 1 VIEW  Comparison: 05/20/2012  Findings: The patient is status post median sternotomy CABG procedure.  There is a moderate cardiac enlargement.  There is no pleural effusion or edema identified.  No airspace consolidation noted.  Advanced changes of osteoarthritis involve both glenohumeral joints.  IMPRESSION:  1.  No acute findings.   Original Report Authenticated By: Signa [place], M.D.     Microbiology: Recent Results (from the past 240 hour(s))  MRSA PCR SCREENING     Status: None   Collection Time    05/21/12 12:55 AM      Result Value Range Status   MRSA by PCR NEGATIVE  NEGATIVE Final   Comment:            The GeneXpert MRSA Assay (FDA     approved for NASAL specimens     only), is one component of a     comprehensive MRSA colonization     surveillance program. It is not     intended to diagnose MRSA     infection nor to guide or     monitor treatment for     MRSA infections.     Labs: Basic Metabolic Panel:  Recent Labs Lab 05/24/12 0550 05/27/12 1549 05/28/12 0421 05/29/12 0549  NA 139 141 138 140  K 4.2 4.3 4.1 4.9  CL 100 104 102 104  CO2 27 25 24 28   GLUCOSE 289* 164* 266* 276*  BUN 13 16 17 11   CREATININE 0.64 0.88 0.80 0.69  CALCIUM 9.3 10.5 9.5 9.6   Liver Function Tests: No results found for this basename: AST, ALT, ALKPHOS, BILITOT, PROT, ALBUMIN,  in the last 168 hours No results found for this basename: LIPASE, AMYLASE,  in the last 168 hours No results  found for this basename: AMMONIA,  in the last 168 hours CBC:  Recent Labs Lab 05/24/12 0550 05/27/12 1549 05/28/12 0421 05/29/12 0549  WBC 10.5 9.9 10.3 10.2  NEUTROABS  --  4.6  --   --   HGB 13.3 12.5 12.0 12.8  HCT 40.2 37.2 36.3 38.0  MCV 88.5 89.0 90.1 89.8  PLT 232 243 247 230   Cardiac Enzymes:  Recent Labs Lab 05/27/12 1549 05/27/12 2230 05/28/12 0421 05/28/12 1009  TROPONINI <0.30 <0.30 <0.30 <0.30   BNP: BNP (last 3 results)  Recent Labs  10/24/11 1621  PROBNP 685.8*   CBG:  Recent Labs Lab 05/29/12 1146 05/29/12 1641 05/29/12 2039 05/30/12 0719 05/30/12 1109  GLUCAP 367* 229* 262* 271* 322*       Signed:  Graycen Sadlon  [facility] 05/30/2012, 11:59 PM

## 2012-05-31 ENCOUNTER — Telehealth: Payer: Self-pay | Admitting: Family Medicine

## 2012-05-31 NOTE — Telephone Encounter (Signed)
Patient's home health physical therapist called states that her blood pressure was running low. ( Nurse was on phone giving me details) She was feeling a little dizzy. This morning her blood pressure was 150 systolic she cannot remember the diastolic and she took with her Coreg and her Ramaprill. She got up late and did not eat breakfast. She took 11 units of insulin with her lunch which was too hot dogs. Her current blood sugar 309. Her current blood pressure is 80s over 60. She does not have any chest pain or shortness of breath but feels weak. She declines going back to the emergency room. Her nephew is currently sitting with her. I advised her not to take her second dose of carvedilol. She's to take another 5 units of Humalog. She will take her Lantus 70 units at bedtime. A followup appointment has been scheduled for tomorrow. She is unable to come this afternoon. She was given red flags.  It is noted that her cortisol was a little during her recent hospitalization. She's advised not to take her metformin

## 2012-06-01 ENCOUNTER — Encounter: Payer: Self-pay | Admitting: Family Medicine

## 2012-06-01 ENCOUNTER — Ambulatory Visit (INDEPENDENT_AMBULATORY_CARE_PROVIDER_SITE_OTHER): Payer: Medicare Other | Admitting: Family Medicine

## 2012-06-01 VITALS — BP 118/60 | HR 86 | Resp 16 | Wt 188.8 lb

## 2012-06-01 DIAGNOSIS — E2740 Unspecified adrenocortical insufficiency: Secondary | ICD-10-CM

## 2012-06-01 DIAGNOSIS — E119 Type 2 diabetes mellitus without complications: Secondary | ICD-10-CM

## 2012-06-01 DIAGNOSIS — R42 Dizziness and giddiness: Secondary | ICD-10-CM

## 2012-06-01 DIAGNOSIS — N390 Urinary tract infection, site not specified: Secondary | ICD-10-CM

## 2012-06-01 DIAGNOSIS — E1140 Type 2 diabetes mellitus with diabetic neuropathy, unspecified: Secondary | ICD-10-CM

## 2012-06-01 DIAGNOSIS — E2749 Other adrenocortical insufficiency: Secondary | ICD-10-CM

## 2012-06-01 DIAGNOSIS — E1149 Type 2 diabetes mellitus with other diabetic neurological complication: Secondary | ICD-10-CM

## 2012-06-01 DIAGNOSIS — I959 Hypotension, unspecified: Secondary | ICD-10-CM

## 2012-06-01 DIAGNOSIS — R7989 Other specified abnormal findings of blood chemistry: Secondary | ICD-10-CM

## 2012-06-01 DIAGNOSIS — E039 Hypothyroidism, unspecified: Secondary | ICD-10-CM

## 2012-06-01 DIAGNOSIS — I10 Essential (primary) hypertension: Secondary | ICD-10-CM

## 2012-06-01 DIAGNOSIS — E1142 Type 2 diabetes mellitus with diabetic polyneuropathy: Secondary | ICD-10-CM

## 2012-06-01 MED ORDER — LEVOTHYROXINE SODIUM 25 MCG PO TABS: 25.0000 ug | ORAL_TABLET | Freq: Every day | ORAL | Status: DC

## 2012-06-01 NOTE — Patient Instructions (Addendum)
Increase Lantus 40 units twice a day Continue your sliding scale with breakfast, lunch and dinner No Gabapentin/Neurontin  For your blood pressure- take only Coreg 6.25mg  twice a day  And the ramipril 5mg  Referral to endocrinologist Get the labs done today F/U 4 weeks [employer]

## 2012-06-02 LAB — CBC WITH DIFFERENTIAL/PLATELET
Basophils Absolute: 0.1 10*3/uL (ref 0.0–0.1)
Basophils Relative: 0 % (ref 0–1)
Eosinophils Absolute: 0.2 10*3/uL (ref 0.0–0.7)
MCH: 29 pg (ref 26.0–34.0)
MCHC: 33.6 g/dL (ref 30.0–36.0)
Neutrophils Relative %: 44 % (ref 43–77)
Platelets: 264 10*3/uL (ref 150–400)
RDW: 14.7 % (ref 11.5–15.5)

## 2012-06-02 LAB — COMPREHENSIVE METABOLIC PANEL
AST: 14 U/L (ref 0–37)
Albumin: 3.7 g/dL (ref 3.5–5.2)
Alkaline Phosphatase: 64 U/L (ref 39–117)
Chloride: 106 mEq/L (ref 96–112)
Potassium: 4.4 mEq/L (ref 3.5–5.3)
Sodium: 141 mEq/L (ref 135–145)
Total Protein: 6.5 g/dL (ref 6.0–8.3)

## 2012-06-03 ENCOUNTER — Encounter: Payer: Self-pay | Admitting: Family Medicine

## 2012-06-03 ENCOUNTER — Ambulatory Visit: Payer: Medicare Other | Admitting: Family Medicine

## 2012-06-03 ENCOUNTER — Other Ambulatory Visit: Payer: Self-pay

## 2012-06-03 MED ORDER — CARVEDILOL 6.25 MG PO TABS: 6.2500 mg | ORAL_TABLET | Freq: Two times a day (BID) | ORAL | Status: DC

## 2012-06-03 NOTE — Assessment & Plan Note (Signed)
Will repeat urine culture she's had extended amount of antibiotics.

## 2012-06-03 NOTE — Assessment & Plan Note (Addendum)
Her blood sugar remains uncontrolled, though it has improved from the past. I will increase her insulin to 40 units twice a day since she is now on very large doses of Lantus. She will continue her sliding scale which typically she is giving 12 units with each meal. Hold for her to endocrinology as well. Her metformin will be discontinued

## 2012-06-03 NOTE — Progress Notes (Signed)
  Subjective:    Patient ID: Cathy [name], female    DOB: Jun 16, 1930, 79 y.o.   MRN: [id number]  HPI Patient here to followup hypertension dizziness and recent admissions to hospital. At our last visit she was sent to the emergency room secondary to hypotension and presyncope she was found to have increased lactic acid and concern for early sepsis with UTI. She was then restarted on blood pressure medications and sent home with her previous regimen. Her physical therapist came out of the house and she is found to be hypotensive and was sent to the emergency room to be evaluated during that admission she was found to have minimally low cortisol were had TSH continues to be over suppressed and her blood pressure was very low. There was concern she's been taking the wrong blood pressure medications after they were adjusted per previous visits. She declines going to skilled nursing home although her family members and myself support this. She's here today with her grandson who is the power of attorney his name is Cathy [name]. She typically takes her blood pressure before her medications and they are usually 140-160 systolic. Her blood sugars have ranged from 250 to 350s fasting and before meals. She is using her sliding scale and she's also given Lantus 70 units at bedtime. Her granddaughter called in with concerns that she has multiple bottles at home with different dosages that she's been taking Her blood pressure was low again yesterday she was told to hold her medications last night and this morning and this is now the followup appointment  Review of Systems   GEN- +fatigue, fever, weight loss,weakness, recent illness HEENT- denies eye drainage, change in vision, nasal discharge, CVS- denies chest pain, palpitations RESP- denies SOB, cough, wheeze ABD- denies N/V, change in stools, abd pain GU- denies dysuria, hematuria, dribbling, incontinence MSK- denies joint pain, muscle aches, injury Neuro-  denies headache, +dizziness, syncope, seizure activity      Objective:   Physical Exam GEN- NAD, alert and oriented x3, sitting in wheelchair, repeat BP 120/62, non toxic appearing HEENT- PERRL, EOMI, non injected sclera, pink conjunctiva, oropharynx clear CVS- RRR, normal S1, S2 RESP-CTAB decreased bases ABD-NABS,soft,NT,ND EXT- No edema, venous stasis changes Pulses- Radial 2+ Neuro- CNII-XII in tact, clear mentation        Assessment & Plan:

## 2012-06-03 NOTE — Assessment & Plan Note (Signed)
For now we will hold on restarting the gabapentin today sure that none of her other medications are interfering with her dizzy episodes of hypotension

## 2012-06-03 NOTE — Assessment & Plan Note (Signed)
I think this is more of the medication confusion and she has multiple doses of her blood pressure medications at home. I will have [facility] go out of the home to look at her bottles and recheck her blood pressure. She will be on the Coreg 6.25 mg twice a day with the Ramipril 5 mg she was previously on 25 mg of Coreg. If for some reason she is taking the correct dose I would decrease her rate of about 2.5 mg and her carvedilol to 3.125 mg

## 2012-06-03 NOTE — Assessment & Plan Note (Signed)
Her repeat thyroid studies over the past couple months continued to show a very low TSH. She was on 56 mcg of Synthroid. I will drop her down to 25 mcg she also had a mildly low cortisol I will have endocrinology evaluate this as well

## 2012-06-03 NOTE — Assessment & Plan Note (Signed)
Medications reviewed per above Hypotension is major contributing factor

## 2012-06-04 LAB — URINE CULTURE: Colony Count: 45000

## 2012-06-04 MED ORDER — FLUCONAZOLE 100 MG PO TABS: 100.0000 mg | ORAL_TABLET | Freq: Every day | ORAL | Status: DC

## 2012-06-04 NOTE — Addendum Note (Signed)
Addended by: Milinda Antis F on: 06/04/2012 12:26 PM   Modules accepted: Orders

## 2012-06-09 ENCOUNTER — Ambulatory Visit (INDEPENDENT_AMBULATORY_CARE_PROVIDER_SITE_OTHER): Payer: Medicare Other | Admitting: Physician Assistant

## 2012-06-09 ENCOUNTER — Telehealth: Payer: Self-pay

## 2012-06-09 VITALS — BP 96/64 | HR 90 | Ht 65.0 in | Wt 184.1 lb

## 2012-06-09 DIAGNOSIS — I2589 Other forms of chronic ischemic heart disease: Secondary | ICD-10-CM

## 2012-06-09 DIAGNOSIS — I959 Hypotension, unspecified: Secondary | ICD-10-CM

## 2012-06-09 DIAGNOSIS — I255 Ischemic cardiomyopathy: Secondary | ICD-10-CM

## 2012-06-09 DIAGNOSIS — I779 Disorder of arteries and arterioles, unspecified: Secondary | ICD-10-CM

## 2012-06-09 DIAGNOSIS — E785 Hyperlipidemia, unspecified: Secondary | ICD-10-CM

## 2012-06-09 MED ORDER — RAMIPRIL 2.5 MG PO CAPS: 2.5000 mg | ORAL_CAPSULE | Freq: Two times a day (BID) | ORAL | Status: DC

## 2012-06-09 MED ORDER — RAMIPRIL 2.5 MG PO CAPS: 2.5000 mg | ORAL_CAPSULE | Freq: Every day | ORAL | Status: DC

## 2012-06-09 MED ORDER — FENOFIBRATE 160 MG PO TABS: 160.0000 mg | ORAL_TABLET | Freq: Every day | ORAL | Status: DC

## 2012-06-09 NOTE — Assessment & Plan Note (Signed)
Patient is reportedly severely allergic to Lipitor, citing prior hospitalization. She is tolerating omega-3 fish oil, and we will add fenofibrate 160 mg daily for aggressive management of recently documented hypertriglyceridemia. Followup lipid profile in 12 weeks.

## 2012-06-09 NOTE — Progress Notes (Signed)
Primary Cardiologist: Jerral Bonito, MD   HPI: Patient returns for early scheduled followup.  At time of recent OV, I ordered carotid Dopplers for further evaluation of right sided bruit. Same-day study yielded 80-99% RICA stenosis, and I referred patient to vascular surgery in [facility], for further recommendations.  Patient was subsequently evaluated by Dr. Gretta Began, who recommended deferring endarterectomy until patient had completely recovered from her MI in March.  She reports today having been recently hospitalized on 2 separate occasions, at [facility] and [facility], for near-syncope and marked hypotension (reportedly "60/30"). We were not formally consulted.  Clinically, she denies any symptoms suggestive of active angina or congestive heart failure. She has not experienced any further near syncope. However, she does complain of blurred vision.  Allergies  Allergen Reactions  . Atorvastatin Other (See Comments)    Unknown-patient admitted to hospital after taking  . Citrus Dermatitis    BREAK OUT ON BODY  . Iodine   . Iohexol      Desc: ANAPHYLAXIS   . Zolpidem Tartrate     REACTION: hallucinations  . Milk-Related Compounds Rash    Current Outpatient Prescriptions  Medication Sig Dispense Refill  . aspirin EC 81 MG tablet Take 81 mg by mouth every morning.      . carvedilol (COREG) 6.25 MG tablet Take 1 tablet (6.25 mg total) by mouth 2 (two) times daily with a meal.  60 tablet  1  . clopidogrel (PLAVIX) 75 MG tablet Take 75 mg by mouth every morning.      . diphenhydrAMINE (SOMINEX) 25 MG tablet Take 25 mg by mouth at bedtime as needed for sleep.      . fluconazole (DIFLUCAN) 100 MG tablet Take 1 tablet (100 mg total) by mouth daily. For yeast  7 tablet  0  . fluticasone (FLONASE) 50 MCG/ACT nasal spray Place 1 spray into the nose daily as needed. For allergies      . folic acid (FOLVITE) 1 MG tablet Take 1 mg by mouth every morning.       . furosemide (LASIX) 40 MG tablet Take 1  tablet (40 mg total) by mouth daily.  60 tablet  0  . HYDROcodone-acetaminophen (NORCO/VICODIN) 5-325 MG per tablet Take 1 tablet by mouth daily as needed for pain.       [name] insulin glargine (LANTUS) 100 UNIT/ML injection Inject 40 Units into the skin 2 (two) times daily.       . insulin lispro (HUMALOG) 100 UNIT/ML injection Inject 2-10 Units into the skin 3 (three) times daily before meals. INJECT 2 TO 10 UNITS SUBCUTANEOUSLY FOUR TIMES DAILY AS DIRECTED USING SLIDING SCALE.      [name] levothyroxine (LEVOTHROID) 25 MCG tablet Take 1 tablet (25 mcg total) by mouth daily.  30 tablet  3  . meclizine (ANTIVERT) 25 MG tablet Take 25 mg by mouth 3 (three) times daily as needed for dizziness.      . nitroGLYCERIN (NITROSTAT) 0.4 MG SL tablet Place 0.4 mg under the tongue every 5 (five) minutes as needed for chest pain. May repeat times three.      . omega-3 acid ethyl esters (LOVAZA) 1 G capsule Take 2 capsules (2 g total) by mouth 2 (two) times daily.  60 capsule  6  . promethazine (PHENERGAN) 25 MG tablet Take 25 mg by mouth every 6 (six) hours as needed for nausea.      . ramipril (ALTACE) 5 MG capsule Take 5 mg by mouth every morning.      [name]  isosorbide mononitrate (IMDUR) 30 MG 24 hr tablet Take 30 mg by mouth every morning.        No current facility-administered medications for this visit.    Past Medical History  Diagnosis Date  . Hypertension     Unspecified  . Hyperlipidemia     Mixed  . CAD (coronary artery disease) 08/21/09    a. Catheterization 2006 occluded OM vein graft b.   nuclear 2008 question slight ischemia c. anterior MI 04/15/12 s/p PTCA-LCx  . S/P CABG (coronary artery bypass graft) 2003     2003  LIMA, LAD, SVG to the OM, SVG right coronary artery  . Aneurysm 2003    Ascending thoracic aneurysm repair with a graft at time of CABG  . Diabetes mellitus     Insulin dependent  . Gastroparesis   . Depression   . Allergy history, radiographic dye     Contrast dye allergy  .  Ejection fraction     EF 50%  //  Ejection fraction 25-30% at the time of stress echo,  May, 2013  . Previous back surgery   . Syncope 2008    Question?  2008  . Statin intolerance   . Shortness of breath     Exertional shortness of breath, May, 2013  . Systolic CHF     June, 2013  . Ischemic cardiomyopathy 06/2011    a. Echo 04/19/12: EF 25-30%, mod LVH, diffuse HK, trivial AI  . Thyroid disease   . CHF (congestive heart failure)   . Nausea     October, 2013  . Hypotension     October, 2013  . Hypothyroidism     Treated with low-dose Synthroid  . CKD (chronic kidney disease)   . Carotid artery disease     Stenosis greater than 80%, asymptomatic, April, 2014, followed carefully by Dr. Arbie Cookey with consideration for possible endarterectomy  when overall medical status stabilizes    Past Surgical History  Procedure Laterality Date  . Coronary artery bypass graft  2003     LIMA, LAD, SVG to the OM, SVG right coronary artery  . Bladder tacking    . Salivary gland surgery    . Total abdominal hysterectomy    . Back surgery    . Breast surgery    . External ear surgery    . Cardiac catheterization  04/15/12    60-70% proximal LAD, 95% mid AV groove circumflex s/p PTCA, proximal RCA occlusion, patent LIMA to LAD, patent SVG to distal RCA, chronically occluded SVG to circumflex; LVEF 25-30%    History   Social History  . Marital Status: Divorced    Spouse Name: N/A    Number of Children: N/A  . Years of Education: N/A   Occupational History  . Retired    Social History Main Topics  . Smoking status: Never Smoker   . Smokeless tobacco: Never Used  . Alcohol Use: No  . Drug Use: No  . Sexually Active: Not Currently   Other Topics Concern  . Not on file   Social History Narrative  . No narrative on file    Family History  Problem Relation Age of Onset  . Heart disease Other   . Heart disease Mother   . Hypertension Mother   . Heart disease Father   .  Hypertension Father     ROS: no nausea, vomiting; no fever, chills; no melena, hematochezia; no claudication  PHYSICAL EXAM: BP 96/64  Pulse 90  Ht  5\' 5"  (1.651 m)  Wt 184 lb 1.9 oz (83.516 kg)  BMI 30.64 kg/m2  SpO2 97% GENERAL: 79 year old female; NAD  HEENT: NCAT, PERRLA, EOMI; sclera clear; no xanthelasma  NECK: R Carotid bruit; no JVD  CARDIAC: RRR (S1, S2); no significant murmurs; no rubs or gallops  ABDOMEN: soft, non-tender; intact BS  EXTREMETIES: no significant peripheral edema  SKIN: warm/dry; no obvious rash/lesions  MUSCULOSKELETAL: no joint deformity  NEURO: no focal deficit; NL affect    EKG:    ASSESSMENT & PLAN:  Cardiomyopathy, ischemic Continue current medication regimen, including DAPT for at least one year. Unable to uptitrate carvedilol secondary to relative hypotension. We'll adjust ACE inhibitor dose to 2.5 twice a day, so as to minimize effect on BP.   Carotid artery disease Recommendation is to defer CEA for at least 3 months, following MI this past March.   Hypotension ACE inhibitor dose to be cut in half, so as to better accommodate persistent hypotension.  Hyperlipidemia Patient is reportedly severely allergic to Lipitor, citing prior hospitalization. She is tolerating omega-3 fish oil, and we will add fenofibrate 160 mg daily for aggressive management of recently documented hypertriglyceridemia. Followup lipid profile in 12 weeks.    Gene Rilei Kravitz, PAC

## 2012-06-09 NOTE — Assessment & Plan Note (Signed)
Continue current medication regimen, including DAPT for at least one year. Unable to uptitrate carvedilol secondary to relative hypotension. We'll adjust ACE inhibitor dose to 2.5 twice a day, so as to minimize effect on BP.

## 2012-06-09 NOTE — Assessment & Plan Note (Signed)
Recommendation is to defer CEA for at least 3 months, following MI this past March.

## 2012-06-09 NOTE — Assessment & Plan Note (Signed)
ACE inhibitor dose to be cut in half, so as to better accommodate persistent hypotension.

## 2012-06-09 NOTE — Patient Instructions (Addendum)
   Change Altace dose to 2.5mg  twice a day    Begin Fenofibrate 160mg  daily  Labs in 12 weeks for cholesterol - will send reminder in mail Continue all other current medications. Follow up next available with Dr. Myrtis Ser

## 2012-06-10 ENCOUNTER — Telehealth: Payer: Self-pay | Admitting: *Deleted

## 2012-06-10 ENCOUNTER — Other Ambulatory Visit: Payer: Self-pay | Admitting: Family Medicine

## 2012-06-10 NOTE — Telephone Encounter (Signed)
Pharmacy dispensed fenofibrate 145 mg to patient instead of 160 mg. Nurse advised patient and OT with [facility], that pharmacy would be called to clarify dosage.

## 2012-06-10 NOTE — Telephone Encounter (Signed)
Spoke with Harrold Donath at [facility] and he said that fenofibrate 160 mg was equivalent to tricor/fenofibrate 145 mg. Nurse called and informed patient and OT.

## 2012-06-10 NOTE — Telephone Encounter (Signed)
Nothing noted on his notes of concern

## 2012-06-14 ENCOUNTER — Other Ambulatory Visit: Payer: Self-pay | Admitting: Family Medicine

## 2012-06-15 ENCOUNTER — Telehealth: Payer: Self-pay | Admitting: Family Medicine

## 2012-06-15 NOTE — Telephone Encounter (Signed)
Increase lantus to 45 units twice a day

## 2012-06-15 NOTE — Telephone Encounter (Signed)
Called patient and left message for them to return call at the office   

## 2012-06-15 NOTE — Telephone Encounter (Signed)
Spoke with patient (nurse had gone) and she states that her chest only hurts when she stands up. No other time. States this is not a new problem for her and she has been taking it easy. I again advised if it worsens or she feels like she needs to be evaluated to go straight to the ED. Pt understood

## 2012-06-15 NOTE — Telephone Encounter (Signed)
Please send to endocrine in [place] because Dr. Fransico Him Is closed for June

## 2012-06-15 NOTE — Telephone Encounter (Signed)
BP was 130/70 and sugar this am was 221

## 2012-06-15 NOTE — Telephone Encounter (Signed)
Please check and see what her blood pressures are and blood sugars

## 2012-06-16 ENCOUNTER — Telehealth: Payer: Self-pay | Admitting: Family Medicine

## 2012-06-16 NOTE — Telephone Encounter (Signed)
Patient aware.

## 2012-06-16 NOTE — Telephone Encounter (Signed)
Thinks there is something wrong with her thyroid and wants you to review her test results

## 2012-06-17 ENCOUNTER — Encounter: Payer: Self-pay | Admitting: Family Medicine

## 2012-06-17 ENCOUNTER — Telehealth: Payer: Self-pay | Admitting: Family Medicine

## 2012-06-17 NOTE — Telephone Encounter (Signed)
Spoke with patient and she is not having facial swelling she was concerned with her thyroid

## 2012-06-17 NOTE — Telephone Encounter (Signed)
Wants all diabetic supplies sent to [facility]. Can I get it written out to be faxed?

## 2012-06-17 NOTE — Telephone Encounter (Signed)
I have already reviewed her thyroid labs and we discussed this 3 weeks ago She has a new does of thyroid medication for now- sent at last visit She will be seeing the specialist for diabetes and her thyroid If she is swelling she needs to come in for a visit

## 2012-06-17 NOTE — Telephone Encounter (Signed)
Form completed.

## 2012-06-22 ENCOUNTER — Encounter: Payer: Self-pay | Admitting: Family Medicine

## 2012-06-22 ENCOUNTER — Telehealth: Payer: Self-pay | Admitting: Family Medicine

## 2012-06-22 NOTE — Telephone Encounter (Signed)
I called pt and it wasn't a new complaint just wanted to know if Dr. Jeanice Lim had referred her to ENT to check her thyroid. I told her she had a referral to [facility] Endocrinlogist and I will call to check on referral.

## 2012-06-22 NOTE — Progress Notes (Unsigned)
Patient ID: Cathy [name], female   DOB: 1930-05-31, 79 y.o.   MRN: [id number] Yesterday [facility] called stating that Cathy [name] was having throbbing pain in her face and wanted to know if ENT had been referred. I spoke to Dr. Jeanice Lim and stated if she is having pain in face then she needed to be seen. I have called numerous times yesterday and today and left messages and no one has called me back. The tech was Pearline Cables and her number was [phone].

## 2012-06-22 NOTE — Telephone Encounter (Signed)
Please call pt and speak to her directly She did not have any facial swelling?? Unless this is new, triage this complaint  She has been referred to [facility] endocrinology for her diabetes and her thyroid, you can call to verify if they have an appt for her before you call her back.

## 2012-06-22 NOTE — Telephone Encounter (Signed)
noted 

## 2012-06-25 ENCOUNTER — Ambulatory Visit (INDEPENDENT_AMBULATORY_CARE_PROVIDER_SITE_OTHER): Payer: Medicare Other | Admitting: Family Medicine

## 2012-06-25 VITALS — BP 100/70 | HR 78 | Temp 97.5°F | Resp 20 | Wt 186.0 lb

## 2012-06-25 DIAGNOSIS — E1142 Type 2 diabetes mellitus with diabetic polyneuropathy: Secondary | ICD-10-CM

## 2012-06-25 DIAGNOSIS — R599 Enlarged lymph nodes, unspecified: Secondary | ICD-10-CM

## 2012-06-25 DIAGNOSIS — R591 Generalized enlarged lymph nodes: Secondary | ICD-10-CM

## 2012-06-25 DIAGNOSIS — I10 Essential (primary) hypertension: Secondary | ICD-10-CM

## 2012-06-25 DIAGNOSIS — E1149 Type 2 diabetes mellitus with other diabetic neurological complication: Secondary | ICD-10-CM

## 2012-06-25 MED ORDER — CEPHALEXIN 500 MG PO CAPS: 500.0000 mg | ORAL_CAPSULE | Freq: Two times a day (BID) | ORAL | Status: DC

## 2012-06-25 NOTE — Progress Notes (Signed)
  Subjective:    Patient ID: Cathy [name], female    DOB: 1930/07/24, 79 y.o.   MRN: [id number]  HPI Patient here secondary to recurrent swelling of her left neck and pain into her left ear. She thinks that her lymph nodes have been swelling on and off. She had a CT scan done in May which did not show any pathological nodes or an abscess. She was told she had an abscess by one of the inpatient physicians per report. I reviewed the discharge summary and there was no mention of this.  Blood sugars have been very elevated 200 to 350s fasting 300s to 400 with meals. She's been taking sliding scale and given herself 45 units of Lantus twice a day. Her appointment with endocrinology still pending    Review of Systems  - per above   GEN- denies fatigue, fever, weight loss,weakness, recent illness HEENT- denies eye drainage, change in vision, nasal discharge, CVS- denies chest pain, palpitations RESP- denies SOB, cough, wheeze ABD- denies N/V, change in stools, abd pain GU- denies dysuria, hematuria, dribbling, incontinence MSK- denies joint pain, muscle aches, injury Neuro- denies headache, dizziness, syncope, seizure activity      Objective:   Physical Exam  GEN- NAD, alert and oriented x3, sitting in wheelchair, HEENT- PERRL, EOMI, non injected sclera, pink conjunctiva, oropharynx clear, TM clear bilat no effusion Neck- Shotty LAD, no abscess seen, TTP left side of neck, CVS- RRR, no murmur RESP-CTAB decreased bases EXT- No edema, venous stasis changes Pulses- Radial 2+ Neuro- CNII-XII in tact,        Assessment & Plan:

## 2012-06-25 NOTE — Patient Instructions (Addendum)
Referral to physician [facility] Take the antibiotics Use the heating pad Increase lantus to 50 units at bedtime, continue 45units in the morning Take 15 units of humalog at with each meal We will call about the endocrinology appt F/U 2 months

## 2012-06-26 ENCOUNTER — Ambulatory Visit (HOSPITAL_COMMUNITY): Admit: 2012-06-26 | Payer: Self-pay | Admitting: Cardiovascular Disease

## 2012-06-26 ENCOUNTER — Encounter (HOSPITAL_COMMUNITY): Payer: Self-pay | Admitting: Emergency Medicine

## 2012-06-26 ENCOUNTER — Inpatient Hospital Stay (HOSPITAL_COMMUNITY)
Admission: EM | Admit: 2012-06-26 | Discharge: 2012-07-09 | DRG: 247 | Disposition: A | Discharge status: Home / Self Care | Payer: Medicare Other | Attending: Cardiology | Admitting: Cardiology

## 2012-06-26 ENCOUNTER — Emergency Department (HOSPITAL_COMMUNITY): Payer: Medicare Other

## 2012-06-26 ENCOUNTER — Encounter (HOSPITAL_COMMUNITY)
Admission: EM | Disposition: A | Discharge status: Home / Self Care | Payer: Self-pay | Source: Home / Self Care | Attending: Cardiology

## 2012-06-26 ENCOUNTER — Encounter: Payer: Self-pay | Admitting: Family Medicine

## 2012-06-26 DIAGNOSIS — I2510 Atherosclerotic heart disease of native coronary artery without angina pectoris: Secondary | ICD-10-CM | POA: Diagnosis present

## 2012-06-26 DIAGNOSIS — E785 Hyperlipidemia, unspecified: Secondary | ICD-10-CM | POA: Diagnosis present

## 2012-06-26 DIAGNOSIS — I472 Ventricular tachycardia, unspecified: Secondary | ICD-10-CM | POA: Diagnosis not present

## 2012-06-26 DIAGNOSIS — B965 Pseudomonas (aeruginosa) (mallei) (pseudomallei) as the cause of diseases classified elsewhere: Secondary | ICD-10-CM | POA: Diagnosis present

## 2012-06-26 DIAGNOSIS — I509 Heart failure, unspecified: Secondary | ICD-10-CM | POA: Diagnosis present

## 2012-06-26 DIAGNOSIS — F3289 Other specified depressive episodes: Secondary | ICD-10-CM | POA: Diagnosis present

## 2012-06-26 DIAGNOSIS — I2589 Other forms of chronic ischemic heart disease: Secondary | ICD-10-CM | POA: Diagnosis present

## 2012-06-26 DIAGNOSIS — I4891 Unspecified atrial fibrillation: Secondary | ICD-10-CM | POA: Diagnosis present

## 2012-06-26 DIAGNOSIS — Z7902 Long term (current) use of antithrombotics/antiplatelets: Secondary | ICD-10-CM

## 2012-06-26 DIAGNOSIS — I4729 Other ventricular tachycardia: Secondary | ICD-10-CM | POA: Diagnosis not present

## 2012-06-26 DIAGNOSIS — I129 Hypertensive chronic kidney disease with stage 1 through stage 4 chronic kidney disease, or unspecified chronic kidney disease: Secondary | ICD-10-CM | POA: Diagnosis present

## 2012-06-26 DIAGNOSIS — E1142 Type 2 diabetes mellitus with diabetic polyneuropathy: Secondary | ICD-10-CM | POA: Diagnosis present

## 2012-06-26 DIAGNOSIS — Z888 Allergy status to other drugs, medicaments and biological substances status: Secondary | ICD-10-CM

## 2012-06-26 DIAGNOSIS — E781 Pure hyperglyceridemia: Secondary | ICD-10-CM | POA: Diagnosis present

## 2012-06-26 DIAGNOSIS — I25810 Atherosclerosis of coronary artery bypass graft(s) without angina pectoris: Secondary | ICD-10-CM | POA: Diagnosis present

## 2012-06-26 DIAGNOSIS — Z9861 Coronary angioplasty status: Secondary | ICD-10-CM

## 2012-06-26 DIAGNOSIS — I200 Unstable angina: Secondary | ICD-10-CM

## 2012-06-26 DIAGNOSIS — K3184 Gastroparesis: Secondary | ICD-10-CM | POA: Diagnosis present

## 2012-06-26 DIAGNOSIS — Z8249 Family history of ischemic heart disease and other diseases of the circulatory system: Secondary | ICD-10-CM

## 2012-06-26 DIAGNOSIS — N183 Chronic kidney disease, stage 3 unspecified: Secondary | ICD-10-CM | POA: Diagnosis present

## 2012-06-26 DIAGNOSIS — E1140 Type 2 diabetes mellitus with diabetic neuropathy, unspecified: Secondary | ICD-10-CM

## 2012-06-26 DIAGNOSIS — F329 Major depressive disorder, single episode, unspecified: Secondary | ICD-10-CM | POA: Diagnosis present

## 2012-06-26 DIAGNOSIS — I255 Ischemic cardiomyopathy: Secondary | ICD-10-CM

## 2012-06-26 DIAGNOSIS — Z79899 Other long term (current) drug therapy: Secondary | ICD-10-CM

## 2012-06-26 DIAGNOSIS — E1149 Type 2 diabetes mellitus with other diabetic neurological complication: Secondary | ICD-10-CM | POA: Diagnosis present

## 2012-06-26 DIAGNOSIS — Z90710 Acquired absence of both cervix and uterus: Secondary | ICD-10-CM

## 2012-06-26 DIAGNOSIS — I10 Essential (primary) hypertension: Secondary | ICD-10-CM

## 2012-06-26 DIAGNOSIS — Z789 Other specified health status: Secondary | ICD-10-CM | POA: Diagnosis present

## 2012-06-26 DIAGNOSIS — E039 Hypothyroidism, unspecified: Secondary | ICD-10-CM | POA: Diagnosis present

## 2012-06-26 DIAGNOSIS — I6529 Occlusion and stenosis of unspecified carotid artery: Secondary | ICD-10-CM | POA: Diagnosis present

## 2012-06-26 DIAGNOSIS — Z7982 Long term (current) use of aspirin: Secondary | ICD-10-CM

## 2012-06-26 DIAGNOSIS — Z91041 Radiographic dye allergy status: Secondary | ICD-10-CM

## 2012-06-26 DIAGNOSIS — I252 Old myocardial infarction: Secondary | ICD-10-CM

## 2012-06-26 DIAGNOSIS — I5022 Chronic systolic (congestive) heart failure: Secondary | ICD-10-CM

## 2012-06-26 DIAGNOSIS — Z951 Presence of aortocoronary bypass graft: Secondary | ICD-10-CM

## 2012-06-26 DIAGNOSIS — Z794 Long term (current) use of insulin: Secondary | ICD-10-CM

## 2012-06-26 DIAGNOSIS — N390 Urinary tract infection, site not specified: Secondary | ICD-10-CM | POA: Diagnosis present

## 2012-06-26 DIAGNOSIS — I214 Non-ST elevation (NSTEMI) myocardial infarction: Principal | ICD-10-CM | POA: Diagnosis present

## 2012-06-26 HISTORY — DX: Polyneuropathy, unspecified: G62.9

## 2012-06-26 HISTORY — DX: Pure hyperglyceridemia: E78.1

## 2012-06-26 HISTORY — DX: Ventricular tachycardia: I47.2

## 2012-06-26 HISTORY — DX: Thoracic aortic aneurysm, without rupture: I71.2

## 2012-06-26 HISTORY — DX: Other ventricular tachycardia: I47.29

## 2012-06-26 LAB — PROTIME-INR
INR: 0.99 (ref 0.00–1.49)
Prothrombin Time: 13 seconds (ref 11.6–15.2)

## 2012-06-26 LAB — POCT I-STAT, CHEM 8
Calcium, Ion: 1.27 mmol/L (ref 1.13–1.30)
Chloride: 110 mEq/L (ref 96–112)
Glucose, Bld: 324 mg/dL — ABNORMAL HIGH (ref 70–99)
HCT: 43 % (ref 36.0–46.0)

## 2012-06-26 LAB — GLUCOSE, CAPILLARY

## 2012-06-26 LAB — HEMOGLOBIN A1C: Mean Plasma Glucose: 194 mg/dL — ABNORMAL HIGH (ref <117)

## 2012-06-26 LAB — TROPONIN I: Troponin I: <0.3 ng/mL (ref <0.30)

## 2012-06-26 SURGERY — LEFT HEART CATHETERIZATION WITH CORONARY ANGIOGRAM
Anesthesia: LOCAL

## 2012-06-26 MED ORDER — CARVEDILOL 6.25 MG PO TABS
6.2500 mg | ORAL_TABLET | Freq: Two times a day (BID) | ORAL | Status: DC
  Administered 2012-06-26 – 2012-06-27 (×2): 6.25 mg via ORAL
  Filled 2012-06-26 (×4): qty 1

## 2012-06-26 MED ORDER — MECLIZINE HCL 25 MG PO TABS
25.0000 mg | ORAL_TABLET | Freq: Three times a day (TID) | ORAL | Status: DC | PRN
  Administered 2012-07-09: 25 mg via ORAL
  Filled 2012-06-26: qty 1

## 2012-06-26 MED ORDER — ASPIRIN 300 MG RE SUPP
300.0000 mg | RECTAL | Status: AC
  Filled 2012-06-26: qty 1

## 2012-06-26 MED ORDER — INSULIN ASPART 100 UNIT/ML ~~LOC~~ SOLN
0.0000 [IU] | Freq: Three times a day (TID) | SUBCUTANEOUS | Status: DC
  Administered 2012-06-26: 8 [IU] via SUBCUTANEOUS
  Administered 2012-06-27 (×2): 5 [IU] via SUBCUTANEOUS
  Administered 2012-06-27: 11 [IU] via SUBCUTANEOUS
  Administered 2012-06-28: 5 [IU] via SUBCUTANEOUS
  Administered 2012-06-28: 11 [IU] via SUBCUTANEOUS
  Administered 2012-06-28 – 2012-06-29 (×2): 8 [IU] via SUBCUTANEOUS
  Administered 2012-06-29 (×2): 5 [IU] via SUBCUTANEOUS
  Administered 2012-06-30: 3 [IU] via SUBCUTANEOUS
  Administered 2012-06-30: 5 [IU] via SUBCUTANEOUS
  Administered 2012-06-30: 3 [IU] via SUBCUTANEOUS
  Administered 2012-07-01: 5 [IU] via SUBCUTANEOUS
  Administered 2012-07-01 (×2): 8 [IU] via SUBCUTANEOUS
  Administered 2012-07-02: 5 [IU] via SUBCUTANEOUS
  Administered 2012-07-02: 3 [IU] via SUBCUTANEOUS
  Administered 2012-07-02: 15 [IU] via SUBCUTANEOUS
  Administered 2012-07-03: 8 [IU] via SUBCUTANEOUS
  Administered 2012-07-03: 11 [IU] via SUBCUTANEOUS
  Administered 2012-07-03: 5 [IU] via SUBCUTANEOUS
  Administered 2012-07-04: 8 [IU] via SUBCUTANEOUS
  Administered 2012-07-04: 11 [IU] via SUBCUTANEOUS
  Administered 2012-07-04: 5 [IU] via SUBCUTANEOUS
  Administered 2012-07-05 (×2): 15 [IU] via SUBCUTANEOUS
  Administered 2012-07-06: 8 [IU] via SUBCUTANEOUS
  Administered 2012-07-06: 3 [IU] via SUBCUTANEOUS
  Administered 2012-07-06: 5 [IU] via SUBCUTANEOUS
  Administered 2012-07-07: 11 [IU] via SUBCUTANEOUS
  Administered 2012-07-07: 3 [IU] via SUBCUTANEOUS
  Administered 2012-07-07: 5 [IU] via SUBCUTANEOUS
  Administered 2012-07-08: 3 [IU] via SUBCUTANEOUS
  Administered 2012-07-08 (×2): 11 [IU] via SUBCUTANEOUS
  Administered 2012-07-09: 5 [IU] via SUBCUTANEOUS
  Administered 2012-07-09: 3 [IU] via SUBCUTANEOUS

## 2012-06-26 MED ORDER — CLOPIDOGREL BISULFATE 75 MG PO TABS
75.0000 mg | ORAL_TABLET | Freq: Every morning | ORAL | Status: DC
  Administered 2012-06-26 – 2012-07-08 (×13): 75 mg via ORAL
  Filled 2012-06-26 (×15): qty 1

## 2012-06-26 MED ORDER — HYDROCODONE-ACETAMINOPHEN 5-325 MG PO TABS
1.0000 | ORAL_TABLET | Freq: Every day | ORAL | Status: DC | PRN
  Administered 2012-06-29 – 2012-07-06 (×5): 1 via ORAL
  Filled 2012-06-26 (×5): qty 1

## 2012-06-26 MED ORDER — HEPARIN BOLUS VIA INFUSION
2000.0000 [IU] | Freq: Once | INTRAVENOUS | Status: AC
  Administered 2012-06-26: 2000 [IU] via INTRAVENOUS
  Filled 2012-06-26: qty 2000

## 2012-06-26 MED ORDER — NITROGLYCERIN 0.4 MG SL SUBL
0.4000 mg | SUBLINGUAL_TABLET | SUBLINGUAL | Status: DC | PRN
  Administered 2012-07-02 (×3): 0.4 mg via SUBLINGUAL
  Filled 2012-06-26: qty 25
  Filled 2012-06-26: qty 75

## 2012-06-26 MED ORDER — FENOFIBRATE 160 MG PO TABS
160.0000 mg | ORAL_TABLET | Freq: Every day | ORAL | Status: DC
  Administered 2012-06-26 – 2012-07-09 (×13): 160 mg via ORAL
  Filled 2012-06-26 (×14): qty 1

## 2012-06-26 MED ORDER — RAMIPRIL 2.5 MG PO CAPS
2.5000 mg | ORAL_CAPSULE | Freq: Two times a day (BID) | ORAL | Status: DC
  Administered 2012-06-26 – 2012-07-09 (×25): 2.5 mg via ORAL
  Filled 2012-06-26 (×28): qty 1

## 2012-06-26 MED ORDER — NITROGLYCERIN 0.4 MG SL SUBL: 0.4000 mg | SUBLINGUAL_TABLET | SUBLINGUAL | Status: DC | PRN

## 2012-06-26 MED ORDER — MORPHINE SULFATE 2 MG/ML IJ SOLN
2.0000 mg | Freq: Once | INTRAMUSCULAR | Status: AC
  Administered 2012-06-26: 2 mg via INTRAVENOUS
  Filled 2012-06-26: qty 1

## 2012-06-26 MED ORDER — PROMETHAZINE HCL 25 MG PO TABS
25.0000 mg | ORAL_TABLET | Freq: Four times a day (QID) | ORAL | Status: DC | PRN
  Administered 2012-07-01 – 2012-07-09 (×3): 25 mg via ORAL
  Filled 2012-06-26 (×3): qty 1

## 2012-06-26 MED ORDER — ASPIRIN EC 81 MG PO TBEC
81.0000 mg | DELAYED_RELEASE_TABLET | Freq: Every day | ORAL | Status: DC
  Administered 2012-06-27 – 2012-07-09 (×12): 81 mg via ORAL
  Filled 2012-06-26 (×13): qty 1

## 2012-06-26 MED ORDER — HEPARIN (PORCINE) IN NACL 100-0.45 UNIT/ML-% IJ SOLN
1700.0000 [IU]/h | INTRAMUSCULAR | Status: DC
  Administered 2012-06-26: 1000 [IU]/h via INTRAVENOUS
  Administered 2012-06-27: 1350 [IU]/h via INTRAVENOUS
  Administered 2012-06-27: 1500 [IU]/h via INTRAVENOUS
  Administered 2012-06-29 (×2): 1700 [IU]/h via INTRAVENOUS
  Filled 2012-06-26 (×10): qty 250

## 2012-06-26 MED ORDER — ONDANSETRON HCL 4 MG/2ML IJ SOLN
4.0000 mg | Freq: Once | INTRAMUSCULAR | Status: AC
  Administered 2012-06-26: 4 mg via INTRAVENOUS
  Filled 2012-06-26: qty 2

## 2012-06-26 MED ORDER — HEPARIN BOLUS VIA INFUSION
3000.0000 [IU] | Freq: Once | INTRAVENOUS | Status: AC
  Administered 2012-06-26: 3000 [IU] via INTRAVENOUS
  Filled 2012-06-26: qty 3000

## 2012-06-26 MED ORDER — ONDANSETRON HCL 4 MG/2ML IJ SOLN
4.0000 mg | Freq: Four times a day (QID) | INTRAMUSCULAR | Status: DC | PRN
  Administered 2012-07-02: 4 mg via INTRAVENOUS
  Filled 2012-06-26: qty 2

## 2012-06-26 MED ORDER — ASPIRIN EC 81 MG PO TBEC
81.0000 mg | DELAYED_RELEASE_TABLET | Freq: Every morning | ORAL | Status: DC
Start: 2012-06-26 — End: 2012-06-26

## 2012-06-26 MED ORDER — DIPHENHYDRAMINE HCL 25 MG PO CAPS
25.0000 mg | ORAL_CAPSULE | Freq: Every evening | ORAL | Status: DC | PRN
  Administered 2012-06-28: 25 mg via ORAL
  Filled 2012-06-26: qty 1

## 2012-06-26 MED ORDER — ACETAMINOPHEN 325 MG PO TABS
650.0000 mg | ORAL_TABLET | ORAL | Status: DC | PRN
  Administered 2012-06-28 – 2012-07-08 (×7): 650 mg via ORAL
  Filled 2012-06-26 (×7): qty 2

## 2012-06-26 MED ORDER — ASPIRIN 81 MG PO CHEW: 324.0000 mg | CHEWABLE_TABLET | ORAL | Status: AC

## 2012-06-26 MED ORDER — INSULIN GLARGINE 100 UNIT/ML ~~LOC~~ SOLN
45.0000 [IU] | Freq: Two times a day (BID) | SUBCUTANEOUS | Status: DC
  Administered 2012-06-26 – 2012-06-29 (×7): 45 [IU] via SUBCUTANEOUS
  Filled 2012-06-26 (×9): qty 0.45

## 2012-06-26 MED ORDER — FUROSEMIDE 40 MG PO TABS
40.0000 mg | ORAL_TABLET | Freq: Every day | ORAL | Status: DC
  Administered 2012-06-26 – 2012-07-09 (×13): 40 mg via ORAL
  Filled 2012-06-26 (×14): qty 1

## 2012-06-26 MED ORDER — DIPHENHYDRAMINE HCL (SLEEP) 25 MG PO TABS: 25.0000 mg | ORAL_TABLET | Freq: Every evening | ORAL | Status: DC | PRN

## 2012-06-26 MED ORDER — INSULIN ASPART 100 UNIT/ML ~~LOC~~ SOLN: 0.0000 [IU] | SUBCUTANEOUS | Status: DC

## 2012-06-26 MED ORDER — OMEGA-3-ACID ETHYL ESTERS 1 G PO CAPS
2.0000 g | ORAL_CAPSULE | Freq: Two times a day (BID) | ORAL | Status: DC
  Administered 2012-06-26 – 2012-07-09 (×25): 2 g via ORAL
  Filled 2012-06-26 (×27): qty 2

## 2012-06-26 MED ORDER — SODIUM CHLORIDE 0.9 % IV SOLN
INTRAVENOUS | Status: DC
  Administered 2012-07-04: 5 mL/h via INTRAVENOUS

## 2012-06-26 MED ORDER — FOLIC ACID 1 MG PO TABS
1.0000 mg | ORAL_TABLET | Freq: Every morning | ORAL | Status: DC
  Administered 2012-06-26 – 2012-07-09 (×13): 1 mg via ORAL
  Filled 2012-06-26 (×14): qty 1

## 2012-06-26 MED ORDER — LEVOTHYROXINE SODIUM 25 MCG PO TABS
25.0000 ug | ORAL_TABLET | Freq: Every day | ORAL | Status: DC
  Administered 2012-06-27 – 2012-07-09 (×13): 25 ug via ORAL
  Filled 2012-06-26 (×15): qty 1

## 2012-06-26 MED ORDER — INSULIN ASPART 100 UNIT/ML ~~LOC~~ SOLN: 15.0000 [IU] | Freq: Two times a day (BID) | SUBCUTANEOUS | Status: DC

## 2012-06-26 MED ORDER — ALPRAZOLAM 0.25 MG PO TABS
0.2500 mg | ORAL_TABLET | Freq: Two times a day (BID) | ORAL | Status: DC | PRN
  Administered 2012-07-02 – 2012-07-09 (×8): 0.25 mg via ORAL
  Filled 2012-06-26 (×8): qty 1

## 2012-06-26 MED ORDER — ISOSORBIDE MONONITRATE ER 30 MG PO TB24
30.0000 mg | ORAL_TABLET | Freq: Every morning | ORAL | Status: DC
  Administered 2012-06-26: 30 mg via ORAL
  Filled 2012-06-26 (×2): qty 1

## 2012-06-26 MED ORDER — FLUTICASONE PROPIONATE 50 MCG/ACT NA SUSP: 1.0000 | Freq: Every day | NASAL | Status: DC | PRN

## 2012-06-26 NOTE — Progress Notes (Signed)
ANTICOAGULATION CONSULT NOTE - Initial Consult  Pharmacy Consult for Heparin Indication: chest pain/ACS  Allergies  Allergen Reactions  . Atorvastatin Other (See Comments)    Unknown-patient admitted to hospital after taking  . Citrus Dermatitis    BREAK OUT ON BODY  . Iodine   . Iohexol      Desc: ANAPHYLAXIS   . Zolpidem Tartrate     REACTION: hallucinations  . Milk-Related Compounds Rash    Patient Measurements: Heparin Dosing Weight: 84kg  Vital Signs: BP: 153/69 mmHg (06/07 1300) Pulse Rate: 73 (06/07 1300)  Labs:  Recent Labs  06/26/12 0853  HGB 14.6  HCT 43.0  CREATININE 0.70    The CrCl is unknown because both a height and weight (above a minimum accepted value) are required for this calculation.   Medical History: Past Medical History  Diagnosis Date  . Hypertension     Unspecified  . Hyperlipidemia     Mixed  . CAD (coronary artery disease) 08/21/09    a. Catheterization 2006 occluded OM vein graft b.   nuclear 2008 question slight ischemia c. anterior MI 04/15/12 s/p PTCA-LCx  . S/P CABG (coronary artery bypass graft) 2003     2003  LIMA, LAD, SVG to the OM, SVG right coronary artery  . Aneurysm 2003    Ascending thoracic aneurysm repair with a graft at time of CABG  . Diabetes mellitus     Insulin dependent  . Gastroparesis   . Depression   . Allergy history, radiographic dye     Contrast dye allergy  . Ejection fraction     EF 50%  //  Ejection fraction 25-30% at the time of stress echo,  May, 2013  . Previous back surgery   . Syncope 2008    Question?  2008  . Statin intolerance   . Shortness of breath     Exertional shortness of breath, May, 2013  . Systolic CHF     June, 2013  . Ischemic cardiomyopathy 06/2011    a. Echo 04/19/12: EF 25-30%, mod LVH, diffuse HK, trivial AI  . Thyroid disease   . CHF (congestive heart failure)   . Nausea     October, 2013  . Hypotension     October, 2013  . Hypothyroidism     Treated with  low-dose Synthroid  . CKD (chronic kidney disease)   . Carotid artery disease     Stenosis greater than 80%, asymptomatic, April, 2014, followed carefully by Dr. Arbie Cookey with consideration for possible endarterectomy  when overall medical status stabilizes  . Hypertriglyceridemia   . Intermediate coronary syndrome      Assessment: 79 year old woman with a history of CAD and thoracic aortic aneurysm repair along with bypass surgery in 2003. She presents with chest pain but got some relief in the emergency room with nitroglycerin and morphine. Her troponins thus far are negative.   Goal of Therapy:  Heparin level 0.3-0.7 units/ml Monitor platelets by anticoagulation protocol: Yes   Plan:  - IV Heparin 3000 unit bolus followed by 1000 units/hr gtt - Heparin level in 8hrs  - Daily heparin levels and CBC   Vania Rea. Darin Engels.D. Clinical Pharmacist Pager [phone] Phone [phone] 06/26/2012 3:33 PM

## 2012-06-26 NOTE — Assessment & Plan Note (Signed)
Uncontrolled, endocrine appt pending Increase lantus to 50 units bedtime, continue 45 in AM Change off of Sliding scale to 15 units with each meal, she often needs at least 12 units on her sliding scale

## 2012-06-26 NOTE — H&P (Signed)
Admit date: 06/26/2012 Referring Physician Dr. Rosalia Hammers Primary Cardiologist Dr. Myrtis Ser Chief complaint/reason for admission: chest pain  HPI: 79 year old woman with a history of coronary artery disease and thoracic aortic aneurysm repair along with bypass surgery in 2003.  She has seen Dr. [name] for many years and has recently changed to Dr. Myrtis Ser.  She had a PTCA of her ramus vessel in March 2014.  Due to tortuous anatomy, stenting was unsuccessful.  She states that she has been having exertional chest discomfort which felt like her prior angina over the course of the past few weeks up until one week ago.  Over the course of the last 1 week, she has not had any angina when she does physical therapy.  She had home health physical therapy coming to her house.  Today, the pain came back.  It was up to 8/10 at home.  It was in her back.  She got some relief in the emergency room with nitroglycerin and morphine.  Currently she feels much better and the pain has resolved.  A few minutes ago, it was at a 2/10.  Her troponin is negative.  Initially, she was called code STEMI but her ECG was thought to be essentially unchanged.  Medical management was preferred.      PMH:    Past Medical History  Diagnosis Date  . Hypertension     Unspecified  . Hyperlipidemia     Mixed  . CAD (coronary artery disease) 08/21/09    a. Catheterization 2006 occluded OM vein graft b.   nuclear 2008 question slight ischemia c. anterior MI 04/15/12 s/p PTCA-LCx  . S/P CABG (coronary artery bypass graft) 2003     2003  LIMA, LAD, SVG to the OM, SVG right coronary artery  . Aneurysm 2003    Ascending thoracic aneurysm repair with a graft at time of CABG  . Diabetes mellitus     Insulin dependent  . Gastroparesis   . Depression   . Allergy history, radiographic dye     Contrast dye allergy  . Ejection fraction     EF 50%  //  Ejection fraction 25-30% at the time of stress echo,  May, 2013  . Previous back surgery   .  Syncope 2008    Question?  2008  . Statin intolerance   . Shortness of breath     Exertional shortness of breath, May, 2013  . Systolic CHF     June, 2013  . Ischemic cardiomyopathy 06/2011    a. Echo 04/19/12: EF 25-30%, mod LVH, diffuse HK, trivial AI  . Thyroid disease   . CHF (congestive heart failure)   . Nausea     October, 2013  . Hypotension     October, 2013  . Hypothyroidism     Treated with low-dose Synthroid  . CKD (chronic kidney disease)   . Carotid artery disease     Stenosis greater than 80%, asymptomatic, April, 2014, followed carefully by Dr. Arbie Cookey with consideration for possible endarterectomy  when overall medical status stabilizes    PSH:    Past Surgical History  Procedure Laterality Date  . Coronary artery bypass graft  2003     LIMA, LAD, SVG to the OM, SVG right coronary artery  . Bladder tacking    . Salivary gland surgery    . Total abdominal hysterectomy    . Back surgery    . Breast surgery    . External ear surgery    .  Cardiac catheterization  04/15/12    60-70% proximal LAD, 95% mid AV groove circumflex s/p PTCA, proximal RCA occlusion, patent LIMA to LAD, patent SVG to distal RCA, chronically occluded SVG to circumflex; LVEF 25-30%    ALLERGIES:   Atorvastatin; Citrus; Iodine; Iohexol; Zolpidem tartrate; and Milk-related compounds  Prior to Admit Meds:   (Not in a hospital admission) Family HX:    Family History  Problem Relation Age of Onset  . Heart disease Other   . Heart disease Mother   . Hypertension Mother   . Heart disease Father   . Hypertension Father    Social HX:    History   Social History  . Marital Status: Divorced    Spouse Name: N/A    Number of Children: N/A  . Years of Education: N/A   Occupational History  . Retired    Social History Main Topics  . Smoking status: Never Smoker   . Smokeless tobacco: Never Used  . Alcohol Use: No  . Drug Use: No  . Sexually Active: Not Currently   Other Topics Concern   . Not on file   Social History Narrative  . No narrative on file     ROS:  All 11 ROS were addressed and are negative except what is stated in the HPI  PHYSICAL EXAM Filed Vitals:   06/26/12 1130  BP: 146/60  Pulse: 73  Resp: 21   General: Well developed, well nourished, in no acute distress Head:    Normal cephalic and atramatic  Lungs:   Clear bilaterally to auscultation and percussion. Heart: HRRR S1 S2  Abdomen: abdomen soft and non-tender  Msk:  Normal strength and tone for age. Extremities: Trace  edema.  DP +1 Neuro: Alert and oriented X 3. Psych:  normal  affect, responds appropriately   Labs:   Lab Results  Component Value Date   WBC 12.3* 06/01/2012   HGB 14.6 06/26/2012   HCT 43.0 06/26/2012   MCV 86.5 06/01/2012   PLT 264 06/01/2012    Recent Labs Lab 06/26/12 0853  NA 139  K 4.4  CL 110  BUN 21  CREATININE 0.70  GLUCOSE 324*   Lab Results  Component Value Date   CKTOTAL 47 04/15/2012   CKMB 2.8 04/15/2012   TROPONINI <0.30 05/28/2012   No results found for this basename: PTT   Lab Results  Component Value Date   INR 1.93* 04/15/2012   INR 0.99 04/15/2012   INR 1.09 08/20/2009     Lab Results  Component Value Date   CHOL 218* 04/16/2012   CHOL 177 03/15/2012   CHOL  Value: 168        ATP III CLASSIFICATION:  <200     mg/dL   Desirable  161-096  mg/dL   Borderline High  >=045    mg/dL   High        4/0/9811   Lab Results  Component Value Date   HDL 34* 04/16/2012   HDL 34* 03/15/2012   HDL 31* 08/05/2011   Lab Results  Component Value Date   LDLCALC UNABLE TO CALCULATE IF TRIGLYCERIDE OVER 400 mg/dL 10/04/7827   LDLCALC Comment:   Not calculated due to Triglyceride >400. Suggest ordering Direct LDL (Unit Code: 56213).   Total Cholesterol/HDL Ratio:CHD Risk                        Coronary Heart Disease Risk Table  Men       Women          1/2 Average Risk              3.4        3.3              Average Risk               5.0        4.4           2X Average Risk              9.6        7.1           3X Average Risk             23.4       11.0 Use the calculated Patient Ratio above and the CHD Risk table  to determine the patient's CHD Risk. ATP III Classification (LDL):       < 100        mg/dL         Optimal      454 - 129     mg/dL         Near or Above Optimal      130 - 159     mg/dL         Borderline High      160 - 189     mg/dL         High       > 098        mg/dL         Very High   02/08/1476   LDLCALC 215 08/05/2011   Lab Results  Component Value Date   TRIG 555* 04/16/2012   TRIG 680* 03/15/2012   TRIG 651* 08/05/2011   Lab Results  Component Value Date   CHOLHDL 6.4 04/16/2012   CHOLHDL 5.2 03/15/2012   CHOLHDL 4.9 08/20/2009   No results found for this basename: LDLDIRECT      Radiology:  @[id number] @  EKG:  Normal sinus rhythm with septal Q waves  And aneurysmal ST segment changes anteriorly.  Lateral ST segment depression   ASSESSMENT: Unstable angina in a patient with known coronary artery disease.    PLAN:  Symptoms have been improving over the past week.  She has sudden onset of discomfort.  Will admit to step down.  Cycle enzymes.  Watch on telemetry.  Start IV heparin.   Continue aggressive secondary prevention including dual antiplatelet therapy, beta blocker.   Decisions for further workup including cardiac cath will depend on her enzymes.  Will try medical therapy for angina.  Continue Imdur.  We'll hold off on IV nitroglycerin at this time since she is currently pain-free.    Hyperlipidemia.  Continue fish oil and Fina fiber 8.  Triglycerides have been quite elevated.    Corky Crafts., MD  06/26/2012  12:12 PM

## 2012-06-26 NOTE — ED Provider Notes (Signed)
History     CSN: [id number]  Arrival date & time 06/26/12  4098   First MD Initiated Contact with Patient 06/26/12 [phone]     Level 5 Chief Complaint  Patient presents with  . Code STEMI    (Consider location/radiation/quality/duration/timing/severity/associated sxs/prior treatment) HPI Patient presents as STEMI from Patient reports that pain began on awakening this a.m.   She took nitro x 3 and was given aspirin by ems Pain was 8-9 nown 4/10.  Pain in midback like previous stemi with radiation to left arm  Patient with some dyspnea.  Patient had similar symptoms in March but unable to have stent placed.  Patient states taking all meds as prescribed.  PMD Dr. Jeanice Lim cardiologist Dr. Myrtis Ser.  Patient was called as code stemi and reportedly cardiologist and cath lab en route.   Past Medical History  Diagnosis Date  . Hypertension     Unspecified  . Hyperlipidemia     Mixed  . CAD (coronary artery disease) 08/21/09    a. Catheterization 2006 occluded OM vein graft b.   nuclear 2008 question slight ischemia c. anterior MI 04/15/12 s/p PTCA-LCx  . S/P CABG (coronary artery bypass graft) 2003     2003  LIMA, LAD, SVG to the OM, SVG right coronary artery  . Aneurysm 2003    Ascending thoracic aneurysm repair with a graft at time of CABG  . Diabetes mellitus     Insulin dependent  . Gastroparesis   . Depression   . Allergy history, radiographic dye     Contrast dye allergy  . Ejection fraction     EF 50%  //  Ejection fraction 25-30% at the time of stress echo,  May, 2013  . Previous back surgery   . Syncope 2008    Question?  2008  . Statin intolerance   . Shortness of breath     Exertional shortness of breath, May, 2013  . Systolic CHF     June, 2013  . Ischemic cardiomyopathy 06/2011    a. Echo 04/19/12: EF 25-30%, mod LVH, diffuse HK, trivial AI  . Thyroid disease   . CHF (congestive heart failure)   . Nausea     October, 2013  . Hypotension     October, 2013  .  Hypothyroidism     Treated with low-dose Synthroid  . CKD (chronic kidney disease)   . Carotid artery disease     Stenosis greater than 80%, asymptomatic, April, 2014, followed carefully by Dr. Arbie Cookey with consideration for possible endarterectomy  when overall medical status stabilizes    Past Surgical History  Procedure Laterality Date  . Coronary artery bypass graft  2003     LIMA, LAD, SVG to the OM, SVG right coronary artery  . Bladder tacking    . Salivary gland surgery    . Total abdominal hysterectomy    . Back surgery    . Breast surgery    . External ear surgery    . Cardiac catheterization  04/15/12    60-70% proximal LAD, 95% mid AV groove circumflex s/p PTCA, proximal RCA occlusion, patent LIMA to LAD, patent SVG to distal RCA, chronically occluded SVG to circumflex; LVEF 25-30%    Family History  Problem Relation Age of Onset  . Heart disease Other   . Heart disease Mother   . Hypertension Mother   . Heart disease Father   . Hypertension Father     History  Substance Use Topics  . Smoking  status: Never Smoker   . Smokeless tobacco: Never Used  . Alcohol Use: No    OB History   Grav Para Term Preterm Abortions TAB SAB Ect Mult Living                  Review of Systems  All other systems reviewed and are negative.    Allergies  Atorvastatin; Citrus; Iodine; Iohexol; Zolpidem tartrate; and Milk-related compounds  Home Medications   Current Outpatient Rx  Name  Route  Sig  Dispense  Refill  . aspirin EC 81 MG tablet   Oral   Take 81 mg by mouth every morning.         . carvedilol (COREG) 6.25 MG tablet   Oral   Take 1 tablet (6.25 mg total) by mouth 2 (two) times daily with a meal.   60 tablet   1   . cephALEXin (KEFLEX) 500 MG capsule   Oral   Take 1 capsule (500 mg total) by mouth 2 (two) times daily.   14 capsule   0     Please deliver to pt home   . clopidogrel (PLAVIX) 75 MG tablet   Oral   Take 75 mg by mouth every morning.          . diphenhydrAMINE (SOMINEX) 25 MG tablet   Oral   Take 25 mg by mouth at bedtime as needed for sleep.         . fenofibrate 160 MG tablet   Oral   Take 1 tablet (160 mg total) by mouth daily.   30 tablet   6     New 06/09/2012   . fluconazole (DIFLUCAN) 100 MG tablet   Oral   Take 1 tablet (100 mg total) by mouth daily. For yeast   7 tablet   0   . fluticasone (FLONASE) 50 MCG/ACT nasal spray   Nasal   Place 1 spray into the nose daily as needed. For allergies         . folic acid (FOLVITE) 1 MG tablet   Oral   Take 1 mg by mouth every morning.          . furosemide (LASIX) 40 MG tablet   Oral   Take 1 tablet (40 mg total) by mouth daily.   60 tablet   0   . HYDROcodone-acetaminophen (NORCO/VICODIN) 5-325 MG per tablet   Oral   Take 1 tablet by mouth daily as needed for pain.          [name] insulin glargine (LANTUS) 100 UNIT/ML injection   Subcutaneous   Inject into the skin 2 (two) times daily. Take 45 units in AM, and 50 units at bedtime         . insulin lispro (HUMALOG) 100 UNIT/ML injection               . isosorbide mononitrate (IMDUR) 30 MG 24 hr tablet   Oral   Take 30 mg by mouth every morning.          [name] levothyroxine (LEVOTHROID) 25 MCG tablet   Oral   Take 1 tablet (25 mcg total) by mouth daily.   30 tablet   3     New Dose of thyroid   . meclizine (ANTIVERT) 25 MG tablet   Oral   Take 25 mg by mouth 3 (three) times daily as needed for dizziness.         . nitroGLYCERIN (NITROSTAT) 0.4 MG SL  tablet   Sublingual   Place 0.4 mg under the tongue every 5 (five) minutes as needed for chest pain. May repeat times three.         . omega-3 acid ethyl esters (LOVAZA) 1 G capsule   Oral   Take 2 capsules (2 g total) by mouth 2 (two) times daily.   60 capsule   6   . promethazine (PHENERGAN) 25 MG tablet      TAKE 1 TABLET EVERY 4 TO 6 HOURS AS NEEDED FOR NAUSEA AND VOMITING.   30 tablet   3   . ramipril (ALTACE) 2.5  MG capsule   Oral   Take 1 capsule (2.5 mg total) by mouth 2 (two) times daily.   60 capsule   6     Dose changed 06/09/2012 PLEASE DISREGARD PREVIOUS  ...     There were no vitals taken for this visit.  Physical Exam  Nursing note and vitals reviewed. Constitutional: She is oriented to person, place, and time. She appears well-developed and well-nourished.  HENT:  Head: Normocephalic and atraumatic.  Right Ear: External ear normal.  Left Ear: External ear normal.  Nose: Nose normal.  Mouth/Throat: Oropharynx is clear and moist.  Eyes: Conjunctivae are normal. Pupils are equal, round, and reactive to light.  Neck: Normal range of motion. Neck supple.  Cardiovascular: Normal rate, regular rhythm, normal heart sounds and intact distal pulses.   Pulmonary/Chest: Effort normal and breath sounds normal.  Abdominal: Soft. Bowel sounds are normal.  Musculoskeletal: Normal range of motion. She exhibits edema. She exhibits no tenderness.  Neurological: She is alert and oriented to person, place, and time.  Skin: Skin is warm and dry.  Psychiatric: She has a normal mood and affect. Her behavior is normal. Thought content normal.    ED Course  Procedures (including critical care time)  Labs Reviewed - No data to display No results found.   No diagnosis found.    MDM   Date: 06/26/2012  Rate: 82  Rhythm: normal sinus rhythm  QRS Axis: left  Intervals: normal  ST/T Wave abnormalities: ST elevations anteriorly  Conduction Disutrbances:left bundle branch block  Narrative Interpretation:   Old EKG Reviewed: unchanged     Dg Chest Port 1 View  06/26/2012   *RADIOLOGY REPORT*  Clinical Data: Chest pain  PORTABLE CHEST - 1 VIEW  Comparison: Prior chest x-Janaisha Tolsma 05/27/2012  Findings: Stable appearance of the chest with cardiomegaly and mild left atrial appendage enlargement.  The patient status post median sternotomy with evidence of prior multivessel CABG including LIMA bypass.   Atherosclerotic calcifications noted in the transverse aorta.  There may be trace bibasilar subsegmental atelectasis as seen on prior studies.  No pulmonary edema, pneumothorax, large effusion or focal airspace consolidation.  Advanced degenerative changes of bilateral glenohumeral joints consistent with osteoarthritis.  No acute osseous abnormality.  IMPRESSION:  No acute cardiopulmonary disease, stable chest x-Sammy Douthitt.   Original Report Authenticated By: Malachy Moan, M.D.   Dg Chest Portable 1 View  05/27/2012   *RADIOLOGY REPORT*  Clinical Data: Loss of consciousness.  PORTABLE CHEST - 1 VIEW  Comparison: 05/20/2012  Findings: The patient is status post median sternotomy CABG procedure.  There is a moderate cardiac enlargement.  There is no pleural effusion or edema identified.  No airspace consolidation noted.  Advanced changes of osteoarthritis involve both glenohumeral joints.  IMPRESSION:  1.  No acute findings.   Original Report Authenticated By: Signa Kell, M.D.   Patient  hemodynamically stable   Patient with ekg unchanged from prior.     Discussed with Dr. Kary Kos 11:14 AM Who saw patient and discontinued code stemi on patient's arrival.  Patient in ed - Labs back and normal except hyperglycemia.  Plan insulin.  Patient with pain at 1-2 after ms.  Discussed with Dr. Isabel Caprice and they will see to admit.   Hilario Quarry, MD 06/26/12 1116

## 2012-06-26 NOTE — ED Notes (Signed)
Patient requested and received a Happy Meal and a Sprite.

## 2012-06-26 NOTE — ED Notes (Signed)
Pt reports woke up with pain in between B/L shoulder blades. Pt took 3 Nitro and 1 Hydrocodone at home. Pt given 324 mg of ASA by EMS. Pt reports pain in center of back that radiates down Left arm. Pain accompained by shortness of breath, nausea and lightheadedness. CBG 364 for EMS.

## 2012-06-26 NOTE — Assessment & Plan Note (Signed)
BP looks good today, continue current medications 

## 2012-06-26 NOTE — Progress Notes (Signed)
ANTICOAGULATION CONSULT NOTE - Follow Up Consult  Pharmacy Consult for heparin Indication: chest pain/ACS  Labs:  Recent Labs  06/26/12 0853 06/26/12 1608 06/26/12 2143  HGB 14.6  --   --   HCT 43.0  --   --   APTT  --  29  --   LABPROT  --  13.0  --   INR  --  0.99  --   HEPARINUNFRC  --   --  0.11*  CREATININE 0.70  --   --   TROPONINI  --  <0.30 0.40*    Assessment: 79yo female subtherapeutic on heparin with initial dosing for CP though noted that IV line had infiltrated, likely that drug still being absorbed from site; initial troponin negative, now slightly elevated.  Goal of Therapy:  Heparin level 0.3-0.7 units/ml   Plan:  Will rebolus with heparin 2000 units x1 and increase gtt by ~2-3 units/kg/hr to 1200 units/h and check level in 8hr.  Vernard Gambles, PharmD, BCPS  06/26/2012,11:40 PM

## 2012-06-26 NOTE — Assessment & Plan Note (Signed)
No pathological nodes seen on CT of neck, yet pt continues to have pain and swelling over nodes Will treat with course of antibiotics, she has ENT appt set up for the 19th if this does not improve I see no signs of ear infection, no superficial cellulitis, no oral lesions Other differentials- TMJ, MSK from neck

## 2012-06-27 DIAGNOSIS — I200 Unstable angina: Secondary | ICD-10-CM

## 2012-06-27 LAB — BASIC METABOLIC PANEL
CO2: 26 mEq/L (ref 19–32)
GFR calc non Af Amer: 63 mL/min — ABNORMAL LOW (ref >90)
Glucose, Bld: 231 mg/dL — ABNORMAL HIGH (ref 70–99)
Potassium: 3.9 mEq/L (ref 3.5–5.1)
Sodium: 139 mEq/L (ref 135–145)

## 2012-06-27 LAB — URINALYSIS, ROUTINE W REFLEX MICROSCOPIC
Glucose, UA: >1000 mg/dL — AB
Ketones, ur: NEGATIVE mg/dL
Protein, ur: NEGATIVE mg/dL

## 2012-06-27 LAB — CBC
HCT: 41 % (ref 36.0–46.0)
Hemoglobin: 13.6 g/dL (ref 12.0–15.0)
MCH: 29.3 pg (ref 26.0–34.0)
MCHC: 33.2 g/dL (ref 30.0–36.0)

## 2012-06-27 LAB — LIPID PANEL
HDL: 32 mg/dL — ABNORMAL LOW (ref >39)
LDL Cholesterol: 86 mg/dL (ref 0–99)
Triglycerides: 351 mg/dL — ABNORMAL HIGH (ref <150)

## 2012-06-27 LAB — URINE MICROSCOPIC-ADD ON

## 2012-06-27 LAB — GLUCOSE, CAPILLARY: Glucose-Capillary: 328 mg/dL — ABNORMAL HIGH (ref 70–99)

## 2012-06-27 LAB — TROPONIN I: Troponin I: 0.54 ng/mL (ref <0.30)

## 2012-06-27 MED ORDER — HEPARIN BOLUS VIA INFUSION
2000.0000 [IU] | Freq: Once | INTRAVENOUS | Status: AC
  Administered 2012-06-27: 2000 [IU] via INTRAVENOUS
  Filled 2012-06-27: qty 2000

## 2012-06-27 MED ORDER — CARVEDILOL 12.5 MG PO TABS
12.5000 mg | ORAL_TABLET | Freq: Two times a day (BID) | ORAL | Status: DC
  Administered 2012-06-27 – 2012-07-02 (×10): 12.5 mg via ORAL
  Filled 2012-06-27 (×12): qty 1

## 2012-06-27 MED ORDER — HEPARIN BOLUS VIA INFUSION
1000.0000 [IU] | Freq: Once | INTRAVENOUS | Status: AC
  Administered 2012-06-27: 1000 [IU] via INTRAVENOUS
  Filled 2012-06-27: qty 1000

## 2012-06-27 MED ORDER — ISOSORBIDE MONONITRATE ER 60 MG PO TB24
60.0000 mg | ORAL_TABLET | Freq: Every morning | ORAL | Status: DC
  Administered 2012-06-27 – 2012-07-02 (×6): 60 mg via ORAL
  Filled 2012-06-27 (×6): qty 1

## 2012-06-27 NOTE — Progress Notes (Signed)
SUBJECTIVE:  Chest pain free since getting morphine.  No SOB   PHYSICAL EXAM Filed Vitals:   06/27/12 0500 06/27/12 0600 06/27/12 0700 06/27/12 0826  BP: 152/57 134/58 146/70 144/97  Pulse: 87 87 84 84  Temp:    98 F (36.7 C)  TempSrc:    Oral  Resp: 18 23 19 24   Height:      Weight:      SpO2: 89% 92% 98% 93%   General:  No distress HEENT:  PERRL Lungs:  Clear Heart:  RRR Abdomen:  Positive bowel sounds, no rebound no guarding Extremities:  No edema Neuro:  Nonfocal  LABS: Lab Results  Component Value Date   TROPONINI 0.54* 06/27/2012   Results for orders placed during the hospital encounter of 06/26/12 (from the past 24 hour(s))  GLUCOSE, CAPILLARY     Status: Abnormal   Collection Time    06/26/12  2:53 PM      Result Value Range   Glucose-Capillary 274 (*) 70 - 99 mg/dL   Comment 1 Notify RN     Comment 2 Documented in Chart    PROTIME-INR     Status: None   Collection Time    06/26/12  4:08 PM      Result Value Range   Prothrombin Time 13.0  11.6 - 15.2 seconds   INR 0.99  0.00 - 1.49  APTT     Status: None   Collection Time    06/26/12  4:08 PM      Result Value Range   aPTT 29  24 - 37 seconds  HEMOGLOBIN A1C     Status: Abnormal   Collection Time    06/26/12  4:08 PM      Result Value Range   Hemoglobin A1C 8.4 (*) <5.7 %   Mean Plasma Glucose 194 (*) <117 mg/dL  TROPONIN I     Status: None   Collection Time    06/26/12  4:08 PM      Result Value Range   Troponin I <0.30  <0.30 ng/mL  GLUCOSE, CAPILLARY     Status: Abnormal   Collection Time    06/26/12  4:48 PM      Result Value Range   Glucose-Capillary 265 (*) 70 - 99 mg/dL  GLUCOSE, CAPILLARY     Status: Abnormal   Collection Time    06/26/12  9:37 PM      Result Value Range   Glucose-Capillary 252 (*) 70 - 99 mg/dL  HEPARIN LEVEL (UNFRACTIONATED)     Status: Abnormal   Collection Time    06/26/12  9:43 PM      Result Value Range   Heparin Unfractionated 0.11 (*) 0.30 - 0.70  IU/mL  TROPONIN I     Status: Abnormal   Collection Time    06/26/12  9:43 PM      Result Value Range   Troponin I 0.40 (*) <0.30 ng/mL  CBC     Status: Abnormal   Collection Time    06/27/12  2:41 AM      Result Value Range   WBC 12.0 (*) 4.0 - 10.5 K/uL   RBC 4.64  3.87 - 5.11 MIL/uL   Hemoglobin 13.6  12.0 - 15.0 g/dL   HCT 11.9  14.7 - 82.9 %   MCV 88.4  78.0 - 100.0 fL   MCH 29.3  26.0 - 34.0 pg   MCHC 33.2  30.0 - 36.0 g/dL   RDW 14.3  11.5 - 15.5 %   Platelets 200  150 - 400 K/uL  TROPONIN I     Status: Abnormal   Collection Time    06/27/12  2:41 AM      Result Value Range   Troponin I 0.54 (*) <0.30 ng/mL  BASIC METABOLIC PANEL     Status: Abnormal   Collection Time    06/27/12  5:15 AM      Result Value Range   Sodium 139  135 - 145 mEq/L   Potassium 3.9  3.5 - 5.1 mEq/L   Chloride 104  96 - 112 mEq/L   CO2 26  19 - 32 mEq/L   Glucose, Bld 231 (*) 70 - 99 mg/dL   BUN 17  6 - 23 mg/dL   Creatinine, Ser 1.61  0.50 - 1.10 mg/dL   Calcium 9.0  8.4 - 09.6 mg/dL   GFR calc non Af Amer 63 (*) >90 mL/min   GFR calc Af Amer 73 (*) >90 mL/min  LIPID PANEL     Status: Abnormal   Collection Time    06/27/12  5:15 AM      Result Value Range   Cholesterol 188  0 - 200 mg/dL   Triglycerides 045 (*) <150 mg/dL   HDL 32 (*) >40 mg/dL   Total CHOL/HDL Ratio 5.9     VLDL 70 (*) 0 - 40 mg/dL   LDL Cholesterol 86  0 - 99 mg/dL  HEPARIN LEVEL (UNFRACTIONATED)     Status: Abnormal   Collection Time    06/27/12  7:45 AM      Result Value Range   Heparin Unfractionated 0.20 (*) 0.30 - 0.70 IU/mL  GLUCOSE, CAPILLARY     Status: Abnormal   Collection Time    06/27/12  8:25 AM      Result Value Range   Glucose-Capillary 225 (*) 70 - 99 mg/dL    Intake/Output Summary (Last 24 hours) at 06/27/12 0936 Last data filed at 06/27/12 0800  Gross per 24 hour  Intake 527.56 ml  Output   3300 ml  Net -2772.44 ml    EKG:  NSR, rate 67, T wave inversion in the inferior leads new  since ECG yesterday.  06/27/2012  ASSESSMENT AND PLAN:  NQWMI:  Known CAD.  Slight enzyme elevation.  I reviewed the cath films from earlier this year.  This is a difficult situation. She has severe distal vessel and branch vessel disease that could be causing symptoms and her NQWMI.  This is not amenable to revascularization.  However, there is also a very large unprotected RI in which there was a failed PCI in March that has high grade stenosis and serves a large territory.  This could be the culprit.  My suggestion would be to maximize the meds (she could probably tolerate higher dose beta blocker and Imdur) and only consider repeat cath with continued symptoms. I would ask Drs Excell Seltzer and [place] to review the previous films in the AM.   I will increase beta blocker and Imdur.   HTN:  Treat it in the context of treating the above.   DM:  8.4 A1c..  Continue current insulin and close follow up with primary MD.   ISCHEMIC CARDIOMYOPATHY:   She seems to be euvolemic.  Med changes as above.    Rollene Rotunda 06/27/2012 9:36 AM

## 2012-06-27 NOTE — Progress Notes (Signed)
Report from Night RN. Chart reviewed together. Handoff complete.Introductions complete. Will continue to monitor and advise attending as needed.   

## 2012-06-27 NOTE — Progress Notes (Signed)
ANTICOAGULATION CONSULT NOTE - Follow Up Consult  Pharmacy Consult for Heparin Indication: chest pain/ACS  Allergies  Allergen Reactions  . Atorvastatin Other (See Comments)    Unknown-patient admitted to hospital after taking  . Citrus Dermatitis    BREAK OUT ON BODY  . Contrast Media (Iodinated Diagnostic Agents)   . Iohexol      Desc: ANAPHYLAXIS   . Zolpidem Tartrate     REACTION: hallucinations  . Milk-Related Compounds Rash    Patient Measurements: Height: 5\' 5"  (165.1 cm) Weight: 189 lb 2.5 oz (85.8 kg) IBW/kg (Calculated) : 57 Heparin Dosing Weight: 75kg  Vital Signs: Temp: 98.8 F (37.1 C) (06/08 1400) Temp src: Oral (06/08 1400) BP: 139/58 mmHg (06/08 1652) Pulse Rate: 80 (06/08 1215)  Labs:  Recent Labs  06/26/12 0853 06/26/12 1608 06/26/12 2143 06/27/12 0241 06/27/12 0515 06/27/12 0745 06/27/12 1715  HGB 14.6  --   --  13.6  --   --   --   HCT 43.0  --   --  41.0  --   --   --   PLT  --   --   --  200  --   --   --   APTT  --  29  --   --   --   --   --   LABPROT  --  13.0  --   --   --   --   --   INR  --  0.99  --   --   --   --   --   HEPARINUNFRC  --   --  0.11*  --   --  0.20* 0.24*  CREATININE 0.70  --   --   --  0.85  --   --   TROPONINI  --  <0.30 0.40* 0.54*  --   --   --     Estimated Creatinine Clearance: 57.1 ml/min (by C-G formula based on Cr of 0.85).   Medications:  Heparin 1350 units/hr  Assessment: 82yof on heparin for CP/ACS. Heparin level (0.24) is subtherapeutic despite rate increase - will rebolus and increase heparin rate. No problems with line or infusion per RN. - H/H and plts wnl - No significant bleeding reported  Goal of Therapy:  Heparin level 0.3-0.7 units/ml Monitor platelets by anticoagulation protocol: Yes   Plan:  1. Heparin IV bolus 1000 units x 1 2. Increase heparin drip to 1500 units/hr (15 ml/hr) 3. Follow-up AM heparin level  Cleon Dew [phone] 06/27/2012,6:46 PM

## 2012-06-27 NOTE — Progress Notes (Signed)
ANTICOAGULATION CONSULT NOTE - Follow Up Consult  Pharmacy Consult for heparin Indication: chest pain/ACS  Labs:  Recent Labs  06/26/12 0853 06/26/12 1608 06/26/12 2143 06/27/12 0241 06/27/12 0515 06/27/12 0745  HGB 14.6  --   --  13.6  --   --   HCT 43.0  --   --  41.0  --   --   PLT  --   --   --  200  --   --   APTT  --  29  --   --   --   --   LABPROT  --  13.0  --   --   --   --   INR  --  0.99  --   --   --   --   HEPARINUNFRC  --   --  0.11*  --   --  0.20*  CREATININE 0.70  --   --   --  0.85  --   TROPONINI  --  <0.30 0.40* 0.54*  --   --     Assessment: 79yo female subtherapeutic on heparin for CP though noted that IV line had infiltrated overnight. Initial troponin negative, now slightly elevated. CBC stable, no bleeding noted or per RN  Goal of Therapy:  Heparin level 0.3-0.7 units/ml   Plan:  Will rebolus with heparin 2000 units x1 and increase gtt by 2 units/kg/hr to 1350 units/h and check level in 8hr.  Vania Rea. Darin Engels.D. Clinical Pharmacist Pager [phone] Phone [phone] 06/27/2012 8:45 AM

## 2012-06-28 ENCOUNTER — Encounter (HOSPITAL_COMMUNITY)
Admission: EM | Disposition: A | Discharge status: Home / Self Care | Payer: Self-pay | Source: Home / Self Care | Attending: Cardiology

## 2012-06-28 DIAGNOSIS — I214 Non-ST elevation (NSTEMI) myocardial infarction: Secondary | ICD-10-CM

## 2012-06-28 LAB — BASIC METABOLIC PANEL
BUN: 19 mg/dL (ref 6–23)
CO2: 25 mEq/L (ref 19–32)
Chloride: 101 mEq/L (ref 96–112)
GFR calc non Af Amer: 62 mL/min — ABNORMAL LOW (ref >90)
Glucose, Bld: 287 mg/dL — ABNORMAL HIGH (ref 70–99)
Potassium: 3.8 mEq/L (ref 3.5–5.1)

## 2012-06-28 LAB — GLUCOSE, CAPILLARY: Glucose-Capillary: 343 mg/dL — ABNORMAL HIGH (ref 70–99)

## 2012-06-28 LAB — HEPARIN LEVEL (UNFRACTIONATED)
Heparin Unfractionated: 0.28 IU/mL — ABNORMAL LOW (ref 0.30–0.70)
Heparin Unfractionated: 0.39 IU/mL (ref 0.30–0.70)

## 2012-06-28 LAB — CBC
HCT: 40 % (ref 36.0–46.0)
Hemoglobin: 13.5 g/dL (ref 12.0–15.0)
MCHC: 33.8 g/dL (ref 30.0–36.0)
RBC: 4.58 MIL/uL (ref 3.87–5.11)

## 2012-06-28 LAB — TROPONIN I: Troponin I: 0.53 ng/mL (ref <0.30)

## 2012-06-28 SURGERY — LEFT HEART CATHETERIZATION WITH CORONARY ANGIOGRAM
Anesthesia: LOCAL

## 2012-06-28 NOTE — Progress Notes (Signed)
ANTICOAGULATION CONSULT NOTE - Follow Up Consult  Pharmacy Consult for heparin Indication: chest pain/ACS  Labs:  Recent Labs  06/26/12 0853 06/26/12 1608  06/26/12 2143 06/27/12 0241 06/27/12 0515 06/27/12 0745 06/27/12 1715 06/28/12 0350  HGB 14.6  --   --   --  13.6  --   --   --   --   HCT 43.0  --   --   --  41.0  --   --   --   --   PLT  --   --   --   --  200  --   --   --   --   APTT  --  29  --   --   --   --   --   --   --   LABPROT  --  13.0  --   --   --   --   --   --   --   INR  --  0.99  --   --   --   --   --   --   --   HEPARINUNFRC  --   --   < > 0.11*  --   --  0.20* 0.24* 0.28*  CREATININE 0.70  --   --   --   --  0.85  --   --   --   TROPONINI  --  <0.30  --  0.40* 0.54*  --   --   --   --   < > = values in this interval not displayed.  Assessment: 79yo female remains subtherapeutic on heparin despite rate increases with little change in level after each increase.  Goal of Therapy:  Heparin level 0.3-0.7 units/ml   Plan:  Will increase gtt by ~2 units/kg/hr to 1700 units/h and check level in 6hr.  Vernard Gambles, PharmD, BCPS  06/28/2012,4:47 AM

## 2012-06-28 NOTE — Progress Notes (Addendum)
Pt up to bsc.  States while she was up she had some chest pain lasting just several seconds.  Pt no distress. Pt pain free at present.  Continue to monitor.

## 2012-06-28 NOTE — Care Management Note (Addendum)
    Page 1 of 2   07/09/2012     3:39:13 PM   CARE MANAGEMENT NOTE 07/09/2012  Patient:  Cathy [name], Cathy [name]   Account Number:  [id number]  Date Initiated:  06/28/2012  Documentation initiated by:  Junius Creamer  Subjective/Objective Assessment:   adm w mi     Action/Plan:   lives alone, pcp dr Riesa Pope   Anticipated DC Date:  07/04/2012   Anticipated DC Plan:  HOME W HOME HEALTH SERVICES      DC Planning Services  CM consult  Medication Assistance      [facility] Choice  Resumption Of Svcs/PTA Provider   Choice offered to / List presented to:  C-1 Patient        HH arranged  HH-1 RN  HH-6 SOCIAL WORKER  HH-2 PT  HH-4 NURSE'S AIDE      HH agency  [facility]   Status of service:  Completed, signed off Medicare Important Message given?   (If response is "NO", the following Medicare IM given date fields will be blank) Date Medicare IM given:   Date Additional Medicare IM given:    Discharge Disposition:  HOME W HOME HEALTH SERVICES  Per UR Regulation:  Reviewed for med. necessity/level of care/duration of stay  If discussed at Long Length of Stay Meetings, dates discussed:    Comments:  Contact:  Ronn Melena [phone]   Raechel Ache Niece [phone]   [phone]   Odessa Fleming [phone]  07/09/12 Olanna Percifield,RN,BSN [phone] PT FOR DC HOME TODAY; NORMALLY LIVES ALONE, BUT STATES NIECE TO STAY WITH HER FOR A WHILE AT DC.  PT ACTIVE WITH [facility] PRIOR TO ADMISSION.  WILL RESUME HH SERVICES UPON DC. [facility] NOTIFIED OF DC DATE.  PT STARTING [facility]--FREE 30 DAY TRIAL CARD GIVEN TO PT, AND CONFIRMED THAT PT'S PHARMACY, [facility], DOES HAVE DRUG IN STOCK.  6/11 1338 debbie dowell rn,bsn pt has 50% copay for ranexa and needs prior auth at [phone] or can fax in prior auth form which placed in shadow chart if md prefers. placed pt assist form in chart also for ranexa.  pt in donut hole so paying for meds out of pocket. gave her pt assist  forms for ins-lovaza-tricor. alerted her to meds that are on 4.00 list at [facility] and [facility]. asked sw from [facility] to help pt w forms. she was getting hhrn and sw w [facility] pta.

## 2012-06-28 NOTE — Progress Notes (Signed)
Inpatient Diabetes Program Recommendations  AACE/ADA: New Consensus Statement on Inpatient Glycemic Control (2013)  Target Ranges:  Prepandial:   less than 140 mg/dL      Peak postprandial:   less than 180 mg/dL (1-2 hours)      Critically ill patients:  140 - 180 mg/dL   Reason for Assessment: Sub-optimal glycemic control  Inpatient Diabetes Program Recommendations Insulin - Meal Coverage: Per Med Rec, patient took Humalog insulin 15 units bid with meals.  Please consider ordering Novolog 10 units tid with meals to be given  as long as patient eats at least 50% of meal and CBG is at least 80 mg/dl., HgbA1C: Hbg A1C is 8.4 indicating mean glucose of 194 mg/dl. Diet: Current diet order is for Heart Healthy.  Please consider adding CHO Modified Medium restriction to diet order provided it is compatible with goals of care.    Note:  Results for Cathy [name], Cathy [name] (MRN [id number]) as of 06/28/2012 13:27  Ref. Range 05/30/2012 07:19 05/30/2012 11:09 06/26/2012 14:53 06/26/2012 16:48 06/26/2012 21:37 06/27/2012 08:25 06/27/2012 12:18 06/27/2012 17:01 06/27/2012 21:20 06/28/2012 08:30  Glucose-Capillary Latest Range: 70-99 mg/dL 409 (H) 811 (H) 914 (H) 265 (H) 252 (H) 225 (H) 250 (H) 328 (H) 322 (H) 219 (H)   Please see above recommendations.  Thank you.  Cathy Cutright S. Elsie Lincoln, RN, CNS, CDE Inpatient Diabetes Program, team pager [phone]

## 2012-06-28 NOTE — Progress Notes (Signed)
Patient ID: Cathy [name], female   DOB: 1930-03-23, 79 y.o.   MRN: [id number]   SUBJECTIVE:  The patient feels better today. She has been having chest discomfort at home. With her current admission her first troponin was 0.54. No repeat troponins have been done and these will be ordered. In March, 2014, the patient had a very long procedure by Dr. Allyson Sabal. Because of tortuous anatomy he was not able to stent the culprit lesion. At that time we decided it would be best to follow her clinically. She also has other diffuse distal disease that could be causing her problems. We will review the films further to see if another attempt should be made.   Filed Vitals:   06/28/12 0133 06/28/12 0819 06/28/12 1025 06/28/12 1152  BP: 137/71 158/62 151/55 110/59  Pulse: 92     Temp: 97.6 F (36.4 C) 98.1 F (36.7 C)  98 F (36.7 C)  TempSrc:  Oral  Oral  Resp: 22 18 18 18   Height:      Weight:      SpO2: 93% 97%  96%    Intake/Output Summary (Last 24 hours) at 06/28/12 1202 Last data filed at 06/28/12 1100  Gross per 24 hour  Intake 1429.93 ml  Output   3150 ml  Net -1720.07 ml    LABS: Basic Metabolic Panel:  Recent Labs  40/98/11 0515 06/28/12 0350  NA 139 136  K 3.9 3.8  CL 104 101  CO2 26 25  GLUCOSE 231* 287*  BUN 17 19  CREATININE 0.85 0.86  CALCIUM 9.0 9.6   Liver Function Tests: No results found for this basename: AST, ALT, ALKPHOS, BILITOT, PROT, ALBUMIN,  in the last 72 hours No results found for this basename: LIPASE, AMYLASE,  in the last 72 hours CBC:  Recent Labs  06/27/12 0241 06/28/12 0350  WBC 12.0* 9.8  HGB 13.6 13.5  HCT 41.0 40.0  MCV 88.4 87.3  PLT 200 218   Cardiac Enzymes:  Recent Labs  06/26/12 1608 06/26/12 2143 06/27/12 0241  TROPONINI <0.30 0.40* 0.54*   BNP: No components found with this basename: POCBNP,  D-Dimer: No results found for this basename: DDIMER,  in the last 72 hours Hemoglobin A1C:  Recent Labs  06/26/12 1608    HGBA1C 8.4*   Fasting Lipid Panel:  Recent Labs  06/27/12 0515  CHOL 188  HDL 32*  LDLCALC 86  TRIG 914*  CHOLHDL 5.9   Thyroid Function Tests: No results found for this basename: TSH, T4TOTAL, FREET3, T3FREE, THYROIDAB,  in the last 72 hours  RADIOLOGY: Ct Soft Tissue Neck Wo Contrast  05/29/2012   *RADIOLOGY REPORT*  Clinical Data: Swelling and redness of the left neck below the ear.  CT NECK WITHOUT CONTRAST  Technique:  Multidetector CT imaging of the neck was performed without intravenous contrast.  Comparison: MRI brain 03/29/2012.  CT head without contrast 09/20/2011.  Findings: No focal subcutaneous lesion is evident.  The parotid glands are within normal limits bilaterally.  The left submandibular gland is smaller than the right.  No focal lesions or duct dilation is evident.  Bilateral level I level II sub centimeter lymph nodes are present. No pathologically enlarged nodes are present.  No focal mucosal or submucosal lesions are present.  Vocal cords are midline and symmetric.  The thyroid is heterogeneous with enlargement of the left lobe extending inferiorly.  No dominant nodule is evident. Atherosclerotic calcifications are present at the aortic arch and carotid  bifurcations.  The patient is status post median sternotomy.  Limited imaging the brain is unremarkable.  The bone windows demonstrate to moderate to spondylosis with significant loss of disc height at C5-6 and C6-7.  Degenerative anterolisthesis is present at C4-5.  IMPRESSION:  1.  No acute or focal lesion explain redness of the left neck. This may represent a superficial cellulitis. 2.  No discrete mass lesion. 3.  Moderate spondylosis of the cervical spine as described. 4.  Heterogeneous appearance of the thyroid with enlargement of the left lobe.  No dominant nodule is evident. 5.  Decreased size of the left submandibular gland likely represents chronic atrophy.   Original Report Authenticated By: Marin [name],  M.D.   Dg Chest Port 1 View  06/26/2012   *RADIOLOGY REPORT*  Clinical Data: Chest pain  PORTABLE CHEST - 1 VIEW  Comparison: Prior chest x-ray 05/27/2012  Findings: Stable appearance of the chest with cardiomegaly and mild left atrial appendage enlargement.  The patient status post median sternotomy with evidence of prior multivessel CABG including LIMA bypass.  Atherosclerotic calcifications noted in the transverse aorta.  There may be trace bibasilar subsegmental atelectasis as seen on prior studies.  No pulmonary edema, pneumothorax, large effusion or focal airspace consolidation.  Advanced degenerative changes of bilateral glenohumeral joints consistent with osteoarthritis.  No acute osseous abnormality.  IMPRESSION:  No acute cardiopulmonary disease, stable chest x-ray.   Original Report Authenticated By: Malachy Moan, M.D.    PHYSICAL EXAM   Patient is oriented to person time and place. Affect is normal. There is no jugulovenous distention. Lungs reveal scattered rhonchi. Cardiac exam reveals S1 and S2. There no clicks or significant murmurs. Abdomen is soft. There is no peripheral edema.   TELEMETRY:  I have reviewed telemetry today June 28, 2012. There is normal sinus rhythm.   ASSESSMENT AND PLAN:    S/P CABG (coronary artery bypass graft)   Statin intolerance   DM (diabetes mellitus), type 2 with neurological complications    Cardiomyopathy, ischemic      Her ejection fraction is 25-30%. She is on appropriate medications but we have not pushed the doses of these medicines higher this admission. We need to push her antianginal meds.    Carotid artery disease      The patient has severe carotid disease. I have been working with Dr. Arbie Cookey. She is a candidate to have her carotids intervened upon. However this cannot be done until her coronary status is stable.    CAD (coronary artery disease)  Non-STEMI     I will check further enzymes. I will also speak with Dr. Excell Seltzer or Dr.  [place] about reviewing her films further. We need to decide if it is appropriate to proceed with repeat catheterization to see if anything else can be done for her. I will allow her to ambulate today to see if she has any discomfort.  [name] 06/28/2012 12:02 PM

## 2012-06-28 NOTE — Progress Notes (Signed)
ANTICOAGULATION CONSULT NOTE - Follow Up Consult  Pharmacy Consult for Heparin Indication: chest pain/ACS  Allergies  Allergen Reactions  . Atorvastatin Other (See Comments)    Unknown-patient admitted to hospital after taking  . Citrus Dermatitis    BREAK OUT ON BODY  . Contrast Media (Iodinated Diagnostic Agents)   . Iohexol      Desc: ANAPHYLAXIS   . Zolpidem Tartrate     REACTION: hallucinations  . Milk-Related Compounds Rash    Patient Measurements: Height: 5\' 5"  (165.1 cm) Weight: 189 lb 2.5 oz (85.8 kg) IBW/kg (Calculated) : 57 Heparin Dosing Weight: 75kg  Vital Signs: Temp: 98 F (36.7 C) (06/09 1152) Temp src: Oral (06/09 1152) BP: 110/59 mmHg (06/09 1152) Pulse Rate: 92 (06/09 0133)  Labs:  Recent Labs  06/26/12 0853 06/26/12 1608 06/26/12 2143 06/27/12 0241 06/27/12 0515  06/27/12 1715 06/28/12 0350 06/28/12 1125  HGB 14.6  --   --  13.6  --   --   --  13.5  --   HCT 43.0  --   --  41.0  --   --   --  40.0  --   PLT  --   --   --  200  --   --   --  218  --   APTT  --  29  --   --   --   --   --   --   --   LABPROT  --  13.0  --   --   --   --   --   --   --   INR  --  0.99  --   --   --   --   --   --   --   HEPARINUNFRC  --   --  0.11*  --   --   < > 0.24* 0.28* 0.39  CREATININE 0.70  --   --   --  0.85  --   --  0.86  --   TROPONINI  --  <0.30 0.40* 0.54*  --   --   --   --   --   < > = values in this interval not displayed.  Estimated Creatinine Clearance: 56.4 ml/min (by C-G formula based on Cr of 0.86).   Medications:  . sodium chloride 10 mL/hr at 06/28/12 0600  . heparin 1,700 Units/hr (06/28/12 0600)    Assessment: Cathy [name] on heparin for CP/ACS. Heparin level (0.39) is now therapeutic after rate increase.  Continuing heparin while awaiting decision on cath lab vs. Medical management. - H/H and plts wnl - No significant bleeding reported  Goal of Therapy:  Heparin level 0.3-0.7 units/ml Monitor platelets by anticoagulation  protocol: Yes   Plan:  1.  Continue IV heparin at current rate of 1700 units/hr. 2. Will check daily heparin level and CBC. 3. F/U plans for cath lab.  Tad Moore, BCPS  Clinical Pharmacist Pager [phone]  06/28/2012 1:08 PM

## 2012-06-29 LAB — URINE CULTURE

## 2012-06-29 LAB — CBC
MCH: 29.4 pg (ref 26.0–34.0)
MCHC: 33.5 g/dL (ref 30.0–36.0)
MCV: 87.6 fL (ref 78.0–100.0)
Platelets: 210 10*3/uL (ref 150–400)
RDW: 14.2 % (ref 11.5–15.5)

## 2012-06-29 LAB — GLUCOSE, CAPILLARY
Glucose-Capillary: 249 mg/dL — ABNORMAL HIGH (ref 70–99)
Glucose-Capillary: 241 mg/dL — ABNORMAL HIGH (ref 70–99)

## 2012-06-29 MED ORDER — INSULIN ASPART 100 UNIT/ML ~~LOC~~ SOLN
10.0000 [IU] | Freq: Three times a day (TID) | SUBCUTANEOUS | Status: DC
  Administered 2012-06-29 – 2012-07-06 (×19): 10 [IU] via SUBCUTANEOUS

## 2012-06-29 MED ORDER — RANOLAZINE ER 500 MG PO TB12
500.0000 mg | ORAL_TABLET | Freq: Two times a day (BID) | ORAL | Status: DC
  Administered 2012-06-29 – 2012-06-30 (×4): 500 mg via ORAL
  Filled 2012-06-29 (×6): qty 1

## 2012-06-29 NOTE — Progress Notes (Addendum)
Patient ID: Cathy [name], female   DOB: May 21, 1930, 79 y.o.   MRN: [id number]   SUBJECTIVE:  The patient has been stable during the night. She has not had any recurring chest pain. I will plan to add Ranexa to her medications. I had a long discussion with Dr. Excell Seltzer after he fully reviewed all of the films. The patient has multiple areas of possible ischemia. These areas cannot be approached with intervention. She has a very complex lesion in her circumflex. This could be approached but it is not totally clear that this would help her at this time. The plan will be to continue medical therapy. If she has unrelenting symptoms Dr. Excell Seltzer will approach the circumflex lesion.   Filed Vitals:   06/29/12 0200 06/29/12 0300 06/29/12 0354 06/29/12 0748  BP:   113/42 127/55  Pulse:   74 76  Temp:   97.9 F (36.6 C) 98.7 F (37.1 C)  TempSrc:    Other (Comment)  Resp: 18 16 20 21   Height:      Weight:      SpO2:   94% 95%    Intake/Output Summary (Last 24 hours) at 06/29/12 0918 Last data filed at 06/29/12 0700  Gross per 24 hour  Intake   1664 ml  Output   3325 ml  Net  -1661 ml    LABS: Basic Metabolic Panel:  Recent Labs  78/29/56 0515 06/28/12 0350  NA 139 136  K 3.9 3.8  CL 104 101  CO2 26 25  GLUCOSE 231* 287*  BUN 17 19  CREATININE 0.85 0.86  CALCIUM 9.0 9.6   Liver Function Tests: No results found for this basename: AST, ALT, ALKPHOS, BILITOT, PROT, ALBUMIN,  in the last 72 hours No results found for this basename: LIPASE, AMYLASE,  in the last 72 hours CBC:  Recent Labs  06/28/12 0350 06/29/12 0735  WBC 9.8 9.1  HGB 13.5 14.0  HCT 40.0 41.8  MCV 87.3 87.6  PLT 218 210   Cardiac Enzymes:  Recent Labs  06/26/12 2143 06/27/12 0241 06/28/12 1402  TROPONINI 0.40* 0.54* 0.53*   BNP: No components found with this basename: POCBNP,  D-Dimer: No results found for this basename: DDIMER,  in the last 72 hours Hemoglobin A1C:  Recent Labs  06/26/12 1608    HGBA1C 8.4*   Fasting Lipid Panel:  Recent Labs  06/27/12 0515  CHOL 188  HDL 32*  LDLCALC 86  TRIG 213*  CHOLHDL 5.9   Thyroid Function Tests: No results found for this basename: TSH, T4TOTAL, FREET3, T3FREE, THYROIDAB,  in the last 72 hours  RADIOLOGY: Dg Chest Port 1 View  06/26/2012   *RADIOLOGY REPORT*  Clinical Data: Chest pain  PORTABLE CHEST - 1 VIEW  Comparison: Prior chest x-ray 05/27/2012  Findings: Stable appearance of the chest with cardiomegaly and mild left atrial appendage enlargement.  The patient status post median sternotomy with evidence of prior multivessel CABG including LIMA bypass.  Atherosclerotic calcifications noted in the transverse aorta.  There may be trace bibasilar subsegmental atelectasis as seen on prior studies.  No pulmonary edema, pneumothorax, large effusion or focal airspace consolidation.  Advanced degenerative changes of bilateral glenohumeral joints consistent with osteoarthritis.  No acute osseous abnormality.  IMPRESSION:  No acute cardiopulmonary disease, stable chest x-ray.   Original Report Authenticated By: Malachy Moan, M.D.    PHYSICAL EXAM  Patient is oriented to person time and place. Affect is normal. There is no jugulovenous distention. She's  lying flat in bed and comfortable. Lungs reveal scattered rhonchi. There is no respiratory distress. Cardiac exam reveals S1 and S2. There no clicks or significant murmurs. The abdomen is soft. There is no peripheral edema.   ASSESSMENT AND PLAN:    Statin intolerance    DM (diabetes mellitus), type 2 with neurological complications     I appreciate the help from the diabetes team. I will be adjusting the orders    Hypothyroidism      The patient tells me they're been ongoing issues about her thyroid. I will check her TSH. I will then work with her concerning any further outpatient followup that she needs.    Cardiomyopathy, ischemic     At this time we're using the meds that we are  able to use for her cardiomyopathy.    UTI (urinary tract infection)     The patient had some white cells and bacteria in her urinalysis. Urine culture is pending.    Carotid artery disease     Patient has severe carotid disease. We are watching to see her she can be stabilized medically before a carotid endarterectomy. We may have to consider whether or not stenting would be appropriate.    CAD (coronary artery disease)         Non-STEMI (non-ST elevated myocardial infarction)     I have outlined in the first sentence as above the plan to proceed with medical therapy at this point. I will increase her activity today. I am adding Ranexa. QT interval is not prolonged. It is on the high side but she has a wide QRS.   [name] 06/29/2012 9:18 AM

## 2012-06-29 NOTE — Progress Notes (Signed)
ANTICOAGULATION CONSULT NOTE - Follow Up Consult  Pharmacy Consult for Heparin Indication: chest pain/ACS  Allergies  Allergen Reactions  . Atorvastatin Other (See Comments)    Unknown-patient admitted to hospital after taking  . Citrus Dermatitis    BREAK OUT ON BODY  . Contrast Media (Iodinated Diagnostic Agents)   . Iohexol      Desc: ANAPHYLAXIS   . Zolpidem Tartrate     REACTION: hallucinations  . Milk-Related Compounds Rash    Patient Measurements: Height: 5\' 5"  (165.1 cm) Weight: 189 lb 2.5 oz (85.8 kg) IBW/kg (Calculated) : 57 Heparin Dosing Weight: 75kg  Vital Signs: Temp: 98.7 F (37.1 C) (06/10 0748) Temp src: Other (Comment) (06/10 0748) BP: 127/55 mmHg (06/10 0748) Pulse Rate: 76 (06/10 0748)  Labs:  Recent Labs  06/26/12 1608 06/26/12 2143  06/27/12 0241 06/27/12 0515  06/28/12 0350 06/28/12 1125 06/28/12 1402 06/29/12 0735  HGB  --   --   < > 13.6  --   --  13.5  --   --  14.0  HCT  --   --   --  41.0  --   --  40.0  --   --  41.8  PLT  --   --   --  200  --   --  218  --   --  210  APTT 29  --   --   --   --   --   --   --   --   --   LABPROT 13.0  --   --   --   --   --   --   --   --   --   INR 0.99  --   --   --   --   --   --   --   --   --   HEPARINUNFRC  --  0.11*  --   --   --   < > 0.28* 0.39  --  0.56  CREATININE  --   --   --   --  0.85  --  0.86  --   --   --   TROPONINI <0.30 0.40*  --  0.54*  --   --   --   --  0.53*  --   < > = values in this interval not displayed.  Estimated Creatinine Clearance: 56.4 ml/min (by C-G formula based on Cr of 0.86).   Medications:  . sodium chloride 10 mL/hr at 06/29/12 0600  . heparin 1,700 Units/hr (06/29/12 0824)    Assessment: 82yof on heparin for CP/ACS. Heparin level (0.56) is now therapeutic after rate increase.  Continuing heparin while awaiting decision on cath lab vs. Medical management. - H/H and plts wnl - No significant bleeding reported  Goal of Therapy:  Heparin level  0.3-0.7 units/ml Monitor platelets by anticoagulation protocol: Yes   Plan:  1. Continue IV heparin at current rate of 1700 units/hr. 2. Will check daily heparin level and CBC. 3. F/U plans for cath lab.  Tad Moore, BCPS  Clinical Pharmacist Pager [phone]  06/29/2012 8:55 AM

## 2012-06-30 ENCOUNTER — Encounter (HOSPITAL_COMMUNITY): Payer: Self-pay | Admitting: Physician Assistant

## 2012-06-30 LAB — BASIC METABOLIC PANEL
Calcium: 10.1 mg/dL (ref 8.4–10.5)
GFR calc non Af Amer: 54 mL/min — ABNORMAL LOW (ref >90)
Glucose, Bld: 195 mg/dL — ABNORMAL HIGH (ref 70–99)
Sodium: 139 mEq/L (ref 135–145)

## 2012-06-30 LAB — HEPARIN LEVEL (UNFRACTIONATED): Heparin Unfractionated: 0.35 IU/mL (ref 0.30–0.70)

## 2012-06-30 LAB — CBC
MCHC: 32.5 g/dL (ref 30.0–36.0)
RDW: 14.3 % (ref 11.5–15.5)

## 2012-06-30 LAB — TSH: TSH: 1.049 u[IU]/mL (ref 0.350–4.500)

## 2012-06-30 LAB — GLUCOSE, CAPILLARY: Glucose-Capillary: 193 mg/dL — ABNORMAL HIGH (ref 70–99)

## 2012-06-30 MED ORDER — POTASSIUM CHLORIDE CRYS ER 20 MEQ PO TBCR
20.0000 meq | EXTENDED_RELEASE_TABLET | Freq: Once | ORAL | Status: AC
  Administered 2012-06-30: 20 meq via ORAL
  Filled 2012-06-30: qty 1

## 2012-06-30 MED ORDER — CIPROFLOXACIN HCL 500 MG PO TABS
500.0000 mg | ORAL_TABLET | Freq: Two times a day (BID) | ORAL | Status: AC
  Administered 2012-06-30 – 2012-07-06 (×14): 500 mg via ORAL
  Filled 2012-06-30 (×14): qty 1

## 2012-06-30 MED ORDER — INSULIN GLARGINE 100 UNIT/ML ~~LOC~~ SOLN
45.0000 [IU] | Freq: Every day | SUBCUTANEOUS | Status: DC
  Administered 2012-06-30 – 2012-07-05 (×6): 45 [IU] via SUBCUTANEOUS
  Filled 2012-06-30 (×9): qty 0.45

## 2012-06-30 MED ORDER — INSULIN GLARGINE 100 UNIT/ML ~~LOC~~ SOLN
50.0000 [IU] | Freq: Every day | SUBCUTANEOUS | Status: DC
  Administered 2012-06-30 – 2012-07-05 (×6): 50 [IU] via SUBCUTANEOUS
  Filled 2012-06-30 (×7): qty 0.5

## 2012-06-30 NOTE — Progress Notes (Signed)
ANTICOAGULATION CONSULT NOTE - Follow Up Consult  Pharmacy Consult for Heparin Indication: chest pain/ACS  Allergies  Allergen Reactions  . Atorvastatin Other (See Comments)    Unknown-patient admitted to hospital after taking  . Citrus Dermatitis    BREAK OUT ON BODY  . Contrast Media (Iodinated Diagnostic Agents)   . Iohexol      Desc: ANAPHYLAXIS   . Zolpidem Tartrate     REACTION: hallucinations  . Milk-Related Compounds Rash    Patient Measurements: Height: 5\' 5"  (165.1 cm) Weight: 189 lb 2.5 oz (85.8 kg) IBW/kg (Calculated) : 57 Heparin Dosing Weight: 75kg  Vital Signs: Temp: 98.4 F (36.9 C) (06/11 0806) Temp src: Oral (06/11 0806) BP: 129/62 mmHg (06/11 0806) Pulse Rate: 70 (06/11 0810)  Labs:  Recent Labs  06/28/12 0350 06/28/12 1125 06/28/12 1402 06/29/12 0735 06/30/12 0445  HGB 13.5  --   --  14.0 13.4  HCT 40.0  --   --  41.8 41.2  PLT 218  --   --  210 229  HEPARINUNFRC 0.28* 0.39  --  0.56 0.35  CREATININE 0.86  --   --   --  0.97  TROPONINI  --   --  0.53*  --   --     Estimated Creatinine Clearance: 50 ml/min (by C-G formula based on Cr of 0.97).   Medications:  . sodium chloride Stopped (06/29/12 2310)  . heparin 1,700 Units/hr (06/30/12 0600)    Assessment: 82yof on heparin for CP/ACS. Heparin level (0.35) remains therapeutic on 1700 units/hr.   - H/H and plts wnl - No significant bleeding reported  Goal of Therapy:  Heparin level 0.3-0.7 units/ml Monitor platelets by anticoagulation protocol: Yes   Plan:  1. Continue IV heparin at current rate of 1700 units/hr. 2. Will check daily heparin level and CBC. 3. F/U plans length of heparin therapy.  Tad Moore, BCPS  Clinical Pharmacist Pager [phone]  06/30/2012 10:28 AM

## 2012-06-30 NOTE — Progress Notes (Signed)
CARDIAC REHAB PHASE I   PRE:  Rate/Rhythm: 71 SR    BP: sitting 129/57    SaO2:   MODE:  Ambulation: 210 ft   POST:  Rate/Rhythm: 93 SR    BP: sitting 159/89     SaO2: 96 RA  Tolerated fairly well. Denied CP. Some SOB and faitigue after long distance. Used RW. Sts she has knee pain as well. To recliner after walk, NTG reviewed .  1020- 1107  Cathy [name] [place] CES, [place] 06/30/2012 11:04 AM

## 2012-06-30 NOTE — Progress Notes (Addendum)
Patient: Cathy [name] / Admit Date: 06/26/2012 / Date of Encounter: 06/30/2012, 8:37 AM   Subjective  Last episode of CP was at 11am yesterday. Today, no CP or SOB. Feeling well. F/u EKG this AM shows stable QTc since starting Ranexa, but does show more pronounced TWI/ST depression V4-V6 than prior - (?LVH with repol abnormality). However, patient is completely asymptomatic and asking if she's going home today. She's excited that her new grandson may be born today.   Objective   Telemetry: NSR, occ PVCs, 6 beats NSVT  Physical Exam: Filed Vitals:   06/30/12 0806  BP: 129/62  Pulse: 76  Temp: 98.4 F (36.9 C)  Resp: 19   General: Well developed, well nourished WF in no acute distress. Head: Normocephalic, atraumatic, sclera non-icteric, no xanthomas, nares are without discharge. Neck: Negative for carotid bruits. JVD not elevated. Lungs: Clear bilaterally to auscultation without wheezes, rales, or rhonchi. Breathing is unlabored. Heart: RRR S1 S2 without murmurs, rubs, or gallops.  Abdomen: Soft, non-tender, non-distended with normoactive bowel sounds. No hepatomegaly. No rebound/guarding. No obvious abdominal masses. Msk:  Strength and tone appear normal for age. Extremities: No clubbing or cyanosis. No edema.  Distal pedal pulses are 2+ and equal bilaterally. Neuro: Alert and oriented X 3. Moves all extremities spontaneously. Psych:  Responds to questions appropriately with a normal affect.    Intake/Output Summary (Last 24 hours) at 06/30/12 0837 Last data filed at 06/30/12 0600  Gross per 24 hour  Intake    724 ml  Output   3150 ml  Net  -2426 ml    Inpatient Medications:  . aspirin EC  81 mg Oral Daily  . carvedilol  12.5 mg Oral BID WC  . ciprofloxacin  500 mg Oral BID  . clopidogrel  75 mg Oral q morning - 10a  . fenofibrate  160 mg Oral Daily  . folic acid  1 mg Oral q morning - 10a  . furosemide  40 mg Oral Daily  . insulin aspart  0-15 Units Subcutaneous TID WC    . insulin aspart  10 Units Subcutaneous TID WC  . insulin glargine  45 Units Subcutaneous Daily  . insulin glargine  50 Units Subcutaneous QHS  . isosorbide mononitrate  60 mg Oral q morning - 10a  . levothyroxine  25 mcg Oral QAC breakfast  . omega-3 acid ethyl esters  2 g Oral BID  . ramipril  2.5 mg Oral BID  . ranolazine  500 mg Oral BID    Labs:  Recent Labs  06/28/12 0350 06/30/12 0445  NA 136 139  K 3.8 3.8  CL 101 101  CO2 25 29  GLUCOSE 287* 195*  BUN 19 19  CREATININE 0.86 0.97  CALCIUM 9.6 10.1    Recent Labs  06/29/12 0735 06/30/12 0445  WBC 9.1 10.1  HGB 14.0 13.4  HCT 41.8 41.2  MCV 87.6 88.4  PLT 210 229    Recent Labs  06/28/12 1402  TROPONINI 0.53*   Radiology/Studies:  Dg Chest Port 1 View 06/26/2012   *RADIOLOGY REPORT*  Clinical Data: Chest pain  PORTABLE CHEST - 1 VIEW  Comparison: Prior chest x-ray 05/27/2012  Findings: Stable appearance of the chest with cardiomegaly and mild left atrial appendage enlargement.  The patient status post median sternotomy with evidence of prior multivessel CABG including LIMA bypass.  Atherosclerotic calcifications noted in the transverse aorta.  There may be trace bibasilar subsegmental atelectasis as seen on prior studies.  No  pulmonary edema, pneumothorax, large effusion or focal airspace consolidation.  Advanced degenerative changes of bilateral glenohumeral joints consistent with osteoarthritis.  No acute osseous abnormality.  IMPRESSION:  No acute cardiopulmonary disease, stable chest x-ray.   Original Report Authenticated By: Malachy Moan, M.D.     Assessment and Plan  1. CAD/NSTEMI this admission - also has h/o STEMI 03/2012. Dr. Myrtis Ser had long discussion with Dr. Excell Seltzer regarding prior cath films - she has multiple areas of potential ischemia but these areas cannot be approached with intervention. She has a very complex lesion in Cx which could be approached but unclear if it would help symptoms. For med  rx now. Ranexa added yesterday - f/u QTc stable this AM. Discussed EKG changes with Dr. Myrtis Ser. Will check another troponin now, obtain f/u EKG in AM, and ask cardiac rehab to see. Cont ASA, Plavix, Coreg, Imdur. Has h/o statin intolerance. 2. ICM EF 30-35% 03/2012 - continue ACEI, BB. 3. HTN - stable.  4. DM - meal coverage added yesterday afternoon. Will also increase her PM Lantus dose back up to home dose of 50 units.  5. Carotid artery disease - 80-99 R prox ICA 04/2012 - per vascular notes, Dr. Myrtis Ser to decide when patient is medically stable from cardiac standpoint to proceed with carotid endarterectomy. 6. Hypothyroidism - TSH pending. 7. Pseudomonas UTI - pansensitive. Start Cipro 500mg  BID x 7 days. 8. NSVT - cont BB. Give KCl this AM and recheck K level tomorrow. If ~4, start daily.  Will ask cardiac rehab to see this AM. MD to consider discontinuing heparin and transferring to tele upon rounds.  Signed, Ronie Spies PA-C Patient seen and examined. I agree with the assessment and plan as detailed above. See also my additional thoughts below.   The patient is feeling well. It is possible that some of her EKG changes may be evolution of the event that she had this admission. We will check troponin again to see if there is any further change. Most likely however this will not change the approach. The plan will be to continue her ambulation. Her coronary disease will be treated medically. If she has recurrent significant unrelenting symptoms, I will arrange for Dr. Excell Seltzer to intervene on her circumflex. Ranexa is a new medication for her. QT interval was being followed. It is important that we followup her TSH when he comes back. Her urinary tract infection is now being treated.  This patient has a multitude of medical problems. The plan is to treat her medically. We need to be sure that she is completely stable and ambulatory with no chest pain before she can go home. This will be a  daily decision.    The patient tells me that it will be impossible for her to afford Ranexa. We need to see if this can be provided to her by the company. If not I will have to change the approach. In that situation the plan would be to take her off Ranexa and use it only if she has return of significant symptoms.  [name], MD, [facility] 06/30/2012 10:53 AM

## 2012-07-01 LAB — BASIC METABOLIC PANEL
BUN: 21 mg/dL (ref 6–23)
CO2: 27 mEq/L (ref 19–32)
Calcium: 9.9 mg/dL (ref 8.4–10.5)
Chloride: 102 mEq/L (ref 96–112)
Creatinine, Ser: 0.99 mg/dL (ref 0.50–1.10)
GFR calc Af Amer: 61 mL/min — ABNORMAL LOW (ref >90)
GFR calc non Af Amer: 52 mL/min — ABNORMAL LOW (ref >90)
Glucose, Bld: 282 mg/dL — ABNORMAL HIGH (ref 70–99)
Potassium: 3.9 mEq/L (ref 3.5–5.1)
Sodium: 137 mEq/L (ref 135–145)

## 2012-07-01 LAB — GLUCOSE, CAPILLARY
Glucose-Capillary: 255 mg/dL — ABNORMAL HIGH (ref 70–99)
Glucose-Capillary: 223 mg/dL — ABNORMAL HIGH (ref 70–99)
Glucose-Capillary: 277 mg/dL — ABNORMAL HIGH (ref 70–99)
Glucose-Capillary: 228 mg/dL — ABNORMAL HIGH (ref 70–99)

## 2012-07-01 LAB — CBC
HCT: 41.3 % (ref 36.0–46.0)
MCV: 88.1 fL (ref 78.0–100.0)
RDW: 14.4 % (ref 11.5–15.5)
WBC: 9.8 10*3/uL (ref 4.0–10.5)

## 2012-07-01 MED ORDER — AMLODIPINE BESYLATE 5 MG PO TABS
5.0000 mg | ORAL_TABLET | Freq: Every day | ORAL | Status: DC
  Administered 2012-07-01 – 2012-07-09 (×8): 5 mg via ORAL
  Filled 2012-07-01 (×9): qty 1

## 2012-07-01 NOTE — Progress Notes (Signed)
Subjective:  No chest pain. Patient states that she will not be able to afford Ranexa even if she has to pay just 50% of cost. Therefore will stop Ranexa and see if her chest pain remains suppressed on current meds.  Will add amlodipine for additional anti-anginal effect.  Objective:  Vital Signs in the last 24 hours: Temp:  [97.9 F (36.6 C)-98.7 F (37.1 C)] 98.1 F (36.7 C) (06/12 0735) Pulse Rate:  [70-80] 73 (06/12 0735) Resp:  [17-25] 20 (06/12 0600) BP: (114-149)/(48-70) 130/66 mmHg (06/12 0735) SpO2:  [94 %-98 %] 94 % (06/12 0735)  Intake/Output from previous day: 06/11 0701 - 06/12 0700 In: 1296.1 [P.O.:1170; I.V.:126.1] Out: 1700 [Urine:1700] Intake/Output from this shift:    . aspirin EC  81 mg Oral Daily  . carvedilol  12.5 mg Oral BID WC  . ciprofloxacin  500 mg Oral BID  . clopidogrel  75 mg Oral q morning - 10a  . fenofibrate  160 mg Oral Daily  . folic acid  1 mg Oral q morning - 10a  . furosemide  40 mg Oral Daily  . insulin aspart  0-15 Units Subcutaneous TID WC  . insulin aspart  10 Units Subcutaneous TID WC  . insulin glargine  45 Units Subcutaneous Daily  . insulin glargine  50 Units Subcutaneous QHS  . isosorbide mononitrate  60 mg Oral q morning - 10a  . levothyroxine  25 mcg Oral QAC breakfast  . omega-3 acid ethyl esters  2 g Oral BID  . ramipril  2.5 mg Oral BID   . sodium chloride Stopped (06/29/12 2310)    Physical Exam: The patient appears to be in no distress.  Head and neck exam reveals that the pupils are equal and reactive.  The extraocular movements are full.  There is no scleral icterus.  Mouth and pharynx are benign.  No lymphadenopathy.  No carotid bruits.  The jugular venous pressure is normal.  Thyroid is not enlarged or tender.  Chest is clear to percussion and auscultation.  No rales or rhonchi.  Expansion of the chest is symmetrical.  Heart reveals no abnormal lift or heave.  First and second heart sounds are normal.  There  is no murmur gallop rub or click.  The abdomen is soft and nontender.  Bowel sounds are normoactive.  There is no hepatosplenomegaly or mass.  There are no abdominal bruits.  Extremities reveal no phlebitis or edema.  Pedal pulses are good.  There is no cyanosis or clubbing.  Neurologic exam is normal strength and no lateralizing weakness.  No sensory deficits.  Integument reveals no rash  Lab Results:  Recent Labs  06/30/12 0445 07/01/12 0400  WBC 10.1 9.8  HGB 13.4 13.7  PLT 229 234    Recent Labs  06/30/12 0445 07/01/12 0400  NA 139 137  K 3.8 3.9  CL 101 102  CO2 29 27  GLUCOSE 195* 282*  BUN 19 21  CREATININE 0.97 0.99    Recent Labs  06/28/12 1402 06/30/12 0927  TROPONINI 0.53* 0.33*   Hepatic Function Panel No results found for this basename: PROT, ALBUMIN, AST, ALT, ALKPHOS, BILITOT, BILIDIR, IBILI,  in the last 72 hours No results found for this basename: CHOL,  in the last 72 hours No results found for this basename: PROTIME,  in the last 72 hours  Imaging: Imaging results have been reviewed  Cardiac Studies: EKG this am is stable. Lateral T waves are less inverted. Assessment/Plan:  1. CAD/NSTEMI this admission - Unable to afford Ranexa even with help. Stop ranexa and start amlodipine. 2. ICM EF 30-35% 03/2012 - continue ACEI, BB.  3. HTN - stable.  4. DM - meal coverage added yesterday afternoon. Will also increase her PM Lantus dose back up to home dose of 50 units.  5. Carotid artery disease - 80-99 R prox ICA 04/2012 - per vascular notes, Dr. Myrtis Ser to decide when patient is medically stable from cardiac standpoint to proceed with carotid endarterectomy.  6. Hypothyroidism - TSH pending.  7. Pseudomonas UTI - pansensitive. Start Cipro 500mg  BID x 7 days.  8. NSVT - cont BB. Give KCl this AM and recheck K level tomorrow. If ~4, start daily.  Trial ambulation with cardiac rehab today off Ranexa. Needs to be pain-free before going  home.   LOS: 5 days    Cassell Clement 07/01/2012, 7:57 AM

## 2012-07-01 NOTE — Progress Notes (Signed)
CARDIAC REHAB PHASE I   PRE:  Rate/Rhythm: 73 SR  BP:  Supine: 99/66  Sitting:   Standing:    SaO2: 93 RA  MODE:  Ambulation: 240 ft   POST:  Rate/Rhythm: 94 SR  BP:  Supine:   Sitting: 137/56  Standing:    SaO2: 96 RA 1015-1050 Assisted X 1 and used walker to ambulate. Gait steady with walker. Pt tires easily is DOE. On way back from walk pt c/o of chest heaviness  "4" in left side. Pt almost back to room. Sat pt in recliner, states that pain went into right side of neck. VS stable. Pt declines to use any NTG. With rest pt states that pain is resolving.I checked on pt again she stes that pain has completely resolved. She is in recliner with call light in reach. I informed pt's nurse of pain.  Melina Copa RN 07/01/2012 10:49 AM

## 2012-07-02 ENCOUNTER — Other Ambulatory Visit: Payer: Self-pay

## 2012-07-02 LAB — CBC
HCT: 42.4 % (ref 36.0–46.0)
Hemoglobin: 14.2 g/dL (ref 12.0–15.0)
MCHC: 33.5 g/dL (ref 30.0–36.0)
RBC: 4.84 MIL/uL (ref 3.87–5.11)
WBC: 10.4 10*3/uL (ref 4.0–10.5)

## 2012-07-02 LAB — TROPONIN I: Troponin I: <0.3 ng/mL (ref <0.30)

## 2012-07-02 LAB — GLUCOSE, CAPILLARY
Glucose-Capillary: 215 mg/dL — ABNORMAL HIGH (ref 70–99)
Glucose-Capillary: 197 mg/dL — ABNORMAL HIGH (ref 70–99)

## 2012-07-02 MED ORDER — NITROGLYCERIN IN D5W 200-5 MCG/ML-% IV SOLN
5.0000 ug/min | INTRAVENOUS | Status: DC
  Administered 2012-07-02: 5 ug/min via INTRAVENOUS
  Administered 2012-07-04: 15 ug/min via INTRAVENOUS
  Filled 2012-07-02 (×2): qty 250

## 2012-07-02 MED ORDER — MORPHINE SULFATE 2 MG/ML IJ SOLN
2.0000 mg | INTRAMUSCULAR | Status: DC | PRN
  Administered 2012-07-02 – 2012-07-06 (×9): 2 mg via INTRAVENOUS
  Filled 2012-07-02: qty 2
  Filled 2012-07-02 (×9): qty 1

## 2012-07-02 MED ORDER — CARVEDILOL 25 MG PO TABS
25.0000 mg | ORAL_TABLET | Freq: Two times a day (BID) | ORAL | Status: DC
  Administered 2012-07-02 – 2012-07-09 (×14): 25 mg via ORAL
  Filled 2012-07-02 (×16): qty 1

## 2012-07-02 MED ORDER — HEPARIN BOLUS VIA INFUSION
4000.0000 [IU] | Freq: Once | INTRAVENOUS | Status: AC
  Administered 2012-07-02: 4000 [IU] via INTRAVENOUS
  Filled 2012-07-02: qty 4000

## 2012-07-02 MED ORDER — MORPHINE SULFATE 2 MG/ML IJ SOLN
INTRAMUSCULAR | Status: AC
  Administered 2012-07-02: 2 mg via INTRAVENOUS
  Filled 2012-07-02: qty 1

## 2012-07-02 MED ORDER — HEPARIN (PORCINE) IN NACL 100-0.45 UNIT/ML-% IJ SOLN
1700.0000 [IU]/h | INTRAMUSCULAR | Status: DC
  Administered 2012-07-02 – 2012-07-04 (×4): 1700 [IU]/h via INTRAVENOUS
  Filled 2012-07-02 (×9): qty 250

## 2012-07-02 NOTE — Progress Notes (Signed)
Inpatient Diabetes Program Recommendations  AACE/ADA: New Consensus Statement on Inpatient Glycemic Control (2013)  Target Ranges:  Prepandial:   less than 140 mg/dL      Peak postprandial:   less than 180 mg/dL (1-2 hours)      Critically ill patients:  140 - 180 mg/dL   Consider increasing a.m. dose Lantus from 45 units to 50 units. Increase Novolog with meals to 12 units TID. Thank you  Piedad Climes BSN, RN,CDE Inpatient Diabetes Coordinator [phone] (team pager)

## 2012-07-02 NOTE — Progress Notes (Signed)
ANTICOAGULATION CONSULT NOTE - Follow Up Consult  Pharmacy Consult for Heparin Indication: recurrent chest pain  Allergies  Allergen Reactions  . Atorvastatin Other (See Comments)    Unknown-patient admitted to hospital after taking  . Citrus Dermatitis    BREAK OUT ON BODY  . Contrast Media (Iodinated Diagnostic Agents)   . Iohexol      Desc: ANAPHYLAXIS   . Zolpidem Tartrate     REACTION: hallucinations  . Milk-Related Compounds Rash    Patient Measurements: Height: 5\' 5"  (165.1 cm) Weight: 189 lb 2.5 oz (85.8 kg) IBW/kg (Calculated) : 57 Heparin Dosing Weight: 75 kg  Vital Signs: Temp: 98.9 F (37.2 C) (06/13 1600) Temp src: Oral (06/13 1600) BP: 123/59 mmHg (06/13 1630) Pulse Rate: 79 (06/13 1635)  Labs:  Recent Labs  06/30/12 0445 06/30/12 0927 07/01/12 0400 07/02/12 0355  HGB 13.4  --  13.7 14.2  HCT 41.2  --  41.3 42.4  PLT 229  --  234 254  HEPARINUNFRC 0.35  --   --   --   CREATININE 0.97  --  0.99  --   TROPONINI  --  0.33*  --   --     Estimated Creatinine Clearance: 49 ml/min (by C-G formula based on Cr of 0.99).   Medications:  . sodium chloride Stopped (06/29/12 2310)  . nitroGLYCERIN      Assessment: 82 yof previously on heparin which was stopped earlier today. Pharmacy consulted to resume heparin for recurrent chest pain. It is reported patient is pale and diaphoretic. Last heparin level on 6/11 was 0.35 and therapeutic on 1700 units/hr.  - H/H and plts wnl - No significant bleeding reported  Goal of Therapy:  Heparin level 0.3-0.7 units/ml Monitor platelets by anticoagulation protocol: Yes   Plan:  - Resume heparin with 4000 units IV bolus and infuse at 1700 units/hr - Heparin level 8 hours after started - Daily heparin level and CBC while on heparin - Monitor for signs/symptoms of bleeding  [facility], [place].D., BCPS Clinical Pharmacist Pager: [phone] 07/02/2012 5:43 PM

## 2012-07-02 NOTE — Progress Notes (Signed)
CARDIAC REHAB PHASE I   PRE:  Rate/Rhythm: 78SR  BP:  Supine:   Sitting: 111/47  Standing:    SaO2: 94%RA  MODE:  Ambulation: 240 ft   POST:  Rate/Rhythm: 98SR  BP:  Supine:   Sitting: 92/61  Standing:    SaO2: 98%RA 1130-1203 . Pt walked 240 ft with rolling walker and asst x 1 with fairly steady gait. Pt would not walk farther. Denied CP or dizziness with walk.BSC after walk. BP lower at 92/61. To recliner after walk. Call bell in reach. Pt stated how to take NTG tablets if needed.   Luetta Nutting, RN BSN  07/02/2012 11:58 AM

## 2012-07-02 NOTE — Progress Notes (Signed)
Pt c/o buttocks hurting. Area red but blanchable to both buttocks. Meplex applied to both sides. Encouraged pt to turn sided to side while in bed. Pt voiced understanding. Will monitor.

## 2012-07-02 NOTE — Progress Notes (Signed)
Called by RN re: chest pain.  Pt had onset of SSCP this pm, same as admitting pain. Symptoms improved somewhat with SL NTG but did not resolve. By report, she is pale, diaphoretic and has SBP approx 100, limiting further use of SL NTG.   Will order MSO4 for pain control. Advise MD of pt condition, further med changes per MD.

## 2012-07-02 NOTE — Progress Notes (Signed)
Subjective:  No chest pain this am.  Did have chest pain with ambulation yesterday. Feels better today. Pulse 81 at rest. BP okay.   Objective:  Vital Signs in the last 24 hours: Temp:  [97.9 F (36.6 C)-98.4 F (36.9 C)] 98 F (36.7 C) (06/13 0740) Pulse Rate:  [71-84] 76 (06/13 0740) Resp:  [16-22] 20 (06/13 0740) BP: (109-139)/(48-87) 123/56 mmHg (06/13 0740) SpO2:  [94 %-96 %] 96 % (06/13 0740)  Intake/Output from previous day: 06/12 0701 - 06/13 0700 In: 1080 [P.O.:1080] Out: 3075 [Urine:3075] Intake/Output from this shift:    . amLODipine  5 mg Oral Daily  . aspirin EC  81 mg Oral Daily  . carvedilol  12.5 mg Oral BID WC  . ciprofloxacin  500 mg Oral BID  . clopidogrel  75 mg Oral q morning - 10a  . fenofibrate  160 mg Oral Daily  . folic acid  1 mg Oral q morning - 10a  . furosemide  40 mg Oral Daily  . insulin aspart  0-15 Units Subcutaneous TID WC  . insulin aspart  10 Units Subcutaneous TID WC  . insulin glargine  45 Units Subcutaneous Daily  . insulin glargine  50 Units Subcutaneous QHS  . isosorbide mononitrate  60 mg Oral q morning - 10a  . levothyroxine  25 mcg Oral QAC breakfast  . omega-3 acid ethyl esters  2 g Oral BID  . ramipril  2.5 mg Oral BID   . sodium chloride Stopped (06/29/12 2310)    Physical Exam: The patient appears to be in no distress.  Head and neck exam reveals that the pupils are equal and reactive.  The extraocular movements are full.  There is no scleral icterus.  Mouth and pharynx are benign.  No lymphadenopathy.  No carotid bruits.  The jugular venous pressure is normal.  Thyroid is not enlarged or tender.  Chest is clear to percussion and auscultation.  No rales or rhonchi.  Expansion of the chest is symmetrical.  Heart reveals no abnormal lift or heave.  First and second heart sounds are normal.  There is no murmur gallop rub or click.  The abdomen is soft and nontender.  Bowel sounds are normoactive.  There is no  hepatosplenomegaly or mass.  There are no abdominal bruits.  Extremities reveal no phlebitis or edema.  Pedal pulses are good.  There is no cyanosis or clubbing.  Neurologic exam is normal strength and no lateralizing weakness.  No sensory deficits.  Integument reveals no rash  Lab Results:  Recent Labs  07/01/12 0400 07/02/12 0355  WBC 9.8 10.4  HGB 13.7 14.2  PLT 234 254    Recent Labs  06/30/12 0445 07/01/12 0400  NA 139 137  K 3.8 3.9  CL 101 102  CO2 29 27  GLUCOSE 195* 282*  BUN 19 21  CREATININE 0.97 0.99    Recent Labs  06/30/12 0927  TROPONINI 0.33*   Hepatic Function Panel No results found for this basename: PROT, ALBUMIN, AST, ALT, ALKPHOS, BILITOT, BILIDIR, IBILI,  in the last 72 hours No results found for this basename: CHOL,  in the last 72 hours No results found for this basename: PROTIME,  in the last 72 hours  Imaging: Imaging results have been reviewed  Cardiac Studies: EKG this am is stable. Lateral T waves are less inverted. Assessment/Plan:  1. CAD/NSTEMI this admission - Unable to afford Ranexa even with help. Stop ranexa and start amlodipine. 2. ICM EF  30-35% 03/2012 - continue ACEI, BB. Increase carvedilol to 25 mg daily to help with suppressing exertional angina. 3. HTN - stable.  4. DM - meal coverage added yesterday afternoon. Will also increase her PM Lantus dose back up to home dose of 50 units.  5. Carotid artery disease - 80-99 R prox ICA 04/2012 - per vascular notes, Dr. Myrtis Ser to decide when patient is medically stable from cardiac standpoint to proceed with carotid endarterectomy.  6. Hypothyroidism - TSH pending.  7. Pseudomonas UTI - pansensitive. Start Cipro 500mg  BID x 7 days.  8. NSVT - cont BB.   Continue ambulation with cardiac rehab today off Ranexa. Hope to have her  pain-free or at least very well controlled before going home.   LOS: 6 days    Cathy [name] 07/02/2012, 9:29 AM

## 2012-07-02 NOTE — Progress Notes (Signed)
Assumed cross coverage care from [facility]. Pt received 3 SL NTG with moderate improvement in pain, SBP to 100s, then total of 4mg  morphine with still 1-2/10 discomfort. SBP 122/53 and holding. EKG and situation d/w Dr. Antoine Poche - plan for now is to try to cool off. Will likely need attempted PCI on Monday as previously outlined (difficult anatomy). Place on IV NTG (hold Imdur) and titrate as needed. Cycle enzymes. Restart heparin per pharmacy. Oncoming PA also notified that we are attempting to get her pain free. Dayna Dunn PA-C

## 2012-07-03 LAB — BASIC METABOLIC PANEL
Calcium: 9.7 mg/dL (ref 8.4–10.5)
Creatinine, Ser: 1.18 mg/dL — ABNORMAL HIGH (ref 0.50–1.10)
GFR calc Af Amer: 49 mL/min — ABNORMAL LOW (ref >90)

## 2012-07-03 LAB — CBC
HCT: 41.2 % (ref 36.0–46.0)
MCHC: 33 g/dL (ref 30.0–36.0)
MCV: 88.8 fL (ref 78.0–100.0)
RDW: 14.6 % (ref 11.5–15.5)

## 2012-07-03 LAB — GLUCOSE, CAPILLARY
Glucose-Capillary: 321 mg/dL — ABNORMAL HIGH (ref 70–99)
Glucose-Capillary: 357 mg/dL — ABNORMAL HIGH (ref 70–99)

## 2012-07-03 MED ORDER — GLUCERNA SHAKE PO LIQD
237.0000 mL | Freq: Two times a day (BID) | ORAL | Status: DC
  Administered 2012-07-07: 237 mL via ORAL

## 2012-07-03 MED ORDER — INSULIN ASPART 100 UNIT/ML ~~LOC~~ SOLN
5.0000 [IU] | Freq: Once | SUBCUTANEOUS | Status: AC
  Administered 2012-07-03: 5 [IU] via SUBCUTANEOUS

## 2012-07-03 NOTE — Progress Notes (Signed)
Nutrition Brief Note  RN contacted RD with concern that pt c/o being hungry all the time. Pt reports she has been this way all of her life, despite eating multiple meals during day. Discussed foods that promote satiety such as whole grains and other foods that contain fiber as well as protein. Ordered Glucerna shakes BID.   Body mass index is 31.48 kg/(m^2). Patient meets criteria for class I obesity based on current BMI.   Current diet order is CHO modified, patient is consuming approximately 100% of meals at this time. Labs and medications reviewed. Noted pt with elevated blood sugar.   No further nutrition interventions warranted at this time. If nutrition issues arise, please consult RD.   Levon Hedger MS, RD, LDN [phone] Weekend/After Hours Pager

## 2012-07-03 NOTE — Progress Notes (Signed)
Pt complained of chest pain level 3/10 at 1135 this am. Pt was lying in bed at the time. Nitro gtt increased to 13 mcg and administered 2mg  iv morphine. Bp stable . Pain was relieved in 5 minutes. Pt has had ongoing chest pain which md has been aware of. Continue to monitor . Cathy [name]

## 2012-07-03 NOTE — Progress Notes (Signed)
Patient ID: Cathy [name], female   DOB: March 24, 1930, 79 y.o.   MRN: [id number]   Subjective:  Significant chest pain with ambulation yesterday.  Put back on heparin gtt and NTG gtt.  Pain-free overnight and this morning.  Cardiac enzymes negative.    Objective:  Vital Signs in the last 24 hours: Temp:  [97.4 F (36.3 C)-98.9 F (37.2 C)] 98.5 F (36.9 C) (06/14 0724) Pulse Rate:  [57-87] 69 (06/14 0700) Resp:  [14-25] 16 (06/14 0700) BP: (75-149)/(40-63) 117/57 mmHg (06/14 0700) SpO2:  [79 %-100 %] 97 % (06/14 0724)  Intake/Output from previous day: 06/13 0701 - 06/14 0700 In: 1449.2 [P.O.:960; I.V.:489.2] Out: 1775 [Urine:1775] Intake/Output from this shift:    . amLODipine  5 mg Oral Daily  . aspirin EC  81 mg Oral Daily  . carvedilol  25 mg Oral BID WC  . ciprofloxacin  500 mg Oral BID  . clopidogrel  75 mg Oral q morning - 10a  . fenofibrate  160 mg Oral Daily  . folic acid  1 mg Oral q morning - 10a  . furosemide  40 mg Oral Daily  . insulin aspart  0-15 Units Subcutaneous TID WC  . insulin aspart  10 Units Subcutaneous TID WC  . insulin glargine  45 Units Subcutaneous Daily  . insulin glargine  50 Units Subcutaneous QHS  . levothyroxine  25 mcg Oral QAC breakfast  . omega-3 acid ethyl esters  2 g Oral BID  . ramipril  2.5 mg Oral BID   . sodium chloride 5 mL/hr at 07/02/12 2000  . heparin 1,700 Units/hr (07/03/12 0506)  . nitroGLYCERIN 10 mcg/min (07/02/12 2000)    Physical Exam: The patient appears to be in no distress.  Neck with no JVD or thyromegaly  Chest is clear to percussion and auscultation.  No rales or rhonchi.  Expansion of the chest is symmetrical.  Heart reveals no abnormal lift or heave.  First and second heart sounds are normal.  There is no murmur gallop rub or click.  The abdomen is soft and nontender.  Bowel sounds are normoactive.  There is no hepatosplenomegaly or mass.  There are no abdominal bruits.  Extremities reveal no phlebitis  or edema.  Pedal pulses are good.  There is no cyanosis or clubbing.  Neurologic exam is normal strength and no lateralizing weakness.  No sensory deficits.  Integument reveals no rash  Lab Results:  Recent Labs  07/02/12 0355 07/03/12 0542  WBC 10.4 9.6  HGB 14.2 13.6  PLT 254 252    Recent Labs  07/01/12 0400 07/03/12 0542  NA 137 136  K 3.9 4.2  CL 102 99  CO2 27 27  GLUCOSE 282* 238*  BUN 21 24*  CREATININE 0.99 1.18*    Recent Labs  07/02/12 2357 07/03/12 0542  TROPONINI <0.30 <0.30   Hepatic Function Panel No results found for this basename: PROT, ALBUMIN, AST, ALT, ALKPHOS, BILITOT, BILIDIR, IBILI,  in the last 72 hours No results found for this basename: CHOL,  in the last 72 hours No results found for this basename: PROTIME,  in the last 72 hours  Imaging: Imaging results have been reviewed  Telemetry: NSR  Assessment/Plan:  1. CAD -  NSTEMI this admission.  Initial medical management in the setting of prior failed ramus intervention and diffuse/branch vessel disease not amenable to PCI.  Unable to afford Ranexa even with help. Has been on aggressive anti-anginal management with nitrates, amlodipine, and Coreg  but still have pain with ambulation (latest yesterday afternoon).  Films have been extensively reviewed, the LCx is a possible target for intervention to lessen ischemia (though complex disease).  - Continue amlodipine, NTG gtt, Coreg, ASA  - Heparin gtt for now - She does not tolerate statins. - Plan for cath lab Monday to attempt PCI LCx.  2. ICM EF 30-35% on echo 03/2012 - Continue Coreg and ramipril. 3. HTN - stable.  4. DM - On home dose of Lantus.  5. Carotid artery disease - 80-99 R prox ICA 04/2012 - per vascular notes, Dr. Myrtis Ser to decide when patient is medically stable from cardiac standpoint to proceed with carotid endarterectomy.  6. Pseudomonas UTI - pansensitive. Cipro 500mg  BID x 7 days.  7. NSVT - cont BB.    LOS: 7 days     Marca Ancona 07/03/2012, 8:46 AM

## 2012-07-03 NOTE — Progress Notes (Addendum)
ANTICOAGULATION CONSULT NOTE - Follow Up Consult  Pharmacy Consult for Heparin Indication: recurrent chest pain  Allergies  Allergen Reactions  . Atorvastatin Other (See Comments)    Unknown-patient admitted to hospital after taking  . Citrus Dermatitis    BREAK OUT ON BODY  . Contrast Media (Iodinated Diagnostic Agents)   . Iohexol      Desc: ANAPHYLAXIS   . Zolpidem Tartrate     REACTION: hallucinations  . Milk-Related Compounds Rash    Patient Measurements: Height: 5\' 5"  (165.1 cm) Weight: 189 lb 2.5 oz (85.8 kg) IBW/kg (Calculated) : 57 Heparin Dosing Weight: 75 kg  Vital Signs: Temp: 97.8 F (36.6 C) (06/14 0400) Temp src: Oral (06/14 0400) BP: 117/57 mmHg (06/14 0700) Pulse Rate: 69 (06/14 0700)  Labs:  Recent Labs  06/30/12 0927  07/01/12 0400 07/02/12 0355 07/02/12 1835 07/02/12 2357 07/03/12 0542  HGB  --   < > 13.7 14.2  --   --  13.6  HCT  --   --  41.3 42.4  --   --  41.2  PLT  --   --  234 254  --   --  252  HEPARINUNFRC  --   --   --   --   --   --  0.66  CREATININE  --   --  0.99  --   --   --   --   TROPONINI 0.33*  --   --   --  <0.30 <0.30  --   < > = values in this interval not displayed.  Estimated Creatinine Clearance: 49 ml/min (by C-G formula based on Cr of 0.99).   Medications:  . sodium chloride 5 mL/hr at 07/02/12 2000  . heparin 1,700 Units/hr (07/03/12 0506)  . nitroGLYCERIN 10 mcg/min (07/02/12 2000)    Assessment: 82 yof previously on heparin which was stopped earlier today. Pharmacy consulted to resume heparin for recurrent chest pain. It was reported patient was pale and diaphoretic. Restarted heparin last night, morning heparin level is 0.66, therapeutic.  - H/H and plts wnl - No significant bleeding reported  Goal of Therapy:  Heparin level 0.3-0.7 units/ml Monitor platelets by anticoagulation protocol: Yes   Plan:  Cont heparin at 1700 units/hr Daily CBC, heparin level Repeat 8 h heparin level  Doris Cheadle, PharmD Clinical Pharmacist Pager: [phone] Phone: [phone] 07/03/2012 7:29 AM    Addendum:  Repeat heparin level to confirm is therapeutic at 0.53.  No bleeding noted.  Will switch to daily heparin levels.   Doris Cheadle, PharmD Clinical Pharmacist Pager: [phone] Phone: [phone] 07/03/2012 3:05 PM

## 2012-07-04 LAB — BASIC METABOLIC PANEL
Calcium: 9.7 mg/dL (ref 8.4–10.5)
GFR calc Af Amer: 58 mL/min — ABNORMAL LOW (ref >90)
GFR calc non Af Amer: 50 mL/min — ABNORMAL LOW (ref >90)
Glucose, Bld: 274 mg/dL — ABNORMAL HIGH (ref 70–99)
Potassium: 4 mEq/L (ref 3.5–5.1)
Sodium: 135 mEq/L (ref 135–145)

## 2012-07-04 LAB — CBC
Hemoglobin: 12.8 g/dL (ref 12.0–15.0)
MCH: 29.2 pg (ref 26.0–34.0)
Platelets: 227 10*3/uL (ref 150–400)
RBC: 4.39 MIL/uL (ref 3.87–5.11)
WBC: 8.9 10*3/uL (ref 4.0–10.5)

## 2012-07-04 LAB — HEPARIN LEVEL (UNFRACTIONATED): Heparin Unfractionated: 0.48 IU/mL (ref 0.30–0.70)

## 2012-07-04 LAB — GLUCOSE, CAPILLARY: Glucose-Capillary: 221 mg/dL — ABNORMAL HIGH (ref 70–99)

## 2012-07-04 MED ORDER — SODIUM CHLORIDE 0.9 % IV SOLN: 250.0000 mL | INTRAVENOUS | Status: DC | PRN

## 2012-07-04 MED ORDER — PREDNISONE 50 MG PO TABS
60.0000 mg | ORAL_TABLET | ORAL | Status: AC
  Administered 2012-07-04: 60 mg via ORAL
  Filled 2012-07-04: qty 1

## 2012-07-04 MED ORDER — FAMOTIDINE 20 MG PO TABS
20.0000 mg | ORAL_TABLET | ORAL | Status: AC
  Administered 2012-07-04: 20 mg via ORAL
  Filled 2012-07-04: qty 1

## 2012-07-04 MED ORDER — DIPHENHYDRAMINE HCL 50 MG/ML IJ SOLN
25.0000 mg | INTRAMUSCULAR | Status: AC
  Administered 2012-07-05: 25 mg via INTRAVENOUS
  Filled 2012-07-04: qty 1

## 2012-07-04 MED ORDER — INSULIN ASPART 100 UNIT/ML ~~LOC~~ SOLN
10.0000 [IU] | Freq: Once | SUBCUTANEOUS | Status: AC
  Administered 2012-07-04: 10 [IU] via SUBCUTANEOUS

## 2012-07-04 MED ORDER — SODIUM CHLORIDE 0.9 % IJ SOLN
3.0000 mL | Freq: Two times a day (BID) | INTRAMUSCULAR | Status: DC
  Administered 2012-07-05: 3 mL via INTRAVENOUS

## 2012-07-04 MED ORDER — SODIUM CHLORIDE 0.9 % IJ SOLN: 3.0000 mL | INTRAMUSCULAR | Status: DC | PRN

## 2012-07-04 MED ORDER — PREDNISONE 50 MG PO TABS
60.0000 mg | ORAL_TABLET | ORAL | Status: AC
  Administered 2012-07-05: 60 mg via ORAL
  Filled 2012-07-04: qty 1

## 2012-07-04 MED ORDER — ASPIRIN 81 MG PO CHEW
324.0000 mg | CHEWABLE_TABLET | ORAL | Status: AC
  Administered 2012-07-05: 324 mg via ORAL
  Filled 2012-07-04: qty 4

## 2012-07-04 MED ORDER — SODIUM CHLORIDE 0.9 % IV SOLN: 1.0000 mL/kg/h | INTRAVENOUS | Status: DC

## 2012-07-04 NOTE — Progress Notes (Signed)
Patient ID: Cathy [name], female   DOB: 06-21-1930, 79 y.o.   MRN: [id number]   Subjective:  Chest pain again yesterday, this time at rest and relieved by sublingual NTG. Pain-free overnight and this morning.    Objective:  Vital Signs in the last 24 hours: Temp:  [97.9 F (36.6 C)-99.5 F (37.5 C)] 98.2 F (36.8 C) (06/15 0756) Pulse Rate:  [68-69] 68 (06/14 1141) Resp:  [16-18] 18 (06/15 0756) BP: (121-132)/(39-75) 132/50 mmHg (06/15 0400) SpO2:  [92 %-98 %] 95 % (06/15 0756)  Intake/Output from previous day: 06/14 0701 - 06/15 0700 In: 2036.1 [P.O.:1440; I.V.:596.1] Out: 1651 [Urine:1650; Stool:1] Intake/Output from this shift:    . amLODipine  5 mg Oral Daily  . aspirin EC  81 mg Oral Daily  . carvedilol  25 mg Oral BID WC  . ciprofloxacin  500 mg Oral BID  . clopidogrel  75 mg Oral q morning - 10a  . feeding supplement  237 mL Oral BID BM  . fenofibrate  160 mg Oral Daily  . folic acid  1 mg Oral q morning - 10a  . furosemide  40 mg Oral Daily  . insulin aspart  0-15 Units Subcutaneous TID WC  . insulin aspart  10 Units Subcutaneous TID WC  . insulin glargine  45 Units Subcutaneous Daily  . insulin glargine  50 Units Subcutaneous QHS  . levothyroxine  25 mcg Oral QAC breakfast  . omega-3 acid ethyl esters  2 g Oral BID  . ramipril  2.5 mg Oral BID   . sodium chloride 5 mL/hr at 07/02/12 2000  . heparin 1,700 Units/hr (07/03/12 1848)  . nitroGLYCERIN 15 mcg/min (07/03/12 2218)    Physical Exam: The patient appears to be in no distress.  Neck with no JVD or thyromegaly  Chest is clear to percussion and auscultation.  No rales or rhonchi.  Expansion of the chest is symmetrical.  Heart reveals no abnormal lift or heave.  First and second heart sounds are normal.  2/6 early SEM RUSB  The abdomen is soft and nontender.  Bowel sounds are normoactive.  There is no hepatosplenomegaly or mass.  There are no abdominal bruits.  Extremities reveal no phlebitis or  edema.  Pedal pulses are good.  There is no cyanosis or clubbing.  Neurologic exam is normal strength and no lateralizing weakness.  No sensory deficits.  Integument reveals no rash  Lab Results:  Recent Labs  07/03/12 0542 07/04/12 0613  WBC 9.6 8.9  HGB 13.6 12.8  PLT 252 227    Recent Labs  07/03/12 0542 07/04/12 0613  NA 136 135  K 4.2 4.0  CL 99 102  CO2 27 27  GLUCOSE 238* 274*  BUN 24* 23  CREATININE 1.18* 1.03    Recent Labs  07/02/12 2357 07/03/12 0542  TROPONINI <0.30 <0.30   Hepatic Function Panel No results found for this basename: PROT, ALBUMIN, AST, ALT, ALKPHOS, BILITOT, BILIDIR, IBILI,  in the last 72 hours No results found for this basename: CHOL,  in the last 72 hours No results found for this basename: PROTIME,  in the last 72 hours  Imaging: Imaging results have been reviewed  Telemetry: NSR  Assessment/Plan:  1. CAD -  NSTEMI this admission.  Initial medical management in the setting of prior failed ramus intervention and diffuse/branch vessel disease not amenable to PCI.  Unable to afford Ranexa even with help. Has been on aggressive anti-anginal management with nitrates, amlodipine, and Coreg  but still have pain (latest at rest yesterday afternoon).  Per notes from last week, films have been extensively reviewed, and the LCx is a possible target for intervention to lessen ischemia (though complex disease).  - Continue amlodipine, NTG gtt, Coreg, ASA  - Heparin gtt for now - She does not tolerate statins. - Plan for cath lab Monday to attempt PCI LCx. Will treat for contrast allergy.   2. ICM EF 30-35% on echo 03/2012 - Continue Coreg and ramipril. 3. HTN - stable.  4. DM - On home dose of Lantus.  5. Carotid artery disease - 80-99 R prox ICA 04/2012 - per vascular notes, Dr. Myrtis Ser to decide when patient is medically stable from cardiac standpoint to proceed with carotid endarterectomy.  6. Pseudomonas UTI - pansensitive. Cipro 500mg  BID x 7  days.  7. NSVT - cont BB.    LOS: 8 days    Marca Ancona 07/04/2012, 8:17 AM

## 2012-07-04 NOTE — Progress Notes (Signed)
ANTICOAGULATION CONSULT NOTE - Follow Up Consult  Pharmacy Consult for Heparin Indication: recurrent chest pain  Allergies  Allergen Reactions  . Atorvastatin Other (See Comments)    Unknown-patient admitted to hospital after taking  . Citrus Dermatitis    BREAK OUT ON BODY  . Contrast Media (Iodinated Diagnostic Agents)   . Iohexol      Desc: ANAPHYLAXIS   . Zolpidem Tartrate     REACTION: hallucinations  . Milk-Related Compounds Rash    Patient Measurements: Height: 5\' 5"  (165.1 cm) Weight: 189 lb 2.5 oz (85.8 kg) IBW/kg (Calculated) : 57 Heparin Dosing Weight: 75 kg  Vital Signs: Temp: 98.4 F (36.9 C) (06/15 0400) Temp src: Oral (06/15 0400) BP: 132/50 mmHg (06/15 0400)  Labs:  Recent Labs  07/02/12 0355 07/02/12 1835 07/02/12 2357 07/03/12 0542 07/03/12 1438 07/04/12 0613  HGB 14.2  --   --  13.6  --  12.8  HCT 42.4  --   --  41.2  --  38.7  PLT 254  --   --  252  --  227  HEPARINUNFRC  --   --   --  0.66 0.53 0.48  CREATININE  --   --   --  1.18*  --  1.03  TROPONINI  --  <0.30 <0.30 <0.30  --   --     Estimated Creatinine Clearance: 47.1 ml/min (by C-G formula based on Cr of 1.03).   Medications:  . sodium chloride 5 mL/hr at 07/02/12 2000  . heparin 1,700 Units/hr (07/03/12 1848)  . nitroGLYCERIN 15 mcg/min (07/03/12 2218)    Assessment: 82 yof previously on heparin which was stopped earlier today. Pharmacy consulted to resume heparin for recurrent chest pain. Heparin level therapeutic at 0.48. - H/H and plts wnl - No significant bleeding reported  Goal of Therapy:  Heparin level 0.3-0.7 units/ml Monitor platelets by anticoagulation protocol: Yes   Plan:  Cont heparin at 1700 units/hr Daily CBC, heparin level   Doris Cheadle, PharmD Clinical Pharmacist Pager: [phone] Phone: [phone] 07/04/2012 7:04 AM

## 2012-07-05 ENCOUNTER — Encounter (HOSPITAL_COMMUNITY): Payer: Self-pay

## 2012-07-05 ENCOUNTER — Encounter (HOSPITAL_COMMUNITY)
Admission: EM | Disposition: A | Discharge status: Home / Self Care | Payer: Self-pay | Source: Home / Self Care | Attending: Cardiology

## 2012-07-05 DIAGNOSIS — I214 Non-ST elevation (NSTEMI) myocardial infarction: Secondary | ICD-10-CM

## 2012-07-05 DIAGNOSIS — I519 Heart disease, unspecified: Secondary | ICD-10-CM

## 2012-07-05 DIAGNOSIS — I2510 Atherosclerotic heart disease of native coronary artery without angina pectoris: Secondary | ICD-10-CM

## 2012-07-05 HISTORY — PX: PERCUTANEOUS CORONARY STENT INTERVENTION (PCI-S): SHX5485

## 2012-07-05 LAB — GLUCOSE, CAPILLARY
Glucose-Capillary: 362 mg/dL — ABNORMAL HIGH (ref 70–99)
Glucose-Capillary: 283 mg/dL — ABNORMAL HIGH (ref 70–99)
Glucose-Capillary: 473 mg/dL — ABNORMAL HIGH (ref 70–99)
Glucose-Capillary: 478 mg/dL — ABNORMAL HIGH (ref 70–99)

## 2012-07-05 LAB — BASIC METABOLIC PANEL
GFR calc Af Amer: 70 mL/min — ABNORMAL LOW (ref >90)
GFR calc non Af Amer: 60 mL/min — ABNORMAL LOW (ref >90)
Potassium: 4.7 mEq/L (ref 3.5–5.1)
Sodium: 134 mEq/L — ABNORMAL LOW (ref 135–145)

## 2012-07-05 LAB — PROTIME-INR: Prothrombin Time: 14.1 seconds (ref 11.6–15.2)

## 2012-07-05 LAB — CBC
Hemoglobin: 13.7 g/dL (ref 12.0–15.0)
MCHC: 32.9 g/dL (ref 30.0–36.0)
RBC: 4.7 MIL/uL (ref 3.87–5.11)
WBC: 12 10*3/uL — ABNORMAL HIGH (ref 4.0–10.5)

## 2012-07-05 LAB — POCT ACTIVATED CLOTTING TIME: Activated Clotting Time: 263 seconds

## 2012-07-05 LAB — HEPARIN LEVEL (UNFRACTIONATED): Heparin Unfractionated: 0.83 IU/mL — ABNORMAL HIGH (ref 0.30–0.70)

## 2012-07-05 SURGERY — PERCUTANEOUS CORONARY STENT INTERVENTION (PCI-S)
Anesthesia: Moderate Sedation

## 2012-07-05 MED ORDER — MIDAZOLAM HCL 2 MG/2ML IJ SOLN
INTRAMUSCULAR | Status: AC
  Filled 2012-07-05: qty 2

## 2012-07-05 MED ORDER — FENTANYL CITRATE 0.05 MG/ML IJ SOLN
INTRAMUSCULAR | Status: AC
  Filled 2012-07-05: qty 2

## 2012-07-05 MED ORDER — SODIUM CHLORIDE 0.9 % IV SOLN: 1.0000 mL/kg/h | INTRAVENOUS | Status: AC

## 2012-07-05 MED ORDER — HEPARIN (PORCINE) IN NACL 2-0.9 UNIT/ML-% IJ SOLN
INTRAMUSCULAR | Status: AC
  Filled 2012-07-05: qty 1500

## 2012-07-05 MED ORDER — ONDANSETRON HCL 4 MG/2ML IJ SOLN
INTRAMUSCULAR | Status: AC
  Filled 2012-07-05: qty 2

## 2012-07-05 MED ORDER — LIDOCAINE HCL (PF) 1 % IJ SOLN
INTRAMUSCULAR | Status: AC
  Filled 2012-07-05: qty 30

## 2012-07-05 MED ORDER — BIVALIRUDIN 250 MG IV SOLR
INTRAVENOUS | Status: AC
  Filled 2012-07-05: qty 250

## 2012-07-05 NOTE — Progress Notes (Signed)
Spoke with Theodore Demark, PA,  RE pt's CBG's >400.  Pt NPO this AM and did not receive scheduled Lantus dose.  The returned from cath lab at 1245 and ate 2 trays for lunch at about 1430.  Pt given lantus dose at 1645 and meal coverage of 10 units of novolog.  Recheked CBG prior to evening meal at 1800 and CBG was 498.  Per Bjorn Loser, will give the max SSI dose of 15 units of novolog and the 10 units for meal coverage and recheck at 8pm in order to give the lantus time to start working and reevaluate the need for furthur intervention at that time.    Eliane Decree, RN

## 2012-07-05 NOTE — CV Procedure (Signed)
   CARDIAC CATH NOTE  Name: Cathy [name] MRN: [id number] DOB: 06-Jun-1930  Procedure: PTCA and stenting of the Ramus intermediate artery.  Indication: 79 year old white female presents with recurrent NSTEMI. She is status post remote CABG. She has known occlusion of the saphenous vein graft to the left circumflex. She has a patent saphenous vein graft to the RCA. The LIMA graft to the LAD is patent but the distal LAD is severely diseased. There is a complex 95% stenosis in the mid ramus intermediate branch which is a large vessel and very tortuous. Attempted PCI of this vessel on 06/15/2012 was unsuccessful due to inability to cross lesion with a stent because of marked tortuosity. Because of her refractory pain repeat intervention was recommended.  Procedural Details: The right groin was prepped, draped, and anesthetized with 1% lidocaine. Using the modified Seldinger technique, a 7 Fr long sheath was introduced into the right femoral artery.  Weight-based bivalirudin was given for anticoagulation. Once a therapeutic ACT was achieved, a 7 [place] left Voda 3.5 guide catheter was inserted.  A whisper coronary guidewire was used to cross the lesion. Using a Skyway catheter we exchanged the whisper wire for a Mailman wire for extra support. We then used a Guideliner catheter down to the initial lesion. The lesion was predilated with a 2.5 x 15 mm noncompliant balloon. At this point there was evidence of perforation of the artery with blush. The balloon was reinflated with occlusion of the artery and sealing of the perforation. We then prepared the left groin and placed a 7 French long sheath in the left femoral artery using the modified Seldinger technique under local anesthesia 1% lidocaine. A second 7 [place] XB LAD 3.5 guide was introduced and used with a ping-pong technique. A pro-water wire was placed through this guide across the lesion with brief balloon deflation. The balloon was then reinflated. Her  Angiomax drip was stopped. We initially planned to place a Alvino Chapel covered stent. However, after prolonged balloon inflations the perforation appeared to seal. We removed the balloon from the initial wire and placed it on the second wire while we prepared our stent platform. The lesion was then stented over the original wire with a 3.0 x 28 mm Promus stent. The pro-water wire was then removed from the second guide.  The stent was postdilated with a 3.25 mm noncompliant balloon.  Following PCI, there was 0% residual stenosis and TIMI-3 flow. There was no evidence of residual perforation. The patient was pain free and hemodynamically stable. Her rhythm was normal throughout. We observed the artery an additional 15 minutes in the lab without significant change. Echocardiogram performed at the end of the procedure showed no evidence of pericardial effusion. Final angiography confirmed an excellent result. The patient tolerated the procedure well.  Femoral hemostasis was achieved with manual compression. The patient was transferred to the post catheterization recovery area for further monitoring.  Lesion Data: Vessel: Ramus intermediate Percent stenosis (pre): 95% TIMI-flow (pre):  3 Stent:  3.0 x 28 mm Promus Percent stenosis (post): 0% TIMI-flow (post): 3  Conclusions:  Successful intracoronary stenting of the ramus intermediate branch. The procedure was complicated by vessel perforation which resolved with prolonged balloon inflation and stenting.  Recommendations: Continue dual antiplatelet therapy with aspirin and Plavix. The patient will be observed in the intensive care unit overnight.  Theron Arista [facility] 07/05/2012, 11:10 AM

## 2012-07-05 NOTE — Progress Notes (Signed)
Bilateral 48f sheaths removed from RFA and LFA @ 1240.  Manual pressure held for 20 minutes by Loma Messing, RTR-CV,  on the right groin and Leanord Asal, RN on the left groin.  Post sheath removal instructions given.  Pt acknowledges and understands instructions.  Bilateral groins level 0, palpable distal pulses, bandages applied to both groins.  HR 60, BP 124/39

## 2012-07-05 NOTE — H&P (View-Only) (Signed)
Patient ID: Cathy [name], female   DOB: 01/11/1931, 79 y.o.   MRN: [id number]   Subjective:  Chest pain again yesterday, this time at rest and relieved by sublingual NTG. Pain-free overnight and this morning.    Objective:  Vital Signs in the last 24 hours: Temp:  [97.9 F (36.6 C)-99.5 F (37.5 C)] 98.2 F (36.8 C) (06/15 0756) Pulse Rate:  [68-69] 68 (06/14 1141) Resp:  [16-18] 18 (06/15 0756) BP: (121-132)/(39-75) 132/50 mmHg (06/15 0400) SpO2:  [92 %-98 %] 95 % (06/15 0756)  Intake/Output from previous day: 06/14 0701 - 06/15 0700 In: 2036.1 [P.O.:1440; I.V.:596.1] Out: 1651 [Urine:1650; Stool:1] Intake/Output from this shift:    . amLODipine  5 mg Oral Daily  . aspirin EC  81 mg Oral Daily  . carvedilol  25 mg Oral BID WC  . ciprofloxacin  500 mg Oral BID  . clopidogrel  75 mg Oral q morning - 10a  . feeding supplement  237 mL Oral BID BM  . fenofibrate  160 mg Oral Daily  . folic acid  1 mg Oral q morning - 10a  . furosemide  40 mg Oral Daily  . insulin aspart  0-15 Units Subcutaneous TID WC  . insulin aspart  10 Units Subcutaneous TID WC  . insulin glargine  45 Units Subcutaneous Daily  . insulin glargine  50 Units Subcutaneous QHS  . levothyroxine  25 mcg Oral QAC breakfast  . omega-3 acid ethyl esters  2 g Oral BID  . ramipril  2.5 mg Oral BID   . sodium chloride 5 mL/hr at 07/02/12 2000  . heparin 1,700 Units/hr (07/03/12 1848)  . nitroGLYCERIN 15 mcg/min (07/03/12 2218)    Physical Exam: The patient appears to be in no distress.  Neck with no JVD or thyromegaly  Chest is clear to percussion and auscultation.  No rales or rhonchi.  Expansion of the chest is symmetrical.  Heart reveals no abnormal lift or heave.  First and second heart sounds are normal.  2/6 early SEM RUSB  The abdomen is soft and nontender.  Bowel sounds are normoactive.  There is no hepatosplenomegaly or mass.  There are no abdominal bruits.  Extremities reveal no phlebitis or  edema.  Pedal pulses are good.  There is no cyanosis or clubbing.  Neurologic exam is normal strength and no lateralizing weakness.  No sensory deficits.  Integument reveals no rash  Lab Results:  Recent Labs  07/03/12 0542 07/04/12 0613  WBC 9.6 8.9  HGB 13.6 12.8  PLT 252 227    Recent Labs  07/03/12 0542 07/04/12 0613  NA 136 135  K 4.2 4.0  CL 99 102  CO2 27 27  GLUCOSE 238* 274*  BUN 24* 23  CREATININE 1.18* 1.03    Recent Labs  07/02/12 2357 07/03/12 0542  TROPONINI <0.30 <0.30   Hepatic Function Panel No results found for this basename: PROT, ALBUMIN, AST, ALT, ALKPHOS, BILITOT, BILIDIR, IBILI,  in the last 72 hours No results found for this basename: CHOL,  in the last 72 hours No results found for this basename: PROTIME,  in the last 72 hours  Imaging: Imaging results have been reviewed  Telemetry: NSR  Assessment/Plan:  1. CAD -  NSTEMI this admission.  Initial medical management in the setting of prior failed ramus intervention and diffuse/branch vessel disease not amenable to PCI.  Unable to afford Ranexa even with help. Has been on aggressive anti-anginal management with nitrates, amlodipine, and Coreg   but still have pain (latest at rest yesterday afternoon).  Per notes from last week, films have been extensively reviewed, and the LCx is a possible target for intervention to lessen ischemia (though complex disease).  - Continue amlodipine, NTG gtt, Coreg, ASA  - Heparin gtt for now - She does not tolerate statins. - Plan for cath lab Monday to attempt PCI LCx. Will treat for contrast allergy.   2. ICM EF 30-35% on echo 03/2012 - Continue Coreg and ramipril. 3. HTN - stable.  4. DM - On home dose of Lantus.  5. Carotid artery disease - 80-99 R prox ICA 04/2012 - per vascular notes, Dr. Katz to decide when patient is medically stable from cardiac standpoint to proceed with carotid endarterectomy.  6. Pseudomonas UTI - pansensitive. Cipro 500mg BID x 7  days.  7. NSVT - cont BB.    LOS: 8 days    Dalton McLean 07/04/2012, 8:17 AM    

## 2012-07-05 NOTE — Interval H&P Note (Signed)
History and Physical Interval Note:  07/05/2012 9:13 AM  Cathy [name]  has presented today for surgery, with the diagnosis of NSTEMI  The various methods of treatment have been discussed with the patient and family. After consideration of risks, benefits and other options for treatment, the patient has consented to  Procedure(s): PERCUTANEOUS CORONARY STENT INTERVENTION (PCI-S) (N/A) as a surgical intervention .  The patient's history has been reviewed, patient examined, no change in status, stable for surgery.  I have reviewed the patient's chart and labs.  Questions were answered to the patient's satisfaction.   Patient seen and examined, history reviewed. Reviewed cardiac cath data including PTCA from 06/15/12. Patient has a large ramus intermediate which has a 95% stenosis in the mid vessel in a very tortuous segment. Prior attempt at stenting was unsuccessful due to inability to cross the lesion with a stent. She has occlusion of the LCx. The LIMA to the LAD is patent but the LAD distal to the graft is severely diseased. SVG to the RCA is patent. EF is 30%. I reviewed all this with the patient. She has refractory class IV angina despite aggressive medical therapy. I explained that repeat intervention is high risk including potential complications of abrupt closure, perforation, MI, arrhythmia, and death. Patient understands and wishes to proceed.  Theron Arista [facility] 07/05/2012 9:13 AM

## 2012-07-05 NOTE — Progress Notes (Signed)
Echo Lab  Limited 2D Echocardiogram completed.  Jadie Comas L Dacie Mandel, RDCS 07/05/2012 11:17 AM

## 2012-07-05 NOTE — Progress Notes (Signed)
Inpatient Diabetes Program Recommendations  AACE/ADA: New Consensus Statement on Inpatient Glycemic Control (2013)  Target Ranges:  Prepandial:   less than 140 mg/dL      Peak postprandial:   less than 180 mg/dL (1-2 hours)      Critically ill patients:  140 - 180 mg/dL  Results for MASHELL, SIEBEN (MRN [id number]) as of 07/05/2012 14:00  Ref. Range 07/04/2012 07:58 07/04/2012 12:11 07/04/2012 16:46 07/04/2012 22:13 07/05/2012 07:53 07/05/2012 12:14  Glucose-Capillary Latest Range: 70-99 mg/dL 409 (H) 811 (H) 914 (H) 433 (H) 362 (H) 283 (H)   Consider increasing Lantus to 50 units a.m. And 55 units p.m.  Also, Novolog with meals to 12 units   Thank you  Piedad Climes BSN, RN,CDE Inpatient Diabetes Coordinator [phone] (team pager)

## 2012-07-06 DIAGNOSIS — I509 Heart failure, unspecified: Secondary | ICD-10-CM

## 2012-07-06 DIAGNOSIS — I779 Disorder of arteries and arterioles, unspecified: Secondary | ICD-10-CM

## 2012-07-06 DIAGNOSIS — I2510 Atherosclerotic heart disease of native coronary artery without angina pectoris: Secondary | ICD-10-CM

## 2012-07-06 DIAGNOSIS — I5022 Chronic systolic (congestive) heart failure: Secondary | ICD-10-CM

## 2012-07-06 DIAGNOSIS — I2589 Other forms of chronic ischemic heart disease: Secondary | ICD-10-CM

## 2012-07-06 LAB — BASIC METABOLIC PANEL
CO2: 25 mEq/L (ref 19–32)
Calcium: 9.3 mg/dL (ref 8.4–10.5)
Chloride: 106 mEq/L (ref 96–112)
Creatinine, Ser: 0.97 mg/dL (ref 0.50–1.10)
GFR calc Af Amer: 62 mL/min — ABNORMAL LOW (ref >90)
Sodium: 137 mEq/L (ref 135–145)

## 2012-07-06 LAB — CBC
MCV: 88.6 fL (ref 78.0–100.0)
Platelets: 220 10*3/uL (ref 150–400)
RBC: 4.03 MIL/uL (ref 3.87–5.11)
RDW: 14.2 % (ref 11.5–15.5)
WBC: 12.6 10*3/uL — ABNORMAL HIGH (ref 4.0–10.5)

## 2012-07-06 LAB — GLUCOSE, CAPILLARY
Glucose-Capillary: 281 mg/dL — ABNORMAL HIGH (ref 70–99)
Glucose-Capillary: 218 mg/dL — ABNORMAL HIGH (ref 70–99)

## 2012-07-06 MED ORDER — ISOSORBIDE MONONITRATE ER 30 MG PO TB24
30.0000 mg | ORAL_TABLET | Freq: Every day | ORAL | Status: DC
  Administered 2012-07-06 – 2012-07-09 (×4): 30 mg via ORAL
  Filled 2012-07-06 (×4): qty 1

## 2012-07-06 MED ORDER — FENTANYL CITRATE 0.05 MG/ML IJ SOLN
25.0000 ug | Freq: Four times a day (QID) | INTRAMUSCULAR | Status: DC | PRN
  Administered 2012-07-06: 25 ug via INTRAVENOUS
  Filled 2012-07-06: qty 2

## 2012-07-06 MED ORDER — INSULIN ASPART 100 UNIT/ML ~~LOC~~ SOLN
12.0000 [IU] | Freq: Three times a day (TID) | SUBCUTANEOUS | Status: DC
  Administered 2012-07-06 – 2012-07-09 (×9): 12 [IU] via SUBCUTANEOUS

## 2012-07-06 MED ORDER — INSULIN GLARGINE 100 UNIT/ML ~~LOC~~ SOLN
55.0000 [IU] | Freq: Every day | SUBCUTANEOUS | Status: DC
  Administered 2012-07-06 – 2012-07-08 (×3): 55 [IU] via SUBCUTANEOUS
  Filled 2012-07-06 (×4): qty 0.55

## 2012-07-06 MED ORDER — HYDROCODONE-ACETAMINOPHEN 5-325 MG PO TABS
1.0000 | ORAL_TABLET | Freq: Three times a day (TID) | ORAL | Status: DC | PRN
  Administered 2012-07-06 – 2012-07-09 (×3): 1 via ORAL
  Filled 2012-07-06 (×3): qty 1

## 2012-07-06 MED ORDER — INSULIN GLARGINE 100 UNIT/ML ~~LOC~~ SOLN
50.0000 [IU] | Freq: Every day | SUBCUTANEOUS | Status: DC
  Administered 2012-07-06: 50 [IU] via SUBCUTANEOUS
  Filled 2012-07-06 (×2): qty 0.5

## 2012-07-06 MED FILL — Sodium Chloride IV Soln 0.9%: INTRAVENOUS | Qty: 50 | Status: AC

## 2012-07-06 NOTE — Progress Notes (Signed)
Paged Dr. Adolm Joseph about patient complaining of a pinching feeling similar to what she felt on the cath table when Dr. [place] had to hold pressure. Informed him that pulses where about +1 Bil, good skin color and warmth, groins soft with some tenderness over cath site. Vitals stable. Vicodan PRN was given. No new orders given at this time.

## 2012-07-06 NOTE — Progress Notes (Signed)
Patient states her pain is a 10/10 after Vicodin given prior. Patent offered Morphine and she states it does not work for her. Teola Bradley PA called, orders recieved

## 2012-07-06 NOTE — Progress Notes (Signed)
Report given to [employer], room 2927. Will transfer pt. Via WC

## 2012-07-06 NOTE — Progress Notes (Signed)
1340 Came to walk with pt. Pt states she cannot walk due to dizziness and left foot pain from neuropathy. Offered to put recliner in room and get pt to it but again she declined. Stated she is not up to it and the pain shoots up her leg. Will follow up tomorrow. Luetta Nutting RNBSN

## 2012-07-06 NOTE — Progress Notes (Addendum)
Patient: Cathy [name] / Admit Date: 06/26/2012 / Date of Encounter: 07/06/2012, 8:37 AM   Subjective  Denies CP or SOB - feels well from that perspective. Has chronic peripheral neuropathy in leg.   Objective   Telemetry: NSR occ pvcs Physical Exam: Filed Vitals:   07/06/12 0753  BP: 111/45  Pulse: 66  Temp: 98.4 F (36.9 C)  Resp: 22, 94% RA   General: Well developed, well nourished WF in no acute distress. Head: Normocephalic, atraumatic, sclera non-icteric, no xanthomas, nares are without discharge. Neck: JVD not elevated. Lungs: Clear bilaterally to auscultation without wheezes, rales, or rhonchi. Breathing is unlabored. Heart: RRR S1 S2 without murmurs, rubs, or gallops.  Abdomen: Soft, non-tender, non-distended with normoactive bowel sounds. No hepatomegaly. No rebound/guarding. No obvious abdominal masses. Msk:  Strength and tone appear normal for age. Extremities: No clubbing or cyanosis. No edema.  Distal pedal pulses are 2+ and equal bilaterally. L groin with mild ecchymosis, no hematoma, bruit, or suppuration Neuro: Alert and oriented X 3. Moves all extremities spontaneously. Psych:  Responds to questions appropriately with a normal affect.    Intake/Output Summary (Last 24 hours) at 07/06/12 0837 Last data filed at 07/06/12 0800  Gross per 24 hour  Intake 1842.3 ml  Output    975 ml  Net  867.3 ml    Inpatient Medications:  . amLODipine  5 mg Oral Daily  . aspirin EC  81 mg Oral Daily  . carvedilol  25 mg Oral BID WC  . ciprofloxacin  500 mg Oral BID  . clopidogrel  75 mg Oral q morning - 10a  . feeding supplement  237 mL Oral BID BM  . fenofibrate  160 mg Oral Daily  . folic acid  1 mg Oral q morning - 10a  . furosemide  40 mg Oral Daily  . insulin aspart  0-15 Units Subcutaneous TID WC  . insulin aspart  10 Units Subcutaneous TID WC  . insulin glargine  45 Units Subcutaneous Daily  . insulin glargine  50 Units Subcutaneous QHS  . levothyroxine  25 mcg  Oral QAC breakfast  . omega-3 acid ethyl esters  2 g Oral BID  . ramipril  2.5 mg Oral BID    Labs:  Recent Labs  07/05/12 0534 07/06/12 0433  NA 134* 137  K 4.7 4.2  CL 100 106  CO2 23 25  GLUCOSE 384* 323*  BUN 21 25*  CREATININE 0.88 0.97  CALCIUM 9.8 9.3    Recent Labs  07/05/12 0534 07/06/12 0433  WBC 12.0* 12.6*  HGB 13.7 11.7*  HCT 41.6 35.7*  MCV 88.5 88.6  PLT 285 220     Radiology/Studies:  Dg Chest Port 1 View  06/26/2012   *RADIOLOGY REPORT*  Clinical Data: Chest pain  PORTABLE CHEST - 1 VIEW  Comparison: Prior chest x-ray 05/27/2012  Findings: Stable appearance of the chest with cardiomegaly and mild left atrial appendage enlargement.  The patient status post median sternotomy with evidence of prior multivessel CABG including LIMA bypass.  Atherosclerotic calcifications noted in the transverse aorta.  There may be trace bibasilar subsegmental atelectasis as seen on prior studies.  No pulmonary edema, pneumothorax, large effusion or focal airspace consolidation.  Advanced degenerative changes of bilateral glenohumeral joints consistent with osteoarthritis.  No acute osseous abnormality.  IMPRESSION:  No acute cardiopulmonary disease, stable chest x-ray.   Original Report Authenticated By: Malachy Moan, M.D.     Assessment and Plan  1. CAD -  NSTEMI this admission. Initial medical management in the setting of prior failed ramus intervention and diffuse/branch vessel disease not amenable to PCI. Unable to afford Ranexa even with help. Has been on aggressive anti-anginal management with nitrates, amlodipine, and Coreg but still had pain including at rest. Films were extensively reviewed/discussed - she went on to have PCI/DES of ramus intermediate yesterday. - Continue amlodipine, Coreg, ASA, Plavix - She does not tolerate statins - cardiac rehab 2. ICM EF 30-35% on echo 03/2012 - Continue Coreg and ramipril.  3. HTN - stable.  4. DM with chronic peripheral  neuropathy - Increase insulin per diabetes coordinator recs. 5. Carotid artery disease - 80-99 R prox ICA 04/2012 - per vascular notes, Dr. Myrtis [name] to decide when patient is medically stable from cardiac standpoint to proceed with carotid endarterectomy.  6. Pseudomonas UTI - pansensitive. Cipro 500mg  BID x 7 days.  7. NSVT - cont BB.  8. Leukocytosis - ? Related to intervention. Afebrile. Continue Cipro as above.  Transfer to stepdown. EKG reviewed with Md. Further plans per MD. Cardiac rehab.  Signed, Cathy Spies PA-C  I have personally seen and examined this patient with Cathy Spies, PA-C. I agree with the assessment and plan as outlined above. She is post PCI and doing well. Hemodynamically stable. Continue current meds. Transfer to step-down today.   [name],Cathy 9:08 AM 07/06/2012

## 2012-07-07 DIAGNOSIS — E1142 Type 2 diabetes mellitus with diabetic polyneuropathy: Secondary | ICD-10-CM

## 2012-07-07 DIAGNOSIS — E1149 Type 2 diabetes mellitus with other diabetic neurological complication: Secondary | ICD-10-CM

## 2012-07-07 LAB — CBC
MCH: 29 pg (ref 26.0–34.0)
MCV: 88.8 fL (ref 78.0–100.0)
Platelets: 230 10*3/uL (ref 150–400)
RDW: 14.7 % (ref 11.5–15.5)
WBC: 9.8 10*3/uL (ref 4.0–10.5)

## 2012-07-07 LAB — GLUCOSE, CAPILLARY: Glucose-Capillary: 240 mg/dL — ABNORMAL HIGH (ref 70–99)

## 2012-07-07 LAB — BASIC METABOLIC PANEL
CO2: 28 mEq/L (ref 19–32)
Calcium: 9.4 mg/dL (ref 8.4–10.5)
Creatinine, Ser: 0.95 mg/dL (ref 0.50–1.10)

## 2012-07-07 MED ORDER — AMIODARONE HCL IN DEXTROSE 360-4.14 MG/200ML-% IV SOLN
30.0000 mg/h | INTRAVENOUS | Status: DC
  Administered 2012-07-07 – 2012-07-08 (×2): 30 mg/h via INTRAVENOUS
  Filled 2012-07-07 (×4): qty 200

## 2012-07-07 MED ORDER — AMIODARONE LOAD VIA INFUSION
150.0000 mg | Freq: Once | INTRAVENOUS | Status: AC
  Administered 2012-07-07: 150 mg via INTRAVENOUS
  Filled 2012-07-07: qty 83.34

## 2012-07-07 MED ORDER — AMIODARONE HCL IN DEXTROSE 360-4.14 MG/200ML-% IV SOLN
60.0000 mg/h | INTRAVENOUS | Status: AC
  Administered 2012-07-07 (×2): 60 mg/h via INTRAVENOUS
  Filled 2012-07-07 (×2): qty 200

## 2012-07-07 MED ORDER — INSULIN GLARGINE 100 UNIT/ML ~~LOC~~ SOLN
55.0000 [IU] | Freq: Every day | SUBCUTANEOUS | Status: DC
  Administered 2012-07-07 – 2012-07-09 (×3): 55 [IU] via SUBCUTANEOUS
  Filled 2012-07-07 (×3): qty 0.55

## 2012-07-07 NOTE — Plan of Care (Signed)
Problem: Phase II Progression Outcomes Goal: Ambulates up to 600 ft. in hall x 1 Outcome: Not Progressing Pt is refusing to ambulate due to foot pain. Up to bedside commode is as much activity that she will tolerate.

## 2012-07-07 NOTE — Progress Notes (Signed)
Upon arrival to 2023 pt HR up to 140's a-fib on monitor; PA paged to make aware; will await callback.

## 2012-07-07 NOTE — Progress Notes (Addendum)
PA paged at this time; BP 98/62; will cont. To monitor; previous SBP 86.

## 2012-07-07 NOTE — Progress Notes (Signed)
Amio bolus given at this time; amio gtt to be administered after bolus; pt asymptomatic; O2 2L Laura applied; BP stable; will cont. To monitor.

## 2012-07-07 NOTE — Progress Notes (Signed)
CARDIAC REHAB PHASE I   PRE:  Rate/Rhythm: 71 SR  BP:  Supine: 114/50  Sitting:   Standing:    SaO2:   MODE:  Ambulation: 194 ft   POST:  Rate/Rhythm: 84 SR  BP:  Supine: 132/59  Sitting:   Standing:    SaO2: 96 RA 1135-1200 Assisted X 1 and used walker to ambulate. Gait steady with walker. Pt tires easily and is DOE. RA sat after walk 96%. Pt c/o of being tired by end of walk denies any cp. Back to bed after walk with call light in reach. She states that the pain her foot is much better today.  Melina Copa RN 07/07/2012 11:56 AM   71

## 2012-07-07 NOTE — Progress Notes (Signed)
TELEMETRY: Reviewed telemetry pt in NSR with occ. PVC couplets.  Filed Vitals:   07/07/12 0400 07/07/12 0415 07/07/12 0500 07/07/12 0600  BP: 120/42  103/66 131/40  Pulse: 63  62 59  Temp:  98.7 F (37.1 C)    TempSrc:  Oral    Resp: 16  19 18   Height:      Weight:      SpO2: 94%  92% 92%    Intake/Output Summary (Last 24 hours) at 07/07/12 0721 Last data filed at 07/07/12 0600  Gross per 24 hour  Intake    970 ml  Output    950 ml  Net     20 ml    SUBJECTIVE Complained of severe neuropathic pain in left leg and foot yesterday. Did not ambulate. Leg feels better today. Walks with cane at home. No chest pain.  LABS: Basic Metabolic Panel:  Recent Labs  41/32/44 0433 07/07/12 0424  NA 137 136  K 4.2 4.0  CL 106 103  CO2 25 28  GLUCOSE 323* 282*  BUN 25* 22  CREATININE 0.97 0.95  CALCIUM 9.3 9.4   CBC:  Recent Labs  07/06/12 0433 07/07/12 0424  WBC 12.6* 9.8  HGB 11.7* 12.2  HCT 35.7* 37.4  MCV 88.6 88.8  PLT 220 230   Radiology/Studies:  Dg Chest Port 1 View  06/26/2012   *RADIOLOGY REPORT*  Clinical Data: Chest pain  PORTABLE CHEST - 1 VIEW  Comparison: Prior chest x-ray 05/27/2012  Findings: Stable appearance of the chest with cardiomegaly and mild left atrial appendage enlargement.  The patient status post median sternotomy with evidence of prior multivessel CABG including LIMA bypass.  Atherosclerotic calcifications noted in the transverse aorta.  There may be trace bibasilar subsegmental atelectasis as seen on prior studies.  No pulmonary edema, pneumothorax, large effusion or focal airspace consolidation.  Advanced degenerative changes of bilateral glenohumeral joints consistent with osteoarthritis.  No acute osseous abnormality.  IMPRESSION:  No acute cardiopulmonary disease, stable chest x-ray.   Original Report Authenticated By: Malachy Moan, M.D.    PHYSICAL EXAM General: Well developed, well nourished, in no acute distress. Head:  Normocephalic, atraumatic, sclera non-icteric, no xanthomas, nares are without discharge. Neck: Negative for carotid bruits. JVD not elevated. Lungs: Clear bilaterally to auscultation without wheezes, rales, or rhonchi. Breathing is unlabored. Heart: RRR S1 S2 without murmurs, rubs, or gallops.  Abdomen: Soft, non-tender, non-distended with normoactive bowel sounds. No hepatomegaly. No rebound/guarding. No obvious abdominal masses. Msk:  Strength and tone appears normal for age. Extremities: No clubbing, cyanosis or edema.  Distal pedal pulses are 2+ and equal bilaterally. Both groins without hematoma.  Neuro: Alert and oriented X 3. Moves all extremities spontaneously. Psych:  Responds to questions appropriately with a normal affect.  ASSESSMENT AND PLAN: 1. NSTEMI. S/p stenting of the ramus intermediate branch with DES. No recurrent angina. Continue amlodipine, ASA, plavix, and Imdur. Plan to transfer to telemetry today and ambulate. Will consult PT. 2. Ischemic cardiomyopathy EF 30-35%- no overt CHF on coreg and ramipril. 3. HTN controlled. 4. DM with peripheral neuopathy. Pain better today. Will ask PT to evaluate. Home when ambulatory. Will increase lantus to 55 units bid, SSI 5. Pseudomonas UTI- on Cipro 6. Carotid artery disease. 80-99% proximal RICA stenosis. With recent cardiac event will need to determine optimal timing of intervention. Would favor waiting at least one month and optimally 3 months post PCI.   Active Problems:   S/P CABG (coronary artery bypass  graft)   Statin intolerance   DM (diabetes mellitus), type 2 with neurological complications   Hypothyroidism   Cardiomyopathy, ischemic   UTI (urinary tract infection)   Carotid artery disease   CAD (coronary artery disease)   Non-STEMI (non-ST elevated myocardial infarction)    Signed, Peter [place] MD,FACC 07/07/2012 7:29 AM

## 2012-07-08 DIAGNOSIS — I4891 Unspecified atrial fibrillation: Secondary | ICD-10-CM

## 2012-07-08 DIAGNOSIS — I10 Essential (primary) hypertension: Secondary | ICD-10-CM

## 2012-07-08 LAB — GLUCOSE, CAPILLARY
Glucose-Capillary: 191 mg/dL — ABNORMAL HIGH (ref 70–99)
Glucose-Capillary: 306 mg/dL — ABNORMAL HIGH (ref 70–99)

## 2012-07-08 LAB — BASIC METABOLIC PANEL
BUN: 20 mg/dL (ref 6–23)
CO2: 26 mEq/L (ref 19–32)
Chloride: 101 mEq/L (ref 96–112)
Creatinine, Ser: 1.02 mg/dL (ref 0.50–1.10)
Glucose, Bld: 197 mg/dL — ABNORMAL HIGH (ref 70–99)

## 2012-07-08 MED ORDER — AMIODARONE HCL 200 MG PO TABS
200.0000 mg | ORAL_TABLET | Freq: Two times a day (BID) | ORAL | Status: DC
  Administered 2012-07-08 – 2012-07-09 (×3): 200 mg via ORAL
  Filled 2012-07-08 (×4): qty 1

## 2012-07-08 NOTE — Progress Notes (Signed)
CARDIAC REHAB PHASE I   PRE:  Rate/Rhythm: 71SR  BP:  Supine:   Sitting: 110/50  Standing:    SaO2: 95%RA  MODE:  Ambulation: 150 ft   POST:  Rate/Rhythm: 77SR 100/50 BP:  Supine:   Sitting:   Standing:    SaO2: 96%RA 1325-1350 Pt walked to bathroom and then 150 ft on RA with rolling walker and asst x 1. Tired by end of walk. C/o back and neck hurting. Disappointed that she did not get to go home. Stopped twice to rest. To bed after walk.no CP.   Luetta Nutting, RN BSN  07/08/2012 1:44 PM

## 2012-07-08 NOTE — Progress Notes (Addendum)
SUBJECTIVE: Feels great. No chest pain or SOB. Leg feels better. Atrial fibrillation yesterday.   BP 113/99  Pulse 117  Temp(Src) 97.5 F (36.4 C) (Oral)  Resp 18  Ht 5\' 5"  (1.651 m)  Wt 189 lb 13.1 oz (86.1 kg)  BMI 31.59 kg/m2  SpO2 96%  Intake/Output Summary (Last 24 hours) at 07/08/12 0658 Last data filed at 07/08/12 0500  Gross per 24 hour  Intake 1513.84 ml  Output   2650 ml  Net -1136.16 ml    PHYSICAL EXAM General: Well developed, well nourished, in no acute distress. Alert and oriented x 3.  Psych:  Good affect, responds appropriately Neck: No JVD. No masses noted.  Lungs: Clear bilaterally with no wheezes or rhonci noted.  Heart: RRR with no murmurs noted. Abdomen: Bowel sounds are present. Soft, non-tender.  Extremities: No lower extremity edema.   LABS: Basic Metabolic Panel:  Recent Labs  13/24/40 0424 07/08/12 0520  NA 136 135  K 4.0 3.9  CL 103 101  CO2 28 26  GLUCOSE 282* 197*  BUN 22 20  CREATININE 0.95 1.02  CALCIUM 9.4 9.4   CBC:  Recent Labs  07/06/12 0433 07/07/12 0424  WBC 12.6* 9.8  HGB 11.7* 12.2  HCT 35.7* 37.4  MCV 88.6 88.8  PLT 220 230   Current Meds: . amLODipine  5 mg Oral Daily  . aspirin EC  81 mg Oral Daily  . carvedilol  25 mg Oral BID WC  . clopidogrel  75 mg Oral q morning - 10a  . feeding supplement  237 mL Oral BID BM  . fenofibrate  160 mg Oral Daily  . folic acid  1 mg Oral q morning - 10a  . furosemide  40 mg Oral Daily  . insulin aspart  0-15 Units Subcutaneous TID WC  . insulin aspart  12 Units Subcutaneous TID WC  . insulin glargine  55 Units Subcutaneous QHS  . insulin glargine  55 Units Subcutaneous Daily  . isosorbide mononitrate  30 mg Oral Daily  . levothyroxine  25 mcg Oral QAC breakfast  . omega-3 acid ethyl esters  2 g Oral BID  . ramipril  2.5 mg Oral BID     ASSESSMENT AND PLAN:  1. NSTEMI: S/p stenting of the ramus intermediate branch with DES. No recurrent angina. Continue  amlodipine, ASA, plavix, and Imdur.   2. Ischemic cardiomyopathy:  EF 30-35%. Continue coreg and ramipril.   3. HTN: BP controlled.   4. DM with peripheral neuopathy:  Pain better today. SSI. Continue current regimen.   5. Pseudomonas UTI: Completed course of Cipro. No dysuria.   6. Carotid artery disease:  80-99% proximal RICA stenosis. With recent cardiac event will need to determine optimal timing of intervention. Would favor waiting at least one month and optimally 3 months post PCI. Can be decided at f/u with Dr. Myrtis Ser.   7. Atrial fibrillation: Onset yesterday with RVR. Amiodarone drip started. Now back in sinus. No previous atrial fibrillation noted. Will convert to po amiodarone today. CHADs score of 4. Will need to discuss long term anti-coagulation as her risk of CVA is high. After review with EP colleagues, it is felt that the best plan is to change to po amiodarone today and monitor x 24 more hours. Her risk of bleeding will be high with any anti-coagulation especially in light of coronary artery perforation earlier this week. If she remains in sinus today, will d/c home tomorrow on po amiodarone  and arrange 21 day monitor. If she has recurrent atrial fibrillation, will need to start Eliquis for anti-coagulation. Will check P2Y12 today. If we do need to start anti-coagulation, hopefully will be able to d/c ASA to avoid triple therapy with ASA/Plavix and Eliquis.   MCALHANY,CHRISTOPHER  6/19/20146:58 AM

## 2012-07-08 NOTE — Progress Notes (Signed)
Pt c/o anxiety; pt given 0.25mg  PO Xanax at this time; will cont. To monitor.

## 2012-07-08 NOTE — Evaluation (Signed)
Physical Therapy Evaluation Patient Details Name: CALIYAH SIEH MRN: [id number] DOB: 04-01-1930 Today's Date: 07/08/2012 Time: 4098-1191 PT Time Calculation (min): 16 min  PT Assessment / Plan / Recommendation Clinical Impression  Pt admitted with NSTEMI, UTI and AFib. Pt with limited mobility stating she felt sick but unable to describe with HR maintaining 79 NSR. Pt deconditioned with assist at home and states she will have 24hr care and that she was receiving HHPT PTA and plans to return to that. If 24hr supervision then pt safe to discharge home with recommendation of RW use not quad cane and daily moblity with nursing. Will follow acutely to maximize function, activity tolerance and safety.     PT Assessment  Patient needs continued PT services    Follow Up Recommendations  Home health PT;Supervision for mobility/OOB    Does the patient have the potential to tolerate intense rehabilitation      Barriers to Discharge None      Equipment Recommendations  None recommended by PT    Recommendations for Other Services     Frequency Min 3X/week    Precautions / Restrictions Precautions Precautions: Fall Restrictions Weight Bearing Restrictions: No   Pertinent Vitals/Pain No pain      Mobility  Bed Mobility Bed Mobility: Supine to Sit Supine to Sit: 6: Modified independent (Device/Increase time);With rails;HOB elevated Transfers Transfers: Sit to Stand;Stand to Sit Sit to Stand: 5: Supervision;From bed;From toilet Stand to Sit: 5: Supervision;To toilet;To chair/3-in-1 Details for Transfer Assistance: cuieng for hand placement and safety Ambulation/Gait Ambulation/Gait Assistance: 4: Min guard Ambulation Distance (Feet): 20 Feet (x2 trials) Assistive device: Rolling walker Ambulation/Gait Assistance Details: pt maintaining flexed posture with self posterior to RW despite cueing and assist to maneuver around obstacles Gait Pattern: Shuffle;Trunk flexed Gait velocity:  decreased Stairs: No    Exercises     PT Diagnosis: Difficulty walking  PT Problem List: Decreased activity tolerance;Decreased mobility;Decreased safety awareness;Decreased knowledge of use of DME PT Treatment Interventions: Gait training;DME instruction;Stair training;Functional mobility training;Therapeutic activities;Therapeutic exercise;Patient/family education   PT Goals Acute Rehab PT Goals PT Goal Formulation: With patient Time For Goal Achievement: 07/22/12 Potential to Achieve Goals: Good Pt will go Sit to Stand: with modified independence PT Goal: Sit to Stand - Progress: Goal set today Pt will go Stand to Sit: with modified independence PT Goal: Stand to Sit - Progress: Goal set today Pt will Ambulate: 51 - 150 feet;with least restrictive assistive device;with supervision PT Goal: Ambulate - Progress: Goal set today Pt will Go Up / Down Stairs: 1-2 stairs;with least restrictive assistive device;with supervision PT Goal: Up/Down Stairs - Progress: Goal set today  Visit Information  Last PT Received On: 07/08/12 Assistance Needed: +1    Subjective Data  Subjective: I''m not doing so well cause I haven't had any rest Patient Stated Goal: go home with help   Prior Functioning  Home Living Lives With: Alone Available Help at Discharge: Family;Available 24 hours/day (pt states niece will stay with her at [place]) Type of Home: Mobile home Home Access: Stairs to enter Entrance Stairs-Number of Steps: 1 Entrance Stairs-Rails: None Home Layout: One level Bathroom Shower/Tub: Engineer, manufacturing systems: Standard Home Adaptive Equipment: Raised toilet seat with rails;Quad cane;Walker - rolling;Walker - standard Prior Function Level of Independence: Needs assistance Needs Assistance: Light Housekeeping;Bathing;Meal Prep Bath: Moderate Meal Prep: Moderate Light Housekeeping: Total Able to Take Stairs?: Yes Driving: No Vocation: Retired Musician:  No difficulties    Copywriter, advertising Arousal/Alertness: Awake/alert  Behavior During Therapy: WFL for tasks assessed/performed Overall Cognitive Status: Impaired/Different from baseline Area of Impairment: Safety/judgement Safety/Judgement: Decreased awareness of safety    Extremity/Trunk Assessment Right Lower Extremity Assessment RLE ROM/Strength/Tone: Deficits RLE ROM/Strength/Tone Deficits: grossly 3/5 not formally assessed RLE Sensation: History of peripheral neuropathy Left Lower Extremity Assessment LLE ROM/Strength/Tone: Deficits LLE ROM/Strength/Tone Deficits: grossly 3/5 not formally assessed LLE Sensation: History of peripheral neuropathy Trunk Assessment Trunk Assessment: Kyphotic   Balance    End of Session PT - End of Session Activity Tolerance: Other (comment) (pt reports feeling "sick" unable to describe) Patient left: in chair;with call bell/phone within reach Nurse Communication: Mobility status  GP     Delorse Lek 07/08/2012, 9:00 AM Delaney Meigs, PT [phone]

## 2012-07-08 NOTE — Progress Notes (Signed)
Inpatient Diabetes Program Recommendations  AACE/ADA: New Consensus Statement on Inpatient Glycemic Control (2013)  Target Ranges:  Prepandial:   less than 140 mg/dL      Peak postprandial:   less than 180 mg/dL (1-2 hours)      Critically ill patients:  140 - 180 mg/dL   Reason for Visit: Hyperglycemia  Results for NOELA, BROTHERS (MRN [id number]) as of 07/08/2012 16:07  Ref. Range 07/08/2012 05:56 07/08/2012 11:25 07/08/2012 15:51  Glucose-Capillary Latest Range: 70-99 mg/dL 409 (H) 811 (H) 914 (H)   Blood sugars still running high post prandial.  Recommendations: Increase Novolog to 15 units tidwc if pt eats >50% meal.  Will continue to follow.  Thank you. Ailene Ards, RD, LDN, CDE Inpatient Diabetes Coordinator [phone]

## 2012-07-09 ENCOUNTER — Telehealth: Payer: Self-pay | Admitting: Physician Assistant

## 2012-07-09 ENCOUNTER — Encounter (HOSPITAL_COMMUNITY): Payer: Self-pay | Admitting: Physician Assistant

## 2012-07-09 ENCOUNTER — Other Ambulatory Visit: Payer: Self-pay | Admitting: Physician Assistant

## 2012-07-09 ENCOUNTER — Other Ambulatory Visit: Payer: Self-pay | Admitting: *Deleted

## 2012-07-09 DIAGNOSIS — N390 Urinary tract infection, site not specified: Secondary | ICD-10-CM

## 2012-07-09 DIAGNOSIS — I4891 Unspecified atrial fibrillation: Secondary | ICD-10-CM

## 2012-07-09 LAB — BASIC METABOLIC PANEL
BUN: 20 mg/dL (ref 6–23)
CO2: 27 mEq/L (ref 19–32)
Chloride: 104 mEq/L (ref 96–112)
Glucose, Bld: 225 mg/dL — ABNORMAL HIGH (ref 70–99)
Potassium: 3.9 mEq/L (ref 3.5–5.1)

## 2012-07-09 LAB — GLUCOSE, CAPILLARY: Glucose-Capillary: 180 mg/dL — ABNORMAL HIGH (ref 70–99)

## 2012-07-09 LAB — CBC
HCT: 37.6 % (ref 36.0–46.0)
Hemoglobin: 12.3 g/dL (ref 12.0–15.0)
MCH: 28.5 pg (ref 26.0–34.0)
MCHC: 32.7 g/dL (ref 30.0–36.0)
MCV: 87 fL (ref 78.0–100.0)

## 2012-07-09 MED ORDER — TICAGRELOR 90 MG PO TABS
180.0000 mg | ORAL_TABLET | Freq: Once | ORAL | Status: AC
  Administered 2012-07-09: 180 mg via ORAL
  Filled 2012-07-09: qty 2

## 2012-07-09 MED ORDER — AMLODIPINE BESYLATE 5 MG PO TABS: 5.0000 mg | ORAL_TABLET | Freq: Every day | ORAL | Status: DC

## 2012-07-09 MED ORDER — TICAGRELOR 90 MG PO TABS
90.0000 mg | ORAL_TABLET | Freq: Two times a day (BID) | ORAL | Status: DC
  Filled 2012-07-09: qty 1

## 2012-07-09 MED ORDER — TICAGRELOR 90 MG PO TABS: 90.0000 mg | ORAL_TABLET | Freq: Two times a day (BID) | ORAL | Status: DC

## 2012-07-09 MED ORDER — INSULIN GLARGINE 100 UNIT/ML ~~LOC~~ SOLN: 55.0000 [IU] | Freq: Two times a day (BID) | SUBCUTANEOUS | Status: DC

## 2012-07-09 MED ORDER — AMIODARONE HCL 200 MG PO TABS: 200.0000 mg | ORAL_TABLET | Freq: Two times a day (BID) | ORAL | Status: DC

## 2012-07-09 MED ORDER — CARVEDILOL 25 MG PO TABS: 25.0000 mg | ORAL_TABLET | Freq: Two times a day (BID) | ORAL | Status: DC

## 2012-07-09 MED ORDER — INSULIN LISPRO 100 UNIT/ML ~~LOC~~ SOLN: 15.0000 [IU] | Freq: Three times a day (TID) | SUBCUTANEOUS | Status: DC

## 2012-07-09 NOTE — Discharge Summary (Signed)
Patient seen and examined and history reviewed. Agree with above findings and plan. See prior rounding note.  Thedora Hinders 07/09/2012 3:47 PM

## 2012-07-09 NOTE — Telephone Encounter (Signed)
TCM -d/c [facility] 6-20 will have appointment on 6/25 with Gene  Per Dayna please order 21 day Event monitor.

## 2012-07-09 NOTE — Progress Notes (Signed)
Patient has small red harden area on right forearm. Elevated on pillow and warm compress applied.

## 2012-07-09 NOTE — Progress Notes (Signed)
CARDIAC REHAB PHASE I   PRE:  Rate/Rhythm: 78SR  BP:  Supine:   Sitting: 104/60  Standing:    SaO2:   MODE:  Ambulation: 150 ft   POST:  Rate/Rhythm: 84SR  BP:  Supine: 100/58  Sitting:   Standing:    SaO2:  1435-1500 Pt walked 150 ft with rolling walker and gait belt use. Tired by end of walk. No dizziness or CP. To bed with alarm on. Ready to go home.   Luetta Nutting, RN BSN  07/09/2012 2:57 PM

## 2012-07-09 NOTE — Progress Notes (Signed)
TELEMETRY: Reviewed telemetry pt in NSR with occ. PVC couplets.  Filed Vitals:   07/08/12 1330 07/08/12 2004 07/09/12 0300 07/09/12 0526  BP: 111/56 112/62  110/64  Pulse: 72 67  68  Temp: 97.8 F (36.6 C) 99.2 F (37.3 C)  98.7 F (37.1 C)  TempSrc: Oral Oral  Oral  Resp: 18 19  18   Height:      Weight:   184 lb 11.9 oz (83.8 kg)   SpO2: 99% 94%  97%    Intake/Output Summary (Last 24 hours) at 07/09/12 1116 Last data filed at 07/09/12 1052  Gross per 24 hour  Intake   1680 ml  Output   3351 ml  Net  -1671 ml    SUBJECTIVE Feeling well today. Anxious to go home. Progressing well with PT. No recurrent Afib noted.  LABS: Basic Metabolic Panel:  Recent Labs  56/21/30 0520 07/09/12 0600  NA 135 138  K 3.9 3.9  CL 101 104  CO2 26 27  GLUCOSE 197* 225*  BUN 20 20  CREATININE 1.02 0.96  CALCIUM 9.4 9.5   CBC:  Recent Labs  07/07/12 0424 07/09/12 0600  WBC 9.8 11.1*  HGB 12.2 12.3  HCT 37.4 37.6  MCV 88.8 87.0  PLT 230 259   Radiology/Studies:  Dg Chest Port 1 View  06/26/2012   *RADIOLOGY REPORT*  Clinical Data: Chest pain  PORTABLE CHEST - 1 VIEW  Comparison: Prior chest x-ray 05/27/2012  Findings: Stable appearance of the chest with cardiomegaly and mild left atrial appendage enlargement.  The patient status post median sternotomy with evidence of prior multivessel CABG including LIMA bypass.  Atherosclerotic calcifications noted in the transverse aorta.  There may be trace bibasilar subsegmental atelectasis as seen on prior studies.  No pulmonary edema, pneumothorax, large effusion or focal airspace consolidation.  Advanced degenerative changes of bilateral glenohumeral joints consistent with osteoarthritis.  No acute osseous abnormality.  IMPRESSION:  No acute cardiopulmonary disease, stable chest x-ray.   Original Report Authenticated By: Malachy Moan, M.D.    PHYSICAL EXAM General: Well developed, well nourished, in no acute distress. Head:  Normocephalic, atraumatic, sclera non-icteric, no xanthomas, nares are without discharge. Neck: Negative for carotid bruits. JVD not elevated. Lungs: Clear bilaterally to auscultation without wheezes, rales, or rhonchi. Breathing is unlabored. Heart: RRR S1 S2 without murmurs, rubs, or gallops.  Abdomen: Soft, non-tender, non-distended with normoactive bowel sounds. No hepatomegaly. No rebound/guarding. No obvious abdominal masses. Msk:  Strength and tone appears normal for age. Extremities: No clubbing, cyanosis or edema.  Distal pedal pulses are 2+ and equal bilaterally. Both groins without hematoma.  Neuro: Alert and oriented X 3. Moves all extremities spontaneously. Psych:  Responds to questions appropriately with a normal affect.  ASSESSMENT AND PLAN: 1. NSTEMI. S/p stenting of the ramus intermediate branch with DES. No recurrent angina. Continue amlodipine, ASA,  and Imdur. P2Y12 only 233 consistent with hyporesponsiveness to plavix. Will switch to Brilinta. Plan to DC home today.  2. Ischemic cardiomyopathy EF 30-35%- no overt CHF on coreg and ramipril. 3. HTN controlled. 4. DM with peripheral neuopathy. Pain better today. Will ask PT to evaluate. Home when ambulatory. Will increase lantus to 55 units bid, SSI 5. Pseudomonas UTI- complete Cipro 6. Carotid artery disease. 80-99% proximal RICA stenosis. With recent cardiac event will need to determine optimal timing of intervention. Would favor waiting at least one month and optimally 3 months post PCI. 7. Paroxysmal atrial fibrillation. Only one episode noted during  this hospital stay. Plan po amiodarone. Would arrange outpatient monitor 21 day to rule out recurrent episodes. Would not anticoagulation unless she has significant break through. 24 hour Holter would be inadequate to assess intermittent break through.   Active Problems:   S/P CABG (coronary artery bypass graft)   Statin intolerance   DM (diabetes mellitus), type 2 with  neurological complications   Hypothyroidism   Cardiomyopathy, ischemic   UTI (urinary tract infection)   Carotid artery disease   CAD (coronary artery disease)   Non-STEMI (non-ST elevated myocardial infarction)   Atrial fibrillation    Signed, Maize Brittingham [place] MD,FACC 07/09/2012 11:16 AM

## 2012-07-09 NOTE — Discharge Summary (Signed)
Discharge Summary   Patient ID: ARDYN FORGE MRN: [id number], DOB/AGE: Oct 22, 1930 79 y.o. Admit date: 06/26/2012 D/C date:     07/09/2012  Primary Cardiologist: Myrtis Ser  Primary Discharge Diagnoses:  1. CAD with NSTEMI this admission - s/p stenting of the ramus intermediate branch with DES after failing medical therapy (procedure complicated by vessel perforation which resolved with prolonged balloon inflation and stenting) - history: CABG 2003, cath 2006 occluded OM vein graft, anterior MI 04/15/12 s/p PTCA to ramus - Plavix hyporesponsiveness, changed to Brilinta  2. Ischemic cardiomyopathy EF 30-35%/chronic systolic CHF - euvolemic this admission 3. HTN 4. DM with peripheral neuopathy 5. Pseudomonas UTI, completed course of Cipro  6. Carotid artery disease with 80-99% proximal RICA stenosis - with recent cardiac event will need to determine optimal timing of intervention, favor waiting at least one month and optimally 3 months post PCI 7. Transient rapid atrial fibrillation - discharged on oral amiodarone, not anticoagulating unless she has significant breakthrough on event monitor 8. Hyperlipidemia with history of statin intolerance 9. NSVT   Secondary Discharge Diagnoses:  1. H/o aneurysm - Ascending thoracic aneurysm repair with a graft at time of CABG 2003 2. Gastroparesis 3. Contrast dye allergy 4. Previous back surgery 5. Question of syncope 2008 6. Hypotension 10/2011 7. Hypothyroidism 8. CKD stage III 9. Hypertriglyceridemia  Hospital Course: Ms. Vitelli is an 79 y/o F with history of CAD and thoracic aortic aneurysm repair along at time of CABG in 2003, carotid artery disease, DM, ICM who presented to [facility] on 06/26/12 with chest pain and found to have an NSTEMI. Several months ago in March 2014, she had PTCA to her ramus in setting of anterior MI (due to tortuous anatomy, stenting was unsuccessful at that time).  This admission, she presented with complaints  of exertional chest discomfort that felt like her prior angina over the course of the past few weeks. On day of admission, the pain came to an 8/10 and went to her back so she came to the ER. She got some relief in the emergency room with nitroglycerin and morphine. Initially it was called a code STEMI but her ECG was thought to be essentially unchanged. First troponin was negative but later rose to 0.54. She was placed on IV heparin, and BB/Imdur were titrated. Her cath films were reviewed and at first, the decision was made to treat her medically given her difficult anatomy. She was placed on Ranexa and did well, but she later refused this medicine as she could not afford it even with assistance. Brief NSVT was noted on telemetry and BB was titrated. She initially remained stable with medical management, but then developed significant chest pain and diaphoresis at rest concerning for recurrent angina. Heparin was restarted. Troponins fortunately remained negative with that event. She was placed on IV nitroglycerin in attempt to cool her off over the weekend. Films were again reviewed and the decision was made to attempt PCI. She was premedicated for her contrast allergy. On 07/05/12, she subsequently underwent successful intracoronary stenting of the ramus intermediate branch. The procedure was complicated by vessel perforation which resolved with prolonged balloon inflation and stenting. She felt well after this. Activity was progressed with both cardiac rehab and PT. HHPT was recommended and she was felt to be nearing discharge. However, the evening of 07/07/12 she went into rapid atrial fibrillation. She was treated with amiodarone bolus and drip and converted to NSR by the following morning. She was transitioned to  oral amiodarone. The duration of this medication (as well as periodic monitoring of other organ systems) will need to be determined by her primary cardiologist. Dr. Swaziland recommended 21-day  outpatient monitor to r/o recurrent episodes. In the meantime, the decision was made to not anticoagulate unless she has significant breakthrough on the event monitor. The hope is to avoid triple therapy with DAPT + anticoagulation given risk of bleeding. A PRU was checked this admission showing a level of 233, consistent with Plavix hyporesponsiveness. She was subsequently switched to Brilinta. She has remained in NSR today. She is feeling well. Dr. Swaziland has seen and examined her and feels she is stable for discharge.  Other issues this admission include adjustment of insulin given elevated blood sugars. She was instructed to f/u PCP for her diabetes management and peripheral neuropathy. She also completed a course of Cipro for a pansensitive pseudomonas UTI. With regard to her 80-99% known proximal RICA stenosis, Dr. Swaziland recommended that with recent cardiac event will need to determine optimal timing of intervention. He would favor waiting at least one month and optimally 3 months post PCI.  Discharge Vitals: Blood pressure 126/52, pulse 78, temperature 98.3 F (36.8 C), temperature source Oral, resp. rate 20, height 5\' 5"  (1.651 m), weight 184 lb 11.9 oz (83.8 kg), SpO2 97.00%.  Labs: Lab Results  Component Value Date   WBC 11.1* 07/09/2012   HGB 12.3 07/09/2012   HCT 37.6 07/09/2012   MCV 87.0 07/09/2012   PLT 259 07/09/2012     Recent Labs Lab 07/09/12 0600  NA 138  K 3.9  CL 104  CO2 27  BUN 20  CREATININE 0.96  CALCIUM 9.5  GLUCOSE 225*   No results found for this basename: CKTOTAL, CKMB, TROPONINI,  in the last 72 hours Lab Results  Component Value Date   CHOL 188 06/27/2012   HDL 32* 06/27/2012   LDLCALC 86 06/27/2012   TRIG 351* 06/27/2012     Diagnostic Studies/Procedures   Dg Chest Port 1 View 06/26/2012   *RADIOLOGY REPORT*  Clinical Data: Chest pain  PORTABLE CHEST - 1 VIEW  Comparison: Prior chest x-ray 05/27/2012  Findings: Stable appearance of the chest with  cardiomegaly and mild left atrial appendage enlargement.  The patient status post median sternotomy with evidence of prior multivessel CABG including LIMA bypass.  Atherosclerotic calcifications noted in the transverse aorta.  There may be trace bibasilar subsegmental atelectasis as seen on prior studies.  No pulmonary edema, pneumothorax, large effusion or focal airspace consolidation.  Advanced degenerative changes of bilateral glenohumeral joints consistent with osteoarthritis.  No acute osseous abnormality.  IMPRESSION:  No acute cardiopulmonary disease, stable chest x-ray.   Original Report Authenticated By: Malachy Moan, M.D.    Cardiac catheterization this admission, please see full report and above for summary.    Discharge Medications     Medication List    STOP taking these medications       cephALEXin 500 MG capsule  Commonly known as:  KEFLEX     clopidogrel 75 MG tablet  Commonly known as:  PLAVIX      TAKE these medications       amiodarone 200 MG tablet  Commonly known as:  PACERONE  Take 1 tablet (200 mg total) by mouth 2 (two) times daily.     amLODipine 5 MG tablet  Commonly known as:  NORVASC  Take 1 tablet (5 mg total) by mouth daily.     aspirin EC 81 MG  tablet  Take 81 mg by mouth every morning.     carvedilol 25 MG tablet  Commonly known as:  COREG  Take 1 tablet (25 mg total) by mouth 2 (two) times daily with a meal.     diphenhydrAMINE 25 MG tablet  Commonly known as:  SOMINEX  Take 25 mg by mouth at bedtime as needed for sleep.     fenofibrate 160 MG tablet  Take 160 mg by mouth daily.     fluticasone 50 MCG/ACT nasal spray  Commonly known as:  FLONASE  Place 1 spray into the nose daily as needed for allergies (for allergies). For allergies     folic acid 1 MG tablet  Commonly known as:  FOLVITE  Take 1 mg by mouth every morning.     furosemide 40 MG tablet  Commonly known as:  LASIX  Take 40 mg by mouth daily.      HYDROcodone-acetaminophen 5-325 MG per tablet  Commonly known as:  NORCO/VICODIN  Take 1 tablet by mouth daily as needed for pain.     insulin glargine 100 UNIT/ML injection  Commonly known as:  LANTUS  Inject 0.55 mLs (55 Units total) into the skin 2 (two) times daily.     insulin lispro 100 UNIT/ML injection  Commonly known as:  HUMALOG  Inject 15 Units into the skin 3 (three) times daily before meals. Sliding scale as you were doing before admission to the hospital.     isosorbide mononitrate 30 MG 24 hr tablet  Commonly known as:  IMDUR  Take 30 mg by mouth every morning.     levothyroxine 25 MCG tablet  Commonly known as:  SYNTHROID, LEVOTHROID  Take 25 mcg by mouth daily before breakfast.     meclizine 25 MG tablet  Commonly known as:  ANTIVERT  Take 25 mg by mouth 3 (three) times daily as needed for dizziness.     NITROSTAT 0.4 MG SL tablet  Generic drug:  nitroGLYCERIN  Place 0.4 mg under the tongue every 5 (five) minutes as needed for chest pain. May repeat times three.     omega-3 acid ethyl esters 1 G capsule  Commonly known as:  LOVAZA  Take 2 g by mouth 2 (two) times daily.     promethazine 25 MG tablet  Commonly known as:  PHENERGAN  Take 25 mg by mouth every 6 (six) hours as needed for nausea.     ramipril 2.5 MG capsule  Commonly known as:  ALTACE  Take 2.5 mg by mouth 2 (two) times daily.     Ticagrelor 90 MG Tabs tablet  Commonly known as:  BRILINTA  Take 1 tablet (90 mg total) by mouth 2 (two) times daily.        Disposition   The patient will be discharged in stable condition to home. Discharge Orders   Future Appointments Provider Department Dept Phone   07/14/2012 2:40 PM Prescott Parma, PA-C [facility] (near [place]) [phone]   08/05/2012 2:30 PM Luis Abed, MD [facility] (near [place]) [phone]   Future Orders Complete By Expires     Diet - low sodium heart healthy  As directed     Comments:       Diabetic Diet    Discharge instructions  As directed     Comments:      Call your primary care doctor upon discharge to discuss monitoring and adjustment of your diabetes medications. Your blood sugars were elevated while  in the hospital.   Patients on amiodarone may need intermittent check-ups of other organ systems, such as lungs, thyroid, eyes, and liver function. Please talk to your doctor at your follow-up appointments about what monitoring may be needed for you.  At your follow up appointment, please talk to your provider about the timing of your carotid surgery.    Increase activity slowly  As directed     Comments:      No driving for 1 week. No lifting over 10 lbs for 2 weeks. No sexual activity for 2 weeks. Keep procedure site clean & dry. If you notice increased pain, swelling, bleeding or pus, call/return!  You may shower, but no soaking baths/hot tubs/pools for 1 week.      Follow-up Information   Follow up with SERPE, EUGENE, PA-C. (Follow up appointment on 07/14/12 at 2:40pm. A heart monitor will be mailed to your house with instructions. Please call the office if you have any questions at all!)    Contact information:   [address], [address] [place] [address] [phone] [facility] office      Follow up with Milinda Antis, MD. (Call your primary care doctor to discuss your diabetes medication regimen. Your blood sugars were high in the hospital.)    Contact information:   [address] [place] [address] [phone]         Duration of Discharge Encounter: Greater than 30 minutes including physician and PA time.  Signed, Ronie Spies PA-C 07/09/2012, 3:42 PM

## 2012-07-12 NOTE — Telephone Encounter (Signed)
Patient contacted regarding discharge from [facility] on Friday July 09, 2012.  Patient does understand to follow up with Gene on Wednesday at 2:40 pm at [facility]. Patient does understand her discharge instructions. Patient does understand all of her medications and the regimen. Patient does understand to bring all medications to this visit.  Patient denies having chest pain, dizziness or sob.

## 2012-07-13 ENCOUNTER — Telehealth: Payer: Self-pay | Admitting: Family Medicine

## 2012-07-13 NOTE — Telephone Encounter (Signed)
I called and spoke to [facility] about pt appt and they said they will be contacting pt sometime next week.

## 2012-07-14 ENCOUNTER — Encounter: Payer: Self-pay | Admitting: Physician Assistant

## 2012-07-14 ENCOUNTER — Ambulatory Visit (INDEPENDENT_AMBULATORY_CARE_PROVIDER_SITE_OTHER): Payer: Medicare Other | Admitting: Physician Assistant

## 2012-07-14 VITALS — BP 107/62 | HR 67 | Ht 65.0 in | Wt 187.0 lb

## 2012-07-14 DIAGNOSIS — E785 Hyperlipidemia, unspecified: Secondary | ICD-10-CM

## 2012-07-14 DIAGNOSIS — I779 Disorder of arteries and arterioles, unspecified: Secondary | ICD-10-CM

## 2012-07-14 DIAGNOSIS — I2589 Other forms of chronic ischemic heart disease: Secondary | ICD-10-CM

## 2012-07-14 DIAGNOSIS — I4891 Unspecified atrial fibrillation: Secondary | ICD-10-CM

## 2012-07-14 DIAGNOSIS — I255 Ischemic cardiomyopathy: Secondary | ICD-10-CM

## 2012-07-14 DIAGNOSIS — I2510 Atherosclerotic heart disease of native coronary artery without angina pectoris: Secondary | ICD-10-CM

## 2012-07-14 DIAGNOSIS — I10 Essential (primary) hypertension: Secondary | ICD-10-CM

## 2012-07-14 NOTE — Assessment & Plan Note (Signed)
History of statin intolerance. Continue current treatment regimen with fenofibrate and fish oil.

## 2012-07-14 NOTE — Assessment & Plan Note (Signed)
Stable, status post recent small NSTEMI, requiring PCI with DES previously documented high-grade RI stenosis, complicated by vessel perforation. She has not had any recurrent CP. She does, however, complain of weakness, which I attribute to current relative hypotension. Will discontinue Norvasc, and continue current heart failure medication regimen which includes full dose carvedilol, Lasix, and ACE inhibitor. Patient to remain on DAPT for at least 1 full year.

## 2012-07-14 NOTE — Progress Notes (Signed)
Primary Cardiologist: Jerral Bonito, MD   HPI: Post hospital followup from [facility], discharged June 20, following presentation with NSTEMI. Peak troponin 0.54.  [facility], and was treated with IV heparin and IV NTG. Of note, she was initially placed on Ranexa, but subsequently refused this medication, citing financial constraints. Secondary to refractory CP, recommendation was to proceed with cardiac catheterization, after review of most recent films. She required contrast dye prophylaxis and underwent coronary angiography on June 16.  She had a known complex 95% mid ramus intermediate branch stenosis, for which prior PCI attempt on May 27 was unsuccessful due to marked tortuosity. This time Dr. Swaziland was able to successfully place a drug-eluting stent to the ramus intermedius lesion; however, procedure was complicated by vessel perforation requiring prolonged balloon inflation and subsequent stenting.  Hospital course also complicated by development of PAF RVR, requiring treatment with IV amiodarone. Patient successfully converted to NSR the following morning. So as to avoid triple therapy with DAPT/anticoagulation, recommendation was to further evaluate as an outpatient with a 21 day event monitor, and to reserve anticoagulation for significant breakthrough atrial fibrillation. She was discharged on amiodarone 200 twice a day.  Patient also found to be hyporesponsive to Plavix, and was switched to Brilinta.  She presents today with no recurrent CP. She denies tachycardia palpitations. She does complain of weakness.  Allergies  Allergen Reactions  . Atorvastatin Other (See Comments)    Unknown-patient admitted to hospital after taking  . Citrus Dermatitis    BREAK OUT ON BODY  . Contrast Media (Iodinated Diagnostic Agents)   . Iohexol      Desc: ANAPHYLAXIS   . Plavix (Clopidogrel Bisulfate)     Not an allergy - but PRU was drawn 06/2012 indicating Plavix  hyporesponsiveness, thus she was changed to Brilinta.  . Zolpidem Tartrate     REACTION: hallucinations  . Milk-Related Compounds Rash    Current Outpatient Prescriptions  Medication Sig Dispense Refill  . amiodarone (PACERONE) 200 MG tablet Take 1 tablet (200 mg total) by mouth 2 (two) times daily.  60 tablet  1  . aspirin EC 81 MG tablet Take 81 mg by mouth every morning.      . carvedilol (COREG) 25 MG tablet Take 1 tablet (25 mg total) by mouth 2 (two) times daily with a meal.  60 tablet  3  . diphenhydrAMINE (SOMINEX) 25 MG tablet Take 25 mg by mouth at bedtime as needed for sleep.      . fenofibrate 160 MG tablet Take 160 mg by mouth daily.      . fluticasone (FLONASE) 50 MCG/ACT nasal spray Place 1 spray into the nose daily as needed for allergies (for allergies). For allergies      . folic acid (FOLVITE) 1 MG tablet Take 1 mg by mouth every morning.       . furosemide (LASIX) 40 MG tablet Take 40 mg by mouth daily.      [name] HYDROcodone-acetaminophen (NORCO/VICODIN) 5-325 MG per tablet Take 1 tablet by mouth daily as needed for pain.       [name] insulin glargine (LANTUS) 100 UNIT/ML injection Inject 45-55 Units into the skin 2 (two) times daily. 45 units in AM and 55 units in PM      . insulin lispro (HUMALOG) 100 UNIT/ML injection Inject 15 Units into the skin 3 (three) times daily before meals. Sliding scale as you were doing before admission to the hospital.      .  isosorbide mononitrate (IMDUR) 30 MG 24 hr tablet Take 30 mg by mouth every morning.       [name] levothyroxine (SYNTHROID, LEVOTHROID) 25 MCG tablet Take 25 mcg by mouth daily before breakfast.      . meclizine (ANTIVERT) 25 MG tablet Take 25 mg by mouth 3 (three) times daily as needed for dizziness.      . nitroGLYCERIN (NITROSTAT) 0.4 MG SL tablet Place 0.4 mg under the tongue every 5 (five) minutes as needed for chest pain. May repeat times three.      . omega-3 acid ethyl esters (LOVAZA) 1 G capsule Take 2 g by mouth 2 (two)  times daily.      . promethazine (PHENERGAN) 25 MG tablet Take 25 mg by mouth every 6 (six) hours as needed for nausea.      . ramipril (ALTACE) 2.5 MG capsule Take 2.5 mg by mouth 2 (two) times daily.      . Ticagrelor (BRILINTA) 90 MG TABS tablet Take 1 tablet (90 mg total) by mouth 2 (two) times daily.  60 tablet  3   No current facility-administered medications for this visit.    Past Medical History  Diagnosis Date  . Hypertension     Unspecified  . Hyperlipidemia     Mixed, statin intolerance.  [name] CAD (coronary artery disease) 08/21/09    a. CAD s/p CABG 2003. b. Cath 2006: occluded OM vein graft. c. Anterior MI 04/15/12 s/p PTCA only to ramus (stenting unsuccessful at that time) d. NSTEMI 06/2012: failed med rx, s/p DES to ramus c/b vessel perforation tx with balloon/stenting. Plavix hyporesponsive so changed to Brilinta.  . S/P CABG (coronary artery bypass graft) 2003     2003  LIMA, LAD, SVG to the OM, SVG right coronary artery  . Thoracic ascending aortic aneurysm 2003    Ascending thoracic aneurysm repair with a graft at time of CABG  . Diabetes mellitus     Insulin dependent  . Gastroparesis   . Depression   . Allergy history, radiographic dye     Contrast dye allergy  . Ejection fraction     EF 50%  //  Ejection fraction 25-30% at the time of stress echo,  May, 2013  . Previous back surgery   . Syncope 2008    Question?  2008  . Systolic CHF   . Ischemic cardiomyopathy 06/2011    a. Echo 04/19/12: EF 25-30%, mod LVH, diffuse HK, trivial AI  . Hypotension     October, 2013  . Hypothyroidism     Treated with low-dose Synthroid  . CKD (chronic kidney disease)   . Carotid artery disease     a. 80-99% prox RICA stenosis - with recent cardiac event 06/2012, favor waiting at least 1 month, optimally 3 months post PCI.  [name] Hypertriglyceridemia   . Statin intolerance   . Peripheral neuropathy   . Atrial fibrillation     a. Noted during 06/2012 hospitalization. Placed on  amiodarone. Plan is for event monitor to assess for breakthrough, hold off anticoag unless additional AF seen.  [name] NSVT (nonsustained ventricular tachycardia)     a. Noted during 06/2012 hospitalization.  . Hypotension     Past Surgical History  Procedure Laterality Date  . Coronary artery bypass graft  2003     LIMA, LAD, SVG to the OM, SVG right coronary artery  . Bladder tacking    . Salivary gland surgery    . Total abdominal hysterectomy    .  Back surgery    . Breast surgery    . External ear surgery    . Cardiac catheterization  04/15/12    60-70% proximal LAD, 95% mid AV groove circumflex s/p PTCA, proximal RCA occlusion, patent LIMA to LAD, patent SVG to distal RCA, chronically occluded SVG to circumflex; LVEF 25-30%    History   Social History  . Marital Status: Divorced    Spouse Name: N/A    Number of Children: N/A  . Years of Education: N/A   Occupational History  . Retired    Social History Main Topics  . Smoking status: Never Smoker   . Smokeless tobacco: Never Used  . Alcohol Use: 1.5 oz/week    3 drink(s) per week  . Drug Use: No  . Sexually Active: Not Currently   Other Topics Concern  . Not on file   Social History Narrative  . No narrative on file    Family History  Problem Relation Age of Onset  . Heart disease Other   . Heart disease Mother   . Hypertension Mother   . Heart disease Father   . Hypertension Father     ROS: no nausea, vomiting; no fever, chills; no melena, hematochezia; no claudication  PHYSICAL EXAM: BP 107/62  Pulse 67  Ht 5\' 5"  (1.651 m)  Wt 187 lb (84.823 kg)  BMI 31.12 kg/m2  SpO2 96% GENERAL: 79 year old female, obese; NAD HEENT: NCAT, PERRLA, EOMI; sclera clear; no xanthelasma NECK: palpable bilateral carotid pulses, no bruits; no JVD; no TM LUNGS: CTA bilaterally CARDIAC: RRR (S1, S2); no significant murmurs; no rubs or gallops ABDOMEN: soft, non-tender; intact BS EXTREMETIES: Soft R groin with palpable  pulsenelson,Cristyn , and associated bruit; small left groin hematoma, palpable pulse without bruit; no significant peripheral edema SKIN: warm/dry; no obvious rash/lesions MUSCULOSKELETAL: no joint deformity NEURO: no focal deficit; NL affect   EKG:    ASSESSMENT & PLAN:  Cardiomyopathy, ischemic Stable, status post recent small NSTEMI, requiring PCI with DES previously documented high-grade RI stenosis, complicated by vessel perforation. She has not had any recurrent CP. She does, however, complain of weakness, which I attribute to current relative hypotension. Will discontinue Norvasc, and continue current heart failure medication regimen which includes full dose carvedilol, Lasix, and ACE inhibitor. Patient to remain on DAPT for at least 1 full year.  Atrial fibrillation Newly documented, requiring treatment with amiodarone with subsequent successful conversion to NSR. Continue current dose amiodarone 200 twice a day and reassess at followup OV in one month. Proceed with recommendation for 21 day event monitor. Recommendation is to avoid anticoagulation, given that patient is on DAPT, unless there is evidence of significant AF breakthrough.  Hypertension Will discontinue Norvasc  Carotid artery disease Followed by Dr. Tawanna Cooler Early. Recommendation is to defer PCI of known high-grade RICA stenosis for at least 1 month, preferably 3, post recent PCI.  Hyperlipidemia History of statin intolerance. Continue current treatment regimen with fenofibrate and fish oil.    Gene Muadh Creasy, PAC

## 2012-07-14 NOTE — Patient Instructions (Addendum)
Stop: Norvasc  Your physician has recommended that you wear a 21-day event monitor. Event monitors are medical devices that record the heart's electrical activity. Doctors most often [place] these monitors to diagnose arrhythmias. Arrhythmias are problems with the speed or rhythm of the heartbeat. The monitor is a small, portable device. You can wear one while you do your normal daily activities. This is usually used to diagnose what is causing palpitations/syncope (passing out).  Your physician has requested that you have a lower extremity arterial duplex of your right sided groin to r/o psa. This test is an ultrasound of the arteries in the legs or arms. It looks at arterial blood flow in the legs and arms. Allow one hour for Lower and Upper Arterial scans. There are no restrictions or special instructions  LABS:  CBC & CMET  Your physician recommends that you schedule a follow-up appointment in: 1 month with Dr. Myrtis Ser.

## 2012-07-14 NOTE — Assessment & Plan Note (Signed)
Will discontinue Norvasc.

## 2012-07-14 NOTE — Assessment & Plan Note (Signed)
Followed by Dr. Gretta Began. Recommendation is to defer PCI of known high-grade RICA stenosis for at least 1 month, preferably 3, post recent PCI.

## 2012-07-14 NOTE — Assessment & Plan Note (Signed)
Newly documented, requiring treatment with amiodarone with subsequent successful conversion to NSR. Continue current dose amiodarone 200 twice a day and reassess at followup OV in one month. Proceed with recommendation for 21 day event monitor. Recommendation is to avoid anticoagulation, given that patient is on DAPT, unless there is evidence of significant AF breakthrough.

## 2012-07-15 ENCOUNTER — Telehealth: Payer: Self-pay | Admitting: Cardiology

## 2012-07-15 DIAGNOSIS — I4891 Unspecified atrial fibrillation: Secondary | ICD-10-CM

## 2012-07-15 NOTE — Telephone Encounter (Signed)
Pt informed of results.

## 2012-07-15 NOTE — Telephone Encounter (Signed)
Message copied by Burnice Logan on Thu Jul 15, 2012 10:18 AM ------      Message from: Rande Brunt      Created: Wed Jul 14, 2012  5:00 PM       NL Hgb ------

## 2012-07-15 NOTE — Telephone Encounter (Signed)
Message copied by Burnice Logan on Thu Jul 15, 2012 10:19 AM ------      Message from: Rande Brunt      Created: Thu Jul 15, 2012 10:12 AM       Labs ok ------

## 2012-07-16 ENCOUNTER — Other Ambulatory Visit: Payer: Self-pay | Admitting: Family Medicine

## 2012-07-16 NOTE — Telephone Encounter (Signed)
Ok to refill 

## 2012-07-16 NOTE — Telephone Encounter (Signed)
Yes refill with 3

## 2012-07-16 NOTE — Telephone Encounter (Signed)
Med called in

## 2012-07-20 ENCOUNTER — Telehealth: Payer: Self-pay | Admitting: Family Medicine

## 2012-07-20 DIAGNOSIS — E2749 Other adrenocortical insufficiency: Secondary | ICD-10-CM

## 2012-07-20 DIAGNOSIS — IMO0002 Reserved for concepts with insufficient information to code with codable children: Secondary | ICD-10-CM

## 2012-07-20 DIAGNOSIS — E1165 Type 2 diabetes mellitus with hyperglycemia: Secondary | ICD-10-CM

## 2012-07-20 NOTE — Telephone Encounter (Signed)
Noted, Dr. Fransico Him now open in [place] will send there

## 2012-07-21 ENCOUNTER — Encounter (INDEPENDENT_AMBULATORY_CARE_PROVIDER_SITE_OTHER): Payer: Medicare Other

## 2012-07-21 DIAGNOSIS — I4891 Unspecified atrial fibrillation: Secondary | ICD-10-CM

## 2012-07-21 DIAGNOSIS — R0989 Other specified symptoms and signs involving the circulatory and respiratory systems: Secondary | ICD-10-CM

## 2012-07-21 DIAGNOSIS — M79609 Pain in unspecified limb: Secondary | ICD-10-CM

## 2012-07-21 DIAGNOSIS — I2510 Atherosclerotic heart disease of native coronary artery without angina pectoris: Secondary | ICD-10-CM

## 2012-07-22 ENCOUNTER — Encounter: Payer: Self-pay | Admitting: *Deleted

## 2012-07-26 ENCOUNTER — Other Ambulatory Visit: Payer: Self-pay | Admitting: Family Medicine

## 2012-07-28 ENCOUNTER — Telehealth: Payer: Self-pay | Admitting: Physician Assistant

## 2012-07-28 NOTE — Telephone Encounter (Addendum)
Spoke with Cathy [name] and she stated patient c/o increased sob along with 4 pound weight gain. No c/o dizziness or chest pain. Patient is currently taking lasix 40 mg daily. Please advise.

## 2012-07-28 NOTE — Telephone Encounter (Signed)
Recommend RN visit for vitals/EKG

## 2012-07-28 NOTE — Telephone Encounter (Signed)
Patient informed and states she can't come this week for an appointment and will keep her already scheduled appointment with Dr. Myrtis Ser next week.

## 2012-07-28 NOTE — Telephone Encounter (Deleted)
Increased sob

## 2012-07-28 NOTE — Telephone Encounter (Signed)
C[name] with [facility] is at her home today. She has gained 4 lbs [name]/o shortness of breath. Last weight on 07-23-2012 186 and 190.6 today .  Please advise

## 2012-08-04 ENCOUNTER — Encounter: Payer: Self-pay | Admitting: Cardiology

## 2012-08-04 DIAGNOSIS — I4891 Unspecified atrial fibrillation: Secondary | ICD-10-CM | POA: Insufficient documentation

## 2012-08-04 DIAGNOSIS — I959 Hypotension, unspecified: Secondary | ICD-10-CM | POA: Insufficient documentation

## 2012-08-04 DIAGNOSIS — Z9229 Personal history of other drug therapy: Secondary | ICD-10-CM | POA: Insufficient documentation

## 2012-08-04 DIAGNOSIS — I472 Ventricular tachycardia: Secondary | ICD-10-CM | POA: Insufficient documentation

## 2012-08-05 ENCOUNTER — Ambulatory Visit: Payer: Medicare Other | Admitting: Cardiology

## 2012-08-06 ENCOUNTER — Telehealth: Payer: Self-pay | Admitting: *Deleted

## 2012-08-06 NOTE — Telephone Encounter (Signed)
Message left on nurse's voicemail if PT could be extended. Nurse returned call of [facility] and left message on his vm giving verbal ok to extend PT.

## 2012-08-09 ENCOUNTER — Other Ambulatory Visit: Payer: Self-pay | Admitting: Family Medicine

## 2012-08-11 ENCOUNTER — Telehealth: Payer: Self-pay | Admitting: Cardiology

## 2012-08-11 NOTE — Telephone Encounter (Signed)
New Prob      Following up on some paperwork that was faxed over yesterday. Please call.

## 2012-08-11 NOTE — Telephone Encounter (Signed)
Please watch for [facility] orders, resent today. Please call her if you will not sign.  Tiffany RN aware Dr/ nurse out of office today.

## 2012-08-24 ENCOUNTER — Telehealth: Payer: Self-pay | Admitting: Family Medicine

## 2012-08-24 MED ORDER — INSULIN LISPRO 100 UNIT/ML ~~LOC~~ SOLN: 15.0000 [IU] | Freq: Three times a day (TID) | SUBCUTANEOUS | Status: DC

## 2012-08-24 NOTE — Telephone Encounter (Signed)
Medication refilled per protocol. 

## 2012-08-27 ENCOUNTER — Ambulatory Visit: Payer: Medicare Other | Admitting: Physician Assistant

## 2012-08-30 ENCOUNTER — Other Ambulatory Visit: Payer: Self-pay | Admitting: Family Medicine

## 2012-09-02 ENCOUNTER — Other Ambulatory Visit: Payer: Self-pay

## 2012-09-02 MED ORDER — AMIODARONE HCL 200 MG PO TABS: 200.0000 mg | ORAL_TABLET | Freq: Two times a day (BID) | ORAL | Status: DC

## 2012-09-07 ENCOUNTER — Encounter (HOSPITAL_COMMUNITY): Payer: Self-pay | Admitting: *Deleted

## 2012-09-08 ENCOUNTER — Other Ambulatory Visit: Payer: Self-pay

## 2012-09-08 MED ORDER — ISOSORBIDE MONONITRATE ER 30 MG PO TB24: 30.0000 mg | ORAL_TABLET | Freq: Every morning | ORAL | Status: DC

## 2012-09-13 ENCOUNTER — Telehealth: Payer: Self-pay | Admitting: *Deleted

## 2012-09-13 ENCOUNTER — Encounter: Payer: Self-pay | Admitting: *Deleted

## 2012-09-13 DIAGNOSIS — E785 Hyperlipidemia, unspecified: Secondary | ICD-10-CM

## 2012-09-13 DIAGNOSIS — I2510 Atherosclerotic heart disease of native coronary artery without angina pectoris: Secondary | ICD-10-CM

## 2012-09-13 DIAGNOSIS — Z79899 Other long term (current) drug therapy: Secondary | ICD-10-CM

## 2012-09-13 NOTE — Telephone Encounter (Signed)
Message copied by Lesle Chris on Mon Sep 13, 2012 10:23 AM ------      Message from: Lesle Chris      Created: Wed Jun 09, 2012  2:02 PM       FLP, LFT ------

## 2012-09-13 NOTE — Telephone Encounter (Signed)
Letter & orders mailed today.  

## 2012-10-03 ENCOUNTER — Emergency Department (HOSPITAL_COMMUNITY)
Admission: EM | Admit: 2012-10-03 | Discharge: 2012-10-03 | Disposition: A | Discharge status: Home / Self Care | Payer: Medicare Other | Attending: Emergency Medicine | Admitting: Emergency Medicine

## 2012-10-03 ENCOUNTER — Emergency Department (HOSPITAL_COMMUNITY): Payer: Medicare Other

## 2012-10-03 ENCOUNTER — Encounter (HOSPITAL_COMMUNITY): Payer: Self-pay

## 2012-10-03 DIAGNOSIS — N189 Chronic kidney disease, unspecified: Secondary | ICD-10-CM | POA: Insufficient documentation

## 2012-10-03 DIAGNOSIS — R110 Nausea: Secondary | ICD-10-CM | POA: Insufficient documentation

## 2012-10-03 DIAGNOSIS — E119 Type 2 diabetes mellitus without complications: Secondary | ICD-10-CM | POA: Insufficient documentation

## 2012-10-03 DIAGNOSIS — Z79899 Other long term (current) drug therapy: Secondary | ICD-10-CM | POA: Insufficient documentation

## 2012-10-03 DIAGNOSIS — E039 Hypothyroidism, unspecified: Secondary | ICD-10-CM | POA: Insufficient documentation

## 2012-10-03 DIAGNOSIS — F329 Major depressive disorder, single episode, unspecified: Secondary | ICD-10-CM | POA: Insufficient documentation

## 2012-10-03 DIAGNOSIS — I4891 Unspecified atrial fibrillation: Secondary | ICD-10-CM | POA: Insufficient documentation

## 2012-10-03 DIAGNOSIS — I129 Hypertensive chronic kidney disease with stage 1 through stage 4 chronic kidney disease, or unspecified chronic kidney disease: Secondary | ICD-10-CM | POA: Insufficient documentation

## 2012-10-03 DIAGNOSIS — I2510 Atherosclerotic heart disease of native coronary artery without angina pectoris: Secondary | ICD-10-CM | POA: Insufficient documentation

## 2012-10-03 DIAGNOSIS — Z8719 Personal history of other diseases of the digestive system: Secondary | ICD-10-CM | POA: Insufficient documentation

## 2012-10-03 DIAGNOSIS — F3289 Other specified depressive episodes: Secondary | ICD-10-CM | POA: Insufficient documentation

## 2012-10-03 DIAGNOSIS — I5020 Unspecified systolic (congestive) heart failure: Secondary | ICD-10-CM | POA: Insufficient documentation

## 2012-10-03 DIAGNOSIS — Z951 Presence of aortocoronary bypass graft: Secondary | ICD-10-CM | POA: Insufficient documentation

## 2012-10-03 DIAGNOSIS — IMO0002 Reserved for concepts with insufficient information to code with codable children: Secondary | ICD-10-CM | POA: Insufficient documentation

## 2012-10-03 DIAGNOSIS — Z8669 Personal history of other diseases of the nervous system and sense organs: Secondary | ICD-10-CM | POA: Insufficient documentation

## 2012-10-03 DIAGNOSIS — Z794 Long term (current) use of insulin: Secondary | ICD-10-CM | POA: Insufficient documentation

## 2012-10-03 DIAGNOSIS — H9202 Otalgia, left ear: Secondary | ICD-10-CM

## 2012-10-03 DIAGNOSIS — H9209 Otalgia, unspecified ear: Secondary | ICD-10-CM | POA: Insufficient documentation

## 2012-10-03 MED ORDER — TRAMADOL HCL 50 MG PO TABS
50.0000 mg | ORAL_TABLET | Freq: Once | ORAL | Status: AC
  Administered 2012-10-03: 50 mg via ORAL
  Filled 2012-10-03: qty 1

## 2012-10-03 MED ORDER — TRAMADOL HCL 50 MG PO TABS: ORAL_TABLET | ORAL | Status: DC

## 2012-10-03 NOTE — ED Provider Notes (Signed)
CSN: [id number]     Arrival date & time 10/03/12  1612 History   First MD Initiated Contact with Patient 10/03/12 1624     Chief Complaint  Patient presents with  . Otalgia   (Consider location/radiation/quality/duration/timing/severity/associated sxs/prior Treatment) HPI Patient reports that since March she's been having pain and swelling just anterior to her left ear and posterior to her left ear. She states last night it seemed to get more painful. She states she's had sharp jabs at times but last night it  got more persistent. She had some nausea and states she did have a nausea pill that she took it got better however it started to wear off. She has not had vomiting. She does not relate the pain to eating. She denies any fever. She reports she's had 2 surgeries in her left ear and has no hearing in that ear. She states she was seen recently and told it was a lymph node problem. She reports her ENT is Dr. Lazarus Salines.   PCP Dr Jeanice Lim  Past Medical History  Diagnosis Date  . Hypertension     Unspecified  . Hyperlipidemia     Mixed, statin intolerance.  [name] CAD (coronary artery disease) 08/21/09    a. CAD s/p CABG 2003. b. Cath 2006: occluded OM vein graft. c. Anterior MI 04/15/12 s/p PTCA only to ramus (stenting unsuccessful at that time) d. NSTEMI 06/2012: failed med rx, s/p DES to ramus c/b vessel perforation tx with balloon/stenting. Plavix hyporesponsive so changed to Brilinta.  . S/P CABG (coronary artery bypass graft) 2003     2003  LIMA, LAD, SVG to the OM, SVG right coronary artery  . Thoracic ascending aortic aneurysm 2003    Ascending thoracic aneurysm repair with a graft at time of CABG  . Diabetes mellitus     Insulin dependent  . Gastroparesis   . Depression   . Allergy history, radiographic dye     Contrast dye allergy  . Ejection fraction     EF 50%  //  Ejection fraction 25-30% at the time of stress echo,  May, 2013  . Previous back surgery   . Syncope 2008    Question?   2008  . Systolic CHF   . Ischemic cardiomyopathy 06/2011    a. Echo 04/19/12: EF 25-30%, mod LVH, diffuse HK, trivial AI  . Hypotension     October, 2013  . Hypothyroidism     Treated with low-dose Synthroid  . CKD (chronic kidney disease)   . Carotid artery disease     a. 80-99% prox RICA stenosis - with recent cardiac event 06/2012, favor waiting at least 1 month, optimally 3 months post PCI.  [name] Hypertriglyceridemia   . Statin intolerance   . Peripheral neuropathy   . Atrial fibrillation     a. Noted during 06/2012 hospitalization. Placed on amiodarone. Plan is for event monitor to assess for breakthrough, hold off anticoag unless additional AF seen.  [name] NSVT (nonsustained ventricular tachycardia)     a. Noted during 06/2012 hospitalization.  . Hypotension    Past Surgical History  Procedure Laterality Date  . Coronary artery bypass graft  05/05/2001     LIMA, LAD, SVG to the OM, SVG right coronary artery  . Bladder tacking    . Salivary gland surgery    . Total abdominal hysterectomy    . Back surgery    . Breast surgery    . External ear surgery    . Cardiac catheterization  04/15/12, 07/05/12    60-70% proximal LAD, 95% mid AV groove circumflex s/p PTCA, proximal RCA occlusion, patent LIMA to LAD, patent SVG to distal RCA, chronically occluded SVG to circumflex; LVEF 25-30%   Family History  Problem Relation Age of Onset  . Heart disease Other   . Heart disease Mother   . Hypertension Mother   . Heart disease Father   . Hypertension Father    History  Substance Use Topics  . Smoking status: Never Smoker   . Smokeless tobacco: Never Used  . Alcohol Use: 1.5 oz/week    3 drink(s) per week  lives at home Lives alone   OB History   Grav Para Term Preterm Abortions TAB SAB Ect Mult Living                 Review of Systems  All other systems reviewed and are negative.    Allergies  Atorvastatin; Citrus; Contrast media; Iohexol; Plavix; Zolpidem tartrate; and  Milk-related compounds  Home Medications   Current Outpatient Rx  Name  Route  Sig  Dispense  Refill  . amLODipine (NORVASC) 5 MG tablet   Oral   Take 5 mg by mouth daily.         [name] aspirin EC 81 MG tablet   Oral   Take 81 mg by mouth every morning.         . carvedilol (COREG) 25 MG tablet   Oral   Take 1 tablet (25 mg total) by mouth 2 (two) times daily with a meal.   60 tablet   3   . cetirizine (ZYRTEC) 10 MG tablet   Oral   Take 10 mg by mouth daily.         . diphenhydrAMINE (SOMINEX) 25 MG tablet   Oral   Take 25 mg by mouth at bedtime as needed for sleep.         . fenofibrate 160 MG tablet   Oral   Take 160 mg by mouth daily.         . fluticasone (FLONASE) 50 MCG/ACT nasal spray   Nasal   Place 1 spray into the nose daily as needed for allergies.         . folic acid (FOLVITE) 1 MG tablet   Oral   Take 1 mg by mouth every morning.          . furosemide (LASIX) 40 MG tablet   Oral   Take 40 mg by mouth daily.         [name] HYDROcodone-acetaminophen (NORCO/VICODIN) 5-325 MG per tablet   Oral   Take 1 tablet by mouth at bedtime as needed for pain.         [name] insulin glargine (LANTUS) 100 UNIT/ML injection   Subcutaneous   Inject 45-55 Units into the skin 2 (two) times daily. 45 units in AM and 55 units in PM         . insulin lispro (HUMALOG) 100 UNIT/ML injection   Subcutaneous   Inject 15 Units into the skin 3 (three) times daily before meals. Sliding scale as you were doing before admission to the hospital.   10 mL   1   . isosorbide mononitrate (IMDUR) 30 MG 24 hr tablet   Oral   Take 1 tablet (30 mg total) by mouth every morning.   30 tablet   3   . levothyroxine (SYNTHROID, LEVOTHROID) 25 MCG tablet   Oral   Take 25  mcg by mouth daily before breakfast.         . meclizine (ANTIVERT) 25 MG tablet   Oral   Take 25 mg by mouth 3 (three) times daily as needed for dizziness.         [name] omega-3 acid ethyl esters (LOVAZA)  1 G capsule   Oral   Take 2 g by mouth 2 (two) times daily.         . ramipril (ALTACE) 2.5 MG capsule   Oral   Take 2.5 mg by mouth 2 (two) times daily.         [name] amiodarone (PACERONE) 200 MG tablet   Oral   Take 1 tablet (200 mg total) by mouth 2 (two) times daily.   60 tablet   3   . nitroGLYCERIN (NITROSTAT) 0.4 MG SL tablet   Sublingual   Place 0.4 mg under the tongue every 5 (five) minutes as needed for chest pain. May repeat times three.         . promethazine (PHENERGAN) 25 MG tablet   Oral   Take 25 mg by mouth every 6 (six) hours as needed for nausea.         . Ticagrelor (BRILINTA) 90 MG TABS tablet   Oral   Take 1 tablet (90 mg total) by mouth 2 (two) times daily.   60 tablet   3   . traMADol (ULTRAM) 50 MG tablet      Take 1 or 2 po Q 6hrs for pain   20 tablet   0    BP 133/59  Pulse 65  Temp(Src) 98.5 F (36.9 C) (Oral)  Resp 24  Ht 5\' 5"  (1.651 m)  Wt 186 lb (84.369 kg)  BMI 30.95 kg/m2  SpO2 95%  Vital signs normal   Physical Exam  Nursing note and vitals reviewed. Constitutional: She is oriented to person, place, and time. She appears well-developed and well-nourished.  Non-toxic appearance. She does not appear ill. No distress.  HENT:  Head: Normocephalic and atraumatic.    Right Ear: External ear normal.  Left Ear: Tympanic membrane, external ear and ear canal normal.  Nose: Nose normal. No mucosal edema or rhinorrhea.  Mouth/Throat: Oropharynx is clear and moist and mucous membranes are normal. No dental abscesses or edematous.  Pt points over her left parotid and posterior to her left ear as to where she is having pain. There is no swelling, redness, lesions, crepitance noted. Areas of painful sites noted. Voice normal  Eyes: Conjunctivae and EOM are normal. Pupils are equal, round, and reactive to light.  Neck: Normal range of motion and full passive range of motion without pain. Neck supple.  Pulmonary/Chest: Effort normal  and breath sounds normal. No respiratory distress. She has no rhonchi. She exhibits no crepitus.  Abdominal: Normal appearance. She exhibits no distension.  Musculoskeletal: Normal range of motion. She exhibits no edema and no tenderness.  Moves all extremities well.   Lymphadenopathy:    She has no cervical adenopathy.  Neurological: She is alert and oriented to person, place, and time. She has normal strength. No cranial nerve deficit.  Skin: Skin is warm, dry and intact. No rash noted. No erythema. No pallor.  Psychiatric: She has a normal mood and affect. Her speech is normal and behavior is normal. Her mood appears not anxious.    ED Course  Procedures (including critical care time)  Medications  traMADol (ULTRAM) tablet 50 mg (50 mg Oral Given 10/03/12  1709)   Pt informed her CT did show the cause of her pain. She should see her ENT for further testing. Pt had MI in June, EKG was done.   Imaging Review Ct Soft Tissue Neck Wo Contrast  10/03/2012   CLINICAL DATA:  Left ear pain.  EXAM: CT NECK WITHOUT CONTRAST  TECHNIQUE: Multidetector CT imaging of the neck was performed following the standard protocol without intravenous contrast.  COMPARISON:  05/29/2012.  FINDINGS: Examination is limited without IV contrast. No neck mass or adenopathy. Scattered lymph nodes are noted. The parotid and submandibular glands appear symmetric and normal. The tongue base and floor of the mouth are unremarkable. The epiglottis and paraglottic fat planes are normal. The piriform sinuses and vallecular air spaces are unremarkable.  Multiple bilateral thyroid nodules, left greater than right, likely multinodular goiter with substernal extension.  The visualized portion of the brain is unremarkable. Skull base is unremarkable. There are surgical changes noted involving the left temporal bone with a mastoid bowl procedure and probable prosthetic inner year ossicle. No abnormal fluid collections. The right mastoid  air cells are normal. The paranasal sinuses are clear.  The lung apices are clear. There is tortuosity, ectasia and atherosclerotic change involving the aorta surgical changes from median sternotomy are noted.  Small calcified density noted just deep to the lamina of C4 is likely a dural calcification and small meningioma.  IMPRESSION: No neck mass, adenopathy or abscess is identified.  Stable scattered lymph nodes but no pathologic adenopathy.  Multi nodular thyroid goiter, stable.  Surgical changes involving the left mastoids but no complicating features or abnormal fluid collection   Electronically Signed   By: Loralie Champagne M.D.   On: 10/03/2012 18:02     Date: 10/03/2012  Rate: 62  Rhythm: normal sinus rhythm  QRS Axis: left  Intervals: normal  ST/T Wave abnormalities: normal  Conduction Disutrbances:left bundle branch block  Narrative Interpretation:   Old EKG Reviewed: unchanged from 06/26/2012    MDM   1. Otalgia of left ear    New Prescriptions   TRAMADOL (ULTRAM) 50 MG TABLET    Take 1 or 2 po Q 6hrs for pain    Plan discharge   Devoria Albe, MD, Franz Dell, MD 10/03/12 1901

## 2012-10-03 NOTE — ED Notes (Signed)
Complain of pain in left ear. States she has been having pain off and on since back in March.

## 2012-10-03 NOTE — ED Notes (Signed)
Pt Grandson called and will come to get pt in .

## 2012-10-03 NOTE — ED Notes (Signed)
nad noted prior to dc. Dc instructions reviewed with pt and explained. 1 script given. Pt voiced understanding.

## 2012-10-04 ENCOUNTER — Telehealth: Payer: Self-pay | Admitting: Family Medicine

## 2012-10-04 NOTE — Telephone Encounter (Signed)
Message copied by Salley Scarlet on Mon Oct 04, 2012 12:03 PM ------      Message from: Sherian Rein      Created: Mon Oct 04, 2012 11:16 AM      Regarding: RE: Pt needs OV- needs 30 minute slot       I called pt and she said that she does not feel like coming in to see you right now. She just got out of the hospital yesterday and she has an appt with Dr. Fransico Him on Monday at 3 and if she feels like it she will call back after that and schedule an appt with you.      ----- Message -----         From: Salley Scarlet, MD         Sent: 10/04/2012  11:09 AM           To: Sherian Rein      Subject: Pt needs OV- needs 30 minute slot                               ------

## 2012-10-04 NOTE — Telephone Encounter (Signed)
Noted, pt declines appt. Has not been seen since June 2014, multiple co-morbidities, uncontrolled DM, ER visits

## 2012-10-20 ENCOUNTER — Telehealth: Payer: Self-pay | Admitting: Family Medicine

## 2012-10-20 MED ORDER — PROMETHAZINE HCL 25 MG PO TABS: 25.0000 mg | ORAL_TABLET | Freq: Four times a day (QID) | ORAL | Status: DC | PRN

## 2012-10-20 NOTE — Telephone Encounter (Signed)
?   OK to Refill  

## 2012-10-20 NOTE — Telephone Encounter (Signed)
Promethazine HCL 25 mg tab 1 q4-6 hours prn nausea and vomiting #30 last rf 09/07/12

## 2012-10-21 ENCOUNTER — Telehealth: Payer: Self-pay | Admitting: Family Medicine

## 2012-10-21 ENCOUNTER — Ambulatory Visit: Payer: Medicare Other | Admitting: Cardiology

## 2012-10-21 NOTE — Telephone Encounter (Signed)
Furosemide 40 mg 1 QD #30

## 2012-10-22 MED ORDER — FUROSEMIDE 40 MG PO TABS: 40.0000 mg | ORAL_TABLET | Freq: Every day | ORAL | Status: DC

## 2012-10-22 NOTE — Telephone Encounter (Signed)
Yes refill-  3 refills

## 2012-10-22 NOTE — Telephone Encounter (Signed)
Med phoned in °

## 2012-10-22 NOTE — Telephone Encounter (Signed)
Ok to refill 

## 2012-10-26 ENCOUNTER — Encounter: Payer: Self-pay | Admitting: Cardiology

## 2012-10-28 ENCOUNTER — Ambulatory Visit: Payer: Medicare Other | Admitting: Cardiology

## 2012-10-30 ENCOUNTER — Encounter (HOSPITAL_COMMUNITY): Payer: Self-pay | Admitting: Emergency Medicine

## 2012-10-30 ENCOUNTER — Inpatient Hospital Stay (HOSPITAL_COMMUNITY)
Admission: EM | Admit: 2012-10-30 | Discharge: 2012-11-10 | DRG: 445 | Disposition: A | Discharge status: Home Health | Payer: Medicare Other | Attending: Internal Medicine | Admitting: Internal Medicine

## 2012-10-30 DIAGNOSIS — I4729 Other ventricular tachycardia: Secondary | ICD-10-CM | POA: Diagnosis present

## 2012-10-30 DIAGNOSIS — Z789 Other specified health status: Secondary | ICD-10-CM | POA: Diagnosis present

## 2012-10-30 DIAGNOSIS — E785 Hyperlipidemia, unspecified: Secondary | ICD-10-CM | POA: Diagnosis present

## 2012-10-30 DIAGNOSIS — E1149 Type 2 diabetes mellitus with other diabetic neurological complication: Secondary | ICD-10-CM | POA: Diagnosis present

## 2012-10-30 DIAGNOSIS — R5381 Other malaise: Secondary | ICD-10-CM | POA: Diagnosis present

## 2012-10-30 DIAGNOSIS — Z9229 Personal history of other drug therapy: Secondary | ICD-10-CM

## 2012-10-30 DIAGNOSIS — I472 Ventricular tachycardia, unspecified: Secondary | ICD-10-CM | POA: Diagnosis present

## 2012-10-30 DIAGNOSIS — A498 Other bacterial infections of unspecified site: Secondary | ICD-10-CM | POA: Diagnosis present

## 2012-10-30 DIAGNOSIS — F3289 Other specified depressive episodes: Secondary | ICD-10-CM | POA: Diagnosis present

## 2012-10-30 DIAGNOSIS — Z23 Encounter for immunization: Secondary | ICD-10-CM

## 2012-10-30 DIAGNOSIS — I252 Old myocardial infarction: Secondary | ICD-10-CM

## 2012-10-30 DIAGNOSIS — E039 Hypothyroidism, unspecified: Secondary | ICD-10-CM | POA: Diagnosis present

## 2012-10-30 DIAGNOSIS — I4891 Unspecified atrial fibrillation: Secondary | ICD-10-CM | POA: Diagnosis present

## 2012-10-30 DIAGNOSIS — K5900 Constipation, unspecified: Secondary | ICD-10-CM | POA: Diagnosis present

## 2012-10-30 DIAGNOSIS — R32 Unspecified urinary incontinence: Secondary | ICD-10-CM

## 2012-10-30 DIAGNOSIS — K8071 Calculus of gallbladder and bile duct without cholecystitis with obstruction: Principal | ICD-10-CM | POA: Diagnosis present

## 2012-10-30 DIAGNOSIS — Z951 Presence of aortocoronary bypass graft: Secondary | ICD-10-CM

## 2012-10-30 DIAGNOSIS — Z79899 Other long term (current) drug therapy: Secondary | ICD-10-CM

## 2012-10-30 DIAGNOSIS — I200 Unstable angina: Secondary | ICD-10-CM

## 2012-10-30 DIAGNOSIS — I509 Heart failure, unspecified: Secondary | ICD-10-CM | POA: Diagnosis present

## 2012-10-30 DIAGNOSIS — E782 Mixed hyperlipidemia: Secondary | ICD-10-CM | POA: Diagnosis present

## 2012-10-30 DIAGNOSIS — N390 Urinary tract infection, site not specified: Secondary | ICD-10-CM | POA: Diagnosis present

## 2012-10-30 DIAGNOSIS — I2589 Other forms of chronic ischemic heart disease: Secondary | ICD-10-CM | POA: Diagnosis present

## 2012-10-30 DIAGNOSIS — G609 Hereditary and idiopathic neuropathy, unspecified: Secondary | ICD-10-CM | POA: Diagnosis present

## 2012-10-30 DIAGNOSIS — R509 Fever, unspecified: Secondary | ICD-10-CM

## 2012-10-30 DIAGNOSIS — I5022 Chronic systolic (congestive) heart failure: Secondary | ICD-10-CM | POA: Diagnosis present

## 2012-10-30 DIAGNOSIS — N189 Chronic kidney disease, unspecified: Secondary | ICD-10-CM | POA: Diagnosis present

## 2012-10-30 DIAGNOSIS — I2510 Atherosclerotic heart disease of native coronary artery without angina pectoris: Secondary | ICD-10-CM | POA: Diagnosis present

## 2012-10-30 DIAGNOSIS — I6529 Occlusion and stenosis of unspecified carotid artery: Secondary | ICD-10-CM | POA: Diagnosis present

## 2012-10-30 DIAGNOSIS — I129 Hypertensive chronic kidney disease with stage 1 through stage 4 chronic kidney disease, or unspecified chronic kidney disease: Secondary | ICD-10-CM | POA: Diagnosis present

## 2012-10-30 DIAGNOSIS — K3184 Gastroparesis: Secondary | ICD-10-CM | POA: Diagnosis present

## 2012-10-30 DIAGNOSIS — Z794 Long term (current) use of insulin: Secondary | ICD-10-CM

## 2012-10-30 DIAGNOSIS — Z9861 Coronary angioplasty status: Secondary | ICD-10-CM

## 2012-10-30 DIAGNOSIS — K8020 Calculus of gallbladder without cholecystitis without obstruction: Secondary | ICD-10-CM

## 2012-10-30 DIAGNOSIS — N182 Chronic kidney disease, stage 2 (mild): Secondary | ICD-10-CM | POA: Diagnosis present

## 2012-10-30 DIAGNOSIS — I255 Ischemic cardiomyopathy: Secondary | ICD-10-CM | POA: Diagnosis present

## 2012-10-30 DIAGNOSIS — Z7982 Long term (current) use of aspirin: Secondary | ICD-10-CM

## 2012-10-30 DIAGNOSIS — R5383 Other fatigue: Secondary | ICD-10-CM

## 2012-10-30 DIAGNOSIS — K831 Obstruction of bile duct: Secondary | ICD-10-CM

## 2012-10-30 DIAGNOSIS — I10 Essential (primary) hypertension: Secondary | ICD-10-CM

## 2012-10-30 DIAGNOSIS — F329 Major depressive disorder, single episode, unspecified: Secondary | ICD-10-CM | POA: Diagnosis present

## 2012-10-30 DIAGNOSIS — Z91041 Radiographic dye allergy status: Secondary | ICD-10-CM

## 2012-10-30 HISTORY — DX: Chronic systolic (congestive) heart failure: I50.22

## 2012-10-30 MED ORDER — SODIUM CHLORIDE 0.9 % IV SOLN
Freq: Once | INTRAVENOUS | Status: AC
  Administered 2012-10-31: 01:00:00 via INTRAVENOUS

## 2012-10-30 NOTE — ED Provider Notes (Addendum)
CSN: [id number]     Arrival date & time 10/30/12  2336 History   First MD Initiated Contact with Patient 10/30/12 2350     Chief Complaint  Patient presents with  . Chest Pain   (Consider location/radiation/quality/duration/timing/severity/associated sxs/prior Treatment) HPI Comments: 79 y/o F with history of CAD and thoracic aortic aneurysm repair along at time of CABG in 2003, carotid artery disease, DM, ICM who presents to the ED with chest pain. Chest pain started prior to arrival, left sided and midsternal, radiating in between her shoulder blades. The pain is pressure like and similar to her MI. Pain is present at ED arrival, she took a full dose ASA prior to arrival. + nausea, diaphoresis, dib. No new cough or recent infections. Pt has been taking her meds as prescribed.  Patient is a 79 y.o. female presenting with chest pain. The history is provided by the patient.  Chest Pain Associated symptoms: fever, nausea and shortness of breath   Associated symptoms: no abdominal pain, no cough, no headache and not vomiting     Past Medical History  Diagnosis Date  . Hypertension     Unspecified  . Hyperlipidemia     Mixed, statin intolerance.  [name] CAD (coronary artery disease) 08/21/09    a. CAD s/p CABG 2003. b. Cath 2006: occluded OM vein graft. c. Anterior MI 04/15/12 s/p PTCA only to ramus (stenting unsuccessful at that time) d. NSTEMI 06/2012: failed med rx, s/p DES to ramus c/b vessel perforation tx with balloon/stenting. Plavix hyporesponsive so changed to Brilinta.  . S/P CABG (coronary artery bypass graft) 2003     2003  LIMA, LAD, SVG to the OM, SVG right coronary artery  . Thoracic ascending aortic aneurysm 2003    Ascending thoracic aneurysm repair with a graft at time of CABG  . Diabetes mellitus     Insulin dependent  . Gastroparesis   . Depression   . Allergy history, radiographic dye     Contrast dye allergy  . Ejection fraction     EF 50%  //  Ejection fraction 25-30% at  the time of stress echo,  May, 2013  . Previous back surgery   . Syncope 2008    Question?  2008  . Systolic CHF   . Ischemic cardiomyopathy 06/2011    a. Echo 04/19/12: EF 25-30%, mod LVH, diffuse HK, trivial AI  . Hypotension     October, 2013  . Hypothyroidism     Treated with low-dose Synthroid  . CKD (chronic kidney disease)   . Carotid artery disease     a. 80-99% prox RICA stenosis - with recent cardiac event 06/2012, favor waiting at least 1 month, optimally 3 months post PCI.  [name] Hypertriglyceridemia   . Statin intolerance   . Peripheral neuropathy   . Atrial fibrillation     a. Noted during 06/2012 hospitalization. Placed on amiodarone. Plan is for event monitor to assess for breakthrough, hold off anticoag unless additional AF seen.  [name] NSVT (nonsustained ventricular tachycardia)     a. Noted during 06/2012 hospitalization.  . Hypotension    Past Surgical History  Procedure Laterality Date  . Coronary artery bypass graft  05/05/2001     LIMA, LAD, SVG to the OM, SVG right coronary artery  . Bladder tacking    . Salivary gland surgery    . Total abdominal hysterectomy    . Back surgery    . Breast surgery    . External ear surgery    .  Cardiac catheterization  04/15/12, 07/05/12    60-70% proximal LAD, 95% mid AV groove circumflex s/p PTCA, proximal RCA occlusion, patent LIMA to LAD, patent SVG to distal RCA, chronically occluded SVG to circumflex; LVEF 25-30%   Family History  Problem Relation Age of Onset  . Heart disease Other   . Heart disease Mother   . Hypertension Mother   . Heart disease Father   . Hypertension Father    History  Substance Use Topics  . Smoking status: Never Smoker   . Smokeless tobacco: Never Used  . Alcohol Use: 1.5 oz/week    3 drink(s) per week   OB History   Grav Para Term Preterm Abortions TAB SAB Ect Mult Living                 Review of Systems  Constitutional: Positive for fever. Negative for activity change.  Respiratory:  Positive for shortness of breath. Negative for cough.   Cardiovascular: Positive for chest pain.  Gastrointestinal: Positive for nausea. Negative for vomiting and abdominal pain.  Genitourinary: Negative for dysuria.  Musculoskeletal: Negative for neck pain.  Neurological: Negative for headaches.    Allergies  Atorvastatin; Citrus; Contrast media; Iohexol; Plavix; Zolpidem tartrate; and Milk-related compounds  Home Medications   Current Outpatient Rx  Name  Route  Sig  Dispense  Refill  . amiodarone (PACERONE) 200 MG tablet   Oral   Take 1 tablet (200 mg total) by mouth 2 (two) times daily.   60 tablet   3   . amLODipine (NORVASC) 5 MG tablet   Oral   Take 5 mg by mouth daily.         [name] aspirin EC 81 MG tablet   Oral   Take 81 mg by mouth every morning.         . carvedilol (COREG) 25 MG tablet   Oral   Take 1 tablet (25 mg total) by mouth 2 (two) times daily with a meal.   60 tablet   3   . cetirizine (ZYRTEC) 10 MG tablet   Oral   Take 10 mg by mouth daily.         . diphenhydrAMINE (SOMINEX) 25 MG tablet   Oral   Take 25 mg by mouth at bedtime as needed for sleep.         . fenofibrate 160 MG tablet   Oral   Take 160 mg by mouth daily.         . fluticasone (FLONASE) 50 MCG/ACT nasal spray   Nasal   Place 1 spray into the nose daily as needed for allergies.         . folic acid (FOLVITE) 1 MG tablet   Oral   Take 1 mg by mouth every morning.          . furosemide (LASIX) 40 MG tablet   Oral   Take 1 tablet (40 mg total) by mouth daily.   30 tablet   3   . HYDROcodone-acetaminophen (NORCO/VICODIN) 5-325 MG per tablet   Oral   Take 1 tablet by mouth at bedtime as needed for pain.         [name] insulin glargine (LANTUS) 100 UNIT/ML injection   Subcutaneous   Inject 45-55 Units into the skin 2 (two) times daily. 45 units in AM and 55 units in PM         . insulin lispro (HUMALOG) 100 UNIT/ML injection   Subcutaneous   Inject  15 Units  into the skin 3 (three) times daily before meals. Sliding scale as you were doing before admission to the hospital.   10 mL   1   . isosorbide mononitrate (IMDUR) 30 MG 24 hr tablet   Oral   Take 1 tablet (30 mg total) by mouth every morning.   30 tablet   3   . levothyroxine (SYNTHROID, LEVOTHROID) 25 MCG tablet   Oral   Take 25 mcg by mouth daily before breakfast.         . meclizine (ANTIVERT) 25 MG tablet   Oral   Take 25 mg by mouth 3 (three) times daily as needed for dizziness.         . nitroGLYCERIN (NITROSTAT) 0.4 MG SL tablet   Sublingual   Place 0.4 mg under the tongue every 5 (five) minutes as needed for chest pain. May repeat times three.         . omega-3 acid ethyl esters (LOVAZA) 1 G capsule   Oral   Take 2 g by mouth 2 (two) times daily.         . promethazine (PHENERGAN) 25 MG tablet   Oral   Take 1 tablet (25 mg total) by mouth every 6 (six) hours as needed for nausea.   30 tablet   2   . ramipril (ALTACE) 2.5 MG capsule   Oral   Take 2.5 mg by mouth 2 (two) times daily.         . Ticagrelor (BRILINTA) 90 MG TABS tablet   Oral   Take 1 tablet (90 mg total) by mouth 2 (two) times daily.   60 tablet   3   . traMADol (ULTRAM) 50 MG tablet      Take 1 or 2 po Q 6hrs for pain   20 tablet   0    SpO2 98% Physical Exam  Nursing note and vitals reviewed. Constitutional: She is oriented to person, place, and time. She appears well-developed and well-nourished.  HENT:  Head: Normocephalic and atraumatic.  Eyes: EOM are normal. Pupils are equal, round, and reactive to light.  Neck: Neck supple. JVD present.  Cardiovascular: Normal rate, regular rhythm and intact distal pulses.   Murmur heard. Pulmonary/Chest: Effort normal. No respiratory distress.  Abdominal: Soft. She exhibits no distension. There is no tenderness. There is no rebound and no guarding.  Musculoskeletal: She exhibits no edema and no tenderness.  Neurological: She is  alert and oriented to person, place, and time.  Skin: Skin is warm and dry.    ED Course  Procedures (including critical care time) Labs Review Labs Reviewed  CBC WITH DIFFERENTIAL  BASIC METABOLIC PANEL  TROPONIN I  URINALYSIS, ROUTINE W REFLEX MICROSCOPIC   Imaging Review No results found.  EKG Interpretation   None       MDM  No diagnosis found.   Date: 10/31/2012  Rate: 63  Rhythm: normal sinus rhythm  QRS Axis: left  Intervals: normal  ST/T Wave abnormalities: nonspecific ST/T changes  Conduction Disutrbances:left bundle branch block  Narrative Interpretation:   Old EKG Reviewed: unchanged  Differential diagnosis includes: ACS syndrome CHF exacerbation Valvular disorder Myocarditis Pericarditis Pericardial effusion Pneumonia Pleural effusion Pulmonary edema PE Anemia Musculoskeletal pain Dissection  Pt comes in with cc of midsternal chest pain, radiating to the scapula - similar to her MI. EKG is unchanged. Pt has significant CAD, and the pain is typical, so concerns are for ACS. Will get initial labs  started, and give her nitro for now. ASA taken by the patient.    Derwood Kaplan, MD 10/31/12 0021  2:47 AM Nitro helped, patient has persistent pain - so we will start heparin and nitro gtt, Cards to admit.   Derwood Kaplan, MD 10/31/12 0248  CRITICAL CARE Performed by: Derwood Kaplan   Total critical care time: 30 minutes  Critical care time was exclusive of separately billable procedures and treating other patients.  Critical care was necessary to treat or prevent imminent or life-threatening deterioration.  Critical care was time spent personally by me on the following activities: development of treatment plan with patient and/or surrogate as well as nursing, discussions with consultants, evaluation of patient's response to treatment, examination of patient, obtaining history from patient or surrogate, ordering and performing  treatments and interventions, ordering and review of laboratory studies, ordering and review of radiographic studies, pulse oximetry and re-evaluation of patient's condition.   Derwood Kaplan, MD 10/31/12 [phone]

## 2012-10-30 NOTE — ED Notes (Signed)
Called to Pt. Home for c/o chest pain that started . PTA of EMS. Started in Left chest and between shoulder blades and feels like her last MI. She gave herself aspirin 325mg , phenergan 25mg  . Did not get Nitro d/t denial of C/P in route. After addition of O2.

## 2012-10-31 ENCOUNTER — Encounter (HOSPITAL_COMMUNITY): Payer: Self-pay | Admitting: Internal Medicine

## 2012-10-31 ENCOUNTER — Emergency Department (HOSPITAL_COMMUNITY): Payer: Medicare Other

## 2012-10-31 DIAGNOSIS — I200 Unstable angina: Secondary | ICD-10-CM

## 2012-10-31 LAB — COMPREHENSIVE METABOLIC PANEL
ALT: 140 U/L — ABNORMAL HIGH (ref 0–35)
AST: 232 U/L — ABNORMAL HIGH (ref 0–37)
CO2: 26 mEq/L (ref 19–32)
Chloride: 104 mEq/L (ref 96–112)
GFR calc non Af Amer: 54 mL/min — ABNORMAL LOW (ref >90)
Sodium: 141 mEq/L (ref 135–145)
Total Bilirubin: 0.4 mg/dL (ref 0.3–1.2)
Total Protein: 6.5 g/dL (ref 6.0–8.3)

## 2012-10-31 LAB — URINALYSIS, ROUTINE W REFLEX MICROSCOPIC
Glucose, UA: NEGATIVE mg/dL
Hgb urine dipstick: NEGATIVE
Specific Gravity, Urine: 1.019 (ref 1.005–1.030)
pH: 5.5 (ref 5.0–8.0)

## 2012-10-31 LAB — GLUCOSE, CAPILLARY
Glucose-Capillary: 220 mg/dL — ABNORMAL HIGH (ref 70–99)
Glucose-Capillary: 161 mg/dL — ABNORMAL HIGH (ref 70–99)
Glucose-Capillary: 300 mg/dL — ABNORMAL HIGH (ref 70–99)
Glucose-Capillary: 240 mg/dL — ABNORMAL HIGH (ref 70–99)

## 2012-10-31 LAB — CBC WITH DIFFERENTIAL/PLATELET
Eosinophils Absolute: 0.1 10*3/uL (ref 0.0–0.7)
Hemoglobin: 13.8 g/dL (ref 12.0–15.0)
Lymphocytes Relative: 31 % (ref 12–46)
Lymphs Abs: 3.2 10*3/uL (ref 0.7–4.0)
MCH: 30.4 pg (ref 26.0–34.0)
Monocytes Relative: 9 % (ref 3–12)
Neutro Abs: 6 10*3/uL (ref 1.7–7.7)
Neutrophils Relative %: 59 % (ref 43–77)
RBC: 4.54 MIL/uL (ref 3.87–5.11)

## 2012-10-31 LAB — BASIC METABOLIC PANEL
BUN: 21 mg/dL (ref 6–23)
CO2: 25 mEq/L (ref 19–32)
CO2: 25 mEq/L (ref 19–32)
Chloride: 101 mEq/L (ref 96–112)
Chloride: 104 mEq/L (ref 96–112)
Creatinine, Ser: 0.94 mg/dL (ref 0.50–1.10)
Glucose, Bld: 222 mg/dL — ABNORMAL HIGH (ref 70–99)
Potassium: 4 mEq/L (ref 3.5–5.1)
Potassium: 4.3 mEq/L (ref 3.5–5.1)
Sodium: 141 mEq/L (ref 135–145)

## 2012-10-31 LAB — URINE MICROSCOPIC-ADD ON

## 2012-10-31 LAB — HEPARIN LEVEL (UNFRACTIONATED)
Heparin Unfractionated: 0.1 IU/mL — ABNORMAL LOW (ref 0.30–0.70)
Heparin Unfractionated: 0.18 IU/mL — ABNORMAL LOW (ref 0.30–0.70)

## 2012-10-31 LAB — TSH: TSH: 0.629 u[IU]/mL (ref 0.350–4.500)

## 2012-10-31 LAB — CBC
Hemoglobin: 13.2 g/dL (ref 12.0–15.0)
MCV: 90.2 fL (ref 78.0–100.0)
Platelets: 214 10*3/uL (ref 150–400)
RBC: 4.39 MIL/uL (ref 3.87–5.11)
WBC: 7.7 10*3/uL (ref 4.0–10.5)

## 2012-10-31 LAB — PROTIME-INR: Prothrombin Time: 13.3 seconds (ref 11.6–15.2)

## 2012-10-31 LAB — TROPONIN I: Troponin I: <0.3 ng/mL (ref <0.30)

## 2012-10-31 MED ORDER — TICAGRELOR 90 MG PO TABS
180.0000 mg | ORAL_TABLET | Freq: Once | ORAL | Status: AC
  Administered 2012-10-31: 180 mg via ORAL
  Filled 2012-10-31 (×2): qty 2

## 2012-10-31 MED ORDER — NITROGLYCERIN 0.4 MG SL SUBL: 0.4000 mg | SUBLINGUAL_TABLET | SUBLINGUAL | Status: DC | PRN

## 2012-10-31 MED ORDER — HEPARIN BOLUS VIA INFUSION
2000.0000 [IU] | Freq: Once | INTRAVENOUS | Status: AC
  Administered 2012-10-31: 2000 [IU] via INTRAVENOUS
  Filled 2012-10-31: qty 2000

## 2012-10-31 MED ORDER — INSULIN ASPART 100 UNIT/ML ~~LOC~~ SOLN
0.0000 [IU] | Freq: Three times a day (TID) | SUBCUTANEOUS | Status: DC
  Administered 2012-10-31 – 2012-11-01 (×3): 11 [IU] via SUBCUTANEOUS
  Administered 2012-11-02: 8 [IU] via SUBCUTANEOUS
  Administered 2012-11-02: 11 [IU] via SUBCUTANEOUS
  Administered 2012-11-03: 8 [IU] via SUBCUTANEOUS
  Administered 2012-11-03: 5 [IU] via SUBCUTANEOUS
  Administered 2012-11-03: 8 [IU] via SUBCUTANEOUS
  Administered 2012-11-04 – 2012-11-05 (×6): 5 [IU] via SUBCUTANEOUS
  Administered 2012-11-05: 2 [IU] via SUBCUTANEOUS
  Administered 2012-11-06: 12:00:00 via SUBCUTANEOUS
  Administered 2012-11-06: 2 [IU] via SUBCUTANEOUS
  Administered 2012-11-06 – 2012-11-07 (×2): 5 [IU] via SUBCUTANEOUS
  Administered 2012-11-07 (×2): 3 [IU] via SUBCUTANEOUS
  Administered 2012-11-08: 8 [IU] via SUBCUTANEOUS
  Administered 2012-11-08: 5 [IU] via SUBCUTANEOUS
  Administered 2012-11-08: 8 [IU] via SUBCUTANEOUS
  Administered 2012-11-09: 5 [IU] via SUBCUTANEOUS
  Administered 2012-11-09: 3 [IU] via SUBCUTANEOUS

## 2012-10-31 MED ORDER — INFLUENZA VAC SPLIT QUAD 0.5 ML IM SUSP
0.5000 mL | INTRAMUSCULAR | Status: AC
  Administered 2012-11-01: 0.5 mL via INTRAMUSCULAR
  Filled 2012-10-31: qty 0.5

## 2012-10-31 MED ORDER — ONDANSETRON HCL 4 MG/2ML IJ SOLN
4.0000 mg | Freq: Once | INTRAMUSCULAR | Status: AC
  Administered 2012-10-31: 4 mg via INTRAVENOUS
  Filled 2012-10-31: qty 2

## 2012-10-31 MED ORDER — FLUTICASONE PROPIONATE 50 MCG/ACT NA SUSP
1.0000 | Freq: Every day | NASAL | Status: DC | PRN
  Administered 2012-11-09: 1 via NASAL
  Filled 2012-10-31: qty 16

## 2012-10-31 MED ORDER — ISOSORBIDE MONONITRATE ER 30 MG PO TB24
30.0000 mg | ORAL_TABLET | Freq: Every morning | ORAL | Status: DC
  Administered 2012-10-31 – 2012-11-10 (×11): 30 mg via ORAL
  Filled 2012-10-31 (×12): qty 1

## 2012-10-31 MED ORDER — INSULIN GLARGINE 100 UNIT/ML ~~LOC~~ SOLN: 60.0000 [IU] | Freq: Every day | SUBCUTANEOUS | Status: DC

## 2012-10-31 MED ORDER — ASPIRIN EC 81 MG PO TBEC: 81.0000 mg | DELAYED_RELEASE_TABLET | Freq: Every morning | ORAL | Status: DC

## 2012-10-31 MED ORDER — PROMETHAZINE HCL 25 MG PO TABS
25.0000 mg | ORAL_TABLET | Freq: Four times a day (QID) | ORAL | Status: DC | PRN
  Administered 2012-11-03 – 2012-11-04 (×3): 25 mg via ORAL
  Filled 2012-10-31 (×3): qty 1

## 2012-10-31 MED ORDER — ONDANSETRON HCL 4 MG/2ML IJ SOLN
4.0000 mg | Freq: Four times a day (QID) | INTRAMUSCULAR | Status: DC | PRN
  Administered 2012-11-04 – 2012-11-08 (×6): 4 mg via INTRAVENOUS
  Filled 2012-10-31 (×8): qty 2

## 2012-10-31 MED ORDER — ACETAMINOPHEN 325 MG PO TABS
650.0000 mg | ORAL_TABLET | ORAL | Status: DC | PRN
  Administered 2012-11-01 – 2012-11-08 (×11): 650 mg via ORAL
  Filled 2012-10-31 (×10): qty 2

## 2012-10-31 MED ORDER — INSULIN ASPART 100 UNIT/ML ~~LOC~~ SOLN: 12.0000 [IU] | Freq: Once | SUBCUTANEOUS | Status: DC

## 2012-10-31 MED ORDER — RAMIPRIL 2.5 MG PO CAPS
2.5000 mg | ORAL_CAPSULE | Freq: Every day | ORAL | Status: DC
  Administered 2012-10-31 – 2012-11-10 (×11): 2.5 mg via ORAL
  Filled 2012-10-31 (×11): qty 1

## 2012-10-31 MED ORDER — INSULIN ASPART 100 UNIT/ML ~~LOC~~ SOLN
12.0000 [IU] | Freq: Three times a day (TID) | SUBCUTANEOUS | Status: DC
  Administered 2012-10-31 – 2012-11-06 (×10): 12 [IU] via SUBCUTANEOUS
  Administered 2012-11-06 (×2): via SUBCUTANEOUS
  Administered 2012-11-07 – 2012-11-09 (×7): 12 [IU] via SUBCUTANEOUS

## 2012-10-31 MED ORDER — ASPIRIN EC 81 MG PO TBEC
81.0000 mg | DELAYED_RELEASE_TABLET | Freq: Every day | ORAL | Status: DC
  Administered 2012-11-01 – 2012-11-03 (×3): 81 mg via ORAL
  Filled 2012-10-31 (×3): qty 1

## 2012-10-31 MED ORDER — AMIODARONE HCL 200 MG PO TABS
200.0000 mg | ORAL_TABLET | Freq: Every day | ORAL | Status: DC
  Administered 2012-10-31 – 2012-11-01 (×2): 200 mg via ORAL
  Filled 2012-10-31 (×2): qty 1

## 2012-10-31 MED ORDER — INSULIN ASPART 100 UNIT/ML ~~LOC~~ SOLN
0.0000 [IU] | Freq: Every day | SUBCUTANEOUS | Status: DC
  Administered 2012-10-31: 2 [IU] via SUBCUTANEOUS
  Administered 2012-11-01: 3 [IU] via SUBCUTANEOUS
  Administered 2012-11-02: 4 [IU] via SUBCUTANEOUS
  Administered 2012-11-03 – 2012-11-09 (×5): 2 [IU] via SUBCUTANEOUS

## 2012-10-31 MED ORDER — NITROGLYCERIN 0.4 MG SL SUBL
0.4000 mg | SUBLINGUAL_TABLET | SUBLINGUAL | Status: DC | PRN
  Administered 2012-10-31: 0.4 mg via SUBLINGUAL

## 2012-10-31 MED ORDER — LEVOTHYROXINE SODIUM 25 MCG PO TABS
25.0000 ug | ORAL_TABLET | Freq: Every day | ORAL | Status: DC
  Administered 2012-10-31 – 2012-11-10 (×12): 25 ug via ORAL
  Filled 2012-10-31 (×15): qty 1

## 2012-10-31 MED ORDER — INSULIN LISPRO 100 UNIT/ML ~~LOC~~ SOLN: 15.0000 [IU] | Freq: Three times a day (TID) | SUBCUTANEOUS | Status: DC

## 2012-10-31 MED ORDER — FUROSEMIDE 40 MG PO TABS
40.0000 mg | ORAL_TABLET | Freq: Every day | ORAL | Status: DC
  Administered 2012-10-31 – 2012-11-10 (×11): 40 mg via ORAL
  Filled 2012-10-31 (×11): qty 1

## 2012-10-31 MED ORDER — HEPARIN BOLUS VIA INFUSION
3000.0000 [IU] | Freq: Once | INTRAVENOUS | Status: AC
  Administered 2012-10-31: 3000 [IU] via INTRAVENOUS
  Filled 2012-10-31: qty 3000

## 2012-10-31 MED ORDER — ASPIRIN 81 MG PO CHEW: 324.0000 mg | CHEWABLE_TABLET | ORAL | Status: DC

## 2012-10-31 MED ORDER — HEPARIN (PORCINE) IN NACL 100-0.45 UNIT/ML-% IJ SOLN
1400.0000 [IU]/h | INTRAMUSCULAR | Status: DC
  Administered 2012-10-31: 1000 [IU]/h via INTRAVENOUS
  Administered 2012-10-31: 1400 [IU]/h via INTRAVENOUS
  Filled 2012-10-31 (×3): qty 250

## 2012-10-31 MED ORDER — DEXTROSE 5 % IV SOLN
1.0000 g | Freq: Once | INTRAVENOUS | Status: AC
  Administered 2012-10-31: 1 g via INTRAVENOUS
  Filled 2012-10-31: qty 10

## 2012-10-31 MED ORDER — ASPIRIN 300 MG RE SUPP: 300.0000 mg | RECTAL | Status: DC

## 2012-10-31 MED ORDER — LORATADINE 10 MG PO TABS
10.0000 mg | ORAL_TABLET | Freq: Every day | ORAL | Status: DC
  Administered 2012-10-31 – 2012-11-10 (×11): 10 mg via ORAL
  Filled 2012-10-31 (×11): qty 1

## 2012-10-31 MED ORDER — FOLIC ACID 0.5 MG HALF TAB
500.0000 ug | ORAL_TABLET | Freq: Every day | ORAL | Status: DC
  Administered 2012-10-31 – 2012-11-10 (×11): 0.5 mg via ORAL
  Filled 2012-10-31 (×12): qty 1

## 2012-10-31 MED ORDER — AMIODARONE HCL 200 MG PO TABS
200.0000 mg | ORAL_TABLET | Freq: Two times a day (BID) | ORAL | Status: DC
  Filled 2012-10-31 (×2): qty 1

## 2012-10-31 MED ORDER — MECLIZINE HCL 25 MG PO TABS
25.0000 mg | ORAL_TABLET | Freq: Two times a day (BID) | ORAL | Status: DC
  Administered 2012-10-31 – 2012-11-10 (×20): 25 mg via ORAL
  Filled 2012-10-31 (×22): qty 1

## 2012-10-31 MED ORDER — NITROGLYCERIN IN D5W 200-5 MCG/ML-% IV SOLN
2.0000 ug/min | INTRAVENOUS | Status: DC
  Filled 2012-10-31: qty 250

## 2012-10-31 MED ORDER — ASPIRIN EC 81 MG PO TBEC
324.0000 mg | DELAYED_RELEASE_TABLET | Freq: Once | ORAL | Status: AC
  Administered 2012-10-31: 324 mg via ORAL
  Filled 2012-10-31: qty 4

## 2012-10-31 MED ORDER — INSULIN ASPART 100 UNIT/ML ~~LOC~~ SOLN
0.0000 [IU] | Freq: Once | SUBCUTANEOUS | Status: AC
  Administered 2012-10-31: 8 [IU] via SUBCUTANEOUS

## 2012-10-31 NOTE — Progress Notes (Signed)
ANTICOAGULATION CONSULT NOTE - Follow Up Consult  Pharmacy Consult for Heparin Indication: chest pain/ACS  Allergies  Allergen Reactions  . Atorvastatin Other (See Comments)    Unknown-patient admitted to hospital after taking  . Citrus Dermatitis    BREAK OUT ON BODY  . Contrast Media [Iodinated Diagnostic Agents] Other (See Comments)    Passed out  . Iohexol      Desc: ANAPHYLAXIS   . Plavix [Clopidogrel Bisulfate]     Not an allergy - but PRU was drawn 06/2012 indicating Plavix hyporesponsiveness, thus she was changed to Brilinta.  . Zolpidem Tartrate     REACTION: hallucinations  . Milk-Related Compounds Rash    Patient Measurements: Height: 5\' 5"  (165.1 cm) Weight: 197 lb (89.359 kg) IBW/kg (Calculated) : 57 Heparin Dosing Weight: 76 kg  Vital Signs: Temp: 98.8 F (37.1 C) (10/12 0456) Temp src: Oral (10/12 0011) BP: 144/61 mmHg (10/12 0456) Pulse Rate: 58 (10/12 0456)  Labs:  Recent Labs  10/31/12 0030 10/31/12 0830 10/31/12 0949  HGB 13.8 13.2  --   HCT 40.7 39.6  --   PLT 234 214  --   LABPROT  --  13.3  --   INR  --  1.03  --   HEPARINUNFRC  --   --  0.10*  CREATININE 1.04 0.94  0.97  --   TROPONINI <0.30 <0.30  --     Estimated Creatinine Clearance: 52.7 ml/min (by C-G formula based on Cr of 0.94).   Medications:  Heparin at 1000 units/hr  Assessment: 79 y/o F on heparin for CP/ACS. Plan for myoview in the AM. HL this AM is 0.10. CBC ok, renal function ok, no overt bleeding noted.   Goal of Therapy:  Heparin level 0.3-0.7 units/ml Monitor platelets by anticoagulation protocol: Yes   Plan:  -Increase heparin to 1200 units/hr -8 hour HL at 1930 -Daily CBC/HL -Monitor for bleeding  Thank you for allowing me to take part in this patient's care,  Abran Duke, PharmD Clinical Pharmacist Phone: [phone] Pager: [phone] 10/31/2012 11:06 AM

## 2012-10-31 NOTE — Progress Notes (Signed)
ANTICOAGULATION CONSULT NOTE - Follow Up Consult  Pharmacy Consult for Heparin Indication: chest pain/ACS  Allergies  Allergen Reactions  . Atorvastatin Other (See Comments)    Unknown-patient admitted to hospital after taking  . Citrus Dermatitis    BREAK OUT ON BODY  . Contrast Media [Iodinated Diagnostic Agents] Other (See Comments)    Passed out  . Iohexol      Desc: ANAPHYLAXIS   . Plavix [Clopidogrel Bisulfate]     Not an allergy - but PRU was drawn 06/2012 indicating Plavix hyporesponsiveness, thus she was changed to Brilinta.  . Zolpidem Tartrate     REACTION: hallucinations  . Milk-Related Compounds Rash    Labs:  Recent Labs  10/31/12 0030 10/31/12 0830 10/31/12 0949 10/31/12 1220 10/31/12 1905  HGB 13.8 13.2  --   --   --   HCT 40.7 39.6  --   --   --   PLT 234 214  --   --   --   LABPROT  --  13.3  --   --   --   INR  --  1.03  --   --   --   HEPARINUNFRC  --   --  0.10*  --  0.18*  CREATININE 1.04 0.94  0.97  --   --   --   TROPONINI <0.30 <0.30  --  <0.30  --     Estimated Creatinine Clearance: 52.7 ml/min (by C-G formula based on Cr of 0.94).   Medications:  Heparin at 1000 units/hr  Assessment: 79 y/o F on heparin for CP/ACS. Plan for myoview in the AM. HL this PM is 0.18.  Goal of Therapy:  Heparin level 0.3-0.7 units/ml Monitor platelets by anticoagulation protocol: Yes   Plan:  1) Heparin 2000 units iv bolus x 1 2) Increase heparin drip to 1400 units / hr 3) Follow up Am labs  Thank you.  Okey Regal, PharmD [phone]  I10/12/2012 7:53 PM

## 2012-10-31 NOTE — H&P (Signed)
Physician History and Physical    Cathy [name] MRN: [id number] DOB/AGE: Jun 02, 1930 79 y.o. Admit date: 10/30/2012  Primary Cardiologist:  Dr. Myrtis Ser  CC:  Chest pain  HPI:  79 yo F with h/o CAD, NSTEMI s/p remote CABG, multiple PCIs with most recent in 06/2012 with Promus to Ramus with transient perforation resolved post balloon and stenting, who p/w typical chest pain today lasted 30 min. Pressure like, and relieved with Nitro. ECG stable from prior. He was started on heparin drip due to concern of UA. He had a negative first Troponin and is mostly asymptomatic upon admission by cardiology. She described the pain as same as when she had a heart attack. She said she stopped taking Brilinta 1 month after last cath due to inability to afford the medication.   Review of systems: A review of 10 organ systems was done and is negative except as stated above in HPI  Past Medical History  Diagnosis Date  . Hypertension     Unspecified  . Hyperlipidemia     Mixed, statin intolerance.  [name] CAD (coronary artery disease) 08/21/09    a. CAD s/p CABG 2003. b. Cath 2006: occluded OM vein graft. c. Anterior MI 04/15/12 s/p PTCA only to ramus (stenting unsuccessful at that time) d. NSTEMI 06/2012: failed med rx, s/p DES to ramus c/b vessel perforation tx with balloon/stenting. Plavix hyporesponsive so changed to Brilinta.  . S/P CABG (coronary artery bypass graft) 2003     2003  LIMA, LAD, SVG to the OM, SVG right coronary artery  . Thoracic ascending aortic aneurysm 2003    Ascending thoracic aneurysm repair with a graft at time of CABG  . Diabetes mellitus     Insulin dependent  . Gastroparesis   . Depression   . Allergy history, radiographic dye     Contrast dye allergy  . Ejection fraction     EF 50%  //  Ejection fraction 25-30% at the time of stress echo,  May, 2013  . Previous back surgery   . Syncope 2008    Question?  2008  . Systolic CHF   . Ischemic cardiomyopathy 06/2011    a. Echo  04/19/12: EF 25-30%, mod LVH, diffuse HK, trivial AI  . Hypotension     October, 2013  . Hypothyroidism     Treated with low-dose Synthroid  . CKD (chronic kidney disease)   . Carotid artery disease     a. 80-99% prox RICA stenosis - with recent cardiac event 06/2012, favor waiting at least 1 month, optimally 3 months post PCI.  [name] Hypertriglyceridemia   . Statin intolerance   . Peripheral neuropathy   . Atrial fibrillation     a. Noted during 06/2012 hospitalization. Placed on amiodarone. Plan is for event monitor to assess for breakthrough, hold off anticoag unless additional AF seen.  [name] NSVT (nonsustained ventricular tachycardia)     a. Noted during 06/2012 hospitalization.  . Hypotension    Past Surgical History  Procedure Laterality Date  . Coronary artery bypass graft  05/05/2001     LIMA, LAD, SVG to the OM, SVG right coronary artery  . Bladder tacking    . Salivary gland surgery    . Total abdominal hysterectomy    . Back surgery    . Breast surgery    . External ear surgery    . Cardiac catheterization  04/15/12, 07/05/12    60-70% proximal LAD, 95% mid AV groove circumflex s/p PTCA, proximal  RCA occlusion, patent LIMA to LAD, patent SVG to distal RCA, chronically occluded SVG to circumflex; LVEF 25-30%   History   Social History  . Marital Status: Divorced    Spouse Name: N/A    Number of Children: N/A  . Years of Education: N/A   Occupational History  . Retired    Social History Main Topics  . Smoking status: Never Smoker   . Smokeless tobacco: Never Used  . Alcohol Use: 1.5 oz/week    3 drink(s) per week  . Drug Use: No  . Sexual Activity: Not Currently   Other Topics Concern  . Not on file   Social History Narrative  . No narrative on file    Family History  Problem Relation Age of Onset  . Heart disease Other   . Heart disease Mother   . Hypertension Mother   . Heart disease Father   . Hypertension Father      Allergies  Allergen Reactions  .  Atorvastatin Other (See Comments)    Unknown-patient admitted to hospital after taking  . Citrus Dermatitis    BREAK OUT ON BODY  . Contrast Media [Iodinated Diagnostic Agents] Other (See Comments)    Passed out  . Iohexol      Desc: ANAPHYLAXIS   . Plavix [Clopidogrel Bisulfate]     Not an allergy - but PRU was drawn 06/2012 indicating Plavix hyporesponsiveness, thus she was changed to Brilinta.  . Zolpidem Tartrate     REACTION: hallucinations  . Milk-Related Compounds Rash    No current facility-administered medications on file prior to encounter.   Current Outpatient Prescriptions on File Prior to Encounter  Medication Sig Dispense Refill  . amiodarone (PACERONE) 200 MG tablet Take 1 tablet (200 mg total) by mouth 2 (two) times daily.  60 tablet  3  . aspirin EC 81 MG tablet Take 81 mg by mouth every morning.      . cetirizine (ZYRTEC) 10 MG tablet Take 10 mg by mouth daily.      . fluticasone (FLONASE) 50 MCG/ACT nasal spray Place 1 spray into the nose daily as needed for allergies.      . furosemide (LASIX) 40 MG tablet Take 1 tablet (40 mg total) by mouth daily.  30 tablet  3  . isosorbide mononitrate (IMDUR) 30 MG 24 hr tablet Take 1 tablet (30 mg total) by mouth every morning.  30 tablet  3  . levothyroxine (SYNTHROID, LEVOTHROID) 25 MCG tablet Take 25 mcg by mouth daily before breakfast.      . promethazine (PHENERGAN) 25 MG tablet Take 1 tablet (25 mg total) by mouth every 6 (six) hours as needed for nausea.  30 tablet  2  . nitroGLYCERIN (NITROSTAT) 0.4 MG SL tablet Place 0.4 mg under the tongue every 5 (five) minutes as needed for chest pain. May repeat times three.        Physical Exam: Blood pressure 107/51, pulse 65, temperature 98.2 F (36.8 C), temperature source Oral, resp. rate 16, SpO2 96.00%.; There is no weight on file to calculate BMI. Temp:  [98.2 F (36.8 C)] 98.2 F (36.8 C) (10/12 0011) Pulse Rate:  [62-65] 65 (10/12 0030) Resp:  [16-22] 16 (10/12  0030) BP: (107-130)/(51-57) 107/51 mmHg (10/12 0030) SpO2:  [96 %-98 %] 96 % (10/12 0030)  No intake or output data in the 24 hours ending 10/31/12 0224 General: NAD Heent: MMM Neck: No JVD  CV: Nondisplaced PMI.  RRR, nl S1/S2, no S3/S4,  no murmur. No carotid bruit   Lungs: Clear to auscultation bilaterally with normal respiratory effort Abdomen: Soft, nontender, nondistended Extremities: No clubbing or cyanosis.  Normal pedal pulses. Trace LE edema Skin: Intact without lesions or rashes  Neurologic: Alert and oriented x 3, grossly nonfocal  Psych: Normal mood and affect    Labs:  Recent Labs  10/31/12 0030  TROPONINI <0.30   Lab Results  Component Value Date   WBC 10.2 10/31/2012   HGB 13.8 10/31/2012   HCT 40.7 10/31/2012   MCV 89.6 10/31/2012   PLT 234 10/31/2012    Recent Labs Lab 10/31/12 0030  NA 138  K 4.0  CL 101  CO2 25  BUN 21  CREATININE 1.04  CALCIUM 9.0  GLUCOSE 222*   Lab Results  Component Value Date   CHOL 188 06/27/2012   HDL 32* 06/27/2012   LDLCALC 86 06/27/2012   TRIG 351* 06/27/2012       EKG:  Sinus with LBBB preexisting on previous ECGs  Radiology:  Dg Chest Port 1 View  10/31/2012   *RADIOLOGY REPORT*  Clinical Data: Chest pain  PORTABLE CHEST - 1 VIEW  Comparison: Prior radiograph from 06/26/2012  Findings: Median sternotomy wires with underlying CABG markers are unchanged.  Cardiomegaly is stable.  The lungs are normally inflated.  There is mild central pulmonary vascular congestion without frank pulmonary edema.  No focal infiltrate is identified.  No pneumothorax or pleural effusion.  Osseous structures are unchanged.  Extensive degenerative changes are noted about both shoulders.  IMPRESSION: Cardiomegaly with mild pulmonary vascular congestion.  No frank pulmonary edema or focal infiltrate identified.   Original Report Authenticated By: Rise Mu, M.D.    ASSESSMENT:  79 yo F with h/o CAD, NSTEMI s/p remote CABG, multiple  PCIs with most recent in 06/2012 with Promus to Ramus with transient perforation resolved post balloon and stenting, who p/w chest pain concerning for unstable angina  IMPRESSION: 1. Unstable angina 2. ICM w EF 35% 3. DM2 on insulin 4. Contrast dye allergy     PLAN:  1. Heparin gtt for >48 hrs. 2. Troponin Q6h times 3. Telemetry monitor 3. ASA, Nitro PRN, and ACEI. Patient allergic to statin and is hyporesponsive to Plavix. 4. Resume Brilinta 4. Consider cath if Troponin elevates significantly. Otherwise PET stress.  5. NPO and will hold scheduled insulin as patient did not eat dinner last night, continue sliding scale insulin 6. Continue home Lasix.   Signed: Haydee Salter, MD Cardiology Fellow 10/31/2012, 2:24 AM

## 2012-10-31 NOTE — Progress Notes (Signed)
Utilization Review Completed.Cathy [name] T10/12/2012  

## 2012-10-31 NOTE — Progress Notes (Signed)
Patient Name: Cathy [name]      SUBJECTIVE: 79 yo F with h/o CAD, NSTEMI s/p remote CABG, multiple PCIs with most recent in 06/2012 with Promus to Ramus with transient perforation resolved post balloon and stenting, who p/w typical chest pain today lasted 30 min. Pressure like, and relieved with Nitro.   She described the pain as same as when she had a heart attack.   She said she stopped taking Brilinta 1 month after last cath due to inability to afford the medication.   ECG unchanged,  Tn neg   Past Medical History  Diagnosis Date  . Hypertension     Unspecified  . Hyperlipidemia     Mixed, statin intolerance.  [name] CAD (coronary artery disease) 08/21/09    a. CAD s/p CABG 2003. b. Cath 2006: occluded OM vein graft. c. Anterior MI 04/15/12 s/p PTCA only to ramus (stenting unsuccessful at that time) d. NSTEMI 06/2012: failed med rx, s/p DES to ramus c/b vessel perforation tx with balloon/stenting. Plavix hyporesponsive so changed to Brilinta.  . S/P CABG (coronary artery bypass graft) 2003     2003  LIMA, LAD, SVG to the OM, SVG right coronary artery  . Thoracic ascending aortic aneurysm 2003    Ascending thoracic aneurysm repair with a graft at time of CABG  . Diabetes mellitus     Insulin dependent  . Gastroparesis   . Depression   . Allergy history, radiographic dye     Contrast dye allergy  . Previous back surgery   . Syncope 2008    Question?  2008  . Systolic CHF   . Ischemic cardiomyopathy 06/2011    a. Echo 04/19/12: EF 25-30%, mod LVH, diffuse HK, trivial AI  . Hypotension     October, 2013  . Hypothyroidism     Treated with low-dose Synthroid  . CKD (chronic kidney disease)   . Carotid artery disease     a. 80-99% prox RICA stenosis - with recent cardiac event 06/2012, favor waiting at least 1 month, optimally 3 months post PCI.  [name] Hypertriglyceridemia   . Statin intolerance   . Peripheral neuropathy   . Atrial fibrillation     a. Noted during 06/2012  hospitalization. Placed on amiodarone. Plan is for event monitor to assess for breakthrough, hold off anticoag unless additional AF seen.  [name] NSVT (nonsustained ventricular tachycardia)     a. Noted during 06/2012 hospitalization.    Scheduled Meds:  Scheduled Meds: . amiodarone  200 mg Oral BID  . [START ON 11/01/2012] aspirin EC  81 mg Oral Daily  . furosemide  40 mg Oral Daily  . [START ON 11/01/2012] influenza vac split quadrivalent PF  0.5 mL Intramuscular Tomorrow-1000  . isosorbide mononitrate  30 mg Oral q morning - 10a  . levothyroxine  25 mcg Oral QAC breakfast  . meclizine  25 mg Oral BID  . ramipril  2.5 mg Oral Daily   Continuous Infusions: . heparin 1,000 Units/hr (10/31/12 0302)    PHYSICAL EXAM Filed Vitals:   10/31/12 0315 10/31/12 0330 10/31/12 0345 10/31/12 0456  BP: 130/57 142/95 143/41 144/61  Pulse: 58 58 58 58  Temp:    98.8 F (37.1 C)  TempSrc:      Resp: 20 21 19 20   Height:    5\' 5"  (1.651 m)  Weight:    197 lb (89.359 kg)  SpO2: 99% 99% 97% 97%    General appearance: alert,  cooperative and no distress Neck: supple Lungs: clear to auscultation bilaterally Shest wall tenderness Heart: regular rate and rhythm Abdomen: soft, non-tender; bowel sounds normal; no masses,  no organomegaly Extremities: extremities normal, atraumatic, no cyanosis or edema Pulses: 2+ and symmetric Skin: Skin color, texture, turgor normal. No rashes or lesions Neurologic: Grossly normal  TELEMETRY: Reviewed telemetry pt in nsr  No intake or output data in the 24 hours ending 10/31/12 0948  LABS: Basic Metabolic Panel:  Recent Labs Lab 10/31/12 0030  NA 138  K 4.0  CL 101  CO2 25  GLUCOSE 222*  BUN 21  CREATININE 1.04  CALCIUM 9.0   Cardiac Enzymes:  Recent Labs  10/31/12 0030 10/31/12 0830  TROPONINI <0.30 <0.30   CBC:  Recent Labs Lab 10/31/12 0030 10/31/12 0830  WBC 10.2 7.7  NEUTROABS 6.0  --   HGB 13.8 13.2  HCT 40.7 39.6  MCV 89.6  90.2  PLT 234 214   PROTIME:  Recent Labs  10/31/12 0830  LABPROT 13.3  INR 1.03   Liver Function Tests: No results found for this basename: AST, ALT, ALKPHOS, BILITOT, PROT, ALBUMIN,  in the last 72 hours No results found for this basename: LIPASE, AMYLASE,  in the last 72 hours BNP:    ASSESSMENT AND PLAN:  Active Problems:   CAD (coronary artery disease)   Atrial fibrillation   NSVT (nonsustained ventricular tachycardia)  recurrent chest pain,  Will defer management decision to Dr Barbette Reichmann but  In absence of objective data will tentativley schedule for myoview in am  Also will decrease amio 400>>200   TSH pending and LFT s will be ordered for surveillance  PULM funt tests will also be ordered  Signed, Sherryl Manges MD  10/31/2012

## 2012-10-31 NOTE — ED Notes (Signed)
Admit MD at bedside. Asked Pt. About CP. He does not want Nitro Gtt. Started at this time feels B/P is too soft and also wants to reserve Nitro Gtt. Only if she has severe CP.

## 2012-10-31 NOTE — Progress Notes (Signed)
ANTICOAGULATION CONSULT NOTE - Initial Consult  Pharmacy Consult for heparin  Indication: chest pain/ACS  Allergies  Allergen Reactions  . Atorvastatin Other (See Comments)    Unknown-patient admitted to hospital after taking  . Citrus Dermatitis    BREAK OUT ON BODY  . Contrast Media [Iodinated Diagnostic Agents] Other (See Comments)    Passed out  . Iohexol      Desc: ANAPHYLAXIS   . Plavix [Clopidogrel Bisulfate]     Not an allergy - but PRU was drawn 06/2012 indicating Plavix hyporesponsiveness, thus she was changed to Brilinta.  . Zolpidem Tartrate     REACTION: hallucinations  . Milk-Related Compounds Rash    Patient Measurements:    Dosing Weight: 84kg   Vital Signs: Temp: 98.2 F (36.8 C) (10/12 0011) Temp src: Oral (10/12 0011) BP: 107/51 mmHg (10/12 0030) Pulse Rate: 65 (10/12 0030)  Labs:  Recent Labs  10/31/12 0030  HGB 13.8  HCT 40.7  PLT 234  CREATININE 1.04  TROPONINI <0.30    The CrCl is unknown because both a height and weight (above a minimum accepted value) are required for this calculation.   Medical History: Past Medical History  Diagnosis Date  . Hypertension     Unspecified  . Hyperlipidemia     Mixed, statin intolerance.  [name] CAD (coronary artery disease) 08/21/09    a. CAD s/p CABG 2003. b. Cath 2006: occluded OM vein graft. c. Anterior MI 04/15/12 s/p PTCA only to ramus (stenting unsuccessful at that time) d. NSTEMI 06/2012: failed med rx, s/p DES to ramus c/b vessel perforation tx with balloon/stenting. Plavix hyporesponsive so changed to Brilinta.  . S/P CABG (coronary artery bypass graft) 2003     2003  LIMA, LAD, SVG to the OM, SVG right coronary artery  . Thoracic ascending aortic aneurysm 2003    Ascending thoracic aneurysm repair with a graft at time of CABG  . Diabetes mellitus     Insulin dependent  . Gastroparesis   . Depression   . Allergy history, radiographic dye     Contrast dye allergy  . Ejection fraction     EF  50%  //  Ejection fraction 25-30% at the time of stress echo,  May, 2013  . Previous back surgery   . Syncope 2008    Question?  2008  . Systolic CHF   . Ischemic cardiomyopathy 06/2011    a. Echo 04/19/12: EF 25-30%, mod LVH, diffuse HK, trivial AI  . Hypotension     October, 2013  . Hypothyroidism     Treated with low-dose Synthroid  . CKD (chronic kidney disease)   . Carotid artery disease     a. 80-99% prox RICA stenosis - with recent cardiac event 06/2012, favor waiting at least 1 month, optimally 3 months post PCI.  [name] Hypertriglyceridemia   . Statin intolerance   . Peripheral neuropathy   . Atrial fibrillation     a. Noted during 06/2012 hospitalization. Placed on amiodarone. Plan is for event monitor to assess for breakthrough, hold off anticoag unless additional AF seen.  [name] NSVT (nonsustained ventricular tachycardia)     a. Noted during 06/2012 hospitalization.  . Hypotension     Medications:   (Not in a hospital admission)  Assessment: Pt with significant cardiac hx including CAD and thoracic aortic aneurysm repair along at time of CABG in 2003, carotid artery disease, DM, ICM   New onset cp resembling previous mi   Goal of Therapy:  Heparin level 0.3-0.7 units/ml Monitor platelets by anticoagulation protocol: Yes   Plan:  Heparin 3000 unitsx1 then 1000 units/hr  8 hour HL  Daily  HL and cbc starting 10/13  Janice Coffin 10/31/2012,2:02 AM

## 2012-11-01 ENCOUNTER — Observation Stay (HOSPITAL_COMMUNITY): Payer: Medicare Other

## 2012-11-01 ENCOUNTER — Inpatient Hospital Stay (HOSPITAL_COMMUNITY): Payer: Medicare Other

## 2012-11-01 ENCOUNTER — Telehealth: Payer: Self-pay

## 2012-11-01 DIAGNOSIS — R079 Chest pain, unspecified: Secondary | ICD-10-CM

## 2012-11-01 LAB — HEPARIN LEVEL (UNFRACTIONATED)
Heparin Unfractionated: 0.32 IU/mL (ref 0.30–0.70)
Heparin Unfractionated: 0.28 IU/mL — ABNORMAL LOW (ref 0.30–0.70)

## 2012-11-01 LAB — GLUCOSE, CAPILLARY
Glucose-Capillary: 338 mg/dL — ABNORMAL HIGH (ref 70–99)
Glucose-Capillary: 318 mg/dL — ABNORMAL HIGH (ref 70–99)
Glucose-Capillary: 301 mg/dL — ABNORMAL HIGH (ref 70–99)
Glucose-Capillary: 292 mg/dL — ABNORMAL HIGH (ref 70–99)

## 2012-11-01 MED ORDER — TECHNETIUM TC 99M SESTAMIBI GENERIC - CARDIOLITE
10.0000 | Freq: Once | INTRAVENOUS | Status: AC | PRN
  Administered 2012-11-01: 10 via INTRAVENOUS

## 2012-11-01 MED ORDER — TECHNETIUM TC 99M SESTAMIBI GENERIC - CARDIOLITE
30.0000 | Freq: Once | INTRAVENOUS | Status: AC | PRN
  Administered 2012-11-01: 30 via INTRAVENOUS

## 2012-11-01 MED ORDER — CLOPIDOGREL BISULFATE 75 MG PO TABS: 75.0000 mg | ORAL_TABLET | Freq: Every day | ORAL | Status: DC

## 2012-11-01 MED ORDER — ACETAMINOPHEN 325 MG PO TABS
ORAL_TABLET | ORAL | Status: AC
  Filled 2012-11-01: qty 2

## 2012-11-01 MED ORDER — TICAGRELOR 90 MG PO TABS
90.0000 mg | ORAL_TABLET | Freq: Two times a day (BID) | ORAL | Status: DC
  Administered 2012-11-01 – 2012-11-03 (×4): 90 mg via ORAL
  Filled 2012-11-01 (×5): qty 1

## 2012-11-01 MED ORDER — REGADENOSON 0.4 MG/5ML IV SOLN
0.4000 mg | Freq: Once | INTRAVENOUS | Status: AC
  Administered 2012-11-01: 0.4 mg via INTRAVENOUS
  Filled 2012-11-01: qty 5

## 2012-11-01 MED ORDER — REGADENOSON 0.4 MG/5ML IV SOLN
INTRAVENOUS | Status: AC
  Filled 2012-11-01: qty 5

## 2012-11-01 MED ORDER — INSULIN GLARGINE 100 UNIT/ML ~~LOC~~ SOLN
30.0000 [IU] | Freq: Every day | SUBCUTANEOUS | Status: DC
  Administered 2012-11-01: 30 [IU] via SUBCUTANEOUS
  Filled 2012-11-01: qty 0.3

## 2012-11-01 NOTE — Progress Notes (Signed)
Patient ID: Cathy [name], female   DOB: 1930-11-19, 79 y.o.   MRN: [id number]  THE nuclear scan reveals scar but no ischemia. Ejection fraction is 41%.  The patient's liver function studies were normal in June, 2014. There no abnormal. Ultrasound is pending. Amiodarone is new since her prior normal liver functions. This may be a problem with amiodarone.  Jerral Bonito, MD

## 2012-11-01 NOTE — Evaluation (Signed)
Physical Therapy Evaluation Patient Details Name: Cathy [name] MRN: [id number] DOB: 12/30/1930 Today's Date: 11/01/2012 Time: 4098-1191 PT Time Calculation (min): 19 min  PT Assessment / Plan / Recommendation History of Present Illness  79 y.o. female admitted to [facility] on 10/31/10 with [place].  Of note, pt with h/o chronic back and neck pain.    Clinical Impression  Ms. Jarvie is generally deconditioned and weak on her feet.  She is at risk for recurrent falls and would benefit from HHPT and aide for bathing at discharge.   PT to follow acutely for deficits listed below.       PT Assessment  Patient needs continued PT services    Follow Up Recommendations  Home health PT;Supervision - Intermittent (requesting Ben from [facility])    Does the patient have the potential to tolerate intense rehabilitation     NA  Barriers to Discharge   None      Equipment Recommendations  None recommended by PT    Recommendations for Other Services   None  Frequency Min 3X/week    Precautions / Restrictions Precautions Precautions: Fall   Pertinent Vitals/Pain DOE 3/4, O2 sats 97% on RA, HR 81 bpm with gait.        Mobility  Bed Mobility Bed Mobility: Sit to Supine;Scooting to HOB Sit to Supine: 6: Modified independent (Device/Increase time);With rail;HOB flat Scooting to HOB: 6: Modified independent (Device/Increase time);With rail Details for Bed Mobility Assistance: used railing to move around and position in the bed.   Transfers Transfers: Sit to Stand;Stand to Sit Sit to Stand: 5: Supervision;With upper extremity assist;From bed Stand to Sit: 5: Supervision;With upper extremity assist;To bed Details for Transfer Assistance: supervision for safety, verbal cues for safe hand placement.   Ambulation/Gait Ambulation/Gait Assistance: 4: Min assist Ambulation Distance (Feet): 100 Feet Assistive device: Rolling walker Ambulation/Gait Assistance Details: min assist to support trunk over weak  legs second half of walk due to pt fatigued quicly.  Increased DOE with gait to 3/4 O2 sats on RA were 98%.  HR 81 bpm with gait.  Max verbal cues to stay closer to RW while walking for safety and support.   Gait Pattern: Step-through pattern;Shuffle;Trunk flexed Gait velocity: decreased        PT Diagnosis: Difficulty walking;Abnormality of gait;Generalized weakness  PT Problem List: Decreased strength;Decreased activity tolerance;Decreased mobility;Decreased balance;Decreased knowledge of use of DME;Obesity;Cardiopulmonary status limiting activity;Pain PT Treatment Interventions: DME instruction;Gait training;Functional mobility training;Therapeutic activities;Therapeutic exercise;Balance training;Neuromuscular re-education;Patient/family education;Modalities     PT Goals(Current goals can be found in the care plan section) Acute Rehab PT Goals Patient Stated Goal: to get stronger, reduce pain PT Goal Formulation: With patient Time For Goal Achievement: 11/15/12 Potential to Achieve Goals: Good  Visit Information  Last PT Received On: 11/01/12 Assistance Needed: +1 History of Present Illness: 79 y.o. female admitted to [facility] on 10/31/10 with [place].  Of note, pt with h/o chronic back and neck pain.         Prior Functioning  Home Living Family/patient expects to be discharged to:: Private residence Living Arrangements: Alone Available Help at Discharge: Family;Personal care attendant;Available PRN/intermittently Type of Home: Mobile home Home Access: Ramped entrance Home Layout: One level Home Equipment: Walker - 4 wheels;Cane - single point Additional Comments: uses cane in home because there is not room for RW.   Prior Function Level of Independence: Needs assistance Gait / Transfers Assistance Needed: mod I with cane/RW ADL's / Homemaking Assistance Needed: assist for bathing, cleaning,  shopping.   Communication / Swallowing Assistance Needed: none Comments: does not drive,  relies on family to go to the store for her.   Communication Communication: No difficulties Dominant Hand: Right    Cognition  Cognition Arousal/Alertness: Awake/alert Behavior During Therapy: WFL for tasks assessed/performed Overall Cognitive Status: Within Functional Limits for tasks assessed    Extremity/Trunk Assessment Upper Extremity Assessment Upper Extremity Assessment: LUE deficits/detail LUE Deficits / Details: per pt report her left shoulder is her "bad" shoulder.   Lower Extremity Assessment Lower Extremity Assessment: Generalized weakness Cervical / Trunk Assessment Cervical / Trunk Assessment: Kyphotic;Other exceptions Cervical / Trunk Exceptions: forward head, reports chronic back and neck pain, reports she is supposed to have surgery on her neck.     Balance Balance Balance Assessed: Yes Static Sitting Balance Static Sitting - Balance Support: Feet supported Static Sitting - Level of Assistance: 7: Independent Static Standing Balance Static Standing - Balance Support: Bilateral upper extremity supported Static Standing - Level of Assistance: 5: Stand by assistance Dynamic Standing Balance Dynamic Standing - Balance Support: Bilateral upper extremity supported Dynamic Standing - Level of Assistance: 4: Min assist  End of Session PT - End of Session Activity Tolerance: Patient limited by fatigue;Patient limited by pain Patient left: in bed;with call bell/phone within reach;with bed alarm set  GP Functional Assessment Tool Used: assist level Functional Limitation: Mobility: Walking and moving around Mobility: Walking and Moving Around Current Status (O1308): At least 1 percent but less than 20 percent impaired, limited or restricted Mobility: Walking and Moving Around Goal Status [phone]): 0 percent impaired, limited or restricted   Akansha Wyche B. Katheren Jimmerson, PT, DPT [phone]   11/01/2012, 4:49 PM

## 2012-11-01 NOTE — Progress Notes (Signed)
Inpatient Diabetes Program Recommendations  AACE/ADA: New Consensus Statement on Inpatient Glycemic Control (2013)  Target Ranges:  Prepandial:   less than 140 mg/dL      Peak postprandial:   less than 180 mg/dL (1-2 hours)      Critically ill patients:  140 - 180 mg/dL     Results for MILY, MALECKI (MRN [id number]) as of 11/01/2012 11:43  Ref. Range 11/01/2012 07:38 11/01/2012 11:16  Glucose-Capillary Latest Range: 70-99 mg/dL 956 (H) 387 (H)    Per records, patient takes the following insulin at home: Lantus 60 units QHS Humalog 12 units tid with meals   **Called Ward Givens, NP to discuss patient.  No basal insulin ordered for this patient this morning.  Received telephone order to start 1/2 patient's home dose of Lantus- Lantus 30 units QHS.  Will follow. Ambrose Finland RN, MSN, CDE Diabetes Coordinator Inpatient Diabetes Program Team Pager: [phone] (8a-10p)

## 2012-11-01 NOTE — Progress Notes (Signed)
Patient Name: Cathy [name] Date of Encounter: 11/01/2012    Principal Problem:   Unstable angina Active Problems:   DM (diabetes mellitus), type 2 with neurological complications   CKD (chronic kidney disease)   CAD (coronary artery disease)   Chronic systolic CHF (congestive heart failure)   NSVT (nonsustained ventricular tachycardia)   Ischemic cardiomyopathy   Hypertension   Hyperlipidemia   Statin intolerance   Hypothyroidism   Carotid artery disease   Atrial fibrillation   Allergy history, radiographic dye   Elevated LFTs    SUBJECTIVE  No chest pain or sob.  For cardiolite today.  Developed ruq tenderness last night.  CURRENT MEDS . acetaminophen      . amiodarone  200 mg Oral Daily  . aspirin EC  81 mg Oral Daily  . folic acid  500 mcg Oral Daily  . furosemide  40 mg Oral Daily  . influenza vac split quadrivalent PF  0.5 mL Intramuscular Tomorrow-1000  . insulin aspart  0-15 Units Subcutaneous TID WC  . insulin aspart  0-5 Units Subcutaneous QHS  . insulin aspart  12 Units Subcutaneous TID WC  . isosorbide mononitrate  30 mg Oral q morning - 10a  . levothyroxine  25 mcg Oral QAC breakfast  . loratadine  10 mg Oral Daily  . meclizine  25 mg Oral BID  . ramipril  2.5 mg Oral Daily  . regadenoson        OBJECTIVE  Filed Vitals:   11/01/12 0500 11/01/12 0911 11/01/12 0948 11/01/12 0950  BP: 151/56 157/58 163/52 167/58  Pulse: 53     Temp: 98.3 F (36.8 C)     TempSrc:      Resp: 18     Height:      Weight:      SpO2: 96%       Intake/Output Summary (Last 24 hours) at 11/01/12 0951 Last data filed at 11/01/12 0900  Gross per 24 hour  Intake    376 ml  Output   1451 ml  Net  -1075 ml   Filed Weights   10/31/12 0456  Weight: 197 lb (89.359 kg)    PHYSICAL EXAM  General: Pleasant, NAD. Neuro: Alert and oriented X 3. Moves all extremities spontaneously. Psych: Normal affect. HEENT:  Normal  Neck: Supple without bruits or JVD. Lungs:   Resp regular and unlabored, CTA. Heart: RRR no s3, s4, or murmurs. Abdomen: Soft, ruq tender, non-distended, BS + x 4.  Extremities: No clubbing, cyanosis or edema. DP/PT/Radials 2+ and equal bilaterally.  Accessory Clinical Findings  CBC  Recent Labs  10/31/12 0030 10/31/12 0830  WBC 10.2 7.7  NEUTROABS 6.0  --   HGB 13.8 13.2  HCT 40.7 39.6  MCV 89.6 90.2  PLT 234 214   Basic Metabolic Panel  Recent Labs  10/31/12 0030 10/31/12 0830  NA 138 141  141  K 4.0 4.3  4.3  CL 101 104  104  CO2 25 25  26   GLUCOSE 222* 179*  176*  BUN 21 17  17   CREATININE 1.04 0.94  0.97  CALCIUM 9.0 9.1  9.0   Liver Function Tests  Recent Labs  10/31/12 0830  AST 232*  ALT 140*  ALKPHOS 169*  BILITOT 0.4  PROT 6.5  ALBUMIN 3.0*   Cardiac Enzymes  Recent Labs  10/31/12 0830 10/31/12 1220 10/31/12 1905  TROPONINI <0.30 <0.30 <0.30   Thyroid Function Tests  Recent Labs  10/31/12 0830  TSH 0.629    TELE  I reviewed telemetry today Nov 28, 2012. There is normal sinus rhythm.  Seen in nuc med.  ECG  Rsr, 62, lad, poor r prog  Radiology/Studies  Dg Chest Port 1 View  10/31/2012   *RADIOLOGY REPORT*  Clinical Data: Chest pain  PORTABLE CHEST - 1 VIEW    IMPRESSION: Cardiomegaly with mild pulmonary vascular congestion.  No frank pulmonary edema or focal infiltrate identified.   Original Report Authenticated By: Rise Mu, M.D.   ASSESSMENT AND PLAN  1. [place]/CAD:  ce neg.  No recurrent c/p.  For cardiolite today.  Cont asa/nitrate.  She is statin intolerant. He was mentioned in the admission note that the patient had stopped Brilinta because she ran out of money. The admission note suggests that this was to be restarted here in the hospital. Currently I do not see an order for this. Despite the fact that she was listed as not responding to Plavix, I feel it is most prudent to use Plavix in this situation.  2.  Afib:  In sinus.  amio dose  decreased 2/2 elevated lft's.  3.  Icm/chronic systolic chf:  Volume stable.  Cont acei and po lasix. Not on a bb.  4.  DMII:  Cont current regimen.  5.  Elevated LFT's:  She reports abd tenderness since last night.  Will check u/s.  F/u lft's in am.  Signed, Nicolasa Ducking NP Patient seen and examined. I agree with the assessment and plan as detailed above. See also my additional thoughts below.   I am in agreement with the note written above. I made some modifications to it. I have listed an additional problem below:    Carotid artery disease     The patient has severe carotid disease and is followed by Dr. Arbie Cookey. He and I have talked about trying to proceed with carotid endarterectomy if and when she is completely stable. She has missed some office visits.  I will await the results of the nuclear study. We will then proceed with further evaluation of her abnormal liver function studies.  [name], MD, [facility] 11/28/12 11:12 AM

## 2012-11-01 NOTE — Care Management Note (Addendum)
    Page 1 of 2   11/08/2012     9:58:49 AM   CARE MANAGEMENT NOTE 11/08/2012  Patient:  Cathy [name], Cathy [name]   Account Number:  [id number]  Date Initiated:  11/01/2012  Documentation initiated by:  GRAVES-BIGELOW,Jennetta Flood  Subjective/Objective Assessment:   Pt admitted for Chest pain. HX  NSTEMI s/p remote CABG, multiple PCIs with most recent in 06/2012. Pt was recently on brilinta. Per pt she is in donut hole.     Action/Plan:   CM did call [facility] to see if they have Brilinta samples for pt. CM did speak to [facility] and they will give pt a 3 month supply- she will have to go by office and pick up Brilinta. Son will pick her up when ready for d/c.   Anticipated DC Date:  11/02/2012   Anticipated DC Plan:  HOME/SELF CARE      DC Planning Services  CM consult  Medication Assistance      [facility] Choice  HOME HEALTH   Choice offered to / List presented to:  C-1 Patient        HH arranged  HH-1 RN  HH-10 DISEASE MANAGEMENT  HH-2 PT  HH-6 SOCIAL WORKER      HH agency  [facility]   Status of service:  Completed, signed off Medicare Important Message given?   (If response is "NO", the following Medicare IM given date fields will be blank) Date Medicare IM given:   Date Additional Medicare IM given:    Discharge Disposition:  HOME W HOME HEALTH SERVICES  Per UR Regulation:  Reviewed for med. necessity/level of care/duration of stay  If discussed at Long Length of Stay Meetings, dates discussed:   11/04/2012  11/09/2012    Comments:  11-08-12 0955 Tomi Bamberger, RN,BSN [phone] CM did speak to pt in regards to [facility] vs SNF- Pt does not like the idea of SNF- she stated just shoot me now. CM did make her aware that in order to go home safely she needs to work with PT. CM asked RN Lelon Mast to get pt up from bed to chair today. CM will continue to monitor for disposition needs.  11-03-12 [address], [place] [phone] CM did call to  [facility] and spoke to [facility]- she will bring samples of Brilinta to pt. RN to hold medications until d/c. CM also offerred choice for [facility] services. Pt chose [facility] for services. RN for disease management/ PT  and SW for assistance with transportation to MD office visits. MD please write orders for services when pt is medically stable for d/c. CM did make referral for [facility] services with [facility] and SOC to begin within 24-48 hours of d/c.   11-01-12 [address], [place] [phone] pt will be out of the donut hole in Jan. No further needs from CM at this time.

## 2012-11-01 NOTE — Progress Notes (Signed)
ANTICOAGULATION CONSULT NOTE - Follow Up Consult  Pharmacy Consult for heparin Indication: chest pain/ACS  Labs:  Recent Labs  10/31/12 0030 10/31/12 0830 10/31/12 0949 10/31/12 1220 10/31/12 1905 11/01/12 0500  HGB 13.8 13.2  --   --   --   --   HCT 40.7 39.6  --   --   --   --   PLT 234 214  --   --   --   --   LABPROT  --  13.3  --   --   --   --   INR  --  1.03  --   --   --   --   HEPARINUNFRC  --   --  0.10*  --  0.18* 0.32  CREATININE 1.04 0.94  0.97  --   --   --   --   TROPONINI <0.30 <0.30  --  <0.30 <0.30  --     Assessment/Plan:  79yo female now therapeutic on heparin after rate increases.  Will continue gtt at current rate and confirm stable with additional level.  Vernard Gambles, PharmD, BCPS  11/01/2012,6:21 AM

## 2012-11-01 NOTE — Clinical Documentation Improvement (Signed)
THIS DOCUMENT IS NOT A PERMANENT PART OF THE MEDICAL RECORD  Please update your documentation with the medical record to reflect your response to this query. If you need help knowing how to do this please call [phone].  11/01/12   Dear Dr. Graciela Husbands,   Noted in history "systolic CHF". Please clarify in Notes and DC summary the acuity of the CHF to better describe this patient's severity of illness and risk of mortality. Thank you.  You may use possible, probable, or suspect with inpatient documentation. possible, probable, suspected diagnoses MUST be documented at the time of discharge  Reviewed: additional documentation in the medical record  Thank You,  Beverley Fiedler RN BSN Clinical Documentation Specialist: [phone] Health Information Management [place]

## 2012-11-01 NOTE — Telephone Encounter (Signed)
care manger at the hospital called for samples of brinilta 90 mg, samples were placed at the front desk

## 2012-11-02 ENCOUNTER — Encounter (HOSPITAL_COMMUNITY): Payer: Self-pay | Admitting: Internal Medicine

## 2012-11-02 ENCOUNTER — Inpatient Hospital Stay (HOSPITAL_COMMUNITY): Payer: Medicare Other

## 2012-11-02 DIAGNOSIS — Z9229 Personal history of other drug therapy: Secondary | ICD-10-CM

## 2012-11-02 DIAGNOSIS — N390 Urinary tract infection, site not specified: Secondary | ICD-10-CM | POA: Diagnosis present

## 2012-11-02 DIAGNOSIS — R7989 Other specified abnormal findings of blood chemistry: Secondary | ICD-10-CM

## 2012-11-02 DIAGNOSIS — K8020 Calculus of gallbladder without cholecystitis without obstruction: Secondary | ICD-10-CM

## 2012-11-02 LAB — URINE CULTURE

## 2012-11-02 LAB — COMPREHENSIVE METABOLIC PANEL
ALT: 60 U/L — ABNORMAL HIGH (ref 0–35)
AST: 19 U/L (ref 0–37)
Alkaline Phosphatase: 142 U/L — ABNORMAL HIGH (ref 39–117)
BUN: 22 mg/dL (ref 6–23)
CO2: 27 mEq/L (ref 19–32)
Calcium: 9.4 mg/dL (ref 8.4–10.5)
Creatinine, Ser: 1.03 mg/dL (ref 0.50–1.10)
GFR calc Af Amer: 58 mL/min — ABNORMAL LOW (ref >90)
GFR calc non Af Amer: 50 mL/min — ABNORMAL LOW (ref >90)
Glucose, Bld: 386 mg/dL — ABNORMAL HIGH (ref 70–99)
Potassium: 4.1 mEq/L (ref 3.5–5.1)
Sodium: 137 mEq/L (ref 135–145)
Total Protein: 6.8 g/dL (ref 6.0–8.3)

## 2012-11-02 LAB — GLUCOSE, CAPILLARY
Glucose-Capillary: 321 mg/dL — ABNORMAL HIGH (ref 70–99)
Glucose-Capillary: 267 mg/dL — ABNORMAL HIGH (ref 70–99)
Glucose-Capillary: 200 mg/dL — ABNORMAL HIGH (ref 70–99)

## 2012-11-02 MED ORDER — INSULIN GLARGINE 100 UNIT/ML ~~LOC~~ SOLN
60.0000 [IU] | Freq: Every day | SUBCUTANEOUS | Status: DC
  Administered 2012-11-02 – 2012-11-08 (×6): 60 [IU] via SUBCUTANEOUS
  Filled 2012-11-02 (×8): qty 0.6

## 2012-11-02 MED ORDER — CEPHALEXIN 500 MG PO CAPS
500.0000 mg | ORAL_CAPSULE | Freq: Two times a day (BID) | ORAL | Status: AC
  Administered 2012-11-02 – 2012-11-08 (×13): 500 mg via ORAL
  Filled 2012-11-02 (×14): qty 1

## 2012-11-02 NOTE — Progress Notes (Signed)
Physical Therapy Treatment Patient Details Name: Cathy [name] MRN: [id number] DOB: 29-Dec-1930 Today's Date: 11/02/2012 Time: 3086-5784 PT Time Calculation (min): 13 min  PT Assessment / Plan / Recommendation  History of Present Illness 79 y.o. female admitted to [facility] on 10/31/10 with [place].  Of note, pt with h/o chronic back and neck pain.     PT Comments   Pt states she is very tired today but agreeable to participate in therapy.   Able to increase ambulation distance but she does fatigue quickly.  Cont with current POC to ensure safe d/c home.     Follow Up Recommendations  Home health PT;Supervision - Intermittent     Does the patient have the potential to tolerate intense rehabilitation     Barriers to Discharge        Equipment Recommendations  None recommended by PT    Recommendations for Other Services    Frequency Min 3X/week   Progress towards PT Goals Progress towards PT goals: Progressing toward goals  Plan Current plan remains appropriate    Precautions / Restrictions Precautions Precautions: Fall Restrictions Weight Bearing Restrictions: No   Pertinent Vitals/Pain 3/4 DOE with ambulation     Mobility  Bed Mobility Bed Mobility: Supine to Sit;Sitting - Scoot to Edge of Bed;Sit to Supine Supine to Sit: 6: Modified independent (Device/Increase time);HOB flat Sitting - Scoot to Edge of Bed: 6: Modified independent (Device/Increase time) Sit to Supine: 6: Modified independent (Device/Increase time);HOB flat Transfers Transfers: Sit to Stand;Stand to Sit Sit to Stand: With upper extremity assist;From bed;From toilet;5: Supervision Stand to Sit: With upper extremity assist;To bed;To toilet;5: Supervision Details for Transfer Assistance: supervision for safety, verbal cues for safe hand placement.   Ambulation/Gait Ambulation/Gait Assistance: 4: Min guard Ambulation Distance (Feet): 140 Feet Assistive device: Rolling walker Ambulation/Gait Assistance Details:  cues to stay closer to RW, posture, & pursed lip breathing.  3/4 DOE noted as returning back to room.  Pt with fatigue & increased flexed posture with RW too far out in front of body.  Increased cues for safety Gait Pattern: Step-through pattern;Decreased stride length;Trunk flexed Stairs: No Wheelchair Mobility Wheelchair Mobility: No      PT Goals (current goals can now be found in the care plan section) Acute Rehab PT Goals PT Goal Formulation: With patient Time For Goal Achievement: 11/15/12 Potential to Achieve Goals: Good  Visit Information  Last PT Received On: 11/02/12 Assistance Needed: +1 History of Present Illness: 79 y.o. female admitted to [facility] on 10/31/10 with [place].  Of note, pt with h/o chronic back and neck pain.      Subjective Data      Cognition  Cognition Arousal/Alertness: Awake/alert Behavior During Therapy: WFL for tasks assessed/performed Overall Cognitive Status: Within Functional Limits for tasks assessed    Balance     End of Session PT - End of Session Equipment Utilized During Treatment: Gait belt Activity Tolerance: Patient tolerated treatment well;Patient limited by fatigue Patient left: in bed;with call bell/phone within reach Nurse Communication: Mobility status   GP     Lara Mulch 11/02/2012, 11:34 AM   Verdell Face, PTA [phone] 11/02/2012

## 2012-11-02 NOTE — Consult Note (Signed)
Reason for Consult: Atypical chest pain abnormal liver tests Referring Physician: [name] is an 79 y.o. female.  HPI: Patient seen at the request of Dr. Myrtis [name] for atypical chest pain elevated liver tests and gallstones and on  ultrasound her duct seems to be more dilated than on previous CT scan and she did have some midepigastric pain on Friday which was relieved by Tylenol and then she was admitted with a different pain in her chest radiating to her back on Saturday and her liver tests were increased and we were consulted for further workup and plans. Her family history is negative for any gallbladder or GI issues and since her partial colectomy and episodic diarrhea she has no other GI complaints although has chronic swallowing issues from her goiter that is not getting any worse and she had an endoscopy about 25 years ago but no other recent GI workup Past Medical History  Diagnosis Date  . Hypertension     Unspecified  . Hyperlipidemia     Mixed, statin intolerance.  [name] CAD (coronary artery disease) 08/21/09    a. CAD s/p CABG 2003. b. Cath 2006: occluded OM vein graft. c. Anterior MI 04/15/12 s/p PTCA only to ramus (stenting unsuccessful at that time) d. NSTEMI 06/2012: failed med rx, s/p DES to ramus c/b vessel perforation tx with balloon/stenting. Plavix hyporesponsive so changed to Brilinta.  . S/P CABG (coronary artery bypass graft) 2003     2003  LIMA, LAD, SVG to the OM, SVG right coronary artery  . Thoracic ascending aortic aneurysm 2003    Ascending thoracic aneurysm repair with a graft at time of CABG  . Diabetes mellitus     Insulin dependent  . Gastroparesis   . Depression   . Allergy history, radiographic dye     Contrast dye allergy  . Previous back surgery   . Syncope 2008    Question?  2008  . Chronic systolic heart failure   . Ischemic cardiomyopathy 06/2011    a. Echo 04/19/12: EF 25-30%, mod LVH, diffuse HK, trivial AI  . Hypotension     October, 2013   . Hypothyroidism     Treated with low-dose Synthroid  . CKD (chronic kidney disease)   . Carotid artery disease     a. 80-99% prox RICA stenosis - with recent cardiac event 06/2012, favor waiting at least 1 month, optimally 3 months post PCI.  [name] Hypertriglyceridemia   . Statin intolerance   . Peripheral neuropathy   . Atrial fibrillation     a. Noted during 06/2012 hospitalization. Placed on amiodarone. Plan is for event monitor to assess for breakthrough, hold off anticoag unless additional AF seen.  [name] NSVT (nonsustained ventricular tachycardia)     a. Noted during 06/2012 hospitalization.    Past Surgical History  Procedure Laterality Date  . Coronary artery bypass graft  05/05/2001     LIMA, LAD, SVG to the OM, SVG right coronary artery  . Bladder tacking    . Salivary gland surgery    . Total abdominal hysterectomy    . Back surgery    . Breast surgery    . External ear surgery    . Cardiac catheterization  04/15/12, 07/05/12    60-70% proximal LAD, 95% mid AV groove circumflex s/p PTCA, proximal RCA occlusion, patent LIMA to LAD, patent SVG to distal RCA, chronically occluded SVG to circumflex; LVEF 25-30%    Family History  Problem Relation Age of Onset  .  Heart disease Other   . Heart disease Mother   . Hypertension Mother   . Heart disease Father   . Hypertension Father     Social History:  reports that she has never smoked. She has never used smokeless tobacco. She reports that she drinks about 1.5 ounces of alcohol per week. She reports that she does not use illicit drugs.  Allergies:  Allergies  Allergen Reactions  . Atorvastatin Other (See Comments)    Unknown-patient admitted to hospital after taking  . Citrus Dermatitis    BREAK OUT ON BODY  . Contrast Media [Iodinated Diagnostic Agents] Other (See Comments)    Passed out  . Iohexol      Desc: ANAPHYLAXIS   . Plavix [Clopidogrel Bisulfate]     Not an allergy - but PRU was drawn 06/2012 indicating Plavix  hyporesponsiveness, thus she was changed to Brilinta.  . Zolpidem Tartrate     REACTION: hallucinations  . Milk-Related Compounds Rash    Medications: I have reviewed the patient's current medications.  Results for orders placed during the hospital encounter of 10/30/12 (from the past 48 hour(s))  GLUCOSE, CAPILLARY     Status: Abnormal   Collection Time    10/31/12 12:15 PM      Result Value Range   Glucose-Capillary 319 (*) 70 - 99 mg/dL  TROPONIN I     Status: None   Collection Time    10/31/12 12:20 PM      Result Value Range   Troponin I <0.30  <0.30 ng/mL   Comment:            Due to the release kinetics of cTnI,     a negative result within the first hours     of the onset of symptoms does not rule out     myocardial infarction with certainty.     If myocardial infarction is still suspected,     repeat the test at appropriate intervals.  GLUCOSE, CAPILLARY     Status: Abnormal   Collection Time    10/31/12  3:08 PM      Result Value Range   Glucose-Capillary 337 (*) 70 - 99 mg/dL  GLUCOSE, CAPILLARY     Status: Abnormal   Collection Time    10/31/12  4:42 PM      Result Value Range   Glucose-Capillary 300 (*) 70 - 99 mg/dL  TROPONIN I     Status: None   Collection Time    10/31/12  7:05 PM      Result Value Range   Troponin I <0.30  <0.30 ng/mL   Comment:            Due to the release kinetics of cTnI,     a negative result within the first hours     of the onset of symptoms does not rule out     myocardial infarction with certainty.     If myocardial infarction is still suspected,     repeat the test at appropriate intervals.  HEPARIN LEVEL (UNFRACTIONATED)     Status: Abnormal   Collection Time    10/31/12  7:05 PM      Result Value Range   Heparin Unfractionated 0.18 (*) 0.30 - 0.70 IU/mL   Comment:            IF HEPARIN RESULTS ARE BELOW     EXPECTED VALUES, AND PATIENT     DOSAGE HAS BEEN CONFIRMED,     SUGGEST  FOLLOW UP TESTING     OF  ANTITHROMBIN III LEVELS.  GLUCOSE, CAPILLARY     Status: Abnormal   Collection Time    10/31/12  7:34 PM      Result Value Range   Glucose-Capillary 240 (*) 70 - 99 mg/dL  GLUCOSE, CAPILLARY     Status: Abnormal   Collection Time    11/01/12  2:35 AM      Result Value Range   Glucose-Capillary 292 (*) 70 - 99 mg/dL  HEPARIN LEVEL (UNFRACTIONATED)     Status: None   Collection Time    11/01/12  5:00 AM      Result Value Range   Heparin Unfractionated 0.32  0.30 - 0.70 IU/mL   Comment:            IF HEPARIN RESULTS ARE BELOW     EXPECTED VALUES, AND PATIENT     DOSAGE HAS BEEN CONFIRMED,     SUGGEST FOLLOW UP TESTING     OF ANTITHROMBIN III LEVELS.  GLUCOSE, CAPILLARY     Status: Abnormal   Collection Time    11/01/12  7:38 AM      Result Value Range   Glucose-Capillary 338 (*) 70 - 99 mg/dL  GLUCOSE, CAPILLARY     Status: Abnormal   Collection Time    11/01/12 11:16 AM      Result Value Range   Glucose-Capillary 318 (*) 70 - 99 mg/dL  HEPARIN LEVEL (UNFRACTIONATED)     Status: Abnormal   Collection Time    11/01/12 11:35 AM      Result Value Range   Heparin Unfractionated 0.28 (*) 0.30 - 0.70 IU/mL   Comment:            IF HEPARIN RESULTS ARE BELOW     EXPECTED VALUES, AND PATIENT     DOSAGE HAS BEEN CONFIRMED,     SUGGEST FOLLOW UP TESTING     OF ANTITHROMBIN III LEVELS.  GLUCOSE, CAPILLARY     Status: Abnormal   Collection Time    11/01/12  4:40 PM      Result Value Range   Glucose-Capillary 301 (*) 70 - 99 mg/dL  GLUCOSE, CAPILLARY     Status: Abnormal   Collection Time    11/01/12  9:04 PM      Result Value Range   Glucose-Capillary 292 (*) 70 - 99 mg/dL   Comment 1 Notify RN    COMPREHENSIVE METABOLIC PANEL     Status: Abnormal   Collection Time    11/02/12  4:40 AM      Result Value Range   Sodium 137  135 - 145 mEq/L   Potassium 4.1  3.5 - 5.1 mEq/L   Chloride 99  96 - 112 mEq/L   CO2 27  19 - 32 mEq/L   Glucose, Bld 386 (*) 70 - 99 mg/dL   BUN 22   6 - 23 mg/dL   Creatinine, [name] 6.04  0.50 - 1.10 mg/dL   Calcium 9.4  8.4 - 54.0 mg/dL   Total Protein 6.8  6.0 - 8.3 g/dL   Albumin 3.1 (*) 3.5 - 5.2 g/dL   AST 19  0 - 37 U/L   ALT 60 (*) 0 - 35 U/L   Alkaline Phosphatase 142 (*) 39 - 117 U/L   Total Bilirubin 0.3  0.3 - 1.2 mg/dL   GFR calc non Af Amer 50 (*) >90 mL/min   GFR calc Af Amer 58 (*) >  90 mL/min   Comment: (NOTE)     The eGFR has been calculated using the CKD EPI equation.     This calculation has not been validated in all clinical situations.     eGFR's persistently <90 mL/min signify possible Chronic Kidney     Disease.  GLUCOSE, CAPILLARY     Status: Abnormal   Collection Time    11/02/12  7:54 AM      Result Value Range   Glucose-Capillary 321 (*) 70 - 99 mg/dL    US Abdomen Complete  11/01/2012   CLINICAL DATA:  Epigastric abdominal pain. Diabetes. Chronic kidney disease.  EXAM: ULTRASOUND ABDOMEN COMPLETE  COMPARISON:  None.  FINDINGS: Gallbladder  Multiple tiny gallstones and gallbladder sludge noted. No evidence of gallbladder wall thickening. No definite sonographic Murphy sign noted.  Common bile duct  Diameter: 10 mm in diameter.  Liver  Diffusely increased echogenicity of the hepatic parenchyma, consistent with hepatic steatosis. No focal mass lesion identified.  IVC  No abnormality visualized.  Pancreas  Visualized portion unremarkable.  Spleen  Size and appearance within normal limits.  Right Kidney  Length: 11.7 cm. Echogenicity within normal limits. No mass or hydronephrosis visualized.  Left Kidney  Length: 12.7 cm. Echogenicity within normal limits. Tiny cyst noted in upper pole. No mass or hydronephrosis visualized.  Abdominal aorta  No aneurysm visualized.  IMPRESSION: Cholelithiasis. No definite sonographic signs of acute cholecystitis.  Dilated common bile duct measuring 10 mm. Etiology not apparent by ultrasound. Consider nonemergent MRCP for further evaluation.  Hepatic steatosis.   Electronically Signed    By: Myles Rosenthal M.D.   On: 11/01/2012 21:48   Nm Myocar Multi W/spect W/wall Motion / Ef  11/01/2012   *RADIOLOGY REPORT*  Clinical Data:  Chest pain, history of CAD, atrial fibrillation, congestive heart failure, ischemic cardiomyopathy, diabetes, hyperlipidemia  MYOCARDIAL IMAGING WITH SPECT (REST AND PHARMACOLOGIC-STRESS) GATED LEFT VENTRICULAR WALL MOTION STUDY LEFT VENTRICULAR EJECTION FRACTION  Technique:  Standard myocardial SPECT imaging was performed after resting intravenous injection of 10 mCi Tc-69m sestamibi. Subsequently, intravenous infusion of lexiscan was performed under the supervision of the Cardiology staff.  At peak effect of the drug, 30 mCi Tc-10m sestamibi was injected intravenously and standard myocardial SPECT  imaging was performed.  Quantitative gated imaging was also performed to evaluate left ventricular wall motion, and estimate left ventricular ejection fraction.  Comparison:  Chest radiograph - 10/31/2012  Findings:  Review of the rotational raw images demonstrates significant GI activity, worse on the provided the stress images comparison to the rest.  There is no significant patient motion artifact.  A minimal amount of radiotracer is seen within the right upper extremity IV site.  SPECT imaging demonstrates mild dilatation of the left ventricular cavity.  There is a grossly matched moderate area of non perfusion involving the inferior wall of the left ventricle suggestive of prior infarction.  There are additional areas of matched non perfusion involving the apical aspect of the left ventricular septum as well as the basilar aspect of the lateral wall left ventricle, also worrisome for additional areas of prior infarction. Given the suspected areas of prior infarction, there is no definitive scintigraphic evidence of pharmacologically induced ischemia.  Quantitative gated analysis shows geographic areas of hypokinesia/akinesia involving the areas of suspected prior  infarction.  The resting left ventricular ejection fraction is 41% with end- diastolic volume of 132 ml and end-systolic volume of 78 ml.  IMPRESSION: 1.  Findings worrisome for prior infarctions  involving the inferior wall, apical aspect of the septum and basilar aspect of the lateral wall.  Given background of suspected prior infarctions, there is no definitive scintigraphic evidence of pharmacologically induced ischemia. 2.  Mildly dilated left ventricle with geographic areas of hypokinesia/akinesia involving the suspected areas of prior infarction as detailed above.  Ejection fraction - 41%.   Original Report Authenticated By: Tacey Ruiz, MD    ROS negative except above Blood pressure 128/43, pulse 73, temperature 97.9 F (36.6 C), temperature source Oral, resp. rate 18, height 5\' 5"  (1.651 m), weight 87.998 kg (194 lb), SpO2 96.00%. Physical Exam vital signs stable afebrile no acute distress currently abdomen is soft nontender good bowel sound liver tests decreased to approximately normal ultrasound and previous CT reviewed  Assessment/Plan: Multiple medical problems including questionable CBD stones Plan: We discussed MRCP and she believes she can get in the MRI scanner and will await that result to decide if anything further from a GI standpoint is needed  [facility] E 11/02/2012, 11:49 AM

## 2012-11-02 NOTE — Progress Notes (Signed)
Patient ID: Cathy [name], female   DOB: 1930/12/20, 79 y.o.   MRN: [id number]  [facility]:  SUBJECTIVE: Patient is stable today. She says that she may have had some right upper quadrant discomfort when her ultrasound was done yesterday. The ultrasound showed cholelithiasis. There was no evidence of acute cholecystitis. However her common bile duct was dilated.   Filed Vitals:   11/01/12 0954 11/01/12 1335 11/01/12 2107 11/02/12 0529  BP: 157/63 134/52 122/42 128/43  Pulse:  69 67 73  Temp:  98.2 F (36.8 C) 98.3 F (36.8 C) 97.9 F (36.6 C)  TempSrc:  Oral Oral Oral  Resp:  18 18 18   Height:      Weight:    194 lb (87.998 kg)  SpO2:  98% 95% 96%    Intake/Output Summary (Last 24 hours) at 11/02/12 0726 Last data filed at 11/01/12 1300  Gross per 24 hour  Intake    240 ml  Output    200 ml  Net     40 ml    LABS: Basic Metabolic Panel:  Recent Labs  40/98/11 0830 11/02/12 0440  NA 141  141 137  K 4.3  4.3 4.1  CL 104  104 99  CO2 25  26 27   GLUCOSE 179*  176* 386*  BUN 17  17 22   CREATININE 0.94  0.97 1.03  CALCIUM 9.1  9.0 9.4   Liver Function Tests:  Recent Labs  10/31/12 0830 11/02/12 0440  AST 232* 19  ALT 140* 60*  ALKPHOS 169* 142*  BILITOT 0.4 0.3  PROT 6.5 6.8  ALBUMIN 3.0* 3.1*   No results found for this basename: LIPASE, AMYLASE,  in the last 72 hours CBC:  Recent Labs  10/31/12 0030 10/31/12 0830  WBC 10.2 7.7  NEUTROABS 6.0  --   HGB 13.8 13.2  HCT 40.7 39.6  MCV 89.6 90.2  PLT 234 214   Cardiac Enzymes:  Recent Labs  10/31/12 0830 10/31/12 1220 10/31/12 1905  TROPONINI <0.30 <0.30 <0.30   BNP: No components found with this basename: POCBNP,  D-Dimer: No results found for this basename: DDIMER,  in the last 72 hours Hemoglobin A1C:  Recent Labs  10/31/12 0830  HGBA1C 8.3*   Fasting Lipid Panel: No results found for this basename: CHOL, HDL, LDLCALC, TRIG, CHOLHDL, LDLDIRECT,  in the last 72  hours Thyroid Function Tests:  Recent Labs  10/31/12 0830  TSH 0.629    RADIOLOGY: Ct Soft Tissue Neck Wo Contrast  10/03/2012   CLINICAL DATA:  Left ear pain.  EXAM: CT NECK WITHOUT CONTRAST  TECHNIQUE: Multidetector CT imaging of the neck was performed following the standard protocol without intravenous contrast.  COMPARISON:  05/29/2012.  FINDINGS: Examination is limited without IV contrast. No neck mass or adenopathy. Scattered lymph nodes are noted. The parotid and submandibular glands appear symmetric and normal. The tongue base and floor of the mouth are unremarkable. The epiglottis and paraglottic fat planes are normal. The piriform sinuses and vallecular air spaces are unremarkable.  Multiple bilateral thyroid nodules, left greater than right, likely multinodular goiter with substernal extension.  The visualized portion of the brain is unremarkable. Skull base is unremarkable. There are surgical changes noted involving the left temporal bone with a mastoid bowl procedure and probable prosthetic inner year ossicle. No abnormal fluid collections. The right mastoid air cells are normal. The paranasal sinuses are clear.  The lung apices are clear. There is tortuosity, ectasia and atherosclerotic change  involving the aorta surgical changes from median sternotomy are noted.  Small calcified density noted just deep to the lamina of C4 is likely a dural calcification and small meningioma.  IMPRESSION: No neck mass, adenopathy or abscess is identified.  Stable scattered lymph nodes but no pathologic adenopathy.  Multi nodular thyroid goiter, stable.  Surgical changes involving the left mastoids but no complicating features or abnormal fluid collection   Electronically Signed   By: Loralie Champagne M.D.   On: 10/03/2012 18:02   US Abdomen Complete  11/01/2012   CLINICAL DATA:  Epigastric abdominal pain. Diabetes. Chronic kidney disease.  EXAM: ULTRASOUND ABDOMEN COMPLETE  COMPARISON:  None.  FINDINGS:  Gallbladder  Multiple tiny gallstones and gallbladder sludge noted. No evidence of gallbladder wall thickening. No definite sonographic Murphy sign noted.  Common bile duct  Diameter: 10 mm in diameter.  Liver  Diffusely increased echogenicity of the hepatic parenchyma, consistent with hepatic steatosis. No focal mass lesion identified.  IVC  No abnormality visualized.  Pancreas  Visualized portion unremarkable.  Spleen  Size and appearance within normal limits.  Right Kidney  Length: 11.7 cm. Echogenicity within normal limits. No mass or hydronephrosis visualized.  Left Kidney  Length: 12.7 cm. Echogenicity within normal limits. Tiny cyst noted in upper pole. No mass or hydronephrosis visualized.  Abdominal aorta  No aneurysm visualized.  IMPRESSION: Cholelithiasis. No definite sonographic signs of acute cholecystitis.  Dilated common bile duct measuring 10 mm. Etiology not apparent by ultrasound. Consider nonemergent MRCP for further evaluation.  Hepatic steatosis.   Electronically Signed   By: Myles Rosenthal M.D.   On: 11/01/2012 21:48   Nm Myocar Multi W/spect W/wall Motion / Ef  11/01/2012   *RADIOLOGY REPORT*  Clinical Data:  Chest pain, history of CAD, atrial fibrillation, congestive heart failure, ischemic cardiomyopathy, diabetes, hyperlipidemia  MYOCARDIAL IMAGING WITH SPECT (REST AND PHARMACOLOGIC-STRESS) GATED LEFT VENTRICULAR WALL MOTION STUDY LEFT VENTRICULAR EJECTION FRACTION  Technique:  Standard myocardial SPECT imaging was performed after resting intravenous injection of 10 mCi Tc-45m sestamibi. Subsequently, intravenous infusion of lexiscan was performed under the supervision of the Cardiology staff.  At peak effect of the drug, 30 mCi Tc-56m sestamibi was injected intravenously and standard myocardial SPECT  imaging was performed.  Quantitative gated imaging was also performed to evaluate left ventricular wall motion, and estimate left ventricular ejection fraction.  Comparison:  Chest  radiograph - 10/31/2012  Findings:  Review of the rotational raw images demonstrates significant GI activity, worse on the provided the stress images comparison to the rest.  There is no significant patient motion artifact.  A minimal amount of radiotracer is seen within the right upper extremity IV site.  SPECT imaging demonstrates mild dilatation of the left ventricular cavity.  There is a grossly matched moderate area of non perfusion involving the inferior wall of the left ventricle suggestive of prior infarction.  There are additional areas of matched non perfusion involving the apical aspect of the left ventricular septum as well as the basilar aspect of the lateral wall left ventricle, also worrisome for additional areas of prior infarction. Given the suspected areas of prior infarction, there is no definitive scintigraphic evidence of pharmacologically induced ischemia.  Quantitative gated analysis shows geographic areas of hypokinesia/akinesia involving the areas of suspected prior infarction.  The resting left ventricular ejection fraction is 41% with end- diastolic volume of 132 ml and end-systolic volume of 78 ml.  IMPRESSION: 1.  Findings worrisome for prior infarctions involving the inferior  wall, apical aspect of the septum and basilar aspect of the lateral wall.  Given background of suspected prior infarctions, there is no definitive scintigraphic evidence of pharmacologically induced ischemia. 2.  Mildly dilated left ventricle with geographic areas of hypokinesia/akinesia involving the suspected areas of prior infarction as detailed above.  Ejection fraction - 41%.   Original Report Authenticated By: Tacey Ruiz, MD   Dg Chest Port 1 View  10/31/2012   *RADIOLOGY REPORT*  Clinical Data: Chest pain  PORTABLE CHEST - 1 VIEW  Comparison: Prior radiograph from 06/26/2012  Findings: Median sternotomy wires with underlying CABG markers are unchanged.  Cardiomegaly is stable.  The lungs are normally  inflated.  There is mild central pulmonary vascular congestion without frank pulmonary edema.  No focal infiltrate is identified.  No pneumothorax or pleural effusion.  Osseous structures are unchanged.  Extensive degenerative changes are noted about both shoulders.  IMPRESSION: Cardiomegaly with mild pulmonary vascular congestion.  No frank pulmonary edema or focal infiltrate identified.   Original Report Authenticated By: Rise Mu, M.D.    PHYSICAL EXAM  patient is oriented to person time and place. Affect is normal. There is no jugulovenous distention. Lungs are clear. Respiratory effort is nonlabored. Cardiac exam reveals S1 and S2. Her abdomen is soft. There may be very slight tenderness. There is no significant peripheral edema.   TELEMETRY: I have reviewed telemetry today November 02, 2012. There is normal sinus rhythm.   ASSESSMENT AND PLAN:    Unstable angina     The cardiac test this admission do not show any definite cardiac ischemia. Her nuclear scan reveals scar but no ischemia.    Hypertension     Blood pressure stable.    Hyperlipidemia     Patient is statin intolerant.    Allergy history, radiographic dye   Statin intolerance    DM (diabetes mellitus), type 2 with neurological complications    I have adjusted her Lantus.    Hypothyroidism     TSH is normal this admission    CKD (chronic kidney disease)     Renal function is stable    Carotid artery disease     Patient has severe carotid disease. We are hopeful that when her overall medical status can be stabilized, she can have an elective procedure    CAD (coronary artery disease)     Coronary disease appears to be stable at this time. Yesterday morning I thought the patient would not be able to obtain Brilinta. Therefore I decided to use Plavix she was hyporesponsive in the past. I have them learned that we have obtained Brilinta from the drug company. Therefore this is the drug she will receive.     Chronic systolic CHF (congestive heart failure)      Her volume status is stable.    Atrial fibrillation     She had atrial fibrillation when she was ill with her cardiac event previously. She's been on amiodarone. I stopped it yesterday during the evaluation of her abnormal LFTs.    NSVT (nonsustained ventricular tachycardia)     This was limited during a prior admission.    Ischemic cardiomyopathy     Ejection fraction on nuclear study yesterday was 41%. If this is reliable, this is somewhat increased from her last studies.    Elevated LFTs    LFTs yesterday were showing some improvement. The fact that they have shown a change during this admission is concerning. It argues against improvement related to  stopping her amiodarone yesterday during the day. The ultrasound revealed cholelithiasis. There was no acute obstruction. However there was dilatation of the common bile duct. Because of her overall presentation without absolute diagnosis. I will request GI evaluation before she goes home to be sure that we're not missing a significant GI problem.     Cholelithiasis   Common bile duct dilatation      I referred to cholelithiasis and common bile duct dilatation in my sentences above about elevated LFTs.    UTI (urinary tract infection)     Patient has grown greater than 100,000 Escherichia coli with her current urine culture. It is sensitive to cephalosporin. I'm starting Keflex.    H/O amiodarone therapy     I have put her amiodarone on hold for a few days. I did this because her LFTs. It may turn out that her amiodarone can be restarted.   I have spoken with Dr. Ewing Schlein of the GI team. He will be assessing her as the day goes on. The patient will not be ready for discharge today. I will not be rounding on the patient tomorrow morning. I can be reached on my cell phone by our team at any time. I know this patient extremely well. She may be ready to go home tomorrow.   [name]  11/02/2012 7:26 AM

## 2012-11-03 LAB — GLUCOSE, CAPILLARY
Glucose-Capillary: 343 mg/dL — ABNORMAL HIGH (ref 70–99)
Glucose-Capillary: 262 mg/dL — ABNORMAL HIGH (ref 70–99)
Glucose-Capillary: 272 mg/dL — ABNORMAL HIGH (ref 70–99)
Glucose-Capillary: 219 mg/dL — ABNORMAL HIGH (ref 70–99)

## 2012-11-03 MED ORDER — DOCUSATE SODIUM 100 MG PO CAPS
100.0000 mg | ORAL_CAPSULE | Freq: Once | ORAL | Status: AC
  Administered 2012-11-03: 100 mg via ORAL
  Filled 2012-11-03 (×2): qty 1

## 2012-11-03 MED ORDER — SODIUM CHLORIDE 0.9 % IV SOLN: INTRAVENOUS | Status: DC

## 2012-11-03 MED ORDER — CARVEDILOL 3.125 MG PO TABS
3.1250 mg | ORAL_TABLET | Freq: Two times a day (BID) | ORAL | Status: DC
  Administered 2012-11-03 – 2012-11-10 (×15): 3.125 mg via ORAL
  Filled 2012-11-03 (×17): qty 1

## 2012-11-03 MED ORDER — SODIUM CHLORIDE 0.9 % IV SOLN
1.5000 g | Freq: Once | INTRAVENOUS | Status: AC
  Administered 2012-11-04: 1.5 g via INTRAVENOUS
  Filled 2012-11-03: qty 1.5

## 2012-11-03 NOTE — Progress Notes (Addendum)
Patient: Cathy [name] / Admit Date: 10/30/2012 / Date of Encounter: 11/03/2012, 9:34 AM   Subjective  No CP or SOB. Low back has been hurting her today from lying in bed. No RUQ pain today.   Objective   Telemetry: NSR  Physical Exam: Blood pressure 135/74, pulse 67, temperature 98.2 F (36.8 C), temperature source Oral, resp. rate 18, height 5\' 5"  (1.651 m), weight 192 lb 4.8 oz (87.227 kg), SpO2 94.00%. General: Well developed, well nourished, in no acute distress. Head: Normocephalic, atraumatic, sclera non-icteric, no xanthomas, nares are without discharge. Poor dentition. Neck: Negative for carotid bruits. JVP not elevated. Lungs: Clear bilaterally to auscultation without wheezes, rales, or rhonchi. Breathing is unlabored. Heart: RRR S1 S2 1/6 SEM at LSB, rubs, or gallops.  Abdomen: Soft, RUQ tender but improved per pt, non-distended with normoactive bowel sounds. No rebound/guarding. GI: No suprapubic or CVA tenderness Extremities: No edema. Distal pedal pulses are 2+ and equal bilaterally. Neuro: Alert and oriented X 3.  Psych:  Responds to questions appropriately with a normal affect.  No intake or output data in the 24 hours ending 11/03/12 0934  Inpatient Medications:  . aspirin EC  81 mg Oral Daily  . cephALEXin  500 mg Oral Q12H  . folic acid  500 mcg Oral Daily  . furosemide  40 mg Oral Daily  . insulin aspart  0-15 Units Subcutaneous TID WC  . insulin aspart  0-5 Units Subcutaneous QHS  . insulin aspart  12 Units Subcutaneous TID WC  . insulin glargine  60 Units Subcutaneous QHS  . isosorbide mononitrate  30 mg Oral q morning - 10a  . levothyroxine  25 mcg Oral QAC breakfast  . loratadine  10 mg Oral Daily  . meclizine  25 mg Oral BID  . ramipril  2.5 mg Oral Daily  . Ticagrelor  90 mg Oral BID   Infusions:    Labs:  Recent Labs  11/02/12 0440  NA 137  K 4.1  CL 99  CO2 27  GLUCOSE 386*  BUN 22  CREATININE 1.03  CALCIUM 9.4    Recent Labs  11/02/12 0440  AST 19  ALT 60*  ALKPHOS 142*  BILITOT 0.3  PROT 6.8  ALBUMIN 3.1*   No results found for this basename: WBC, NEUTROABS, HGB, HCT, MCV, PLT,  in the last 72 hours  Recent Labs  10/31/12 1220 10/31/12 1905  TROPONINI <0.30 <0.30   No components found with this basename: POCBNP,  No results found for this basename: HGBA1C,  in the last 72 hours   Radiology/Studies:  US Abdomen Complete  11/01/2012   CLINICAL DATA:  Epigastric abdominal pain. Diabetes. Chronic kidney disease.  EXAM: ULTRASOUND ABDOMEN COMPLETE  COMPARISON:  None.  FINDINGS: Gallbladder  Multiple tiny gallstones and gallbladder sludge noted. No evidence of gallbladder wall thickening. No definite sonographic Murphy sign noted.  Common bile duct  Diameter: 10 mm in diameter.  Liver  Diffusely increased echogenicity of the hepatic parenchyma, consistent with hepatic steatosis. No focal mass lesion identified.  IVC  No abnormality visualized.  Pancreas  Visualized portion unremarkable.  Spleen  Size and appearance within normal limits.  Right Kidney  Length: 11.7 cm. Echogenicity within normal limits. No mass or hydronephrosis visualized.  Left Kidney  Length: 12.7 cm. Echogenicity within normal limits. Tiny cyst noted in upper pole. No mass or hydronephrosis visualized.  Abdominal aorta  No aneurysm visualized.  IMPRESSION: Cholelithiasis. No definite sonographic signs of acute cholecystitis.  Dilated common bile duct measuring 10 mm. Etiology not apparent by ultrasound. Consider nonemergent MRCP for further evaluation.  Hepatic steatosis.   Electronically Signed   By: Myles Rosenthal M.D.   On: 11/01/2012 21:48   [name]  11/03/2012   CLINICAL DATA:  Elevated liver function tests. Atypical chest pain. Biliary dilatation.  EXAM: MRI ABDOMEN WITHOUT  (INCLUDING MRCP)  TECHNIQUE: Multiplanar multisequence MR imaging of the abdomen was performed. Heavily T2-weighted images of the biliary and pancreatic  ducts were obtained, and three-dimensional MRCP images were rendered by post processing.  COMPARISON:  Multiple exams, including 11/01/2012 and 10/17/2010  FINDINGS: Diffuse hepatic steatosis.  Common bile duct dilated to 12 mm, with 10-15 small filling defects in the distal CBD compatible with stones. The largest measures 7 mm in long axis. There is borderline intrahepatic biliary dilatation.  Multiple gallstones are subtle dependent Go in the gallbladder and measure up to 1.3 cm in diameter.  Pancreas divisum noted. No dorsal pancreatic duct dilatation.  Complex 1.3 cm left kidney upper pole cyst with internal fluid-fluid level. Focal scarring posteriorly in the right mid kidney (versus highly fatty angiomyolipoma.).  Lumbar spondylosis and degenerative disc disease.  T2 hyperintense but mildly heterogeneous 1.2 cm lesion in segment 6 of the liver, image 27 of series 4, stable in size from 01/23/2009. 0.6 cm T2 hyperintense lesion in segment 4a of the liver. Dedicated 3D acquisitions are obscured by marked motion artifact.  IMPRESSION: 1. Choledocholithiasis, with over 10 small filling defects in the distal CBD compatible with stones, largest 7 mm in long axis. CBD dilated to 12 mm, with borderline intrahepatic biliary dilatation. 2. Multiple gallstones in the gallbladder. 3. Pancreas divisum without dorsal pancreatic duct dilatation. 4. Complex cyst in the left kidney upper pole, similar to 2011, with internal fluid-fluid level. 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 5. Two T2 hyperintense lesions in the liver are stable from 2011 and highly likely to be benign cysts or similar benign lesions.   Electronically Signed   By: Herbie Baltimore M.D.   On: 11/03/2012 09:23   Nm Myocar Multi W/spect W/wall Motion / Ef  11/01/2012   *RADIOLOGY REPORT*  Clinical Data:  Chest pain, history of CAD, atrial fibrillation, congestive heart failure, ischemic cardiomyopathy, diabetes,  hyperlipidemia  MYOCARDIAL IMAGING WITH SPECT (REST AND PHARMACOLOGIC-STRESS) GATED LEFT VENTRICULAR WALL MOTION STUDY LEFT VENTRICULAR EJECTION FRACTION  Technique:  Standard myocardial SPECT imaging was performed after resting intravenous injection of 10 mCi Tc-87m sestamibi. Subsequently, intravenous infusion of lexiscan was performed under the supervision of the Cardiology staff.  At peak effect of the drug, 30 mCi Tc-65m sestamibi was injected intravenously and standard myocardial SPECT  imaging was performed.  Quantitative gated imaging was also performed to evaluate left ventricular wall motion, and estimate left ventricular ejection fraction.  Comparison:  Chest radiograph - 10/31/2012  Findings:  Review of the rotational raw images demonstrates significant GI activity, worse on the provided the stress images comparison to the rest.  There is no significant patient motion artifact.  A minimal amount of radiotracer is seen within the right upper extremity IV site.  SPECT imaging demonstrates mild dilatation of the left ventricular cavity.  There is a grossly matched moderate area of non perfusion involving the inferior wall of the left ventricle suggestive of prior infarction.  There are additional areas of matched non perfusion involving the apical aspect of the left ventricular septum as well as the  basilar aspect of the lateral wall left ventricle, also worrisome for additional areas of prior infarction. Given the suspected areas of prior infarction, there is no definitive scintigraphic evidence of pharmacologically induced ischemia.  Quantitative gated analysis shows geographic areas of hypokinesia/akinesia involving the areas of suspected prior infarction.  The resting left ventricular ejection fraction is 41% with end- diastolic volume of 132 ml and end-systolic volume of 78 ml.  IMPRESSION: 1.  Findings worrisome for prior infarctions involving the inferior wall, apical aspect of the septum and basilar  aspect of the lateral wall.  Given background of suspected prior infarctions, there is no definitive scintigraphic evidence of pharmacologically induced ischemia. 2.  Mildly dilated left ventricle with geographic areas of hypokinesia/akinesia involving the suspected areas of prior infarction as detailed above.  Ejection fraction - 41%.   Original Report Authenticated By: Tacey Ruiz, MD   Dg Chest Port 1 View  10/31/2012   *RADIOLOGY REPORT*  Clinical Data: Chest pain  PORTABLE CHEST - 1 VIEW  Comparison: Prior radiograph from 06/26/2012  Findings: Median sternotomy wires with underlying CABG markers are unchanged.  Cardiomegaly is stable.  The lungs are normally inflated.  There is mild central pulmonary vascular congestion without frank pulmonary edema.  No focal infiltrate is identified.  No pneumothorax or pleural effusion.  Osseous structures are unchanged.  Extensive degenerative changes are noted about both shoulders.  IMPRESSION: Cardiomegaly with mild pulmonary vascular congestion.  No frank pulmonary edema or focal infiltrate identified.   Original Report Authenticated By: Rise Mu, M.D.     Assessment and Plan  Ms. Wareing is an 79 year old female with known CAD. She presented to ED 3 days ago with CP, MI was R/O. Lexiscan w/o ischemia. Developed RUQ tenderness, MRCP showed gallstones and CBD dilation.  1. Chest pain with prior history of CAD: Stable. Nuclear scan with scar but no ischemia. Suspect chest pain was related to gallbladder disease. R/o for MI.  No CP, SOB overnight. She has been restarted on Brilinta after having stopped it 2/2 cost. Given 3 month samples, then will be out of doughnut hole come January. Continue ASA, Imdur. H/o statin intolerance. She used to be on Coreg 25mg  BID but this fell off her home med list - she cannot tell me why and I don't have documentation of any reason why this should've been stopped. Restart at 3.125mg  BID.  2. ICM/chronic systolic CHF:  EF stable at 41% by nuc (previously 40% in 06/2012). Volume stable. No new areas of ischemia. Continue with lasix, imdur. Has been started on ramipril 2.5 in hospital. See above regarding BB.  3. Cholelithiasis/CBD dilation: MRCP yest showed over 10 small filling defects in the distal CBD compatible with stones, largest 7 mm in long axis. CBD dilated to 12 mm, with borderline intrahepatic biliary dilatation. Multiple gallstones in the gallbladder. GI has seen her today and is planning an ERCP tomorrow late morning.   4. UTI: On Keflex Day 2/7. MRCP showed complex cyst in the left kidney upper pole, similar to 2011, with internal fluid-fluid level. 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 No CVA/suprapubic tenderness. Consider urology consult as outpatient given recurrence of UTI and findings above.  5. Carotid artery disease: Known severe RICA stenosis. Hopeful for elective CEA once current medical issues are stable.   6. H/o transient atrial fibrillation while ill in the hospital 06/2012: Was on amiodarone PTA that was stopped this admission due to elevated LFTs. It is  not clear that she will need amiodarone long term but will defer to MD. Maintaining NSR this admission.  7. DMII, uncontrolled A1C 8.6: Sliding scale Novolog. Lantus was increased last night - will hold off on further titration until we see trend of blood sugars. BG has been elevated 200-300s.   8. HTN: Well controlled on lasix, ramipril.   9. HLD: Total 188, LDL 86 on 6/14. Intolerant of statins.   10: CKD: Stage II. Stable in light of ACEI initiation. SCr 1.03 yest.   Signed, Raelyn Ensign, PA-S  Patient seen and examined and changes made as above. Dayna Dunn PA-C

## 2012-11-03 NOTE — Progress Notes (Signed)
Cathy [name] 12:10 PM  Subjective: Patient without any more GI complaints and no more pain and we discussed her MRCP extensively and discussed her case with her cardiologist  Objective: Vital signs stable afebrile no acute distress abdomen is soft nontender LFTs almost normal MRCP reviewed positive for CBD stones  Assessment: Multiple medical problems positive CBD stones  Plan: The risks benefits methods and options of ERCP  was extensively discussed including the risks of not doing it and after our discussion with her cardiologist we've elected to proceed tomorrow with further workup and plans pending those findings and we'll hold her blood thinners the rest of today and tomorrow and pending that procedure probably can resume on Friday and she also has some urinary complaints and might need urologic evaluation as well  Joshua Soulier E

## 2012-11-03 NOTE — Progress Notes (Signed)
   I have seen and examined the patient. I agree with the above note with the addition of : no cardiac chest pain with negative stress test. Going for ERCP tomorrow. Antiplatelet meds are on hold. At least Aspirin should be resumed tomorrow and Plavix once ok with GI.   Lorine Bears MD, [facility] 11/03/2012 2:53 PM

## 2012-11-04 ENCOUNTER — Inpatient Hospital Stay (HOSPITAL_COMMUNITY): Payer: Medicare Other | Admitting: Anesthesiology

## 2012-11-04 ENCOUNTER — Encounter (HOSPITAL_COMMUNITY)
Admission: EM | Disposition: A | Discharge status: Home Health | Payer: Self-pay | Source: Home / Self Care | Attending: Internal Medicine

## 2012-11-04 ENCOUNTER — Encounter (HOSPITAL_COMMUNITY): Payer: Self-pay | Admitting: *Deleted

## 2012-11-04 ENCOUNTER — Inpatient Hospital Stay (HOSPITAL_COMMUNITY): Payer: Medicare Other

## 2012-11-04 HISTORY — PX: ERCP: SHX5425

## 2012-11-04 LAB — GLUCOSE, CAPILLARY
Glucose-Capillary: 231 mg/dL — ABNORMAL HIGH (ref 70–99)
Glucose-Capillary: 250 mg/dL — ABNORMAL HIGH (ref 70–99)

## 2012-11-04 LAB — COMPREHENSIVE METABOLIC PANEL
ALT: 38 U/L — ABNORMAL HIGH (ref 0–35)
AST: 27 U/L (ref 0–37)
Calcium: 10 mg/dL (ref 8.4–10.5)
Chloride: 98 mEq/L (ref 96–112)
GFR calc Af Amer: 67 mL/min — ABNORMAL LOW (ref >90)
Sodium: 136 mEq/L (ref 135–145)
Total Protein: 7.7 g/dL (ref 6.0–8.3)

## 2012-11-04 SURGERY — ERCP, WITH INTERVENTION IF INDICATED
Anesthesia: General

## 2012-11-04 MED ORDER — ASPIRIN 81 MG PO CHEW: 81.0000 mg | CHEWABLE_TABLET | Freq: Every day | ORAL | Status: DC

## 2012-11-04 MED ORDER — LIDOCAINE HCL (CARDIAC) 20 MG/ML IV SOLN
INTRAVENOUS | Status: DC | PRN
  Administered 2012-11-04: 80 mg via INTRAVENOUS

## 2012-11-04 MED ORDER — LACTATED RINGERS IV SOLN
INTRAVENOUS | Status: DC
  Administered 2012-11-04 – 2012-11-05 (×2): via INTRAVENOUS

## 2012-11-04 MED ORDER — HYDROMORPHONE HCL PF 1 MG/ML IJ SOLN
1.0000 mg | INTRAMUSCULAR | Status: DC | PRN
  Administered 2012-11-04 – 2012-11-09 (×7): 1 mg via INTRAVENOUS
  Filled 2012-11-04 (×7): qty 1

## 2012-11-04 MED ORDER — FENTANYL CITRATE 0.05 MG/ML IJ SOLN
INTRAMUSCULAR | Status: DC | PRN
  Administered 2012-11-04: 100 ug via INTRAVENOUS

## 2012-11-04 MED ORDER — DIPHENHYDRAMINE HCL 50 MG/ML IJ SOLN
INTRAMUSCULAR | Status: DC | PRN
  Administered 2012-11-04: 12.5 mg via INTRAVENOUS

## 2012-11-04 MED ORDER — LACTATED RINGERS IV SOLN
INTRAVENOUS | Status: DC | PRN
  Administered 2012-11-04: 14:00:00 via INTRAVENOUS

## 2012-11-04 MED ORDER — NEOSTIGMINE METHYLSULFATE 1 MG/ML IJ SOLN
INTRAMUSCULAR | Status: DC | PRN
  Administered 2012-11-04: 3 mg via INTRAVENOUS

## 2012-11-04 MED ORDER — GLYCOPYRROLATE 0.2 MG/ML IJ SOLN
INTRAMUSCULAR | Status: DC | PRN
  Administered 2012-11-04: 0.4 mg via INTRAVENOUS

## 2012-11-04 MED ORDER — ROCURONIUM BROMIDE 100 MG/10ML IV SOLN
INTRAVENOUS | Status: DC | PRN
  Administered 2012-11-04: 30 mg via INTRAVENOUS

## 2012-11-04 MED ORDER — PROPOFOL 10 MG/ML IV BOLUS
INTRAVENOUS | Status: DC | PRN
  Administered 2012-11-04: 130 mg via INTRAVENOUS

## 2012-11-04 MED ORDER — AMIODARONE HCL 200 MG PO TABS
200.0000 mg | ORAL_TABLET | Freq: Every day | ORAL | Status: DC
  Administered 2012-11-05 – 2012-11-10 (×6): 200 mg via ORAL
  Filled 2012-11-04 (×7): qty 1

## 2012-11-04 MED ORDER — SODIUM CHLORIDE 0.9 % IV SOLN
INTRAVENOUS | Status: DC | PRN
  Administered 2012-11-04: 14:00:00

## 2012-11-04 MED ORDER — ARTIFICIAL TEARS OP OINT
TOPICAL_OINTMENT | OPHTHALMIC | Status: DC | PRN
  Administered 2012-11-04: 1 via OPHTHALMIC

## 2012-11-04 MED ORDER — ONDANSETRON HCL 4 MG/2ML IJ SOLN
INTRAMUSCULAR | Status: DC | PRN
  Administered 2012-11-04 (×2): 4 mg via INTRAMUSCULAR

## 2012-11-04 NOTE — OR Nursing (Signed)
OR nursing note regarding positioning written by A. Festus Barren, RN, OR [facility].

## 2012-11-04 NOTE — Anesthesia Postprocedure Evaluation (Signed)
Anesthesia Post Note  Patient: Cathy [name]  Procedure(s) Performed: Procedure(s) (LRB): ENDOSCOPIC RETROGRADE CHOLANGIOPANCREATOGRAPHY (ERCP) (N/A)  Anesthesia type: general  Patient location: PACU  Post pain: Pain level controlled  Post assessment: Patient's Cardiovascular Status Stable  Last Vitals:  Filed Vitals:   11/04/12 1100  BP: 124/61  Pulse: 76  Temp: 36.7 C  Resp: 18    Post vital signs: Reviewed and stable  Level of consciousness: sedated  Complications: No apparent anesthesia complications

## 2012-11-04 NOTE — Anesthesia Procedure Notes (Signed)
Procedure Name: Intubation Date/Time: 11/04/2012 1:38 PM Performed by: Orvilla Fus A Pre-anesthesia Checklist: Patient identified, Timeout performed, Emergency Drugs available, Suction available and Patient being monitored Patient Re-evaluated:Patient Re-evaluated prior to inductionOxygen Delivery Method: Circle system utilized Preoxygenation: Pre-oxygenation with 100% oxygen Intubation Type: IV induction Ventilation: Mask ventilation without difficulty Laryngoscope Size: Mac and 3 Grade View: Grade I Tube type: Oral Tube size: 7.5 mm Number of attempts: 1 Airway Equipment and Method: Stylet Placement Confirmation: ETT inserted through vocal cords under direct vision,  breath sounds checked- equal and bilateral and positive ETCO2 Secured at: 21 cm Tube secured with: Tape Dental Injury: Teeth and Oropharynx as per pre-operative assessment

## 2012-11-04 NOTE — Progress Notes (Signed)
Inpatient Diabetes Program Recommendations  AACE/ADA: New Consensus Statement on Inpatient Glycemic Control (2013)  Target Ranges:  Prepandial:   less than 140 mg/dL      Peak postprandial:   less than 180 mg/dL (1-2 hours)      Critically ill patients:  140 - 180 mg/dL    Results for AYESHA, MARKWELL (MRN [id number]) as of 11/04/2012 10:16  Ref. Range 11/03/2012 08:05 11/03/2012 11:39 11/03/2012 16:44 11/03/2012 21:29  Glucose-Capillary Latest Range: 70-99 mg/dL 846 (H) 962 (H) 952 (H) 219 (H)    Results for MORENE, CECILIO (MRN [id number]) as of 11/04/2012 10:16  Ref. Range 11/04/2012 07:32  Glucose-Capillary Latest Range: 70-99 mg/dL 027 (H)    **MD- Please consider the following in-hospital insulin adjustments to improve CBG control:  1. Increase Lantus to 65 units QHS (currently on Lantus 60 units QHS) 2. Increase Novolog meal coverage to 14 units tid with meals (currently on Novolog 12 units tid with meals)   Will follow. Ambrose Finland RN, MSN, CDE Diabetes Coordinator Inpatient Diabetes Program Team Pager: [phone] (8a-10p)

## 2012-11-04 NOTE — Anesthesia Preprocedure Evaluation (Signed)
Anesthesia Evaluation    Reviewed: Allergy & Precautions, H&P , NPO status , Patient's Chart, lab work & pertinent test results  History of Anesthesia Complications Negative for: history of anesthetic complications  Airway       Dental   Pulmonary neg pulmonary ROS, shortness of breath,          Cardiovascular hypertension, Pt. on medications + angina + CAD, + Past MI, + Cardiac Stents, + Peripheral Vascular Disease and +CHF + dysrhythmias  Echo 04/19/12: EF 25-30%, mod LVH, diffuse HK, trivial AI   Neuro/Psych PSYCHIATRIC DISORDERS Depression negative neurological ROS     GI/Hepatic negative GI ROS, Neg liver ROS,   Endo/Other  diabetes, Insulin DependentHypothyroidism   Renal/GU Renal InsufficiencyRenal disease     Musculoskeletal   Abdominal   Peds  Hematology   Anesthesia Other Findings   Reproductive/Obstetrics                           Anesthesia Physical Anesthesia Plan  ASA: IV  Anesthesia Plan: General   Post-op Pain Management:    Induction: Intravenous  Airway Management Planned: Oral ETT  Additional Equipment:   Intra-op Plan:   Post-operative Plan: Extubation in OR  Informed Consent:   Plan Discussed with: CRNA, Anesthesiologist and Surgeon  Anesthesia Plan Comments:         Anesthesia Quick Evaluation

## 2012-11-04 NOTE — OR Nursing (Signed)
Patient turned prone after intubation with assistance of anesthetist, Magnus Ivan, RN, Primitivo Gauze, OCT, and Endo staff. Head placed on prone pillow per anesthesia. Axillary rolls utilized as well as armboard for left arm. Right arm tucked to side. Chest foam, knee foam, pillows under shins and foam used under toes as well. Safety strap placed across back of thighs. Warm blankets applied.

## 2012-11-04 NOTE — Preoperative (Signed)
Beta Blockers   Reason not to administer Beta Blockers:Not Applicable 

## 2012-11-04 NOTE — Transfer of Care (Signed)
Immediate Anesthesia Transfer of Care Note  Patient: Cathy [name]  Procedure(s) Performed: Procedure(s): ENDOSCOPIC RETROGRADE CHOLANGIOPANCREATOGRAPHY (ERCP) (N/A)  Patient Location: PACU  Anesthesia Type:General  Level of Consciousness: awake, alert , oriented and sedated  Airway & Oxygen Therapy: Patient Spontanous Breathing and Patient connected to nasal cannula oxygen  Post-op Assessment: Report given to PACU RN, Post -op Vital signs reviewed and stable and Patient moving all extremities  Post vital signs: Reviewed and stable  Complications: No apparent anesthesia complications

## 2012-11-04 NOTE — Op Note (Signed)
[facility] [address] [place], [address]   ERCP PROCEDURE REPORT  PATIENT: Cathy [name], Cathy [name].  MR# :[id number] BIRTHDATE: September 15, 1930  GENDER: Female ENDOSCOPIST: Vida Rigger, MD REFERRED BY: [name], M.D. PROCEDURE DATE:  11/04/2012 PROCEDURE:   ERCP with sphincterotomy/papillotomy and ERCP with removal of calculus/calculi ASA CLASS:    3 INDICATIONS: positive CBD stones MEDICATIONS:    per general anesthesia TOPICAL ANESTHETIC:  no  DESCRIPTION OF PROCEDURE:   After the risks benefits and alternatives of the procedure were thoroughly explained, informed consent was obtained.  The Pentax ERCP [id number]  endoscope was introduced through the mouth and advanced to the second portion of the duodenum .a bulbous ampulla was brought into view and using the triple-lumen sphincterotome loaded with the JAG Jagwire deep selective cannulation was obtained on the first attempt and there was no PD injections or wire advancements throughout the procedure and on initial cholangiogram obvious multiple stones were seen and we proceeded with a medium to large sphincterotomy in the customary fashion until we had adequate biliary drainage and could get the fully bowed sphincterotome easily in and out of the duct and then we exchanged the sphincter tone for the adjustable balloon and proceed with multiple 12 mm balloon pull-throughs and multiple stone fragments and stones were removed and then we proceeded with an occlusion cholangiogram which was negative and two more additional balloon pull-throughs  did not reveal any additional debris there was adequate biliary drainage and the patient tolerated the procedure well and the scope was removed there was no obvious immediate complication           COMPLICATIONS:none  ENDOSCOPIC IMPRESSION:1. Bulbous ampulla 2. No pancreatic duct injections or  wire advancements #3. Multiple CBD stones and debris were  removed after sphincterotomy as above #4. Negative occlusion cholangiogram with adequate biliary drainage at the end of the procedure  RECOMMENDATIONS:customary post-ERCP observation and if no delayed complications hopefully can go home tomorrow and consideration of laparoscopic cholecystectomy in the future per cardiology versus following her symptomatically and proceeding in the future  if needed and I am happy see back when necessary     _______________________________ eSigned:  Vida Rigger, MD 11/04/2012 2:36 PM   XB:[id number] D Myrtis Ser, MD

## 2012-11-04 NOTE — Progress Notes (Signed)
Cathy [name] 1:20 PM  Subjective: Patient without any new complaints ready for her procedure  Objective: Vital signs stable afebrile no acute distress exam please see pre-assessment  Assessment: CBD stones multiple medical problems  Plan: Okay for anesthesia and ERCP  [facility]

## 2012-11-04 NOTE — Progress Notes (Signed)
Patient ID: Cathy [name], female   DOB: 08-21-1930, 79 y.o.   MRN: [id number]    Patient: Cathy [name] / Admit Date: 10/30/2012 / Date of Encounter: 11/04/2012, 7:42 AM   Subjective  No chest pain or dyspnea.  No further RUQ abdominal pain.    Objective   Telemetry: NSR  Physical Exam: Blood pressure 119/51, pulse 71, temperature 98.4 F (36.9 C), temperature source Oral, resp. rate 18, height 5\' 5"  (1.651 m), weight 86.456 kg (190 lb 9.6 oz), SpO2 99.00%. General: Well developed, well nourished, in no acute distress. Head: Normocephalic, atraumatic, sclera non-icteric, no xanthomas, nares are without discharge. Poor dentition. Neck: Negative for carotid bruits. JVP not elevated. Lungs: Clear bilaterally to auscultation without wheezes, rales, or rhonchi. Breathing is unlabored. Heart: RRR S1 S2 1/6 SEM at LSB, rubs, or gallops.  Abdomen: Soft, RUQ tender but improved per pt, non-distended with normoactive bowel sounds. No rebound/guarding. GI: No suprapubic or CVA tenderness Extremities: No edema. Distal pedal pulses are 2+ and equal bilaterally. Neuro: Alert and oriented X 3.  Psych:  Responds to questions appropriately with a normal affect.  No intake or output data in the 24 hours ending 11/04/12 0742  Inpatient Medications:  . ampicillin-sulbactam (UNASYN) 1.5 g IVPB  1.5 g Intravenous Once  . carvedilol  3.125 mg Oral BID WC  . cephALEXin  500 mg Oral Q12H  . folic acid  500 mcg Oral Daily  . furosemide  40 mg Oral Daily  . insulin aspart  0-15 Units Subcutaneous TID WC  . insulin aspart  0-5 Units Subcutaneous QHS  . insulin aspart  12 Units Subcutaneous TID WC  . insulin glargine  60 Units Subcutaneous QHS  . isosorbide mononitrate  30 mg Oral q morning - 10a  . levothyroxine  25 mcg Oral QAC breakfast  . loratadine  10 mg Oral Daily  . meclizine  25 mg Oral BID  . ramipril  2.5 mg Oral Daily   Infusions:  . sodium chloride      Labs:  Recent Labs  11/02/12 0440  NA 137  K 4.1  CL 99  CO2 27  GLUCOSE 386*  BUN 22  CREATININE 1.03  CALCIUM 9.4    Recent Labs  11/02/12 0440  AST 19  ALT 60*  ALKPHOS 142*  BILITOT 0.3  PROT 6.8  ALBUMIN 3.1*   No results found for this basename: WBC, NEUTROABS, HGB, HCT, MCV, PLT,  in the last 72 hours No results found for this basename: CKTOTAL, CKMB, TROPONINI,  in the last 72 hours No components found with this basename: POCBNP,  No results found for this basename: HGBA1C,  in the last 72 hours   Radiology/Studies:  US Abdomen Complete  11/01/2012   CLINICAL DATA:  Epigastric abdominal pain. Diabetes. Chronic kidney disease.  EXAM: ULTRASOUND ABDOMEN COMPLETE  COMPARISON:  None.  FINDINGS: Gallbladder  Multiple tiny gallstones and gallbladder sludge noted. No evidence of gallbladder wall thickening. No definite sonographic Murphy sign noted.  Common bile duct  Diameter: 10 mm in diameter.  Liver  Diffusely increased echogenicity of the hepatic parenchyma, consistent with hepatic steatosis. No focal mass lesion identified.  IVC  No abnormality visualized.  Pancreas  Visualized portion unremarkable.  Spleen  Size and appearance within normal limits.  Right Kidney  Length: 11.7 cm. Echogenicity within normal limits. No mass or hydronephrosis visualized.  Left Kidney  Length: 12.7 cm. Echogenicity within normal limits. Tiny cyst noted in upper pole.  No mass or hydronephrosis visualized.  Abdominal aorta  No aneurysm visualized.  IMPRESSION: Cholelithiasis. No definite sonographic signs of acute cholecystitis.  Dilated common bile duct measuring 10 mm. Etiology not apparent by ultrasound. Consider nonemergent MRCP for further evaluation.  Hepatic steatosis.   Electronically Signed   By: Myles Rosenthal M.D.   On: 11/01/2012 21:48   [name]  11/03/2012   CLINICAL DATA:  Elevated liver function tests. Atypical chest pain. Biliary dilatation.  EXAM: MRI ABDOMEN WITHOUT  (INCLUDING MRCP)   TECHNIQUE: Multiplanar multisequence MR imaging of the abdomen was performed. Heavily T2-weighted images of the biliary and pancreatic ducts were obtained, and three-dimensional MRCP images were rendered by post processing.  COMPARISON:  Multiple exams, including 11/01/2012 and 10/17/2010  FINDINGS: Diffuse hepatic steatosis.  Common bile duct dilated to 12 mm, with 10-15 small filling defects in the distal CBD compatible with stones. The largest measures 7 mm in long axis. There is borderline intrahepatic biliary dilatation.  Multiple gallstones are subtle dependent Go in the gallbladder and measure up to 1.3 cm in diameter.  Pancreas divisum noted. No dorsal pancreatic duct dilatation.  Complex 1.3 cm left kidney upper pole cyst with internal fluid-fluid level. Focal scarring posteriorly in the right mid kidney (versus highly fatty angiomyolipoma.).  Lumbar spondylosis and degenerative disc disease.  T2 hyperintense but mildly heterogeneous 1.2 cm lesion in segment 6 of the liver, image 27 of series 4, stable in size from 01/23/2009. 0.6 cm T2 hyperintense lesion in segment 4a of the liver. Dedicated 3D acquisitions are obscured by marked motion artifact.  IMPRESSION: 1. Choledocholithiasis, with over 10 small filling defects in the distal CBD compatible with stones, largest 7 mm in long axis. CBD dilated to 12 mm, with borderline intrahepatic biliary dilatation. 2. Multiple gallstones in the gallbladder. 3. Pancreas divisum without dorsal pancreatic duct dilatation. 4. Complex cyst in the left kidney upper pole, similar to 2011, with internal fluid-fluid level. 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 5. Two T2 hyperintense lesions in the liver are stable from 2011 and highly likely to be benign cysts or similar benign lesions.   Electronically Signed   By: Herbie Baltimore M.D.   On: 11/03/2012 09:23   Nm Myocar Multi W/spect W/wall Motion / Ef  11/01/2012   *RADIOLOGY  REPORT*  Clinical Data:  Chest pain, history of CAD, atrial fibrillation, congestive heart failure, ischemic cardiomyopathy, diabetes, hyperlipidemia  MYOCARDIAL IMAGING WITH SPECT (REST AND PHARMACOLOGIC-STRESS) GATED LEFT VENTRICULAR WALL MOTION STUDY LEFT VENTRICULAR EJECTION FRACTION  Technique:  Standard myocardial SPECT imaging was performed after resting intravenous injection of 10 mCi Tc-59m sestamibi. Subsequently, intravenous infusion of lexiscan was performed under the supervision of the Cardiology staff.  At peak effect of the drug, 30 mCi Tc-65m sestamibi was injected intravenously and standard myocardial SPECT  imaging was performed.  Quantitative gated imaging was also performed to evaluate left ventricular wall motion, and estimate left ventricular ejection fraction.  Comparison:  Chest radiograph - 10/31/2012  Findings:  Review of the rotational raw images demonstrates significant GI activity, worse on the provided the stress images comparison to the rest.  There is no significant patient motion artifact.  A minimal amount of radiotracer is seen within the right upper extremity IV site.  SPECT imaging demonstrates mild dilatation of the left ventricular cavity.  There is a grossly matched moderate area of non perfusion involving the inferior wall of the left ventricle suggestive of prior  infarction.  There are additional areas of matched non perfusion involving the apical aspect of the left ventricular septum as well as the basilar aspect of the lateral wall left ventricle, also worrisome for additional areas of prior infarction. Given the suspected areas of prior infarction, there is no definitive scintigraphic evidence of pharmacologically induced ischemia.  Quantitative gated analysis shows geographic areas of hypokinesia/akinesia involving the areas of suspected prior infarction.  The resting left ventricular ejection fraction is 41% with end- diastolic volume of 132 ml and end-systolic volume  of 78 ml.  IMPRESSION: 1.  Findings worrisome for prior infarctions involving the inferior wall, apical aspect of the septum and basilar aspect of the lateral wall.  Given background of suspected prior infarctions, there is no definitive scintigraphic evidence of pharmacologically induced ischemia. 2.  Mildly dilated left ventricle with geographic areas of hypokinesia/akinesia involving the suspected areas of prior infarction as detailed above.  Ejection fraction - 41%.   Original Report Authenticated By: Tacey Ruiz, MD   Dg Chest Port 1 View  10/31/2012   *RADIOLOGY REPORT*  Clinical Data: Chest pain  PORTABLE CHEST - 1 VIEW  Comparison: Prior radiograph from 06/26/2012  Findings: Median sternotomy wires with underlying CABG markers are unchanged.  Cardiomegaly is stable.  The lungs are normally inflated.  There is mild central pulmonary vascular congestion without frank pulmonary edema.  No focal infiltrate is identified.  No pneumothorax or pleural effusion.  Osseous structures are unchanged.  Extensive degenerative changes are noted about both shoulders.  IMPRESSION: Cardiomegaly with mild pulmonary vascular congestion.  No frank pulmonary edema or focal infiltrate identified.   Original Report Authenticated By: Rise Mu, M.D.     Assessment and Plan  Ms. Chui is an 79 year old female with known CAD. She presented to ED 3 days ago with CP, MI was R/O. Lexiscan w/o ischemia. Developed RUQ tenderness, MRCP showed gallstones and CBD dilation.  1. Chest pain with prior history of CAD: Stable. Nuclear scan with scar but no ischemia. Suspect chest pain was related to gallbladder disease. R/o for MI.  No CP, SOB overnight. She has been restarted on Brilinta after having stopped it 2/2 cost. Given 3 month samples, then will be out of doughnut hole come January. Continue ASA, Imdur. H/o statin intolerance. She used to be on Coreg 25mg  BID but this fell off her home med list - she cannot tell me  why and I don't have documentation of any reason why this should've been stopped. Restarted at 3.125mg  BID.  We have held Brilinta and ASA for ERCP today, should be able to restart tomorrow.   2. ICM/chronic systolic CHF: EF stable at 41% by nuc (previously 40% in 06/2012). Volume stable. No new areas of ischemia. Continue with lasix, imdur. Has been started on ramipril 2.5 in hospital. See above regarding BB.  3. Cholelithiasis/CBD dilation: MRCP yest showed over 10 small filling defects in the distal CBD compatible with stones, largest 7 mm in long axis. CBD dilated to 12 mm, with borderline intrahepatic biliary dilatation. Multiple gallstones in the gallbladder. Plan for ERCP tomorrow.  4. UTI: On Keflex Day 2/7. MRCP showed complex cyst in the left kidney upper pole, similar to 2011, with internal fluid-fluid level. 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 No CVA/suprapubic tenderness. Consider urology consult as outpatient given recurrence of UTI and findings above.  5. Carotid artery disease: Known severe RICA stenosis. Hopeful for elective CEA once current medical issues  are stable.   6. H/o transient atrial fibrillation while ill in the hospital 06/2012: Was on amiodarone PTA that was stopped this admission due to elevated LFTs.  7. DMII, uncontrolled A1C 8.6: Sliding scale Novolog. Lantus has been increased.  8. HTN: Well controlled on lasix, ramipril.   9. HLD: Total 188, LDL 86 on 6/14. Intolerant of statins.   10: CKD: Stage II. Stable.  Marca Ancona 11/04/2012

## 2012-11-04 NOTE — Progress Notes (Signed)
Patient ID: Cathy [name], female   DOB: [birth year], 79 y.o.   MRN: [id number]    AMIODARONE:  I had stopped amiodarone when LFTs were noted to be elevated. It is now clear that the LFT abnormalities were related to common duct stones. I have restarted amiodarone. She should go home on 200mg  daily. I have ordered LFTs for the AM. (not sure what they will be after ERCP).  Jerral Bonito, MD  19:05

## 2012-11-05 DIAGNOSIS — I2510 Atherosclerotic heart disease of native coronary artery without angina pectoris: Secondary | ICD-10-CM

## 2012-11-05 LAB — COMPREHENSIVE METABOLIC PANEL
ALT: 141 U/L — ABNORMAL HIGH (ref 0–35)
AST: 272 U/L — ABNORMAL HIGH (ref 0–37)
Alkaline Phosphatase: 196 U/L — ABNORMAL HIGH (ref 39–117)
CO2: 27 mEq/L (ref 19–32)
GFR calc Af Amer: 69 mL/min — ABNORMAL LOW (ref >90)
Glucose, Bld: 257 mg/dL — ABNORMAL HIGH (ref 70–99)
Potassium: 4.1 mEq/L (ref 3.5–5.1)
Sodium: 137 mEq/L (ref 135–145)
Total Protein: 7 g/dL (ref 6.0–8.3)

## 2012-11-05 LAB — CBC WITH DIFFERENTIAL/PLATELET
Basophils Absolute: 0 10*3/uL (ref 0.0–0.1)
Basophils Relative: 0 % (ref 0–1)
Eosinophils Absolute: 0 10*3/uL (ref 0.0–0.7)
Eosinophils Relative: 0 % (ref 0–5)
HCT: 43.8 % (ref 36.0–46.0)
MCH: 30.9 pg (ref 26.0–34.0)
MCHC: 34.2 g/dL (ref 30.0–36.0)
Monocytes Absolute: 1.1 10*3/uL — ABNORMAL HIGH (ref 0.1–1.0)
Monocytes Relative: 8 % (ref 3–12)
Neutro Abs: 9.4 10*3/uL — ABNORMAL HIGH (ref 1.7–7.7)
Platelets: 245 10*3/uL (ref 150–400)
RDW: 14.8 % (ref 11.5–15.5)

## 2012-11-05 LAB — LIPASE, BLOOD: Lipase: 17 U/L (ref 11–59)

## 2012-11-05 LAB — GLUCOSE, CAPILLARY
Glucose-Capillary: 229 mg/dL — ABNORMAL HIGH (ref 70–99)
Glucose-Capillary: 149 mg/dL — ABNORMAL HIGH (ref 70–99)
Glucose-Capillary: 205 mg/dL — ABNORMAL HIGH (ref 70–99)
Glucose-Capillary: 249 mg/dL — ABNORMAL HIGH (ref 70–99)

## 2012-11-05 NOTE — Progress Notes (Signed)
Subjective:  "Worst night I have ever had!"  Had epigastric pain radiating through to her back. Had nausea and vomiting. No chest pain or angina. Her LFTs have jumped up. Bilirubin 3.4.   Objective:  Vital Signs in the last 24 hours: Temp:  [97.8 F (36.6 C)-99 F (37.2 C)] 98.1 F (36.7 C) (10/17 0422) Pulse Rate:  [60-79] 65 (10/17 0422) Resp:  [16-22] 19 (10/17 0422) BP: (124-159)/(47-87) 158/87 mmHg (10/17 0422) SpO2:  [95 %-100 %] 98 % (10/17 0422)  Intake/Output from previous day: 10/16 0701 - 10/17 0700 In: 940 [P.O.:240; I.V.:700] Out: 200 [Urine:200] Intake/Output from this shift:    . amiodarone  200 mg Oral Daily  . carvedilol  3.125 mg Oral BID WC  . cephALEXin  500 mg Oral Q12H  . folic acid  500 mcg Oral Daily  . furosemide  40 mg Oral Daily  . insulin aspart  0-15 Units Subcutaneous TID WC  . insulin aspart  0-5 Units Subcutaneous QHS  . insulin aspart  12 Units Subcutaneous TID WC  . insulin glargine  60 Units Subcutaneous QHS  . isosorbide mononitrate  30 mg Oral q morning - 10a  . levothyroxine  25 mcg Oral QAC breakfast  . loratadine  10 mg Oral Daily  . meclizine  25 mg Oral BID  . ramipril  2.5 mg Oral Daily   . lactated ringers 50 mL/hr at 11/04/12 1301    Physical Exam: The patient appears to be in no distress.  Head and neck exam reveals that the pupils are equal and reactive.  The extraocular movements are full.  There is no scleral icterus.  Mouth and pharynx are benign.  No lymphadenopathy.  No carotid bruits.  The jugular venous pressure is normal.  Thyroid is not enlarged or tender.  Chest is clear to percussion and auscultation.  No rales or rhonchi.  Expansion of the chest is symmetrical.  Heart reveals no abnormal lift or heave.  First and second heart sounds are normal.  There is no murmur gallop rub or click.  The abdomen is soft and mild epigastric tenderness.   Bowel sounds are decreased.  There is no hepatosplenomegaly or  mass.  There are no abdominal bruits.  Extremities reveal no phlebitis or edema.  Pedal pulses are good.  There is no cyanosis or clubbing.  Neurologic exam is normal strength and no lateralizing weakness.  No sensory deficits.  Integument reveals no rash  Lab Results:  Recent Labs  11/05/12 0426  WBC 12.9*  HGB 15.0  PLT 245    Recent Labs  11/04/12 0753 11/05/12 0426  NA 136 137  K 4.4 4.1  CL 98 97  CO2 24 27  GLUCOSE 251* 257*  BUN 24* 18  CREATININE 0.91 0.89   No results found for this basename: TROPONINI, CK, MB,  in the last 72 hours Hepatic Function Panel  Recent Labs  11/05/12 0426  PROT 7.0  ALBUMIN 3.2*  AST 272*  ALT 141*  ALKPHOS 196*  BILITOT 3.4*   No results found for this basename: CHOL,  in the last 72 hours No results found for this basename: PROTIME,  in the last 72 hours  Imaging: Dg Ercp With Sphincterotomy  11/04/2012   CLINICAL DATA:  Choledocholithiasis.  EXAM: ERCP performed by Dr. Ewing Schlein.  TECHNIQUE: Multiple spot images obtained with the fluoroscopic device and submitted for interpretation post-procedure.  COMPARISON:  MRCP dated 11/02/2012  FINDINGS: Three spot films  from the ERCP are demonstrated. There are multiple stones in the common bile duct. The last radiograph demonstrates no stones in the visualized portion of the duct.  IMPRESSION: Choledocholithiasis, with stone extraction.  These images were submitted for radiologic interpretation only. Please see the procedural report for the amount of contrast and the fluoroscopy time utilized.   Electronically Signed   By: Geanie Cooley M.D.   On: 11/04/2012 14:50    Cardiac Studies: Telemetry shows NSR. Assessment/Plan:  1. S/P ERCP for common bile duct stones. 2. Worsening LFTs, nausea and vomiting, epigastric pain. 3. PAF maintaining NSR, amiodarone restarted yesterday.  Plan: Not well enough to go home today. Continue to observe in hospital. Serial CBC and LFTs. Possibly home  over weekend if she shows clear trend of improvement.  LOS: 6 days    Cassell Clement 11/05/2012, 7:35 AM

## 2012-11-05 NOTE — Progress Notes (Signed)
Utilization review completed.  

## 2012-11-05 NOTE — Progress Notes (Signed)
Cathy [name] 3:05 PM  Subjective: Patient doing much better now than she was last night and we rediscussed her procedure and she has no new complaints although she still wants to see a urologist before discharge  Objective: Vital signs stable no acute distress abdomen is soft occasional bowel sounds minimal midepigastric discomfort mild no guarding or rebound slight increased white count lipase normal increased liver tests probably due to duct edema  Assessment: Status post ERCP with some postprocedure pain and increased liver tests probably due to duct edema post procedure  Plan: May slowly advance diet when she is ready and repeat liver tests and white count tomorrow and my partner will check on her then and call us sooner when necessary  [facility]

## 2012-11-05 NOTE — Progress Notes (Signed)
Pt c/o abdominal pain and nausea. Zofran and Phenergan given with some relief. MD notified of abdominal pain. New order received will continue to monitor.

## 2012-11-05 NOTE — Progress Notes (Signed)
PT Cancellation Note  Patient Details Name: Cathy [name] MRN: [id number] DOB: 1930-12-14   Cancelled Treatment:    Reason Eval/Treat Not Completed: Patient declined, due to abdominal pain and nausea.  She did not even want to try to get to the recliner chair.  I will have someone check on her this weekend to make sure she is still mobilizing well.  I mentioned if she was weaker she may need SNF for rehab and she stated, "Oh, no, I am not going to a nursing home!"   Lurena Joiner B. Paxton Binns, PT, DPT [phone]   11/05/2012, 1:59 PM

## 2012-11-05 NOTE — Plan of Care (Signed)
Problem: Problem: Diet/Nutrition Progression Goal: ADEQUATE NUTRITION Outcome: Not Progressing Patient tolerating small amounts of clear liquids, not advancing diet at this time.

## 2012-11-06 DIAGNOSIS — R5383 Other fatigue: Secondary | ICD-10-CM

## 2012-11-06 DIAGNOSIS — R5381 Other malaise: Secondary | ICD-10-CM

## 2012-11-06 DIAGNOSIS — K831 Obstruction of bile duct: Secondary | ICD-10-CM | POA: Diagnosis present

## 2012-11-06 LAB — CBC
MCH: 30.2 pg (ref 26.0–34.0)
MCHC: 33.2 g/dL (ref 30.0–36.0)
MCV: 91.2 fL (ref 78.0–100.0)
Platelets: 221 10*3/uL (ref 150–400)
RDW: 14.9 % (ref 11.5–15.5)

## 2012-11-06 LAB — GLUCOSE, CAPILLARY
Glucose-Capillary: 248 mg/dL — ABNORMAL HIGH (ref 70–99)
Glucose-Capillary: 149 mg/dL — ABNORMAL HIGH (ref 70–99)

## 2012-11-06 LAB — COMPREHENSIVE METABOLIC PANEL
ALT: 128 U/L — ABNORMAL HIGH (ref 0–35)
AST: 99 U/L — ABNORMAL HIGH (ref 0–37)
CO2: 29 mEq/L (ref 19–32)
Calcium: 9.3 mg/dL (ref 8.4–10.5)
Creatinine, Ser: 0.93 mg/dL (ref 0.50–1.10)
GFR calc Af Amer: 66 mL/min — ABNORMAL LOW (ref >90)
GFR calc non Af Amer: 57 mL/min — ABNORMAL LOW (ref >90)
Sodium: 136 mEq/L (ref 135–145)
Total Bilirubin: 1.2 mg/dL (ref 0.3–1.2)
Total Protein: 6.7 g/dL (ref 6.0–8.3)

## 2012-11-06 MED ORDER — POLYETHYLENE GLYCOL 3350 17 G PO PACK: 17.0000 g | PACK | Freq: Every day | ORAL | Status: DC

## 2012-11-06 MED ORDER — POLYETHYLENE GLYCOL 3350 17 G PO PACK
17.0000 g | PACK | Freq: Two times a day (BID) | ORAL | Status: DC
  Administered 2012-11-06: 17 g via ORAL
  Filled 2012-11-06 (×10): qty 1

## 2012-11-06 MED ORDER — SODIUM CHLORIDE 0.9 % IV SOLN
INTRAVENOUS | Status: DC
  Administered 2012-11-06 – 2012-11-08 (×3): via INTRAVENOUS

## 2012-11-06 NOTE — Progress Notes (Signed)
Patient ID: Cathy [name], female   DOB: 04-Oct-1930, 79 y.o.   MRN: [id number] [facility] Progress Note  Cathy [name] 79 y.o. Sep 01, 1930   Subjective: Feels a lot better since having a BM recently. Denies abdominal pain now. Denies nausea. Lying in bed. Does not want to drink and no appetite.  Objective: Vital signs in last 24 hours: Filed Vitals:   11/06/12 1100  BP: 138/50  Pulse: 78  Temp: 98.8 F (37.1 C)  Resp:     Physical Exam: Gen: lethargic, no acute distress, elderly, well-nourished Abd: LLQ tenderness with guarding, otherwise nontender, soft, nondistended, +BS  Lab Results:  Recent Labs  11/05/12 0426 11/06/12 0620  NA 137 136  K 4.1 3.9  CL 97 97  CO2 27 29  GLUCOSE 257* 172*  BUN 18 13  CREATININE 0.89 0.93  CALCIUM 9.5 9.3    Recent Labs  11/05/12 0426 11/06/12 0620  AST 272* 99*  ALT 141* 128*  ALKPHOS 196* 186*  BILITOT 3.4* 1.2  PROT 7.0 6.7  ALBUMIN 3.2* 2.9*    Recent Labs  11/05/12 0426 11/06/12 0620  WBC 12.9* 11.6*  NEUTROABS 9.4*  --   HGB 15.0 13.7  HCT 43.8 41.3  MCV 90.3 91.2  PLT 245 221   No results found for this basename: LABPROT, INR,  in the last 72 hours    Assessment/Plan: 79 yo s/p ERCP for CBD stones who had abdominal pain this morning that resolved after defecation (reports first BM in 5 days). Miralax given this morning. Her pain was likely due to her constipation and not due to her recent ERCP. LFTs are improving. She does not want her diet advanced stating that she has no appetite. I encouraged her to continue to try the clear liquids and will continue IVFs at 50 cc/hr for now. Will follow.   Kobey Sides C. 11/06/2012, 12:39 PM

## 2012-11-06 NOTE — Progress Notes (Addendum)
Patient ID: Cathy [name], female   DOB: [birth year], 79 y.o.   MRN: [id number]   SUBJECTIVE:   Patient feels poorly this morning. She has vague lower abdominal pain. She says that she is so weak that she cannot get out of bed. She's not having any significant chest pain. Liver function studies are improving.   Filed Vitals:   11/05/12 1029 11/05/12 1404 11/05/12 2100 11/06/12 0500  BP: 157/71 128/75 141/50 131/48  Pulse:  73 74 85  Temp:  98.2 F (36.8 C) 99.4 F (37.4 C) 99 F (37.2 C)  TempSrc:      Resp:  18 20 20   Height:      Weight:    191 lb 9.6 oz (86.909 kg)  SpO2:  96% 94% 96%    Intake/Output Summary (Last 24 hours) at 11/06/12 0940 Last data filed at 11/06/12 0300  Gross per 24 hour  Intake    810 ml  Output    600 ml  Net    210 ml    LABS: Basic Metabolic Panel:  Recent Labs  82/95/62 0426 11/06/12 0620  NA 137 136  K 4.1 3.9  CL 97 97  CO2 27 29  GLUCOSE 257* 172*  BUN 18 13  CREATININE 0.89 0.93  CALCIUM 9.5 9.3   Liver Function Tests:  Recent Labs  11/05/12 0426 11/06/12 0620  AST 272* 99*  ALT 141* 128*  ALKPHOS 196* 186*  BILITOT 3.4* 1.2  PROT 7.0 6.7  ALBUMIN 3.2* 2.9*    Recent Labs  11/05/12 1215  LIPASE 17   CBC:  Recent Labs  11/05/12 0426 11/06/12 0620  WBC 12.9* 11.6*  NEUTROABS 9.4*  --   HGB 15.0 13.7  HCT 43.8 41.3  MCV 90.3 91.2  PLT 245 221   Cardiac Enzymes: No results found for this basename: CKTOTAL, CKMB, CKMBINDEX, TROPONINI,  in the last 72 hours BNP: No components found with this basename: POCBNP,  D-Dimer: No results found for this basename: DDIMER,  in the last 72 hours Hemoglobin A1C: No results found for this basename: HGBA1C,  in the last 72 hours Fasting Lipid Panel: No results found for this basename: CHOL, HDL, LDLCALC, TRIG, CHOLHDL, LDLDIRECT,  in the last 72 hours Thyroid Function Tests: No results found for this basename: TSH, T4TOTAL, FREET3, T3FREE, THYROIDAB,  in the last 72  hours  RADIOLOGY: US Abdomen Complete  11/01/2012   CLINICAL DATA:  Epigastric abdominal pain. Diabetes. Chronic kidney disease.  EXAM: ULTRASOUND ABDOMEN COMPLETE  COMPARISON:  None.  FINDINGS: Gallbladder  Multiple tiny gallstones and gallbladder sludge noted. No evidence of gallbladder wall thickening. No definite sonographic Murphy sign noted.  Common bile duct  Diameter: 10 mm in diameter.  Liver  Diffusely increased echogenicity of the hepatic parenchyma, consistent with hepatic steatosis. No focal mass lesion identified.  IVC  No abnormality visualized.  Pancreas  Visualized portion unremarkable.  Spleen  Size and appearance within normal limits.  Right Kidney  Length: 11.7 cm. Echogenicity within normal limits. No mass or hydronephrosis visualized.  Left Kidney  Length: 12.7 cm. Echogenicity within normal limits. Tiny cyst noted in upper pole. No mass or hydronephrosis visualized.  Abdominal aorta  No aneurysm visualized.  IMPRESSION: Cholelithiasis. No definite sonographic signs of acute cholecystitis.  Dilated common bile duct measuring 10 mm. Etiology not apparent by ultrasound. Consider nonemergent MRCP for further evaluation.  Hepatic steatosis.   Electronically Signed   By: Alver Sorrow.D.  On: 11/01/2012 21:48   [name]  11/03/2012   CLINICAL DATA:  Elevated liver function tests. Atypical chest pain. Biliary dilatation.  EXAM: MRI ABDOMEN WITHOUT  (INCLUDING MRCP)  TECHNIQUE: Multiplanar multisequence MR imaging of the abdomen was performed. Heavily T2-weighted images of the biliary and pancreatic ducts were obtained, and three-dimensional MRCP images were rendered by post processing.  COMPARISON:  Multiple exams, including 11/01/2012 and 10/17/2010  FINDINGS: Diffuse hepatic steatosis.  Common bile duct dilated to 12 mm, with 10-15 small filling defects in the distal CBD compatible with stones. The largest measures 7 mm in long axis. There is borderline intrahepatic biliary  dilatation.  Multiple gallstones are subtle dependent Go in the gallbladder and measure up to 1.3 cm in diameter.  Pancreas divisum noted. No dorsal pancreatic duct dilatation.  Complex 1.3 cm left kidney upper pole cyst with internal fluid-fluid level. Focal scarring posteriorly in the right mid kidney (versus highly fatty angiomyolipoma.).  Lumbar spondylosis and degenerative disc disease.  T2 hyperintense but mildly heterogeneous 1.2 cm lesion in segment 6 of the liver, image 27 of series 4, stable in size from 01/23/2009. 0.6 cm T2 hyperintense lesion in segment 4a of the liver. Dedicated 3D acquisitions are obscured by marked motion artifact.  IMPRESSION: 1. Choledocholithiasis, with over 10 small filling defects in the distal CBD compatible with stones, largest 7 mm in long axis. CBD dilated to 12 mm, with borderline intrahepatic biliary dilatation. 2. Multiple gallstones in the gallbladder. 3. Pancreas divisum without dorsal pancreatic duct dilatation. 4. Complex cyst in the left kidney upper pole, similar to 2011, with internal fluid-fluid level. 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 5. Two T2 hyperintense lesions in the liver are stable from 2011 and highly likely to be benign cysts or similar benign lesions.   Electronically Signed   By: Herbie Baltimore M.D.   On: 11/03/2012 09:23   Mr 3d Recon At Scanner  11/03/2012   CLINICAL DATA:  Elevated liver function tests. Atypical chest pain. Biliary dilatation.  EXAM: MRI ABDOMEN WITHOUT  (INCLUDING MRCP)  TECHNIQUE: Multiplanar multisequence MR imaging of the abdomen was performed. Heavily T2-weighted images of the biliary and pancreatic ducts were obtained, and three-dimensional MRCP images were rendered by post processing.  COMPARISON:  Multiple exams, including 11/01/2012 and 10/17/2010  FINDINGS: Diffuse hepatic steatosis.  Common bile duct dilated to 12 mm, with 10-15 small filling defects in the distal CBD  compatible with stones. The largest measures 7 mm in long axis. There is borderline intrahepatic biliary dilatation.  Multiple gallstones are subtle dependent Go in the gallbladder and measure up to 1.3 cm in diameter.  Pancreas divisum noted. No dorsal pancreatic duct dilatation.  Complex 1.3 cm left kidney upper pole cyst with internal fluid-fluid level. Focal scarring posteriorly in the right mid kidney (versus highly fatty angiomyolipoma.).  Lumbar spondylosis and degenerative disc disease.  T2 hyperintense but mildly heterogeneous 1.2 cm lesion in segment 6 of the liver, image 27 of series 4, stable in size from 01/23/2009. 0.6 cm T2 hyperintense lesion in segment 4a of the liver. Dedicated 3D acquisitions are obscured by marked motion artifact.  IMPRESSION: 1. Choledocholithiasis, with over 10 small filling defects in the distal CBD compatible with stones, largest 7 mm in long axis. CBD dilated to 12 mm, with borderline intrahepatic biliary dilatation. 2. Multiple gallstones in the gallbladder. 3. Pancreas divisum without dorsal pancreatic duct dilatation. 4. Complex cyst in the left kidney upper  pole, similar to 2011, with internal fluid-fluid level. 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 5. Two T2 hyperintense lesions in the liver are stable from 2011 and highly likely to be benign cysts or similar benign lesions.   Electronically Signed   By: Herbie Baltimore M.D.   On: 11/03/2012 09:23   Nm Myocar Multi W/spect W/wall Motion / Ef  11/01/2012   *RADIOLOGY REPORT*  Clinical Data:  Chest pain, history of CAD, atrial fibrillation, congestive heart failure, ischemic cardiomyopathy, diabetes, hyperlipidemia  MYOCARDIAL IMAGING WITH SPECT (REST AND PHARMACOLOGIC-STRESS) GATED LEFT VENTRICULAR WALL MOTION STUDY LEFT VENTRICULAR EJECTION FRACTION  Technique:  Standard myocardial SPECT imaging was performed after resting intravenous injection of 10 mCi Tc-66m sestamibi.  Subsequently, intravenous infusion of lexiscan was performed under the supervision of the Cardiology staff.  At peak effect of the drug, 30 mCi Tc-10m sestamibi was injected intravenously and standard myocardial SPECT  imaging was performed.  Quantitative gated imaging was also performed to evaluate left ventricular wall motion, and estimate left ventricular ejection fraction.  Comparison:  Chest radiograph - 10/31/2012  Findings:  Review of the rotational raw images demonstrates significant GI activity, worse on the provided the stress images comparison to the rest.  There is no significant patient motion artifact.  A minimal amount of radiotracer is seen within the right upper extremity IV site.  SPECT imaging demonstrates mild dilatation of the left ventricular cavity.  There is a grossly matched moderate area of non perfusion involving the inferior wall of the left ventricle suggestive of prior infarction.  There are additional areas of matched non perfusion involving the apical aspect of the left ventricular septum as well as the basilar aspect of the lateral wall left ventricle, also worrisome for additional areas of prior infarction. Given the suspected areas of prior infarction, there is no definitive scintigraphic evidence of pharmacologically induced ischemia.  Quantitative gated analysis shows geographic areas of hypokinesia/akinesia involving the areas of suspected prior infarction.  The resting left ventricular ejection fraction is 41% with end- diastolic volume of 132 ml and end-systolic volume of 78 ml.  IMPRESSION: 1.  Findings worrisome for prior infarctions involving the inferior wall, apical aspect of the septum and basilar aspect of the lateral wall.  Given background of suspected prior infarctions, there is no definitive scintigraphic evidence of pharmacologically induced ischemia. 2.  Mildly dilated left ventricle with geographic areas of hypokinesia/akinesia involving the suspected areas of  prior infarction as detailed above.  Ejection fraction - 41%.   Original Report Authenticated By: Tacey Ruiz, MD   Dg Chest Port 1 View  10/31/2012   *RADIOLOGY REPORT*  Clinical Data: Chest pain  PORTABLE CHEST - 1 VIEW  Comparison: Prior radiograph from 06/26/2012  Findings: Median sternotomy wires with underlying CABG markers are unchanged.  Cardiomegaly is stable.  The lungs are normally inflated.  There is mild central pulmonary vascular congestion without frank pulmonary edema.  No focal infiltrate is identified.  No pneumothorax or pleural effusion.  Osseous structures are unchanged.  Extensive degenerative changes are noted about both shoulders.  IMPRESSION: Cardiomegaly with mild pulmonary vascular congestion.  No frank pulmonary edema or focal infiltrate identified.   Original Report Authenticated By: Rise Mu, M.D.   Dg Ercp With Sphincterotomy  11/04/2012   CLINICAL DATA:  Choledocholithiasis.  EXAM: ERCP performed by Dr. Ewing Schlein.  TECHNIQUE: Multiple spot images obtained with the fluoroscopic device and submitted for interpretation post-procedure.  COMPARISON:  MRCP dated 11/02/2012  FINDINGS: Three spot films from the ERCP are demonstrated. There are multiple stones in the common bile duct. The last radiograph demonstrates no stones in the visualized portion of the duct.  IMPRESSION: Choledocholithiasis, with stone extraction.  These images were submitted for radiologic interpretation only. Please see the procedural report for the amount of contrast and the fluoroscopy time utilized.   Electronically Signed   By: Geanie Cooley M.D.   On: 11/04/2012 14:50    PHYSICAL EXAM   Patient is lying flat in bed. She seems depressed. She says she is extremely weak. There is no jugulovenous distention. Lungs are clear. Respiratory effort is nonlabored. Cardiac exam reveals an S1 and S2. There is no significant abdominal pain on palpation. There is no significant peripheral  edema.   TELEMETRY:    I have reviewed telemetry today November 06, 2012. There is normal sinus rhythm.   ASSESSMENT AND PLAN:    Unstable angina     Her cardiac status is stable. No further cardiac workup.    Statin intoleranc    DM (diabetes mellitus), type 2 with neurological complications    Her diabetic meds have been adjusted in the hospital. Capillary glucose today is not significantly decreased. This is not causing her weakness.    Hypothyroidism      TSH checked this admission is in the normal range.    CKD (chronic kidney disease)     Renal function is stable. I have reviewed today's labs.    Carotid artery disease    Patient has severe carotid disease but this is not causing a problem today.    Chronic systolic CHF (congestive heart failure)       Volume status is stable. No change in therapy.    Atrial fibrillation    She is holding sinus rhythm. I had restarted her amiodarone. I do not believe that this is causing her fatigue today.    Ischemic cardiomyopathy      She is on appropriate medications that she can tolerate.    Elevated LFTs     She had elevated LFTs related to her common bile duct stone. This has been removed. LFTs looked much better today.    UTI (urinary tract infection)      She is receiving Keflex for her urinary tract infection.    Common bile duct (CBD) obstruction     This has been treated this admission.    Fatigue     I cannot explain her overall marked fatigue today. She does not appear to have heart failure that is unstable. All consult OT and PT to see if we can assess her more completel  Lower abdominal pain   Etiology today is not clear. There is no pain to palpation. She her urinary tract infection is being treated. I will follow this today. This is a new problem today.  As part of the patient's evaluation today spend an extended amount of time treating multiple ongoing problems in dealing with no problems.  [name]  11/06/2012 9:40 AM

## 2012-11-07 DIAGNOSIS — R509 Fever, unspecified: Secondary | ICD-10-CM

## 2012-11-07 LAB — URINALYSIS, ROUTINE W REFLEX MICROSCOPIC
Bilirubin Urine: NEGATIVE
Hgb urine dipstick: NEGATIVE
Ketones, ur: NEGATIVE mg/dL
Specific Gravity, Urine: 1.017 (ref 1.005–1.030)
pH: 5.5 (ref 5.0–8.0)

## 2012-11-07 LAB — GLUCOSE, CAPILLARY: Glucose-Capillary: 218 mg/dL — ABNORMAL HIGH (ref 70–99)

## 2012-11-07 LAB — URINE MICROSCOPIC-ADD ON

## 2012-11-07 NOTE — Progress Notes (Signed)
Pt temp this am 102.4.  Pt c/o headache - 2 tylenol PO given per prn orders.  Dr. Katha Cabal notified with new orders for blood cultures X 2 and u/a c&s.  Will continue to monitor.

## 2012-11-07 NOTE — Progress Notes (Deleted)
Patient ID: Cathy [name], female   DOB: 09/27/1930, 79 y.o.   MRN: [id number]    SUBJECTIVE:  The patient had bowel movements yesterday and felt better. I appreciate the ongoing GI help. She has developed a fever that peaked at 102 last night. Urine and blood cultures of been sent. Etiology is not clear. She is currently on Keflex for her urinary tract infection. The organism was sensitive when I started the Keflex. Her temperature is down today. She still feels very weak. She says that she's having problems starting her urine flow.   Filed Vitals:   11/06/12 2020 11/06/12 2300 11/07/12 0526 11/07/12 0650  BP: 123/43  133/41   Pulse: 78  85   Temp: 101.2 F (38.4 C) 100.4 F (38 C) 102.4 F (39.1 C) 98.4 F (36.9 C)  TempSrc: Oral Oral Oral Oral  Resp: 17  18   Height:      Weight:      SpO2: 97%  96%      Intake/Output Summary (Last 24 hours) at 11/07/12 0955 Last data filed at 11/07/12 0012  Gross per 24 hour  Intake    480 ml  Output   1777 ml  Net  -1297 ml    LABS: Basic Metabolic Panel:  Recent Labs  29/51/88 0426 11/06/12 0620  NA 137 136  K 4.1 3.9  CL 97 97  CO2 27 29  GLUCOSE 257* 172*  BUN 18 13  CREATININE 0.89 0.93  CALCIUM 9.5 9.3   Liver Function Tests:  Recent Labs  11/05/12 0426 11/06/12 0620  AST 272* 99*  ALT 141* 128*  ALKPHOS 196* 186*  BILITOT 3.4* 1.2  PROT 7.0 6.7  ALBUMIN 3.2* 2.9*    Recent Labs  11/05/12 1215  LIPASE 17   CBC:  Recent Labs  11/05/12 0426 11/06/12 0620  WBC 12.9* 11.6*  NEUTROABS 9.4*  --   HGB 15.0 13.7  HCT 43.8 41.3  MCV 90.3 91.2  PLT 245 221   Cardiac Enzymes: No results found for this basename: CKTOTAL, CKMB, CKMBINDEX, TROPONINI,  in the last 72 hours BNP: No components found with this basename: POCBNP,  D-Dimer: No results found for this basename: DDIMER,  in the last 72 hours Hemoglobin A1C: No results found for this basename: HGBA1C,  in the last 72 hours Fasting Lipid  Panel: No results found for this basename: CHOL, HDL, LDLCALC, TRIG, CHOLHDL, LDLDIRECT,  in the last 72 hours Thyroid Function Tests: No results found for this basename: TSH, T4TOTAL, FREET3, T3FREE, THYROIDAB,  in the last 72 hours  RADIOLOGY: US Abdomen Complete  11/01/2012   CLINICAL DATA:  Epigastric abdominal pain. Diabetes. Chronic kidney disease.  EXAM: ULTRASOUND ABDOMEN COMPLETE  COMPARISON:  None.  FINDINGS: Gallbladder  Multiple tiny gallstones and gallbladder sludge noted. No evidence of gallbladder wall thickening. No definite sonographic Murphy sign noted.  Common bile duct  Diameter: 10 mm in diameter.  Liver  Diffusely increased echogenicity of the hepatic parenchyma, consistent with hepatic steatosis. No focal mass lesion identified.  IVC  No abnormality visualized.  Pancreas  Visualized portion unremarkable.  Spleen  Size and appearance within normal limits.  Right Kidney  Length: 11.7 cm. Echogenicity within normal limits. No mass or hydronephrosis visualized.  Left Kidney  Length: 12.7 cm. Echogenicity within normal limits. Tiny cyst noted in upper pole. No mass or hydronephrosis visualized.  Abdominal aorta  No aneurysm visualized.  IMPRESSION: Cholelithiasis. No definite sonographic signs of acute cholecystitis.  Dilated common bile duct measuring 10 mm. Etiology not apparent by ultrasound. Consider nonemergent MRCP for further evaluation.  Hepatic steatosis.   Electronically Signed   By: Myles Rosenthal M.D.   On: 11/01/2012 21:48   [name]  11/03/2012   CLINICAL DATA:  Elevated liver function tests. Atypical chest pain. Biliary dilatation.  EXAM: MRI ABDOMEN WITHOUT  (INCLUDING MRCP)  TECHNIQUE: Multiplanar multisequence MR imaging of the abdomen was performed. Heavily T2-weighted images of the biliary and pancreatic ducts were obtained, and three-dimensional MRCP images were rendered by post processing.  COMPARISON:  Multiple exams, including 11/01/2012 and 10/17/2010   FINDINGS: Diffuse hepatic steatosis.  Common bile duct dilated to 12 mm, with 10-15 small filling defects in the distal CBD compatible with stones. The largest measures 7 mm in long axis. There is borderline intrahepatic biliary dilatation.  Multiple gallstones are subtle dependent Go in the gallbladder and measure up to 1.3 cm in diameter.  Pancreas divisum noted. No dorsal pancreatic duct dilatation.  Complex 1.3 cm left kidney upper pole cyst with internal fluid-fluid level. Focal scarring posteriorly in the right mid kidney (versus highly fatty angiomyolipoma.).  Lumbar spondylosis and degenerative disc disease.  T2 hyperintense but mildly heterogeneous 1.2 cm lesion in segment 6 of the liver, image 27 of series 4, stable in size from 01/23/2009. 0.6 cm T2 hyperintense lesion in segment 4a of the liver. Dedicated 3D acquisitions are obscured by marked motion artifact.  IMPRESSION: 1. Choledocholithiasis, with over 10 small filling defects in the distal CBD compatible with stones, largest 7 mm in long axis. CBD dilated to 12 mm, with borderline intrahepatic biliary dilatation. 2. Multiple gallstones in the gallbladder. 3. Pancreas divisum without dorsal pancreatic duct dilatation. 4. Complex cyst in the left kidney upper pole, similar to 2011, with internal fluid-fluid level. 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 5. Two T2 hyperintense lesions in the liver are stable from 2011 and highly likely to be benign cysts or similar benign lesions.   Electronically Signed   By: Herbie Baltimore M.D.   On: 11/03/2012 09:23   Mr 3d Recon At Scanner  11/03/2012   CLINICAL DATA:  Elevated liver function tests. Atypical chest pain. Biliary dilatation.  EXAM: MRI ABDOMEN WITHOUT  (INCLUDING MRCP)  TECHNIQUE: Multiplanar multisequence MR imaging of the abdomen was performed. Heavily T2-weighted images of the biliary and pancreatic ducts were obtained, and three-dimensional MRCP images were  rendered by post processing.  COMPARISON:  Multiple exams, including 11/01/2012 and 10/17/2010  FINDINGS: Diffuse hepatic steatosis.  Common bile duct dilated to 12 mm, with 10-15 small filling defects in the distal CBD compatible with stones. The largest measures 7 mm in long axis. There is borderline intrahepatic biliary dilatation.  Multiple gallstones are subtle dependent Go in the gallbladder and measure up to 1.3 cm in diameter.  Pancreas divisum noted. No dorsal pancreatic duct dilatation.  Complex 1.3 cm left kidney upper pole cyst with internal fluid-fluid level. Focal scarring posteriorly in the right mid kidney (versus highly fatty angiomyolipoma.).  Lumbar spondylosis and degenerative disc disease.  T2 hyperintense but mildly heterogeneous 1.2 cm lesion in segment 6 of the liver, image 27 of series 4, stable in size from 01/23/2009. 0.6 cm T2 hyperintense lesion in segment 4a of the liver. Dedicated 3D acquisitions are obscured by marked motion artifact.  IMPRESSION: 1. Choledocholithiasis, with over 10 small filling defects in the distal CBD compatible with stones, largest 7 mm in  long axis. CBD dilated to 12 mm, with borderline intrahepatic biliary dilatation. 2. Multiple gallstones in the gallbladder. 3. Pancreas divisum without dorsal pancreatic duct dilatation. 4. Complex cyst in the left kidney upper pole, similar to 2011, with internal fluid-fluid level. 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 5. Two T2 hyperintense lesions in the liver are stable from 2011 and highly likely to be benign cysts or similar benign lesions.   Electronically Signed   By: Herbie Baltimore M.D.   On: 11/03/2012 09:23   Nm Myocar Multi W/spect W/wall Motion / Ef  11/01/2012   *RADIOLOGY REPORT*  Clinical Data:  Chest pain, history of CAD, atrial fibrillation, congestive heart failure, ischemic cardiomyopathy, diabetes, hyperlipidemia  MYOCARDIAL IMAGING WITH SPECT (REST AND  PHARMACOLOGIC-STRESS) GATED LEFT VENTRICULAR WALL MOTION STUDY LEFT VENTRICULAR EJECTION FRACTION  Technique:  Standard myocardial SPECT imaging was performed after resting intravenous injection of 10 mCi Tc-70m sestamibi. Subsequently, intravenous infusion of lexiscan was performed under the supervision of the Cardiology staff.  At peak effect of the drug, 30 mCi Tc-35m sestamibi was injected intravenously and standard myocardial SPECT  imaging was performed.  Quantitative gated imaging was also performed to evaluate left ventricular wall motion, and estimate left ventricular ejection fraction.  Comparison:  Chest radiograph - 10/31/2012  Findings:  Review of the rotational raw images demonstrates significant GI activity, worse on the provided the stress images comparison to the rest.  There is no significant patient motion artifact.  A minimal amount of radiotracer is seen within the right upper extremity IV site.  SPECT imaging demonstrates mild dilatation of the left ventricular cavity.  There is a grossly matched moderate area of non perfusion involving the inferior wall of the left ventricle suggestive of prior infarction.  There are additional areas of matched non perfusion involving the apical aspect of the left ventricular septum as well as the basilar aspect of the lateral wall left ventricle, also worrisome for additional areas of prior infarction. Given the suspected areas of prior infarction, there is no definitive scintigraphic evidence of pharmacologically induced ischemia.  Quantitative gated analysis shows geographic areas of hypokinesia/akinesia involving the areas of suspected prior infarction.  The resting left ventricular ejection fraction is 41% with end- diastolic volume of 132 ml and end-systolic volume of 78 ml.  IMPRESSION: 1.  Findings worrisome for prior infarctions involving the inferior wall, apical aspect of the septum and basilar aspect of the lateral wall.  Given background of  suspected prior infarctions, there is no definitive scintigraphic evidence of pharmacologically induced ischemia. 2.  Mildly dilated left ventricle with geographic areas of hypokinesia/akinesia involving the suspected areas of prior infarction as detailed above.  Ejection fraction - 41%.   Original Report Authenticated By: Tacey Ruiz, MD   Dg Chest Port 1 View  10/31/2012   *RADIOLOGY REPORT*  Clinical Data: Chest pain  PORTABLE CHEST - 1 VIEW  Comparison: Prior radiograph from 06/26/2012  Findings: Median sternotomy wires with underlying CABG markers are unchanged.  Cardiomegaly is stable.  The lungs are normally inflated.  There is mild central pulmonary vascular congestion without frank pulmonary edema.  No focal infiltrate is identified.  No pneumothorax or pleural effusion.  Osseous structures are unchanged.  Extensive degenerative changes are noted about both shoulders.  IMPRESSION: Cardiomegaly with mild pulmonary vascular congestion.  No frank pulmonary edema or focal infiltrate identified.   Original Report Authenticated By: Rise Mu, M.D.   Dg Ercp With Sphincterotomy  11/04/2012  CLINICAL DATA:  Choledocholithiasis.  EXAM: ERCP performed by Dr. Ewing Schlein.  TECHNIQUE: Multiple spot images obtained with the fluoroscopic device and submitted for interpretation post-procedure.  COMPARISON:  MRCP dated 11/02/2012  FINDINGS: Three spot films from the ERCP are demonstrated. There are multiple stones in the common bile duct. The last radiograph demonstrates no stones in the visualized portion of the duct.  IMPRESSION: Choledocholithiasis, with stone extraction.  These images were submitted for radiologic interpretation only. Please see the procedural report for the amount of contrast and the fluoroscopy time utilized.   Electronically Signed   By: Geanie Cooley M.D.   On: 11/04/2012 14:50    PHYSICAL EXAM    Patient is oriented to person time and place. Affect is normal. There is no  jugulovenous distention. Lungs reveal scattered rhonchi. Cardiac exam reveals S1 and S2. Her abdomen is soft. She is no significant peripheral edema today.   TELEMETRY:    I have reviewed telemetry today November 07, 2012. There is normal sinus rhythm.   ASSESSMENT AND PLAN:    Statin intolerance    The patient cannot take a statin.    DM (diabetes mellitus), type 2 with neurological complications    CBG is reasonable this morning.    Chronic systolic CHF (congestive heart failure)      Volume status is stable.    Elevated LFTs     Her liver function studies had improved. Now that she's had recurring fever I will check them again. I do not know if this could be an issue related to her common bile duct stone removal    UTI (urinary tract infection)     The patient had an organism that was sensitive to cephalosporin. She is on Keflex. She says that she's having some urinary retention. I will look into arranging urology evaluation.     Common bile duct (CBD) obstruction      She has had common bile duct stone removed this admission. The GI team is following.    Fatigue       She continues to have significant fatigue.    Lower abdominal pain     She is not having any significant abdominal pain today.    Fever    Etiology of her fever is not clear. I will check her CBC. Cultures have been sent. She continues on Keflex for her urinary tract infection  [name] 11/07/2012 9:55 AM

## 2012-11-07 NOTE — Progress Notes (Signed)
Patient ID: Cathy [name], female   DOB: Apr 07, 1930, 79 y.o.   MRN: [id number] [facility] Gastroenterology Progress Note  JONIYA BOBERG 79 y.o. 1930-05-21   Subjective: Having diarrhea this morning. Eating and tolerating food. Temp spiked to 102.4 last night.  Objective: Vital signs: Filed Vitals:   11/07/12 0900  BP: 162/72  Pulse: 88  Temp: 98.9 F (37.2 C)  Resp: 20    Physical Exam: Gen: alert, no acute distress, obese Abd: mild periumbilical tenderness with minimal guarding, soft, nondistended, +BS, healed midline surgical scar  Lab Results:  Recent Labs  11/05/12 0426 11/06/12 0620  NA 137 136  K 4.1 3.9  CL 97 97  CO2 27 29  GLUCOSE 257* 172*  BUN 18 13  CREATININE 0.89 0.93  CALCIUM 9.5 9.3    Recent Labs  11/05/12 0426 11/06/12 0620  AST 272* 99*  ALT 141* 128*  ALKPHOS 196* 186*  BILITOT 3.4* 1.2  PROT 7.0 6.7  ALBUMIN 3.2* 2.9*    Recent Labs  11/05/12 0426 11/06/12 0620  WBC 12.9* 11.6*  NEUTROABS 9.4*  --   HGB 15.0 13.7  HCT 43.8 41.3  MCV 90.3 91.2  PLT 245 221      Assessment/Plan: 79 yo s/p ERCP for CBD stones and LFTs are normalizing. I do NOT think her fever is due to her recent ERCP. Diarrhea likely due to recent constipation and overflow incontinence. Advance diet. Will sign off. Call if questions. Dr. Fonnie Jarvis available to see this week if needed.   Kinberly Perris C. 11/07/2012, 1:36 PM

## 2012-11-07 NOTE — Progress Notes (Signed)
Late entry.  Pt had requested chicken noodle soup last pm with crackers and tolerated well with no nausea and mild abdominal pain that resolved without medication intervention.  Will continue to monitor pt.

## 2012-11-07 NOTE — Progress Notes (Signed)
PT Cancellation Note  Patient Details Name: Cathy [name] MRN: [id number] DOB: 27-Aug-1930   Cancelled Treatment:    Reason Eval/Treat Not Completed: Patient declined, no reason specified.  Pt continues to refuse therapy at this time due to overall abdominal pain and nausea.  Pt reports getting OOB to 3n1 however pt has not sat in recliner.  Educated pt again on the importance of overall mobility.  Pt continues to refuse.  Will attempt to see tomorrow.   Anasia Agro 11/07/2012, 3:16 PM   Jake Shark, PT DPT [phone]

## 2012-11-08 ENCOUNTER — Encounter (HOSPITAL_COMMUNITY): Payer: Self-pay | Admitting: Gastroenterology

## 2012-11-08 LAB — GLUCOSE, CAPILLARY
Glucose-Capillary: 226 mg/dL — ABNORMAL HIGH (ref 70–99)
Glucose-Capillary: 255 mg/dL — ABNORMAL HIGH (ref 70–99)
Glucose-Capillary: 260 mg/dL — ABNORMAL HIGH (ref 70–99)

## 2012-11-08 LAB — BASIC METABOLIC PANEL
CO2: 27 mEq/L (ref 19–32)
Calcium: 9.1 mg/dL (ref 8.4–10.5)
Chloride: 102 mEq/L (ref 96–112)
Creatinine, Ser: 0.77 mg/dL (ref 0.50–1.10)
GFR calc Af Amer: 90 mL/min — ABNORMAL LOW (ref >90)
Glucose, Bld: 236 mg/dL — ABNORMAL HIGH (ref 70–99)
Sodium: 137 mEq/L (ref 135–145)

## 2012-11-08 LAB — CBC WITH DIFFERENTIAL/PLATELET
Basophils Absolute: 0 10*3/uL (ref 0.0–0.1)
Basophils Relative: 0 % (ref 0–1)
Eosinophils Absolute: 0.1 10*3/uL (ref 0.0–0.7)
Eosinophils Relative: 1 % (ref 0–5)
Lymphocytes Relative: 27 % (ref 12–46)
MCHC: 32.1 g/dL (ref 30.0–36.0)
MCV: 93.5 fL (ref 78.0–100.0)
Monocytes Absolute: 1.1 10*3/uL — ABNORMAL HIGH (ref 0.1–1.0)
Monocytes Relative: 13 % — ABNORMAL HIGH (ref 3–12)
Neutro Abs: 5.2 10*3/uL (ref 1.7–7.7)
Platelets: 194 10*3/uL (ref 150–400)
RDW: 14.6 % (ref 11.5–15.5)
WBC: 8.9 10*3/uL (ref 4.0–10.5)

## 2012-11-08 LAB — HEPATIC FUNCTION PANEL
ALT: 41 U/L — ABNORMAL HIGH (ref 0–35)
AST: 14 U/L (ref 0–37)
Albumin: 2.3 g/dL — ABNORMAL LOW (ref 3.5–5.2)
Alkaline Phosphatase: 125 U/L — ABNORMAL HIGH (ref 39–117)
Total Bilirubin: 0.6 mg/dL (ref 0.3–1.2)

## 2012-11-08 NOTE — Progress Notes (Signed)
Pt voided 350cc urine, post void residual yielded 164cc

## 2012-11-08 NOTE — Evaluation (Signed)
Occupational Therapy Evaluation Patient Details Name: Cathy [name] MRN: [id number] DOB: March 11, 1930 Today's Date: 11/08/2012 Time: 5784-6962 OT Time Calculation (min): 18 min  OT Assessment / Plan / Recommendation History of present illness 79 y.o. female admitted to [facility] on 10/31/10 with [place].  Of note, pt with h/o chronic back and neck pain.     Clinical Impression   Pt admitted with above cardiac diagnosis and has the deficits listed below.  Pt would benefit from cont OT to increase I with basic adls so pt can d/c home safety alone.  Pt would benefit from SNF d/c since she is so weak and lives alone but if she refuses, home health services will need to be maxed out including HHOT.    OT Assessment  Patient needs continued OT Services    Follow Up Recommendations  SNF;Supervision/Assistance - 24 hour    Barriers to Discharge Decreased caregiver support pt lives alone  Equipment Recommendations  None recommended by OT    Recommendations for Other Services    Frequency  Min 2X/week    Precautions / Restrictions Precautions Precautions: Fall Restrictions Weight Bearing Restrictions: No   Pertinent Vitals/Pain Pt reported no pain.  All vitals stable.    ADL  Eating/Feeding: Performed;Set up Where Assessed - Eating/Feeding: Chair Grooming: Performed;Wash/dry face;Teeth care;Min guard Where Assessed - Grooming: Supported standing Upper Body Bathing: Simulated;Set up Where Assessed - Upper Body Bathing: Unsupported sitting Lower Body Bathing: Simulated;Minimal assistance Where Assessed - Lower Body Bathing: Supported sit to stand Upper Body Dressing: Simulated;Set up Where Assessed - Upper Body Dressing: Unsupported sitting Lower Body Dressing: Performed;Minimal assistance Where Assessed - Lower Body Dressing: Supported sit to stand Toilet Transfer: Performed;Minimal assistance Toilet Transfer Method: Surveyor, minerals: Environmental education officer and Hygiene: Performed;Minimal assistance Where Assessed - Engineer, mining and Hygiene: Standing Equipment Used: Rolling walker Transfers/Ambulation Related to ADLs: Pt walked in room only to sink since PT had just seen pt.  Pt required min guard. ADL Comments: Pt able to reach feet w/o adaptive equipment. Pt has assist at home with bathing from aid that comes 2x a week.      OT Diagnosis: Generalized weakness  OT Problem List: Decreased strength;Decreased activity tolerance;Decreased safety awareness;Decreased knowledge of use of DME or AE OT Treatment Interventions: Self-care/ADL training;Therapeutic activities   OT Goals(Current goals can be found in the care plan section) Acute Rehab OT Goals Patient Stated Goal: to get stronger, reduce pain OT Goal Formulation: With patient Time For Goal Achievement: 11/22/12 Potential to Achieve Goals: Fair ADL Goals Pt Will Perform Lower Body Bathing: with supervision;sit to/from stand Pt Will Perform Lower Body Dressing: with supervision;sit to/from stand Pt Will Perform Tub/Shower Transfer: Tub transfer;tub bench;ambulating;rolling walker Additional ADL Goal #1: Pt will complete toileting on 3:1 over commode with S  Visit Information  Last OT Received On: 11/08/12 Assistance Needed: +1 History of Present Illness: 79 y.o. female admitted to [facility] on 10/31/10 with [place].  Of note, pt with h/o chronic back and neck pain.         Prior Functioning     Home Living Family/patient expects to be discharged to:: Private residence Living Arrangements: Alone Available Help at Discharge: Family;Personal care attendant;Available PRN/intermittently Type of Home: Mobile home Home Access: Ramped entrance Home Layout: One level Home Equipment: Walker - 4 wheels;Cane - single point;Bedside commode;Tub bench Additional Comments: uses cane in home because there is not room for RW.   Prior Function  Level of  Independence: Needs assistance Gait / Transfers Assistance Needed: mod I with cane/RW ADL's / Homemaking Assistance Needed: assist for bathing, cleaning, shopping.   Communication / Swallowing Assistance Needed: none Comments: does not drive, relies on family to go to the store for her.   Communication Communication: No difficulties Dominant Hand: Right         Vision/Perception Vision - History Baseline Vision: No visual deficits Patient Visual Report: No change from baseline Vision - Assessment Vision Assessment: Vision not tested   Cognition  Cognition Arousal/Alertness: Awake/alert Behavior During Therapy: WFL for tasks assessed/performed Overall Cognitive Status: Within Functional Limits for tasks assessed    Extremity/Trunk Assessment Upper Extremity Assessment Upper Extremity Assessment: Overall WFL for tasks assessed LUE Deficits / Details: per pt report her left shoulder is her "bad" shoulder.   Lower Extremity Assessment Lower Extremity Assessment: Defer to PT evaluation Cervical / Trunk Assessment Cervical / Trunk Assessment: Kyphotic;Other exceptions Cervical / Trunk Exceptions: forward head, reports chronic back and neck pain, reports she is supposed to have surgery on her neck.       Mobility Bed Mobility Bed Mobility: Supine to Sit;Sit to Supine;Sitting - Scoot to Edge of Bed Supine to Sit: 6: Modified independent (Device/Increase time);HOB flat Sitting - Scoot to Edge of Bed: 6: Modified independent (Device/Increase time) Sit to Supine: 6: Modified independent (Device/Increase time);HOB flat Details for Bed Mobility Assistance: used railing to move around and position in the bed.   Transfers Transfers: Sit to Stand;Stand to Sit Sit to Stand: 4: Min assist;With upper extremity assist;With armrests;From chair/3-in-1 Stand to Sit: 4: Min assist;With upper extremity assist;With armrests;To chair/3-in-1 Details for Transfer Assistance: min assist to support  trunk during transitions from low recliner chair.      Exercise General Exercises - Upper Extremity Shoulder Flexion: AROM;Both;10 reps;Seated Elbow Flexion: AROM;Both;10 reps;Seated General Exercises - Lower Extremity Long Arc Quad: AROM;Both;10 reps;Seated Hip ABduction/ADduction: AROM;Both;10 reps;Seated Hip Flexion/Marching: AROM;Both;10 reps;Seated Toe Raises: AROM;Both;10 reps;Seated Heel Raises: AROM;Both;10 reps;Seated   Balance Balance Balance Assessed: Yes Static Sitting Balance Static Sitting - Balance Support: Feet supported Static Sitting - Level of Assistance: 7: Independent Static Standing Balance Static Standing - Balance Support: Bilateral upper extremity supported Static Standing - Level of Assistance: 4: Min assist Static Standing - Comment/# of Minutes: 2 at sink Dynamic Standing Balance Dynamic Standing - Balance Support: Bilateral upper extremity supported Dynamic Standing - Level of Assistance: 4: Min assist   End of Session OT - End of Session Equipment Utilized During Treatment: Rolling walker Activity Tolerance: Patient limited by fatigue Patient left: in bed;with call bell/phone within reach;with bed alarm set Nurse Communication: Mobility status  GO     Hope Budds 11/08/2012, 12:46 PM [phone]

## 2012-11-08 NOTE — Progress Notes (Signed)
Physical Therapy Treatment Patient Details Name: Cathy [name] MRN: [id number] DOB: 06-Nov-1930 Today's Date: 11/08/2012 Time: 1047-1100 PT Time Calculation (min): 13 min  PT Assessment / Plan / Recommendation  History of Present Illness 79 y.o. female admitted to [facility] on 10/31/10 with [place].  Of note, pt with h/o chronic back and neck pain.     PT Comments   Pt refused two PT treatments one on 10/17 and one on 10/19.  She has been in the bed for 3 days and it shows.  She is very deconditioned, requiring more assistance than before and not ambulating even half the distance as last week.  She is more appropriate for SNF placement now, but is adamantly refusing.  She is not safe to go home without assistance.  If she refuses placement, my only recommendation would be to maximize her HH services-HHPT/OT/aide.     Follow Up Recommendations  SNF;Other (comment) (SNF most appropriate at this time-pt adamately refusing)     Does the patient have the potential to tolerate intense rehabilitation    NA  Barriers to Discharge   Decreased caregiver support- no 24/7 assist      Equipment Recommendations  None recommended by PT    Recommendations for Other Services   None  Frequency Min 3X/week   Progress towards PT Goals Progress towards PT goals: Not progressing toward goals - comment (due to 3 days in the bed and two refusals of PT)  Plan Discharge plan needs to be updated    Precautions / Restrictions Precautions Precautions: Fall   Pertinent Vitals/Pain O2 sats 97%, HR 71 bpm despite DOE 2/4 with mobility.     Mobility  Bed Mobility Bed Mobility: Not assessed (pt OOB in chair) Transfers Transfers: Sit to Stand;Stand to Sit Sit to Stand: 4: Min assist;With upper extremity assist;With armrests;From chair/3-in-1 Stand to Sit: 4: Min assist;With upper extremity assist;With armrests;To chair/3-in-1 Details for Transfer Assistance: min assist to support trunk during transitions from low  recliner chair.  Ambulation/Gait Ambulation/Gait Assistance: 4: Min assist Ambulation Distance (Feet): 20 Feet Assistive device: Rolling walker Ambulation/Gait Assistance Details: cues to stay closer to RW to help support her weak and arthritic legs.  Min assist to help support trunk over weak legs.  Pt with very limited endurance right now due to several days in the bed and refusing PT x 2 sessions in a row.   Gait Pattern: Step-through pattern;Decreased stride length;Trunk flexed Gait velocity: decreased    Exercises General Exercises - Upper Extremity Shoulder Flexion: AROM;Both;10 reps;Seated Elbow Flexion: AROM;Both;10 reps;Seated General Exercises - Lower Extremity Long Arc Quad: AROM;Both;10 reps;Seated Hip ABduction/ADduction: AROM;Both;10 reps;Seated Hip Flexion/Marching: AROM;Both;10 reps;Seated Toe Raises: AROM;Both;10 reps;Seated Heel Raises: AROM;Both;10 reps;Seated     PT Goals (current goals can now be found in the care plan section) Acute Rehab PT Goals Patient Stated Goal: to get stronger, reduce pain  Visit Information  Last PT Received On: 11/08/12 Assistance Needed: +1 History of Present Illness: 79 y.o. female admitted to [facility] on 10/31/10 with [place].  Of note, pt with h/o chronic back and neck pain.      Subjective Data  Subjective: Pt reports that she is not going to a nursing home.   Patient Stated Goal: to get stronger, reduce pain   Cognition  Cognition Arousal/Alertness: Awake/alert Behavior During Therapy: WFL for tasks assessed/performed Overall Cognitive Status: Within Functional Limits for tasks assessed    Balance  Static Standing Balance Static Standing - Balance Support: Bilateral upper extremity  supported Static Standing - Level of Assistance: 4: Min assist Dynamic Standing Balance Dynamic Standing - Balance Support: Bilateral upper extremity supported Dynamic Standing - Level of Assistance: 4: Min assist  End of Session PT - End of  Session Activity Tolerance: Patient limited by fatigue;Patient limited by pain Patient left: in chair;with call bell/phone within reach     [place] B. Zetta Stoneman, PT, DPT [phone]   11/08/2012, 11:08 AM

## 2012-11-08 NOTE — Progress Notes (Signed)
Pt c/o abd pain and nausea after eating breakfast this AM; pt medicated with tylenol and zofran; Lucile Crater made aware, will continue to monitor

## 2012-11-08 NOTE — Progress Notes (Addendum)
Inpatient Diabetes Program Recommendations  AACE/ADA: New Consensus Statement on Inpatient Glycemic Control (2013)  Target Ranges:  Prepandial:   less than 140 mg/dL      Peak postprandial:   less than 180 mg/dL (1-2 hours)      Critically ill patients:  140 - 180 mg/dL     Results for CHANTEE, CERINO (MRN [id number]) as of 11/08/2012 11:12  Ref. Range 11/07/2012 07:58 11/07/2012 11:24 11/07/2012 16:31  Glucose-Capillary Latest Range: 70-99 mg/dL 956 (H) 213 (H) 086 (H)    Results for OLINDA, NOLA (MRN [id number]) as of 11/08/2012 11:12  Ref. Range 11/08/2012 07:40  Glucose-Capillary Latest Range: 70-99 mg/dL 528 (H)    **Patient refused Lantus 60 units last pm.  Per RN, patient refused Lantus insulin b/c CBG was "too low".  Per RN, patient's CBG was 99 mg/dl at bedtime.  As a result of missing her Lantus insulin, patient with elevated fasting glucose today.  Will follow. Ambrose Finland RN, MSN, CDE Diabetes Coordinator Inpatient Diabetes Program Team Pager: [phone] (8a-10p)

## 2012-11-08 NOTE — Progress Notes (Signed)
Physical Therapy Treatment Patient Details Name: Cathy [name] MRN: [id number] DOB: July 31, 1930 Today's Date: 11/08/2012 Time: 7829-5621 PT Time Calculation (min): 16 min  PT Assessment / Plan / Recommendation  History of Present Illness 79 y.o. female admitted to [facility] on 10/31/10 with [place].  Of note, pt with h/o chronic back and neck pain.     PT Comments   Pt benefited from two sessions of PT today.  She also had an OT session, so she had 3 total therapy sessions today and you can tell.  This afternoon's session went much better than the morning.  Pt is looking more alert.  She was able to mobilize with less assistance and walk into the hallway a good distance before turning around.  She was reluctant, but agreeable to sit up in the recliner for supper.  "Don't you know I'm sick?"  I explained to the pt even though she is sick we still had to move or she would be too weak to go home by herself.  This statement is always met by, "I'm not going to no nursing home!"  Follow Up Recommendations  SNF;Other (comment) (SNF most appropriate at this time-pt adamately refusing)     Does the patient have the potential to tolerate intense rehabilitation    NA  Barriers to Discharge   Decreased caregiver support      Equipment Recommendations  None recommended by PT    Recommendations for Other Services   None  Frequency Min 3X/week   Progress towards PT Goals Progress towards PT goals: Progressing toward goals  Plan Current plan remains appropriate    Precautions / Restrictions Precautions Precautions: Fall   Pertinent Vitals/Pain See vitals flow sheet.     Mobility  Bed Mobility Supine to Sit: 6: Modified independent (Device/Increase time);With rails;HOB elevated Sitting - Scoot to Edge of Bed: 6: Modified independent (Device/Increase time);With rail Sit to Supine: 6: Modified independent (Device/Increase time);With rail Details for Bed Mobility Assistance: pt relying heavily on railing  to pull herself to sitting.  Transfers Transfers: Sit to Stand;Stand to Sit Sit to Stand: 4: Min guard;With upper extremity assist;With armrests;From bed Stand to Sit: 4: Min guard;Without upper extremity assist;With armrests;To chair/3-in-1 Details for Transfer Assistance: min guard assist for balance during transition of hands between support surface and RW.  Uncontrolled descent to sit in recliner chair.  Ambulation/Gait Ambulation/Gait Assistance: 4: Min guard Ambulation Distance (Feet): 100 Feet Assistive device: Rolling walker Ambulation/Gait Assistance Details: cues to stay inside of RW during gait, especially as she reported fatigue and her knees became weaker.   Gait Pattern: Step-through pattern;Decreased stride length;Trunk flexed Gait velocity: decreased      PT Goals (current goals can now be found in the care plan section) Acute Rehab PT Goals Patient Stated Goal: to get stronger, reduce pain  Visit Information  Last PT Received On: 11/08/12 Assistance Needed: +1 History of Present Illness: 79 y.o. female admitted to [facility] on 10/31/10 with [place].  Of note, pt with h/o chronic back and neck pain.      Subjective Data  Subjective: Pt reluctant to get up.  Needed strong pursuasion to participate and sit up in the chair.   Patient Stated Goal: to get stronger, reduce pain   Cognition  Cognition Arousal/Alertness: Awake/alert Behavior During Therapy: WFL for tasks assessed/performed Overall Cognitive Status: Within Functional Limits for tasks assessed    Balance  Static Sitting Balance Static Sitting - Balance Support: Feet supported Static Sitting - Level of  Assistance: 7: Independent Cytogeneticist Standing - Balance Support: Bilateral upper extremity supported Static Standing - Level of Assistance: 4: Min assist Dynamic Standing Balance Dynamic Standing - Balance Support: Bilateral upper extremity supported Dynamic Standing - Level of Assistance: 4:  Min assist  End of Session PT - End of Session Activity Tolerance: Patient limited by fatigue Patient left: in chair Nurse Communication: Mobility status     Lurena Joiner B. Jere Bostrom, PT, DPT [phone]   11/08/2012, 4:36 PM

## 2012-11-08 NOTE — Progress Notes (Signed)
Patient ID: Cathy [name], female   DOB: [birth year], 79 y.o.   MRN: [id number]    SUBJECTIVE:   As I was attempting to write today's note, my note from yesterday is marked as deleted. I cannot change this. However that note should not be deleted. After I saw the patient yesterday GI felt that the fever was not from the common bile duct stone procedure. The patient had some diarrhea and some overflow incontinence. She refused physical therapy yesterday. She was able to eat regular food last night. She says that with this her diarrhea seems better. She continues to complain of overflow incontinence. She also says that she is too weak to get up. I had a careful discussion with her about the fact that she would need to go to a nursing home if we cannot get her stronger soon. She said that she would push harder today.   Filed Vitals:   11/07/12 0900 11/07/12 1355 11/07/12 2030 11/08/12 0539  BP: 162/72 127/52 143/64 146/44  Pulse: 88 82 77 70  Temp: 98.9 F (37.2 C) 99.3 F (37.4 C) 99 F (37.2 C) 98.9 F (37.2 C)  TempSrc: Oral Oral Oral   Resp: 20 18 18 16   Height:      Weight:      SpO2: 94% 99% 99% 98%     Intake/Output Summary (Last 24 hours) at 11/08/12 0705 Last data filed at 11/08/12 0300  Gross per 24 hour  Intake   1140 ml  Output   1507 ml  Net   -367 ml    LABS: Basic Metabolic Panel:  Recent Labs  40/98/11 0620 11/08/12 0450  NA 136 137  K 3.9 3.5  CL 97 102  CO2 29 27  GLUCOSE 172* 236*  BUN 13 12  CREATININE 0.93 0.77  CALCIUM 9.3 9.1   Liver Function Tests:  Recent Labs  11/06/12 0620  AST 99*  ALT 128*  ALKPHOS 186*  BILITOT 1.2  PROT 6.7  ALBUMIN 2.9*    Recent Labs  11/05/12 1215  LIPASE 17   CBC:  Recent Labs  11/06/12 0620  WBC 11.6*  HGB 13.7  HCT 41.3  MCV 91.2  PLT 221   Cardiac Enzymes: No results found for this basename: CKTOTAL, CKMB, CKMBINDEX, TROPONINI,  in the last 72 hours BNP: No components found with this  basename: POCBNP,  D-Dimer: No results found for this basename: DDIMER,  in the last 72 hours Hemoglobin A1C: No results found for this basename: HGBA1C,  in the last 72 hours Fasting Lipid Panel: No results found for this basename: CHOL, HDL, LDLCALC, TRIG, CHOLHDL, LDLDIRECT,  in the last 72 hours Thyroid Function Tests: No results found for this basename: TSH, T4TOTAL, FREET3, T3FREE, THYROIDAB,  in the last 72 hours  RADIOLOGY: US Abdomen Complete  11/01/2012   CLINICAL DATA:  Epigastric abdominal pain. Diabetes. Chronic kidney disease.  EXAM: ULTRASOUND ABDOMEN COMPLETE  COMPARISON:  None.  FINDINGS: Gallbladder  Multiple tiny gallstones and gallbladder sludge noted. No evidence of gallbladder wall thickening. No definite sonographic Murphy sign noted.  Common bile duct  Diameter: 10 mm in diameter.  Liver  Diffusely increased echogenicity of the hepatic parenchyma, consistent with hepatic steatosis. No focal mass lesion identified.  IVC  No abnormality visualized.  Pancreas  Visualized portion unremarkable.  Spleen  Size and appearance within normal limits.  Right Kidney  Length: 11.7 cm. Echogenicity within normal limits. No mass or hydronephrosis visualized.  Left Kidney  Length: 12.7 cm. Echogenicity within normal limits. Tiny cyst noted in upper pole. No mass or hydronephrosis visualized.  Abdominal aorta  No aneurysm visualized.  IMPRESSION: Cholelithiasis. No definite sonographic signs of acute cholecystitis.  Dilated common bile duct measuring 10 mm. Etiology not apparent by ultrasound. Consider nonemergent MRCP for further evaluation.  Hepatic steatosis.   Electronically Signed   By: Myles Rosenthal M.D.   On: 11/01/2012 21:48   [name]  11/03/2012   CLINICAL DATA:  Elevated liver function tests. Atypical chest pain. Biliary dilatation.  EXAM: MRI ABDOMEN WITHOUT  (INCLUDING MRCP)  TECHNIQUE: Multiplanar multisequence MR imaging of the abdomen was performed. Heavily  T2-weighted images of the biliary and pancreatic ducts were obtained, and three-dimensional MRCP images were rendered by post processing.  COMPARISON:  Multiple exams, including 11/01/2012 and 10/17/2010  FINDINGS: Diffuse hepatic steatosis.  Common bile duct dilated to 12 mm, with 10-15 small filling defects in the distal CBD compatible with stones. The largest measures 7 mm in long axis. There is borderline intrahepatic biliary dilatation.  Multiple gallstones are subtle dependent Go in the gallbladder and measure up to 1.3 cm in diameter.  Pancreas divisum noted. No dorsal pancreatic duct dilatation.  Complex 1.3 cm left kidney upper pole cyst with internal fluid-fluid level. Focal scarring posteriorly in the right mid kidney (versus highly fatty angiomyolipoma.).  Lumbar spondylosis and degenerative disc disease.  T2 hyperintense but mildly heterogeneous 1.2 cm lesion in segment 6 of the liver, image 27 of series 4, stable in size from 01/23/2009. 0.6 cm T2 hyperintense lesion in segment 4a of the liver. Dedicated 3D acquisitions are obscured by marked motion artifact.  IMPRESSION: 1. Choledocholithiasis, with over 10 small filling defects in the distal CBD compatible with stones, largest 7 mm in long axis. CBD dilated to 12 mm, with borderline intrahepatic biliary dilatation. 2. Multiple gallstones in the gallbladder. 3. Pancreas divisum without dorsal pancreatic duct dilatation. 4. Complex cyst in the left kidney upper pole, similar to 2011, with internal fluid-fluid level. 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 5. Two T2 hyperintense lesions in the liver are stable from 2011 and highly likely to be benign cysts or similar benign lesions.   Electronically Signed   By: Herbie Baltimore M.D.   On: 11/03/2012 09:23   Mr 3d Recon At Scanner  11/03/2012   CLINICAL DATA:  Elevated liver function tests. Atypical chest pain. Biliary dilatation.  EXAM: MRI ABDOMEN WITHOUT   (INCLUDING MRCP)  TECHNIQUE: Multiplanar multisequence MR imaging of the abdomen was performed. Heavily T2-weighted images of the biliary and pancreatic ducts were obtained, and three-dimensional MRCP images were rendered by post processing.  COMPARISON:  Multiple exams, including 11/01/2012 and 10/17/2010  FINDINGS: Diffuse hepatic steatosis.  Common bile duct dilated to 12 mm, with 10-15 small filling defects in the distal CBD compatible with stones. The largest measures 7 mm in long axis. There is borderline intrahepatic biliary dilatation.  Multiple gallstones are subtle dependent Go in the gallbladder and measure up to 1.3 cm in diameter.  Pancreas divisum noted. No dorsal pancreatic duct dilatation.  Complex 1.3 cm left kidney upper pole cyst with internal fluid-fluid level. Focal scarring posteriorly in the right mid kidney (versus highly fatty angiomyolipoma.).  Lumbar spondylosis and degenerative disc disease.  T2 hyperintense but mildly heterogeneous 1.2 cm lesion in segment 6 of the liver, image 27 of series 4, stable in size from 01/23/2009. 0.6 cm T2 hyperintense lesion  in segment 4a of the liver. Dedicated 3D acquisitions are obscured by marked motion artifact.  IMPRESSION: 1. Choledocholithiasis, with over 10 small filling defects in the distal CBD compatible with stones, largest 7 mm in long axis. CBD dilated to 12 mm, with borderline intrahepatic biliary dilatation. 2. Multiple gallstones in the gallbladder. 3. Pancreas divisum without dorsal pancreatic duct dilatation. 4. Complex cyst in the left kidney upper pole, similar to 2011, with internal fluid-fluid level. 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 5. Two T2 hyperintense lesions in the liver are stable from 2011 and highly likely to be benign cysts or similar benign lesions.   Electronically Signed   By: Herbie Baltimore M.D.   On: 11/03/2012 09:23   Nm Myocar Multi W/spect W/wall Motion / Ef  11/01/2012    *RADIOLOGY REPORT*  Clinical Data:  Chest pain, history of CAD, atrial fibrillation, congestive heart failure, ischemic cardiomyopathy, diabetes, hyperlipidemia  MYOCARDIAL IMAGING WITH SPECT (REST AND PHARMACOLOGIC-STRESS) GATED LEFT VENTRICULAR WALL MOTION STUDY LEFT VENTRICULAR EJECTION FRACTION  Technique:  Standard myocardial SPECT imaging was performed after resting intravenous injection of 10 mCi Tc-41m sestamibi. Subsequently, intravenous infusion of lexiscan was performed under the supervision of the Cardiology staff.  At peak effect of the drug, 30 mCi Tc-96m sestamibi was injected intravenously and standard myocardial SPECT  imaging was performed.  Quantitative gated imaging was also performed to evaluate left ventricular wall motion, and estimate left ventricular ejection fraction.  Comparison:  Chest radiograph - 10/31/2012  Findings:  Review of the rotational raw images demonstrates significant GI activity, worse on the provided the stress images comparison to the rest.  There is no significant patient motion artifact.  A minimal amount of radiotracer is seen within the right upper extremity IV site.  SPECT imaging demonstrates mild dilatation of the left ventricular cavity.  There is a grossly matched moderate area of non perfusion involving the inferior wall of the left ventricle suggestive of prior infarction.  There are additional areas of matched non perfusion involving the apical aspect of the left ventricular septum as well as the basilar aspect of the lateral wall left ventricle, also worrisome for additional areas of prior infarction. Given the suspected areas of prior infarction, there is no definitive scintigraphic evidence of pharmacologically induced ischemia.  Quantitative gated analysis shows geographic areas of hypokinesia/akinesia involving the areas of suspected prior infarction.  The resting left ventricular ejection fraction is 41% with end- diastolic volume of 132 ml and  end-systolic volume of 78 ml.  IMPRESSION: 1.  Findings worrisome for prior infarctions involving the inferior wall, apical aspect of the septum and basilar aspect of the lateral wall.  Given background of suspected prior infarctions, there is no definitive scintigraphic evidence of pharmacologically induced ischemia. 2.  Mildly dilated left ventricle with geographic areas of hypokinesia/akinesia involving the suspected areas of prior infarction as detailed above.  Ejection fraction - 41%.   Original Report Authenticated By: Tacey Ruiz, MD   Dg Chest Port 1 View  10/31/2012   *RADIOLOGY REPORT*  Clinical Data: Chest pain  PORTABLE CHEST - 1 VIEW  Comparison: Prior radiograph from 06/26/2012  Findings: Median sternotomy wires with underlying CABG markers are unchanged.  Cardiomegaly is stable.  The lungs are normally inflated.  There is mild central pulmonary vascular congestion without frank pulmonary edema.  No focal infiltrate is identified.  No pneumothorax or pleural effusion.  Osseous structures are unchanged.  Extensive degenerative changes are noted about  both shoulders.  IMPRESSION: Cardiomegaly with mild pulmonary vascular congestion.  No frank pulmonary edema or focal infiltrate identified.   Original Report Authenticated By: Rise Mu, M.D.   Dg Ercp With Sphincterotomy  11/04/2012   CLINICAL DATA:  Choledocholithiasis.  EXAM: ERCP performed by Dr. Ewing Schlein.  TECHNIQUE: Multiple spot images obtained with the fluoroscopic device and submitted for interpretation post-procedure.  COMPARISON:  MRCP dated 11/02/2012  FINDINGS: Three spot films from the ERCP are demonstrated. There are multiple stones in the common bile duct. The last radiograph demonstrates no stones in the visualized portion of the duct.  IMPRESSION: Choledocholithiasis, with stone extraction.  These images were submitted for radiologic interpretation only. Please see the procedural report for the amount of contrast and the  fluoroscopy time utilized.   Electronically Signed   By: Geanie Cooley M.D.   On: 11/04/2012 14:50    Physical exam:   Patient is oriented to person time and place. Affect is normal. She's lying flat in bed. There is no jugulovenous distention. Lungs are clear. Respiratory effort is nonlabored. Cardiac exam reveals S1 and S2. The abdomen is soft. There is no pain to palpation. She has no significant peripheral edema.   TELEMETRY: I have personally reviewed telemetry today November 08, 2012. There is normal sinus rhythm.   ASSESSMENT AND PLAN:    Statin intolerance    DM (diabetes mellitus), type 2 with neurological complications    Patient is receiving treatment.     CKD (chronic kidney disease)     Renal function is remaining stable.    Chronic systolic CHF (congestive heart failure)      Volume status is stable.    Elevated LFTs    LFTs are improved    UTI (urinary tract infection)    Patient is receiving Keflex for her urinary tract infection.  The 7 day course will be completed tomorrow.    H/O amiodarone therapy     She is receiving oral amiodarone    Common bile duct (CBD) obstruction     This has been treated this admission    Fatigue     She continues to be fatigued. I'm hopefully to become more active today. If she does not, we will have to begin to arrange nursing facility.    Fever    At this time there is no significant fever. She will receive her last dose of Keflex for her urinary tract infection tomorrow    Diarrhea     Hopefully this is resolved.    Overflow incontinence     We will obtain urology consultation today.  It is well documented that I have been managing a multitude of different problems. Urology help will be obtained today. The last physical therapy to return to see her again today. If she make some progress we will continue. If not, we will begin to finalize a plan for nursing home placement.   [name] 11/08/2012 7:05 AM

## 2012-11-08 NOTE — Progress Notes (Signed)
Assisted pt up to the chair, pt remained around a hour; pt now wanting to go back to bed, adamantly refusing to stay up in the chair

## 2012-11-08 NOTE — Progress Notes (Signed)
Pt voided 350cc urine; pt post void residual 405cc found; on call fellow paged, will continue to monitor

## 2012-11-09 LAB — URINE CULTURE: Colony Count: >=100000

## 2012-11-09 LAB — GLUCOSE, CAPILLARY
Glucose-Capillary: 203 mg/dL — ABNORMAL HIGH (ref 70–99)
Glucose-Capillary: 164 mg/dL — ABNORMAL HIGH (ref 70–99)
Glucose-Capillary: 92 mg/dL (ref 70–99)

## 2012-11-09 MED ORDER — ACETAMINOPHEN-CODEINE #3 300-30 MG PO TABS
2.0000 | ORAL_TABLET | Freq: Four times a day (QID) | ORAL | Status: DC | PRN
  Administered 2012-11-09 – 2012-11-10 (×3): 2 via ORAL
  Filled 2012-11-09 (×3): qty 2

## 2012-11-09 MED ORDER — TAMSULOSIN HCL 0.4 MG PO CAPS
0.4000 mg | ORAL_CAPSULE | Freq: Every day | ORAL | Status: DC
  Administered 2012-11-09 – 2012-11-10 (×2): 0.4 mg via ORAL
  Filled 2012-11-09 (×2): qty 1

## 2012-11-09 MED ORDER — INSULIN GLARGINE 100 UNIT/ML ~~LOC~~ SOLN
65.0000 [IU] | Freq: Every day | SUBCUTANEOUS | Status: DC
  Administered 2012-11-09: 65 [IU] via SUBCUTANEOUS
  Filled 2012-11-09 (×2): qty 0.65

## 2012-11-09 MED ORDER — TRAZODONE 25 MG HALF TABLET
25.0000 mg | ORAL_TABLET | Freq: Once | ORAL | Status: AC
  Administered 2012-11-09: 25 mg via ORAL
  Filled 2012-11-09: qty 1

## 2012-11-09 MED ORDER — CIPROFLOXACIN HCL 500 MG PO TABS
500.0000 mg | ORAL_TABLET | Freq: Two times a day (BID) | ORAL | Status: DC
  Administered 2012-11-09 – 2012-11-10 (×2): 500 mg via ORAL
  Filled 2012-11-09 (×4): qty 1

## 2012-11-09 MED ORDER — INSULIN ASPART 100 UNIT/ML ~~LOC~~ SOLN
15.0000 [IU] | Freq: Three times a day (TID) | SUBCUTANEOUS | Status: DC
  Administered 2012-11-09 – 2012-11-10 (×3): 15 [IU] via SUBCUTANEOUS

## 2012-11-09 NOTE — Progress Notes (Signed)
Physical Therapy Treatment Patient Details Name: Cathy [name] MRN: [id number] DOB: 12/29/1930 Today's Date: 11/09/2012 Time: 4098-1191 PT Time Calculation (min): 28 min  PT Assessment / Plan / Recommendation  History of Present Illness 79 y.o. female admitted to [facility] on 10/31/10 with [place].  Of note, pt with h/o chronic back and neck pain.     PT Comments   Despite the fact that the pt did not walk as far this AM as she did yesterday, she is moving with less assistance.  Overall, she is now at supervision level.  She would be safe to go home as long as she maintains or improves her current level of mobility.    Follow Up Recommendations  Home health PT;Supervision - Intermittent     Does the patient have the potential to tolerate intense rehabilitation    NA  Barriers to Discharge   Decreased caregiver support      Equipment Recommendations  None recommended by PT    Recommendations for Other Services   None  Frequency Min 3X/week   Progress towards PT Goals Progress towards PT goals: Progressing toward goals  Plan Discharge plan needs to be updated    Precautions / Restrictions Precautions Precautions: Fall   Pertinent Vitals/Pain See vitals flow sheet.     Mobility  Bed Mobility Supine to Sit: 6: Modified independent (Device/Increase time);With rails Sitting - Scoot to Edge of Bed: 6: Modified independent (Device/Increase time);With rail Details for Bed Mobility Assistance: Pt moving better to EOB today Transfers Transfers: Sit to Stand;Stand to Sit Sit to Stand: 5: Supervision;With upper extremity assist;With armrests;From bed Stand to Sit: 5: Supervision;With upper extremity assist;With armrests;To chair/3-in-1 Details for Transfer Assistance: slow transition to stand and uncontrolled descent to sit with heavy reliance on arms for support during transitions.   Ambulation/Gait Ambulation/Gait Assistance: 5: Supervision Ambulation Distance (Feet): 70 Feet Assistive  device: Rolling walker Ambulation/Gait Assistance Details: continued cues to stay closer to RW for support.  Pt gets this cue multiple times every session and today she reports "I forget" when trying to remember safety cues.   Gait Pattern: Step-through pattern;Decreased stride length;Trunk flexed Gait velocity: decreased General Gait Details: Pt did not walk as far as yesterday afternoon, stating that she is fatigued.  PT did wake her from sleeping this AM.      Exercises General Exercises - Upper Extremity Shoulder Flexion: AROM;Both;10 reps;Seated Elbow Flexion: AROM;Both;10 reps;Seated General Exercises - Lower Extremity Long Arc Quad: AROM;Both;10 reps;Seated Hip ABduction/ADduction: AROM;Both;10 reps;Seated Hip Flexion/Marching: AROM;Both;10 reps;Seated Toe Raises: AROM;Both;10 reps;Seated Heel Raises: AROM;Both;10 reps;Seated     PT Goals (current goals can now be found in the care plan section) Acute Rehab PT Goals Patient Stated Goal: to get stronger, reduce pain  Visit Information  Last PT Received On: 11/09/12 Assistance Needed: +1 History of Present Illness: 79 y.o. female admitted to [facility] on 10/31/10 with [place].  Of note, pt with h/o chronic back and neck pain.      Subjective Data  Subjective: Pt ready to move today.  Reports that her "urethra polyps" are bothering her and if the doctors do not fix her urinary issues before she leaves she is not going home.   Patient Stated Goal: to get stronger, reduce pain   Cognition  Cognition Arousal/Alertness: Awake/alert Behavior During Therapy: WFL for tasks assessed/performed Overall Cognitive Status: Within Functional Limits for tasks assessed Memory: Decreased short-term memory (not much carryover from education session to session)    Balance  Static  Sitting Balance Static Sitting - Balance Support: Feet supported Static Sitting - Level of Assistance: 7: Independent Static Standing Balance Static Standing - Balance  Support: Bilateral upper extremity supported Static Standing - Level of Assistance: 5: Stand by assistance Dynamic Standing Balance Dynamic Standing - Balance Support: Bilateral upper extremity supported Dynamic Standing - Level of Assistance: 5: Stand by assistance  End of Session PT - End of Session Activity Tolerance: Patient limited by fatigue Patient left: in chair;with call bell/phone within reach Nurse Communication: Mobility status     Lurena Joiner B. Dymin Dingledine, PT, DPT [phone]   11/09/2012, 10:37 AM

## 2012-11-09 NOTE — Progress Notes (Signed)
MEDICATION RELATED NOTE    Pharmacy Re:  Potential drug/drug interaction Medications:  Cipro/Amiodarone  Effects: The rare occurrence of arrhythmias resulting from the potential for additive QT prolongation should be considered as a possibility when ciprofloxacin and amiodarone are coadministered. Clinical impact is not known. Coadministration of ciprofloxacin with amiodarone may be contraindicated in official package labeling.  Mechanism: Both ciprofloxacin and amiodarone can cause prolongation of the QT interval. Additive prolongation may occur.  QTc = 481 on 11/01/2012  Recommendations: Please consider alternative antibiotics or limiting duration of therapy to avoid increased potential in this elderly patient.  Allergies  Allergen Reactions  . Atorvastatin Other (See Comments)    Unknown-patient admitted to hospital after taking  . Citrus Dermatitis    BREAK OUT ON BODY  . Contrast Media [Iodinated Diagnostic Agents] Other (See Comments)    Passed out  . Iohexol      Desc: ANAPHYLAXIS   . Plavix [Clopidogrel Bisulfate]     Not an allergy - but PRU was drawn 06/2012 indicating Plavix hyporesponsiveness, thus she was changed to Brilinta.  . Zolpidem Tartrate     REACTION: hallucinations  . Milk-Related Compounds Rash   Patient Measurements: Height: 5\' 5"  (165.1 cm) Weight: 199 lb 4.8 oz (90.402 kg) IBW/kg (Calculated) : 57  Vital Signs: Temp: 99.6 F (37.6 C) (10/21 0602) BP: 168/70 mmHg (10/21 0602) Pulse Rate: 61 (10/21 1032) Intake/Output from previous day: 10/20 0701 - 10/21 0700 In: -  Out: 750 [Urine:750]  Labs:  Recent Labs  11/08/12 0450 11/08/12 0950  WBC  --  8.9  HGB  --  11.5*  HCT  --  35.8*  PLT  --  194  CREATININE 0.77  --   ALBUMIN  --  2.3*  PROT  --  6.2  AST  --  14  ALT  --  41*  ALKPHOS  --  125*  BILITOT  --  0.6  BILIDIR  --  0.2  IBILI  --  0.4   Estimated Creatinine Clearance: 62.3 ml/min (by C-G formula based on Cr of  0.77).  Microbiology: Recent Results (from the past 720 hour(s))  URINE CULTURE     Status: None   Collection Time    10/31/12 12:56 AM      Result Value Range Status   Specimen Description URINE, CLEAN CATCH   Final   Special Requests CX ADDED AT 0117 ON [id number]   Final   Culture  Setup Time     Final   Value: 10/31/2012 01:24     Performed at [facility]   Colony Count     Final   Value: >=100,000 COLONIES/ML     Performed at [facility]   Culture     Final   Value: ESCHERICHIA COLI     Performed at [facility]   Report Status 11/02/2012 FINAL   Final   Organism ID, Bacteria ESCHERICHIA COLI   Final  CULTURE, BLOOD (ROUTINE X 2)     Status: None   Collection Time    11/07/12  9:25 AM      Result Value Range Status   Specimen Description BLOOD LEFT ARM   Final   Special Requests BOTTLES DRAWN AEROBIC AND ANAEROBIC 10CC   Final   Culture  Setup Time     Final   Value: 11/07/2012 17:55     Performed at [facility]   Culture     Final   Value:  BLOOD CULTURE RECEIVED NO GROWTH TO DATE CULTURE WILL BE HELD FOR 5 DAYS BEFORE ISSUING A FINAL NEGATIVE REPORT     Performed at [facility]   Report Status PENDING   Incomplete  CULTURE, BLOOD (ROUTINE X 2)     Status: None   Collection Time    11/07/12  9:35 AM      Result Value Range Status   Specimen Description BLOOD LEFT HAND   Final   Special Requests BOTTLES DRAWN AEROBIC ONLY 10CC   Final   Culture  Setup Time     Final   Value: 11/07/2012 17:55     Performed at [facility]   Culture     Final   Value:        BLOOD CULTURE RECEIVED NO GROWTH TO DATE CULTURE WILL BE HELD FOR 5 DAYS BEFORE ISSUING A FINAL NEGATIVE REPORT     Performed at [facility]   Report Status PENDING   Incomplete  URINE CULTURE     Status: None   Collection Time    11/07/12  9:55 AM      Result Value Range Status   Specimen Description URINE, CLEAN CATCH   Final   Special Requests  PATIENT ON FOLLOWING [facility]   Final   Culture  Setup Time     Final   Value: 11/07/2012 20:40     Performed at [facility] Count     Final   Value: >=100,000 COLONIES/ML     Performed at [facility]   Culture     Final   Value: PSEUDOMONAS AERUGINOSA     Performed at [facility]   Report Status 11/09/2012 FINAL   Final   Organism ID, Bacteria PSEUDOMONAS AERUGINOSA   Final   Medical History: Past Medical History  Diagnosis Date  . Hypertension     Unspecified  . Hyperlipidemia     Mixed, statin intolerance.  [name] CAD (coronary artery disease) 08/21/09    a. CAD s/p CABG 2003. b. Cath 2006: occluded OM vein graft. c. Anterior MI 04/15/12 s/p PTCA only to ramus (stenting unsuccessful at that time) d. NSTEMI 06/2012: failed med rx, s/p DES to ramus c/b vessel perforation tx with balloon/stenting. Plavix hyporesponsive so changed to Brilinta.  . S/P CABG (coronary artery bypass graft) 2003     2003  LIMA, LAD, SVG to the OM, SVG right coronary artery  . Thoracic ascending aortic aneurysm 2003    Ascending thoracic aneurysm repair with a graft at time of CABG  . Diabetes mellitus     Insulin dependent  . Gastroparesis   . Depression   . Allergy history, radiographic dye     Contrast dye allergy  . Previous back surgery   . Syncope 2008    Question?  2008  . Chronic systolic heart failure   . Ischemic cardiomyopathy 06/2011    a. Echo 04/19/12: EF 25-30%, mod LVH, diffuse HK, trivial AI  . Hypotension     October, 2013  . Hypothyroidism     Treated with low-dose Synthroid  . CKD (chronic kidney disease)   . Carotid artery disease     a. 80-99% prox RICA stenosis - with recent cardiac event 06/2012, favor waiting at least 1 month, optimally 3 months post PCI.  [name] Hypertriglyceridemia   . Statin intolerance   . Peripheral neuropathy   . Atrial fibrillation     a. Noted during 06/2012  hospitalization. Placed on amiodarone. Plan is for event monitor to  assess for breakthrough, hold off anticoag unless additional AF seen.  [name] NSVT (nonsustained ventricular tachycardia)     a. Noted during 06/2012 hospitalization.    Nadara Mustard, PharmD., MS Clinical Pharmacist Pager:  [phone] Thank you for allowing pharmacy to be part of this patients care team. 11/09/2012,2:51 PM

## 2012-11-09 NOTE — Progress Notes (Signed)
SUBJECTIVE: The patient is feeling a little stronger today. She had been receiving allotted for abdominal pain around the time of her common bile duct stone removal. Cutting the pain medicines back today. She is getting a little stronger. I appreciate help from the diabetes management team. I have adjusted her doses. I appreciate also help from care management. The patient definitely refuses nursing home placement even though his pain recommended. The plan will be to continue with OT and PT today with plans for the patient to go home tomorrow. I'm so trying to finalize her urology evaluation.   Filed Vitals:   11/08/12 1500 11/08/12 1715 11/08/12 1952 11/09/12 0602  BP: 153/69 150/55 163/76 168/70  Pulse: 69 67 63 69  Temp: 97.6 F (36.4 C)  98.7 F (37.1 C) 99.6 F (37.6 C)  TempSrc:      Resp: 18  18 18   Height:      Weight:    199 lb 4.8 oz (90.402 kg)  SpO2: 95%  93% 92%     Intake/Output Summary (Last 24 hours) at 11/09/12 0817 Last data filed at 11/09/12 0610  Gross per 24 hour  Intake      0 ml  Output    750 ml  Net   -750 ml    LABS: Basic Metabolic Panel:  Recent Labs  16/10/96 0450  NA 137  K 3.5  CL 102  CO2 27  GLUCOSE 236*  BUN 12  CREATININE 0.77  CALCIUM 9.1   Liver Function Tests:  Recent Labs  11/08/12 0950  AST 14  ALT 41*  ALKPHOS 125*  BILITOT 0.6  PROT 6.2  ALBUMIN 2.3*   No results found for this basename: LIPASE, AMYLASE,  in the last 72 hours CBC:  Recent Labs  11/08/12 0950  WBC 8.9  NEUTROABS 5.2  HGB 11.5*  HCT 35.8*  MCV 93.5  PLT 194   Cardiac Enzymes: No results found for this basename: CKTOTAL, CKMB, CKMBINDEX, TROPONINI,  in the last 72 hours BNP: No components found with this basename: POCBNP,  D-Dimer: No results found for this basename: DDIMER,  in the last 72 hours Hemoglobin A1C: No results found for this basename: HGBA1C,  in the last 72 hours Fasting Lipid Panel: No results found for this  basename: CHOL, HDL, LDLCALC, TRIG, CHOLHDL, LDLDIRECT,  in the last 72 hours Thyroid Function Tests: No results found for this basename: TSH, T4TOTAL, FREET3, T3FREE, THYROIDAB,  in the last 72 hours  RADIOLOGY: US Abdomen Complete  11/01/2012   CLINICAL DATA:  Epigastric abdominal pain. Diabetes. Chronic kidney disease.  EXAM: ULTRASOUND ABDOMEN COMPLETE  COMPARISON:  None.  FINDINGS: Gallbladder  Multiple tiny gallstones and gallbladder sludge noted. No evidence of gallbladder wall thickening. No definite sonographic Murphy sign noted.  Common bile duct  Diameter: 10 mm in diameter.  Liver  Diffusely increased echogenicity of the hepatic parenchyma, consistent with hepatic steatosis. No focal mass lesion identified.  IVC  No abnormality visualized.  Pancreas  Visualized portion unremarkable.  Spleen  Size and appearance within normal limits.  Right Kidney  Length: 11.7 cm. Echogenicity within normal limits. No mass or hydronephrosis visualized.  Left Kidney  Length: 12.7 cm. Echogenicity within normal limits. Tiny cyst noted in upper pole. No mass or hydronephrosis visualized.  Abdominal aorta  No aneurysm visualized.  IMPRESSION: Cholelithiasis. No definite sonographic signs of acute cholecystitis.  Dilated common bile duct measuring 10 mm. Etiology not apparent by ultrasound. Consider  nonemergent MRCP for further evaluation.  Hepatic steatosis.   Electronically Signed   By: Myles Rosenthal M.D.   On: 11/01/2012 21:48   [name]  11/03/2012   CLINICAL DATA:  Elevated liver function tests. Atypical chest pain. Biliary dilatation.  EXAM: MRI ABDOMEN WITHOUT  (INCLUDING MRCP)  TECHNIQUE: Multiplanar multisequence MR imaging of the abdomen was performed. Heavily T2-weighted images of the biliary and pancreatic ducts were obtained, and three-dimensional MRCP images were rendered by post processing.  COMPARISON:  Multiple exams, including 11/01/2012 and 10/17/2010  FINDINGS: Diffuse hepatic  steatosis.  Common bile duct dilated to 12 mm, with 10-15 small filling defects in the distal CBD compatible with stones. The largest measures 7 mm in long axis. There is borderline intrahepatic biliary dilatation.  Multiple gallstones are subtle dependent Go in the gallbladder and measure up to 1.3 cm in diameter.  Pancreas divisum noted. No dorsal pancreatic duct dilatation.  Complex 1.3 cm left kidney upper pole cyst with internal fluid-fluid level. Focal scarring posteriorly in the right mid kidney (versus highly fatty angiomyolipoma.).  Lumbar spondylosis and degenerative disc disease.  T2 hyperintense but mildly heterogeneous 1.2 cm lesion in segment 6 of the liver, image 27 of series 4, stable in size from 01/23/2009. 0.6 cm T2 hyperintense lesion in segment 4a of the liver. Dedicated 3D acquisitions are obscured by marked motion artifact.  IMPRESSION: 1. Choledocholithiasis, with over 10 small filling defects in the distal CBD compatible with stones, largest 7 mm in long axis. CBD dilated to 12 mm, with borderline intrahepatic biliary dilatation. 2. Multiple gallstones in the gallbladder. 3. Pancreas divisum without dorsal pancreatic duct dilatation. 4. Complex cyst in the left kidney upper pole, similar to 2011, with internal fluid-fluid level. 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 5. Two T2 hyperintense lesions in the liver are stable from 2011 and highly likely to be benign cysts or similar benign lesions.   Electronically Signed   By: Herbie Baltimore M.D.   On: 11/03/2012 09:23   Mr 3d Recon At Scanner  11/03/2012   CLINICAL DATA:  Elevated liver function tests. Atypical chest pain. Biliary dilatation.  EXAM: MRI ABDOMEN WITHOUT  (INCLUDING MRCP)  TECHNIQUE: Multiplanar multisequence MR imaging of the abdomen was performed. Heavily T2-weighted images of the biliary and pancreatic ducts were obtained, and three-dimensional MRCP images were rendered by post  processing.  COMPARISON:  Multiple exams, including 11/01/2012 and 10/17/2010  FINDINGS: Diffuse hepatic steatosis.  Common bile duct dilated to 12 mm, with 10-15 small filling defects in the distal CBD compatible with stones. The largest measures 7 mm in long axis. There is borderline intrahepatic biliary dilatation.  Multiple gallstones are subtle dependent Go in the gallbladder and measure up to 1.3 cm in diameter.  Pancreas divisum noted. No dorsal pancreatic duct dilatation.  Complex 1.3 cm left kidney upper pole cyst with internal fluid-fluid level. Focal scarring posteriorly in the right mid kidney (versus highly fatty angiomyolipoma.).  Lumbar spondylosis and degenerative disc disease.  T2 hyperintense but mildly heterogeneous 1.2 cm lesion in segment 6 of the liver, image 27 of series 4, stable in size from 01/23/2009. 0.6 cm T2 hyperintense lesion in segment 4a of the liver. Dedicated 3D acquisitions are obscured by marked motion artifact.  IMPRESSION: 1. Choledocholithiasis, with over 10 small filling defects in the distal CBD compatible with stones, largest 7 mm in long axis. CBD dilated to 12 mm, with borderline intrahepatic biliary dilatation. 2.  Multiple gallstones in the gallbladder. 3. Pancreas divisum without dorsal pancreatic duct dilatation. 4. Complex cyst in the left kidney upper pole, similar to 2011, with internal fluid-fluid level. 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 5. Two T2 hyperintense lesions in the liver are stable from 2011 and highly likely to be benign cysts or similar benign lesions.   Electronically Signed   By: Herbie Baltimore M.D.   On: 11/03/2012 09:23   Nm Myocar Multi W/spect W/wall Motion / Ef  11/01/2012   *RADIOLOGY REPORT*  Clinical Data:  Chest pain, history of CAD, atrial fibrillation, congestive heart failure, ischemic cardiomyopathy, diabetes, hyperlipidemia  MYOCARDIAL IMAGING WITH SPECT (REST AND PHARMACOLOGIC-STRESS) GATED  LEFT VENTRICULAR WALL MOTION STUDY LEFT VENTRICULAR EJECTION FRACTION  Technique:  Standard myocardial SPECT imaging was performed after resting intravenous injection of 10 mCi Tc-49m sestamibi. Subsequently, intravenous infusion of lexiscan was performed under the supervision of the Cardiology staff.  At peak effect of the drug, 30 mCi Tc-79m sestamibi was injected intravenously and standard myocardial SPECT  imaging was performed.  Quantitative gated imaging was also performed to evaluate left ventricular wall motion, and estimate left ventricular ejection fraction.  Comparison:  Chest radiograph - 10/31/2012  Findings:  Review of the rotational raw images demonstrates significant GI activity, worse on the provided the stress images comparison to the rest.  There is no significant patient motion artifact.  A minimal amount of radiotracer is seen within the right upper extremity IV site.  SPECT imaging demonstrates mild dilatation of the left ventricular cavity.  There is a grossly matched moderate area of non perfusion involving the inferior wall of the left ventricle suggestive of prior infarction.  There are additional areas of matched non perfusion involving the apical aspect of the left ventricular septum as well as the basilar aspect of the lateral wall left ventricle, also worrisome for additional areas of prior infarction. Given the suspected areas of prior infarction, there is no definitive scintigraphic evidence of pharmacologically induced ischemia.  Quantitative gated analysis shows geographic areas of hypokinesia/akinesia involving the areas of suspected prior infarction.  The resting left ventricular ejection fraction is 41% with end- diastolic volume of 132 ml and end-systolic volume of 78 ml.  IMPRESSION: 1.  Findings worrisome for prior infarctions involving the inferior wall, apical aspect of the septum and basilar aspect of the lateral wall.  Given background of suspected prior infarctions, there  is no definitive scintigraphic evidence of pharmacologically induced ischemia. 2.  Mildly dilated left ventricle with geographic areas of hypokinesia/akinesia involving the suspected areas of prior infarction as detailed above.  Ejection fraction - 41%.   Original Report Authenticated By: Tacey Ruiz, MD   Dg Chest Port 1 View  10/31/2012   *RADIOLOGY REPORT*  Clinical Data: Chest pain  PORTABLE CHEST - 1 VIEW  Comparison: Prior radiograph from 06/26/2012  Findings: Median sternotomy wires with underlying CABG markers are unchanged.  Cardiomegaly is stable.  The lungs are normally inflated.  There is mild central pulmonary vascular congestion without frank pulmonary edema.  No focal infiltrate is identified.  No pneumothorax or pleural effusion.  Osseous structures are unchanged.  Extensive degenerative changes are noted about both shoulders.  IMPRESSION: Cardiomegaly with mild pulmonary vascular congestion.  No frank pulmonary edema or focal infiltrate identified.   Original Report Authenticated By: Rise Mu, M.D.   Dg Ercp With Sphincterotomy  11/04/2012   CLINICAL DATA:  Choledocholithiasis.  EXAM: ERCP performed by Dr. Ewing Schlein.  TECHNIQUE: Multiple spot images obtained with the fluoroscopic device and submitted for interpretation post-procedure.  COMPARISON:  MRCP dated 11/02/2012  FINDINGS: Three spot films from the ERCP are demonstrated. There are multiple stones in the common bile duct. The last radiograph demonstrates no stones in the visualized portion of the duct.  IMPRESSION: Choledocholithiasis, with stone extraction.  These images were submitted for radiologic interpretation only. Please see the procedural report for the amount of contrast and the fluoroscopy time utilized.   Electronically Signed   By: Geanie Cooley M.D.   On: 11/04/2012 14:50    PHYSICAL EXAM    The patient is flat in bed. She is stable. She's not having any significant pain. Lungs reveal scattered rhonchi.  Cardiac exam reveals S1-S2. Her abdomen is soft. There is no significant peripheral edema.   TELEMETRY:     I have reviewed telemetry today November 09, 2012. There is sinus rhythm.   ASSESSMENT AND PLAN:    Unstable angina      Cardiac is stable. No further workup.    DM (diabetes mellitus), type 2 with neurological complications       I have made the changes recommended by the diabetes management team    CKD (chronic kidney disease)     I ordered labs to be checked tomorrow morning    Elevated LFTs      Liver function studies are ordered again for tomorrow morning    UTI (urinary tract infection)     Patient is a recurrent urinary tract infections. We have treated 1 during this admission. Some of her bladder scans reveal that she is retaining urine. I request a urology consult yesterday. I'm hopeful that the complete evaluation will be completed as the day goes on today.    Fatigue     The patient is weak and fatigued. I appreciate help from OT and PT. Patient is refusing skilled nursing facility. She says she can manage at home. We will work with her little bit today further with plans for discharge tomorrow.    Lower abdominal pain     She is not complaining of this to me. However she is still requesting some pain meds. I am decreasing her pain meds.    Fever     She's not having any significant recurring fever.    Diarrhea     Diarrheas resolved.    Overflow incontinence      Urology consultation was requested yesterday. We will weak and indicating in today to be sure that this is completed.  [name] 11/09/2012 8:17 AM

## 2012-11-09 NOTE — Progress Notes (Signed)
[facility] PROGRESS NOTE Subjective Asked to see back about nausea. Pt notes that this is same nausea that she had before admission. Now has UTI. Had ERCP 10/16 with normal postprocedure lipase. LFTs improving States nausea there all the time not only after eating.  Objective: Vital signs in last 24 hours: Temp:  [97.6 F (36.4 C)-99.6 F (37.6 C)] 99.6 F (37.6 C) (10/21 0602) Pulse Rate:  [63-71] 69 (10/21 0602) Resp:  [18] 18 (10/21 0602) BP: (115-168)/(55-89) 168/70 mmHg (10/21 0602) SpO2:  [92 %-97 %] 92 % (10/21 0602) Weight:  [90.402 kg (199 lb 4.8 oz)] 90.402 kg (199 lb 4.8 oz) (10/21 0602) Last BM Date: 11/08/12  Intake/Output from previous day: 10/20 0701 - 10/21 0700 In: -  Out: 750 [Urine:750] Intake/Output this shift:    PE: General--NAD Heart-- Lungs--clear Abdomen--soft minimal tender good BSs  Lab Results:  Recent Labs  11/08/12 0950  WBC 8.9  HGB 11.5*  HCT 35.8*  PLT 194   BMET  Recent Labs  11/08/12 0450  NA 137  K 3.5  CL 102  CO2 27  CREATININE 0.77   LFT  Recent Labs  11/08/12 0950  PROT 6.2  AST 14  ALT 41*  ALKPHOS 125*  BILITOT 0.6  BILIDIR 0.2  IBILI 0.4   PT/INR No results found for this basename: LABPROT, INR,  in the last 72 hours PANCREAS No results found for this basename: LIPASE,  in the last 72 hours       Studies/Results: No results found.  Medications: I have reviewed the patient's current medications.  Assessment/Plan: 1. Nausea. Doubt related to ERCP at this point ? Due to UTI. Will check lipase with next labs.   Giles Currie JR,Murdis Flitton L 11/09/2012, 9:29 AM

## 2012-11-09 NOTE — Progress Notes (Signed)
Received referral from long length of stay meeting today for [facility] Care Managements services. Met with patient at bedside to explain and discuss [facility] Care Management services. Reports she is supposed to discharge home with home health services ([facility]). Explained how [facility] Care Management will not interfere or replace home health services that are provided. Spoke with inpatient RNCM about discussion with patient. Found out patient is also followed by [employer] Case Management telephonically as well. Patient will receive post hospital discharge call and will be evaluated for monthly home visits. Left [facility] Care Management packet at bedside and confirmed best contact number.  Raiford Noble, MSN-Ed, RN,BSN-- [facility] [phone]

## 2012-11-09 NOTE — Progress Notes (Signed)
Inpatient Diabetes Program Recommendations  AACE/ADA: New Consensus Statement on Inpatient Glycemic Control (2013)  Target Ranges:  Prepandial:   less than 140 mg/dL      Peak postprandial:   less than 180 mg/dL (1-2 hours)      Critically ill patients:  140 - 180 mg/dL     Results for Cathy, [name] (MRN [id number]) as of 11/09/2012 07:57  Ref. Range 11/08/2012 07:40 11/08/2012 12:07 11/08/2012 16:12 11/08/2012 19:57  Glucose-Capillary Latest Range: 70-99 mg/dL 409 (H) 811 (H) 914 (H) 173 (H)    Results for Cathy, [name] (MRN [id number]) as of 11/09/2012 07:57  Ref. Range 11/09/2012 07:30  Glucose-Capillary Latest Range: 70-99 mg/dL 086 (H)    **MD- Please consider the following in-hospital insulin adjustments to improve glucose control:  1. Increase Lantus to 65 units QHS (currently on Lantus 60 units QHS) 2. Increase Novolog meal coverage to Novolog 15 units tid with meals (currently on Novolog 12 units tid with meals)   Will follow. Ambrose Finland RN, MSN, CDE Diabetes Coordinator Inpatient Diabetes Program Team Pager: [phone] (8a-10p)

## 2012-11-09 NOTE — Consult Note (Signed)
Urology Consult   Physician requesting consult: Myrtis Ser  Reason for consult: Urinary incontinence and retention  History of Present Illness: Cathy [name] is a 79 y.o. with PMH significant for HTN, CAD s/p CABG and stents, IDDM, and CHF who was admitted on 10/31/12 for eval of chest pain.  She has been extensively evaled and her cardiac status is stable.  Her CP is thought to be due to gallstones and CBD stones for which she underwent an MRCP by GI.  Her sx have improved.  During this hospitalization she was found to have a UTI and she has c/o incontinence with difficulty voiding.  PVRs have shown incomplete bladder emptying.  She has been able to void ~120-400cc with varying PVRs ranging from ~200-400cc.    She has a prolonged hx of mixed urinary incontinence with urge incontinence being the primary issue.  She states this has been going on since the 1970s but has definitely gotten worse over the past few years.  She has frequency, urgency, nocturia, and feelings of incomplete emptying.  When she tries to void she states she "sits for over 15 minutes and it just dribbles".  Her urge incontinence is also dribbling.  She wears Depends daily.  She has had several UTIs per year for many years and thinks that the frequency of these has increased over time as well.  She had a bladder tact and hysterectomy in the 1970s.  She also states she had urethral "polyps" removed in the 1970s with subsequent urethral dilations performed.  She denies a hx of stones and hematuria.   A urine culture on 10/31/12 revealed E coli and this was treated with a 7 day course of Keflex.  A repeat urine culture on 11/07/12 revealed Pseudomonas which is pan sensitive.  She is not currently receiving Abx.    She is currently resting comfortably and her only complaint is of not sleeping well.  She denies F/C, CP, SOB, N/V, abdominal pain, and diarrhea/constipation.    Past Medical History  Diagnosis Date  . Hypertension    Unspecified  . Hyperlipidemia     Mixed, statin intolerance.  [name] CAD (coronary artery disease) 08/21/09    a. CAD s/p CABG 2003. b. Cath 2006: occluded OM vein graft. c. Anterior MI 04/15/12 s/p PTCA only to ramus (stenting unsuccessful at that time) d. NSTEMI 06/2012: failed med rx, s/p DES to ramus c/b vessel perforation tx with balloon/stenting. Plavix hyporesponsive so changed to Brilinta.  . S/P CABG (coronary artery bypass graft) 2003     2003  LIMA, LAD, SVG to the OM, SVG right coronary artery  . Thoracic ascending aortic aneurysm 2003    Ascending thoracic aneurysm repair with a graft at time of CABG  . Diabetes mellitus     Insulin dependent  . Gastroparesis   . Depression   . Allergy history, radiographic dye     Contrast dye allergy  . Previous back surgery   . Syncope 2008    Question?  2008  . Chronic systolic heart failure   . Ischemic cardiomyopathy 06/2011    a. Echo 04/19/12: EF 25-30%, mod LVH, diffuse HK, trivial AI  . Hypotension     October, 2013  . Hypothyroidism     Treated with low-dose Synthroid  . CKD (chronic kidney disease)   . Carotid artery disease     a. 80-99% prox RICA stenosis - with recent cardiac event 06/2012, favor waiting at least 1 month, optimally 3 months post  PCI.  . Hypertriglyceridemia   . Statin intolerance   . Peripheral neuropathy   . Atrial fibrillation     a. Noted during 06/2012 hospitalization. Placed on amiodarone. Plan is for event monitor to assess for breakthrough, hold off anticoag unless additional AF seen.  [name] NSVT (nonsustained ventricular tachycardia)     a. Noted during 06/2012 hospitalization.    Past Surgical History  Procedure Laterality Date  . Coronary artery bypass graft  05/05/2001     LIMA, LAD, SVG to the OM, SVG right coronary artery  . Bladder tacking    . Salivary gland surgery    . Total abdominal hysterectomy    . Back surgery    . Breast surgery    . External ear surgery    . Cardiac catheterization   04/15/12, 07/05/12    60-70% proximal LAD, 95% mid AV groove circumflex s/p PTCA, proximal RCA occlusion, patent LIMA to LAD, patent SVG to distal RCA, chronically occluded SVG to circumflex; LVEF 25-30%  . Ercp N/A 11/04/2012    Procedure: ENDOSCOPIC RETROGRADE CHOLANGIOPANCREATOGRAPHY (ERCP);  Surgeon: Petra Kuba, MD;  Location: [facility] OR;  Service: Endoscopy;  Laterality: N/A;     Current Hospital Medications:  Home meds:    Medication List    ASK your doctor about these medications       acetaminophen 500 MG tablet  Commonly known as:  TYLENOL  Take 500 mg by mouth every 6 (six) hours as needed for pain.     amiodarone 200 MG tablet  Commonly known as:  PACERONE  Take 1 tablet (200 mg total) by mouth 2 (two) times daily.     aspirin EC 81 MG tablet  Take 81 mg by mouth every morning.     aspirin EC 81 MG tablet  Take 324 mg by mouth once.     cetirizine 10 MG tablet  Commonly known as:  ZYRTEC  Take 10 mg by mouth daily.     fluticasone 50 MCG/ACT nasal spray  Commonly known as:  FLONASE  Place 1 spray into the nose daily as needed for allergies.     folic acid 400 MCG tablet  Commonly known as:  FOLVITE  Take 400 mcg by mouth daily.     furosemide 40 MG tablet  Commonly known as:  LASIX  Take 1 tablet (40 mg total) by mouth daily.     insulin glargine 100 UNIT/ML injection  Commonly known as:  LANTUS  Inject 60 Units into the skin at bedtime.     insulin lispro 100 UNIT/ML injection  Commonly known as:  HUMALOG  Inject 12 Units into the skin 3 (three) times daily before meals. Sliding scale as you were doing before admission to the hospital.     isosorbide mononitrate 30 MG 24 hr tablet  Commonly known as:  IMDUR  Take 1 tablet (30 mg total) by mouth every morning.     levothyroxine 25 MCG tablet  Commonly known as:  SYNTHROID, LEVOTHROID  Take 25 mcg by mouth daily before breakfast.     meclizine 25 MG tablet  Commonly known as:  ANTIVERT  Take 25 mg  by mouth 2 (two) times daily.     NITROSTAT 0.4 MG SL tablet  Generic drug:  nitroGLYCERIN  Place 0.4 mg under the tongue every 5 (five) minutes as needed for chest pain. May repeat times three.     promethazine 25 MG tablet  Commonly known as:  PHENERGAN  Take  1 tablet (25 mg total) by mouth every 6 (six) hours as needed for nausea.     ramipril 2.5 MG capsule  Commonly known as:  ALTACE  Take 2.5 mg by mouth daily.        Scheduled Meds: . amiodarone  200 mg Oral Daily  . carvedilol  3.125 mg Oral BID WC  . folic acid  500 mcg Oral Daily  . furosemide  40 mg Oral Daily  . insulin aspart  0-15 Units Subcutaneous TID WC  . insulin aspart  0-5 Units Subcutaneous QHS  . insulin aspart  15 Units Subcutaneous TID WC  . insulin glargine  65 Units Subcutaneous QHS  . isosorbide mononitrate  30 mg Oral q morning - 10a  . levothyroxine  25 mcg Oral QAC breakfast  . loratadine  10 mg Oral Daily  . meclizine  25 mg Oral BID  . polyethylene glycol  17 g Oral BID  . ramipril  2.5 mg Oral Daily   Continuous Infusions:  PRN Meds:.acetaminophen, acetaminophen-codeine, fluticasone, nitroGLYCERIN, ondansetron (ZOFRAN) IV, promethazine  Allergies:  Allergies  Allergen Reactions  . Atorvastatin Other (See Comments)    Unknown-patient admitted to hospital after taking  . Citrus Dermatitis    BREAK OUT ON BODY  . Contrast Media [Iodinated Diagnostic Agents] Other (See Comments)    Passed out  . Iohexol      Desc: ANAPHYLAXIS   . Plavix [Clopidogrel Bisulfate]     Not an allergy - but PRU was drawn 06/2012 indicating Plavix hyporesponsiveness, thus she was changed to Brilinta.  . Zolpidem Tartrate     REACTION: hallucinations  . Milk-Related Compounds Rash    Family History  Problem Relation Age of Onset  . Heart disease Other   . Heart disease Mother   . Hypertension Mother   . Heart disease Father   . Hypertension Father     Social History:  reports that she has never  smoked. She has never used smokeless tobacco. She reports that she drinks about 1.5 ounces of alcohol per week. She reports that she does not use illicit drugs.  ROS: A complete review of systems was performed.  All systems are negative except for pertinent findings as noted.  Physical Exam:  Vital signs in last 24 hours: Temp:  [97.6 F (36.4 C)-99.6 F (37.6 C)] 99.6 F (37.6 C) (10/21 0602) Pulse Rate:  [61-69] 61 (10/21 1032) Resp:  [18] 18 (10/21 0602) BP: (150-168)/(55-76) 168/70 mmHg (10/21 0602) SpO2:  [92 %-95 %] 94 % (10/21 1032) Weight:  [90.402 kg (199 lb 4.8 oz)] 90.402 kg (199 lb 4.8 oz) (10/21 0602) Constitutional:  Alert and oriented, No acute distress Cardiovascular: Regular rate and rhythm Respiratory: Normal respiratory effort,  GI: Abdomen is soft, nontender, nondistended, no abdominal masses GU: No CVA tenderness Lymphatic: No lymphadenopathy Neurologic: Grossly intact, no focal deficits Psychiatric: Normal mood and affect  Laboratory Data:   Recent Labs  11/08/12 0950  WBC 8.9  HGB 11.5*  HCT 35.8*  PLT 194     Recent Labs  11/08/12 0450  NA 137  K 3.5  CL 102  GLUCOSE 236*  BUN 12  CALCIUM 9.1  CREATININE 0.77     Results for orders placed during the hospital encounter of 10/30/12 (from the past 24 hour(s))  GLUCOSE, CAPILLARY     Status: Abnormal   Collection Time    11/08/12  4:12 PM      Result Value Range   Glucose-Capillary  260 (*) 70 - 99 mg/dL   Comment 1 Documented in Chart     Comment 2 Notify RN    GLUCOSE, CAPILLARY     Status: Abnormal   Collection Time    11/08/12  7:57 PM      Result Value Range   Glucose-Capillary 173 (*) 70 - 99 mg/dL  GLUCOSE, CAPILLARY     Status: Abnormal   Collection Time    11/09/12  7:30 AM      Result Value Range   Glucose-Capillary 203 (*) 70 - 99 mg/dL   Comment 1 Notify RN    GLUCOSE, CAPILLARY     Status: Abnormal   Collection Time    11/09/12 11:58 AM      Result Value Range    Glucose-Capillary 164 (*) 70 - 99 mg/dL   Comment 1 Notify RN     Recent Results (from the past 240 hour(s))  URINE CULTURE     Status: None   Collection Time    10/31/12 12:56 AM      Result Value Range Status   Specimen Description URINE, CLEAN CATCH   Final   Special Requests CX ADDED AT 0117 ON [id number]   Final   Culture  Setup Time     Final   Value: 10/31/2012 01:24     Performed at [facility]   Colony Count     Final   Value: >=100,000 COLONIES/ML     Performed at [facility]   Culture     Final   Value: ESCHERICHIA COLI     Performed at [facility]   Report Status 11/02/2012 FINAL   Final   Organism ID, Bacteria ESCHERICHIA COLI   Final  CULTURE, BLOOD (ROUTINE X 2)     Status: None   Collection Time    11/07/12  9:25 AM      Result Value Range Status   Specimen Description BLOOD LEFT ARM   Final   Special Requests BOTTLES DRAWN AEROBIC AND ANAEROBIC 10CC   Final   Culture  Setup Time     Final   Value: 11/07/2012 17:55     Performed at [facility]   Culture     Final   Value:        BLOOD CULTURE RECEIVED NO GROWTH TO DATE CULTURE WILL BE HELD FOR 5 DAYS BEFORE ISSUING A FINAL NEGATIVE REPORT     Performed at [facility]   Report Status PENDING   Incomplete  CULTURE, BLOOD (ROUTINE X 2)     Status: None   Collection Time    11/07/12  9:35 AM      Result Value Range Status   Specimen Description BLOOD LEFT HAND   Final   Special Requests BOTTLES DRAWN AEROBIC ONLY 10CC   Final   Culture  Setup Time     Final   Value: 11/07/2012 17:55     Performed at [facility]   Culture     Final   Value:        BLOOD CULTURE RECEIVED NO GROWTH TO DATE CULTURE WILL BE HELD FOR 5 DAYS BEFORE ISSUING A FINAL NEGATIVE REPORT     Performed at [facility]   Report Status PENDING   Incomplete  URINE CULTURE     Status: None   Collection Time    11/07/12  9:55 AM      Result Value Range Status   Specimen  Description  URINE, CLEAN CATCH   Final   Special Requests PATIENT ON FOLLOWING [facility]   Final   Culture  Setup Time     Final   Value: 11/07/2012 20:40     Performed at [facility] Count     Final   Value: >=100,000 COLONIES/ML     Performed at [facility]   Culture     Final   Value: PSEUDOMONAS AERUGINOSA     Performed at [facility]   Report Status 11/09/2012 FINAL   Final   Organism ID, Bacteria PSEUDOMONAS AERUGINOSA   Final    Renal Function:  Recent Labs  11/04/12 0753 11/05/12 0426 11/06/12 0620 11/08/12 0450  CREATININE 0.91 0.89 0.93 0.77   Estimated Creatinine Clearance: 62.3 ml/min (by C-G formula based on Cr of 0.77).  Radiologic Imaging: EXAM:  MRI ABDOMEN WITHOUT (INCLUDING MRCP)  TECHNIQUE:  Multiplanar multisequence MR imaging of the abdomen was performed.  Heavily T2-weighted images of the biliary and pancreatic ducts were  obtained, and three-dimensional MRCP images were rendered by post  processing.  COMPARISON: Multiple exams, including 11/01/2012 and 10/17/2010  FINDINGS:  Diffuse hepatic steatosis.  Common bile duct dilated to 12 mm, with 10-15 small filling defects  in the distal CBD compatible with stones. The largest measures 7 mm  in long axis. There is borderline intrahepatic biliary dilatation.  Multiple gallstones are subtle dependent Go in the gallbladder and  measure up to 1.3 cm in diameter.  Pancreas divisum noted. No dorsal pancreatic duct dilatation.  Complex 1.3 cm left kidney upper pole cyst with internal fluid-fluid  level. Focal scarring posteriorly in the right mid kidney (versus  highly fatty angiomyolipoma.).  Lumbar spondylosis and degenerative disc disease.  T2 hyperintense but mildly heterogeneous 1.2 cm lesion in segment 6  of the liver, image 27 of series 4, stable in size from 01/23/2009.  0.6 cm T2 hyperintense lesion in segment 4a of the liver. Dedicated  3D acquisitions are obscured by  marked motion artifact.  IMPRESSION:  1. Choledocholithiasis, with over 10 small filling defects in the  distal CBD compatible with stones, largest 7 mm in long axis. CBD  dilated to 12 mm, with borderline intrahepatic biliary dilatation.  2. Multiple gallstones in the gallbladder.  3. Pancreas divisum without dorsal pancreatic duct dilatation.  4. Complex cyst in the left kidney upper pole, similar to 2011, with  internal fluid-fluid level. Scarring versus fatty angiomyolipoma in  the right mid kidney posteriorly.  5. Two T2 hyperintense lesions in the liver are stable from 2011 and  highly likely to be benign cysts or similar benign lesions.   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  Treat the current Pseudomonas UTI with 5 day course of Cipro.  Will start Flomax to improve bladder emptying.  Pt will need to f/u in the office within several days after discharge to recheck urine to ensure infection is resolved as well as set up urodynamics with cystoscopy.  Since she is voiding there is no need to send her home with a foley or teach I/O cathing for now.      Silas Flood 11/09/2012, 2:16 PM

## 2012-11-09 NOTE — Progress Notes (Signed)
Physical Therapy Treatment Patient Details Name: Cathy [name] MRN: [id number] DOB: Mar 13, 1930 Today's Date: 11/09/2012 Time: 5621-3086 PT Time Calculation (min): 21 min  PT Assessment / Plan / Recommendation  History of Present Illness 79 y.o. female admitted to [facility] on 10/31/10 with [place].  Of note, pt with h/o chronic back and neck pain.     PT Comments   Pt seen again twice today to try to build the pt's strength and endurance to d/c home with intermittent supervision.  I believe it is working as pt walked the furthest she has walked here this afternoon.  She is still very deconditioned and has limited activity tolerance and continues to need HHPT at discharge, but I think she is at a level to be safe to go home and function with intermittent supervision.    Follow Up Recommendations  Home health PT;Supervision - Intermittent     Does the patient have the potential to tolerate intense rehabilitation    NA  Barriers to Discharge   Decreased caregiver support      Equipment Recommendations  None recommended by PT    Recommendations for Other Services   NA  Frequency Min 3X/week   Progress towards PT Goals Progress towards PT goals: Progressing toward goals  Plan Current plan remains appropriate    Precautions / Restrictions Precautions Precautions: Fall   Pertinent Vitals/Pain DOE 3/4 at end of gait, O2 sats in the 90s on RA.      Mobility  Bed Mobility Supine to Sit: 6: Modified independent (Device/Increase time);With rails;HOB elevated Sitting - Scoot to Edge of Bed: 6: Modified independent (Device/Increase time);With rail Details for Bed Mobility Assistance: Still pulling on railing for support to get to EOB.  Transfers Sit to Stand: 5: Supervision;With upper extremity assist;With armrests;From bed Stand to Sit: 5: Supervision;With upper extremity assist;With armrests;To chair/3-in-1 Details for Transfer Assistance: supervision for safety and verbal cues for safe hand  placement Ambulation/Gait Ambulation/Gait Assistance: 5: Supervision Ambulation Distance (Feet): 150 Feet Assistive device: Rolling walker Ambulation/Gait Assistance Details: verbal cues to stay close to and inside of RW during gait.  "You are going to have to tie a rope to me for me to remember that!"   Gait Pattern: Step-through pattern;Decreased stride length;Trunk flexed Gait velocity: decreased General Gait Details: Pt walked farther this PM than in the AM session.  Better endurance demonstrated, but still very DOE at the end of her walk.  O2 sats in the 90s despite DOE.        PT Goals (current goals can now be found in the care plan section) Acute Rehab PT Goals Patient Stated Goal: to get stronger, reduce pain  Visit Information  Last PT Received On: 11/09/12 Assistance Needed: +1 History of Present Illness: 79 y.o. female admitted to [facility] on 10/31/10 with [place].  Of note, pt with h/o chronic back and neck pain.      Subjective Data  Subjective: Pt reports that, "I'll be rid of you tomorrow.  I am going home!" Patient Stated Goal: to get stronger, reduce pain   Cognition  Cognition Arousal/Alertness: Awake/alert Behavior During Therapy: WFL for tasks assessed/performed Overall Cognitive Status: Within Functional Limits for tasks assessed Memory: Decreased short-term memory    Balance  Static Sitting Balance Static Sitting - Balance Support: Feet supported Static Sitting - Level of Assistance: 7: Independent Static Standing Balance Static Standing - Balance Support: Bilateral upper extremity supported Static Standing - Level of Assistance: 5: Stand by assistance Dynamic Standing  Balance Dynamic Standing - Balance Support: Bilateral upper extremity supported Dynamic Standing - Level of Assistance: 5: Stand by assistance  End of Session PT - End of Session Activity Tolerance: Patient limited by fatigue Patient left: in chair;with call bell/phone within reach     [place] B. Judy Goodenow, PT, DPT [phone]   11/09/2012, 5:54 PM

## 2012-11-10 DIAGNOSIS — K831 Obstruction of bile duct: Secondary | ICD-10-CM

## 2012-11-10 DIAGNOSIS — R32 Unspecified urinary incontinence: Secondary | ICD-10-CM

## 2012-11-10 LAB — BASIC METABOLIC PANEL
BUN: 14 mg/dL (ref 6–23)
Calcium: 9.2 mg/dL (ref 8.4–10.5)
Chloride: 105 mEq/L (ref 96–112)
Creatinine, Ser: 0.86 mg/dL (ref 0.50–1.10)
GFR calc Af Amer: 72 mL/min — ABNORMAL LOW (ref >90)
Sodium: 143 mEq/L (ref 135–145)

## 2012-11-10 LAB — GLUCOSE, CAPILLARY: Glucose-Capillary: 108 mg/dL — ABNORMAL HIGH (ref 70–99)

## 2012-11-10 LAB — HEPATIC FUNCTION PANEL
Bilirubin, Direct: 0.1 mg/dL (ref 0.0–0.3)
Indirect Bilirubin: 0.3 mg/dL (ref 0.3–0.9)
Total Bilirubin: 0.4 mg/dL (ref 0.3–1.2)

## 2012-11-10 LAB — LIPASE, BLOOD: Lipase: 17 U/L (ref 11–59)

## 2012-11-10 MED ORDER — TAMSULOSIN HCL 0.4 MG PO CAPS: 0.4000 mg | ORAL_CAPSULE | Freq: Every day | ORAL | Status: DC

## 2012-11-10 MED ORDER — POLYETHYLENE GLYCOL 3350 17 G PO PACK: 17.0000 g | PACK | Freq: Two times a day (BID) | ORAL | Status: DC

## 2012-11-10 MED ORDER — INSULIN GLARGINE 100 UNIT/ML ~~LOC~~ SOLN: 65.0000 [IU] | Freq: Every day | SUBCUTANEOUS | Status: DC

## 2012-11-10 MED ORDER — AMIODARONE HCL 200 MG PO TABS: 200.0000 mg | ORAL_TABLET | Freq: Every day | ORAL | Status: DC

## 2012-11-10 MED ORDER — CIPROFLOXACIN HCL 500 MG PO TABS: 500.0000 mg | ORAL_TABLET | Freq: Two times a day (BID) | ORAL | Status: DC

## 2012-11-10 MED ORDER — INSULIN LISPRO 100 UNIT/ML ~~LOC~~ SOLN: 15.0000 [IU] | Freq: Three times a day (TID) | SUBCUTANEOUS | Status: DC

## 2012-11-10 NOTE — Progress Notes (Signed)
NURSING PROGRESS NOTE  Cathy [name] [id number] Discharge Data: 11/10/2012 11:41 AM Attending Provider: Duke Salvia, MD [id number], Kingsley Spittle, MD     Vella Kohler to be D/C'd Home per MD order.  Discussed with the patient and grandson the After Visit Summary and all questions fully answered. All IV's discontinued with no bleeding noted. Telemetry discontinued. All belongings returned to patient for patient to take home.   Last Vital Signs:  Blood pressure 124/68, pulse 74, temperature 98.4 F (36.9 C), temperature source Oral, resp. rate 18, height 5\' 5"  (1.651 m), weight 90.1 kg (198 lb 10.2 oz), SpO2 92.00%.  Discharge Medication List   Medication List         acetaminophen 500 MG tablet  Commonly known as:  TYLENOL  Take 500 mg by mouth every 6 (six) hours as needed for pain.     amiodarone 200 MG tablet  Commonly known as:  PACERONE  Take 1 tablet (200 mg total) by mouth daily.     aspirin EC 81 MG tablet  Take 81 mg by mouth every morning.     cetirizine 10 MG tablet  Commonly known as:  ZYRTEC  Take 10 mg by mouth daily.     ciprofloxacin 500 MG tablet  Commonly known as:  CIPRO  Take 1 tablet (500 mg total) by mouth 2 (two) times daily.     fluticasone 50 MCG/ACT nasal spray  Commonly known as:  FLONASE  Place 1 spray into the nose daily as needed for allergies.     folic acid 400 MCG tablet  Commonly known as:  FOLVITE  Take 400 mcg by mouth daily.     furosemide 40 MG tablet  Commonly known as:  LASIX  Take 1 tablet (40 mg total) by mouth daily.     insulin glargine 100 UNIT/ML injection  Commonly known as:  LANTUS  Inject 0.65 mLs (65 Units total) into the skin at bedtime.     insulin lispro 100 UNIT/ML injection  Commonly known as:  HUMALOG  Inject 15 Units into the skin 3 (three) times daily before meals. Sliding scale as you were doing before admission to the hospital.     isosorbide mononitrate 30 MG 24 hr tablet  Commonly known as:  IMDUR   Take 1 tablet (30 mg total) by mouth every morning.     levothyroxine 25 MCG tablet  Commonly known as:  SYNTHROID, LEVOTHROID  Take 25 mcg by mouth daily before breakfast.     meclizine 25 MG tablet  Commonly known as:  ANTIVERT  Take 25 mg by mouth 2 (two) times daily.     NITROSTAT 0.4 MG SL tablet  Generic drug:  nitroGLYCERIN  Place 0.4 mg under the tongue every 5 (five) minutes as needed for chest pain. May repeat times three.     polyethylene glycol packet  Commonly known as:  MIRALAX / GLYCOLAX  Take 17 g by mouth 2 (two) times daily. Hold for constipation     promethazine 25 MG tablet  Commonly known as:  PHENERGAN  Take 1 tablet (25 mg total) by mouth every 6 (six) hours as needed for nausea.     ramipril 2.5 MG capsule  Commonly known as:  ALTACE  Take 2.5 mg by mouth daily.     tamsulosin 0.4 MG Caps capsule  Commonly known as:  FLOMAX  Take 1 capsule (0.4 mg total) by mouth daily.

## 2012-11-10 NOTE — Discharge Summary (Signed)
CARDIOLOGY DISCHARGE SUMMARY   Patient ID: LANISA ISHLER MRN: [id number] DOB/AGE: 24-Jul-1930 79 y.o.  Admit date: 10/30/2012 Discharge date: 11/10/2012  Primary Discharge Diagnosis:    Unstable angina Secondary Discharge Diagnosis:    Hypertension   Hyperlipidemia   Allergy history, radiographic dye   Statin intolerance   DM (diabetes mellitus), type 2 with neurological complications   Hypothyroidism   CKD (chronic kidney disease)   Carotid artery disease   CAD (coronary artery disease)   Chronic systolic CHF (congestive heart failure)   Atrial fibrillation   NSVT (nonsustained ventricular tachycardia)   Ischemic cardiomyopathy   Elevated LFTs   Cholelithiasis   Common bile duct dilatation   UTI (urinary tract infection)   H/O amiodarone therapy   Common bile duct (CBD) obstruction   Fatigue   Lower abdominal pain   Fever   Diarrhea   Overflow incontinence   Urinary incontinence   Consults: Gastroenterology, urology  Procedures: ERCP, MR of the abdomen, Lexi scan Cardiolite, abdominal ultrasound  Hospital Course: RACHELLA BASDEN is a 79 y.o. female with a history of CAD. She had chest pain consistent with her previous angina that improved with nitroglycerin. She came to the hospital and was admitted for further evaluation and treatment.  1. Unstable anginal pain/CAD: Her cardiac enzymes were negative for MI. She has been significant coronary artery disease and was treated medically. A Lexi scan Cardiolite was performed on 11/01/2012. Results are below. She had areas of scarring consistent with previous infarcts but no clinically significant ischemia and her EF was 41%..  2. atrial fibrillation: She was in sinus rhythm on admission. She had been on amiodarone to help her maintain sinus rhythm prior to admission. However, she had elevated liver function tests and her amiodarone dose was decreased. She continued to maintain sinus rhythm. Once the gallstones were  removed, her LFTs improved. Her discharge dose of amiodarone we'll be 200 mg daily and this will be followed as an outpatient.  3. abnormal hepatic function testing: She was noted to have abnormal LFTs. An ultrasound showed cholelithiasis but no cholecystitis. However, her common bile duct was dilated. Dr. Myrtis Ser reviewed all data and felt a GI evaluation was indicated. She was seen by Dr.Magod who recommended MRCP. The patient preferred to get an MRI first. The results were below. The MRI showed multiple stones. The results were reviewed and she was felt stable for an MRCP. This was performed on 11/04/2012. Multiple stones and fragments were pulled through. This was performed under general anesthesia. She tolerated the procedure well. Just after the MRCP, she had problems with epigastric pain associated with nausea and vomiting. Her bilirubin increased to 3.4. Dr. Ewing Schlein felt this was secondary to duct edema post-procedure. Her diet was slowly advanced. She was treated symptomatically, and gradually improved. Several days after the MRCP, her LFTs normalized. She continued to have problems with nausea but this is possibly secondary to a concurrent urinary tract infection. Her lipase was checked and was normal.  4. UTI: She had UTI symptoms. A urinalysis and blood cultures as well as a urine culture were performed. The first urine culture was E. Coli, sensitive to Keflex, which he completed. The second urine culture was Pseud aeruginosa, sensitive to Cipro. She will complete Cipro as an outpatient. Blood cultures are negative so far, but pending at the time of dictation.   5. Overflow Incontinence: It was felt this was contributing to her frequent UTIs. A urology consult was called.  She was noted to have urinary retention, urge incontinence and a UTI, all felt related to each other. It was recommended that she complete a five-day course of Cipro and also started on Flomax. She is to followup in the office with the  urologist after discharge. She will also need a cystoscopy to assess her urodynamics.  6. Diabetes: Ms. Difrancesco was seen by the diabetes management team. Some medication changes were recommended and these were implemented.  7. Chronic kidney disease: Her kidney function was followed closely during her hospital stay. At discharge, her BUN and creatinine are baseline.  8. Fatigue/deconditioning: The patient has  Had a prolonged hospitalization. She was feeling weak and fatigued prior to admission and this became worse in the hospital. She was seen by physical therapy and occupational therapy. Skilled nursing facility placement was recommended but the patient adamantly refused this. She will go home with home health.   9. hypothyroid: Her TSH was checked and was within normal limits. No dose change was indicated for her Synthroid.  10. Carotid artery disease: Dr. Myrtis Ser is aware of Ms. Sherrer's severe carotid disease. He will follow her carefully and if she stabilizes, we'll get an elective carotid endarterectomy.  By 11/10/2012, Ms. Spoon was beginning to get stronger and feeling some better. She was adamantly refusing skilled nursing facility placement. She has family assistance and will get a home health nurse, a and physical therapy. Dr. Myrtis Ser evaluated Ms. Delton See and reviewed all data. He did not feel any further inpatient workup was indicated. He considers her stable for discharge, in improved condition, to follow up as an outpatient.   Labs:   Lab Results  Component Value Date   WBC 8.9 11/08/2012   HGB 11.5* 11/08/2012   HCT 35.8* 11/08/2012   MCV 93.5 11/08/2012   PLT 194 11/08/2012     Recent Labs Lab 11/10/12 0534  NA 143  K 3.6  CL 105  CO2 29  BUN 14  CREATININE 0.86  CALCIUM 9.2  PROT 6.3  BILITOT 0.4  ALKPHOS 116  ALT 25  AST 15  GLUCOSE 116*    Lipid Panel     Component Value Date/Time   CHOL 188 06/27/2012 0515   TRIG 351* 06/27/2012 0515   HDL 32* 06/27/2012  0515   CHOLHDL 5.9 06/27/2012 0515   VLDL 70* 06/27/2012 0515   LDLCALC 86 06/27/2012 0515    Pro B Natriuretic peptide (BNP)  Date/Time Value Range Status  10/24/2011  4:21 PM 685.8* 0 - 450 pg/mL Final   Lab Results  Component Value Date   TSH 0.629 10/31/2012   Lab Results  Component Value Date   HGBA1C 8.3* 10/31/2012      Radiology: US Abdomen Complete 11/01/2012   CLINICAL DATA:  Epigastric abdominal pain. Diabetes. Chronic kidney disease.  EXAM: ULTRASOUND ABDOMEN COMPLETE  COMPARISON:  None.  FINDINGS: Gallbladder  Multiple tiny gallstones and gallbladder sludge noted. No evidence of gallbladder wall thickening. No definite sonographic Murphy sign noted.  Common bile duct  Diameter: 10 mm in diameter.  Liver  Diffusely increased echogenicity of the hepatic parenchyma, consistent with hepatic steatosis. No focal mass lesion identified.  IVC  No abnormality visualized.  Pancreas  Visualized portion unremarkable.  Spleen  Size and appearance within normal limits.  Right Kidney  Length: 11.7 cm. Echogenicity within normal limits. No mass or hydronephrosis visualized.  Left Kidney  Length: 12.7 cm. Echogenicity within normal limits. Tiny cyst noted in upper pole. No mass  or hydronephrosis visualized.  Abdominal aorta  No aneurysm visualized.  IMPRESSION: Cholelithiasis. No definite sonographic signs of acute cholecystitis.  Dilated common bile duct measuring 10 mm. Etiology not apparent by ultrasound. Consider nonemergent MRCP for further evaluation.  Hepatic steatosis.   Electronically Signed   By: Myles Rosenthal M.D.   On: 11/01/2012 21:48   [name] 11/03/2012   CLINICAL DATA:  Elevated liver function tests. Atypical chest pain. Biliary dilatation.  EXAM: MRI ABDOMEN WITHOUT  (INCLUDING MRCP)  TECHNIQUE: Multiplanar multisequence MR imaging of the abdomen was performed. Heavily T2-weighted images of the biliary and pancreatic ducts were obtained, and three-dimensional MRCP images were  rendered by post processing.  COMPARISON:  Multiple exams, including 11/01/2012 and 10/17/2010  FINDINGS: Diffuse hepatic steatosis.  Common bile duct dilated to 12 mm, with 10-15 small filling defects in the distal CBD compatible with stones. The largest measures 7 mm in long axis. There is borderline intrahepatic biliary dilatation.  Multiple gallstones are subtle dependent Go in the gallbladder and measure up to 1.3 cm in diameter.  Pancreas divisum noted. No dorsal pancreatic duct dilatation.  Complex 1.3 cm left kidney upper pole cyst with internal fluid-fluid level. Focal scarring posteriorly in the right mid kidney (versus highly fatty angiomyolipoma.).  Lumbar spondylosis and degenerative disc disease.  T2 hyperintense but mildly heterogeneous 1.2 cm lesion in segment 6 of the liver, image 27 of series 4, stable in size from 01/23/2009. 0.6 cm T2 hyperintense lesion in segment 4a of the liver. Dedicated 3D acquisitions are obscured by marked motion artifact.  IMPRESSION: 1. Choledocholithiasis, with over 10 small filling defects in the distal CBD compatible with stones, largest 7 mm in long axis. CBD dilated to 12 mm, with borderline intrahepatic biliary dilatation. 2. Multiple gallstones in the gallbladder. 3. Pancreas divisum without dorsal pancreatic duct dilatation. 4. Complex cyst in the left kidney upper pole, similar to 2011, with internal fluid-fluid level. 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 5. Two T2 hyperintense lesions in the liver are stable from 2011 and highly likely to be benign cysts or similar benign lesions.   Electronically Signed   By: Herbie Baltimore M.D.   On: 11/03/2012 09:23   Mr 3d Recon At Scanner 11/03/2012   CLINICAL DATA:  Elevated liver function tests. Atypical chest pain. Biliary dilatation.  EXAM: MRI ABDOMEN WITHOUT  (INCLUDING MRCP)  TECHNIQUE: Multiplanar multisequence MR imaging of the abdomen was performed. Heavily T2-weighted  images of the biliary and pancreatic ducts were obtained, and three-dimensional MRCP images were rendered by post processing.  COMPARISON:  Multiple exams, including 11/01/2012 and 10/17/2010  FINDINGS: Diffuse hepatic steatosis.  Common bile duct dilated to 12 mm, with 10-15 small filling defects in the distal CBD compatible with stones. The largest measures 7 mm in long axis. There is borderline intrahepatic biliary dilatation.  Multiple gallstones are subtle dependent Go in the gallbladder and measure up to 1.3 cm in diameter.  Pancreas divisum noted. No dorsal pancreatic duct dilatation.  Complex 1.3 cm left kidney upper pole cyst with internal fluid-fluid level. Focal scarring posteriorly in the right mid kidney (versus highly fatty angiomyolipoma.).  Lumbar spondylosis and degenerative disc disease.  T2 hyperintense but mildly heterogeneous 1.2 cm lesion in segment 6 of the liver, image 27 of series 4, stable in size from 01/23/2009. 0.6 cm T2 hyperintense lesion in segment 4a of the liver. Dedicated 3D acquisitions are obscured by marked motion artifact.  IMPRESSION:  1. Choledocholithiasis, with over 10 small filling defects in the distal CBD compatible with stones, largest 7 mm in long axis. CBD dilated to 12 mm, with borderline intrahepatic biliary dilatation. 2. Multiple gallstones in the gallbladder. 3. Pancreas divisum without dorsal pancreatic duct dilatation. 4. Complex cyst in the left kidney upper pole, similar to 2011, with internal fluid-fluid level. 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 5. Two T2 hyperintense lesions in the liver are stable from 2011 and highly likely to be benign cysts or similar benign lesions.   Electronically Signed   By: Herbie Baltimore M.D.   On: 11/03/2012 09:23   Nm Myocar Multi W/spect W/wall Motion / Ef 11/01/2012   *RADIOLOGY REPORT*  Clinical Data:  Chest pain, history of CAD, atrial fibrillation, congestive heart failure,  ischemic cardiomyopathy, diabetes, hyperlipidemia  MYOCARDIAL IMAGING WITH SPECT (REST AND PHARMACOLOGIC-STRESS) GATED LEFT VENTRICULAR WALL MOTION STUDY LEFT VENTRICULAR EJECTION FRACTION  Technique:  Standard myocardial SPECT imaging was performed after resting intravenous injection of 10 mCi Tc-65m sestamibi. Subsequently, intravenous infusion of lexiscan was performed under the supervision of the Cardiology staff.  At peak effect of the drug, 30 mCi Tc-35m sestamibi was injected intravenously and standard myocardial SPECT  imaging was performed.  Quantitative gated imaging was also performed to evaluate left ventricular wall motion, and estimate left ventricular ejection fraction.  Comparison:  Chest radiograph - 10/31/2012  Findings:  Review of the rotational raw images demonstrates significant GI activity, worse on the provided the stress images comparison to the rest.  There is no significant patient motion artifact.  A minimal amount of radiotracer is seen within the right upper extremity IV site.  SPECT imaging demonstrates mild dilatation of the left ventricular cavity.  There is a grossly matched moderate area of non perfusion involving the inferior wall of the left ventricle suggestive of prior infarction.  There are additional areas of matched non perfusion involving the apical aspect of the left ventricular septum as well as the basilar aspect of the lateral wall left ventricle, also worrisome for additional areas of prior infarction. Given the suspected areas of prior infarction, there is no definitive scintigraphic evidence of pharmacologically induced ischemia.  Quantitative gated analysis shows geographic areas of hypokinesia/akinesia involving the areas of suspected prior infarction.  The resting left ventricular ejection fraction is 41% with end- diastolic volume of 132 ml and end-systolic volume of 78 ml.  IMPRESSION: 1.  Findings worrisome for prior infarctions involving the inferior wall,  apical aspect of the septum and basilar aspect of the lateral wall.  Given background of suspected prior infarctions, there is no definitive scintigraphic evidence of pharmacologically induced ischemia. 2.  Mildly dilated left ventricle with geographic areas of hypokinesia/akinesia involving the suspected areas of prior infarction as detailed above.  Ejection fraction - 41%.   Original Report Authenticated By: Tacey Ruiz, MD   Dg Chest Port 1 View 10/31/2012   *RADIOLOGY REPORT*  Clinical Data: Chest pain  PORTABLE CHEST - 1 VIEW  Comparison: Prior radiograph from 06/26/2012  Findings: Median sternotomy wires with underlying CABG markers are unchanged.  Cardiomegaly is stable.  The lungs are normally inflated.  There is mild central pulmonary vascular congestion without frank pulmonary edema.  No focal infiltrate is identified.  No pneumothorax or pleural effusion.  Osseous structures are unchanged.  Extensive degenerative changes are noted about both shoulders.  IMPRESSION: Cardiomegaly with mild pulmonary vascular congestion.  No frank pulmonary edema or focal infiltrate identified.  Original Report Authenticated By: Rise Mu, M.D.   Dg Ercp With Sphincterotomy 11/04/2012   CLINICAL DATA:  Choledocholithiasis.  EXAM: ERCP performed by Dr. Ewing Schlein.  TECHNIQUE: Multiple spot images obtained with the fluoroscopic device and submitted for interpretation post-procedure.  COMPARISON:  MRCP dated 11/02/2012  FINDINGS: Three spot films from the ERCP are demonstrated. There are multiple stones in the common bile duct. The last radiograph demonstrates no stones in the visualized portion of the duct.  IMPRESSION: Choledocholithiasis, with stone extraction.  These images were submitted for radiologic interpretation only. Please see the procedural report for the amount of contrast and the fluoroscopy time utilized.   Electronically Signed   By: Geanie Cooley M.D.   On: 11/04/2012 14:50   EKG: SR, no acute  ischemic changes  FOLLOW UP PLANS AND APPOINTMENTS Allergies  Allergen Reactions  . Atorvastatin Other (See Comments)    Unknown-patient admitted to hospital after taking  . Citrus Dermatitis    BREAK OUT ON BODY  . Contrast Media [Iodinated Diagnostic Agents] Other (See Comments)    Passed out  . Iohexol      Desc: ANAPHYLAXIS   . Plavix [Clopidogrel Bisulfate]     Not an allergy - but PRU was drawn 06/2012 indicating Plavix hyporesponsiveness, thus she was changed to Brilinta.  . Zolpidem Tartrate     REACTION: hallucinations  . Milk-Related Compounds Rash     Medication List         acetaminophen 500 MG tablet  Commonly known as:  TYLENOL  Take 500 mg by mouth every 6 (six) hours as needed for pain.     amiodarone 200 MG tablet  Commonly known as:  PACERONE  Take 1 tablet (200 mg total) by mouth daily.     aspirin EC 81 MG tablet  Take 81 mg by mouth every morning.     cetirizine 10 MG tablet  Commonly known as:  ZYRTEC  Take 10 mg by mouth daily.     ciprofloxacin 500 MG tablet  Commonly known as:  CIPRO  Take 1 tablet (500 mg total) by mouth 2 (two) times daily.     fluticasone 50 MCG/ACT nasal spray  Commonly known as:  FLONASE  Place 1 spray into the nose daily as needed for allergies.     folic acid 400 MCG tablet  Commonly known as:  FOLVITE  Take 400 mcg by mouth daily.     furosemide 40 MG tablet  Commonly known as:  LASIX  Take 1 tablet (40 mg total) by mouth daily.     insulin glargine 100 UNIT/ML injection  Commonly known as:  LANTUS  Inject 0.65 mLs (65 Units total) into the skin at bedtime.     insulin lispro 100 UNIT/ML injection  Commonly known as:  HUMALOG  Inject 15 Units into the skin 3 (three) times daily before meals. Sliding scale as you were doing before admission to the hospital.     isosorbide mononitrate 30 MG 24 hr tablet  Commonly known as:  IMDUR  Take 1 tablet (30 mg total) by mouth every morning.     levothyroxine 25  MCG tablet  Commonly known as:  SYNTHROID, LEVOTHROID  Take 25 mcg by mouth daily before breakfast.     meclizine 25 MG tablet  Commonly known as:  ANTIVERT  Take 25 mg by mouth 2 (two) times daily.     NITROSTAT 0.4 MG SL tablet  Generic drug:  nitroGLYCERIN  Place 0.4 mg  under the tongue every 5 (five) minutes as needed for chest pain. May repeat times three.     polyethylene glycol packet  Commonly known as:  MIRALAX / GLYCOLAX  Take 17 g by mouth 2 (two) times daily. Hold for constipation     promethazine 25 MG tablet  Commonly known as:  PHENERGAN  Take 1 tablet (25 mg total) by mouth every 6 (six) hours as needed for nausea.     ramipril 2.5 MG capsule  Commonly known as:  ALTACE  Take 2.5 mg by mouth daily.     tamsulosin 0.4 MG Caps capsule  Commonly known as:  FLOMAX  Take 1 capsule (0.4 mg total) by mouth daily.        Discharge Orders   Future Appointments Provider Department Dept Phone   11/15/2012 1:30 PM Luis Abed, MD [facility] [phone]   Future Orders Complete By Expires   (HEART FAILURE PATIENTS) Call MD:  Anytime you have any of the following symptoms: 1) 3 pound weight gain in 24 hours or 5 pounds in 1 week 2) shortness of breath, with or without a dry hacking cough 3) swelling in the hands, feet or stomach 4) if you have to sleep on extra pillows at night in order to breathe.  As directed    Diet - low sodium heart healthy  As directed    Diet Carb Modified  As directed    Increase activity slowly  As directed      Follow-up Information   Schedule an appointment as soon as possible for a visit with Valetta Fuller, MD.   Specialty:  Urology   Contact information:   [address] [place], [address]                         [place] [address] [phone]       Follow up with [name], MD On 11/15/2012. (at 1:30 pm)    Specialty:  Cardiology   Contact information:   [address]       BRING ALL MEDICATIONS WITH YOU TO FOLLOW UP APPOINTMENTS  Time spent with patient to include physician time:  55 min Signed: Theodore Demark, PA-C 11/10/2012, 11:24 AM Patient seen and examined. I agree with the assessment and plan as detailed above. See also my additional thoughts below.   Please refer also to my very complete progress note from today. We have spent an extensive amount of time trying to solve all of the patient's problems and provide post hospital care arrangements. I made the decision for her to go home. I agree with all the plans made above.  [name], MD, [facility] 11/10/2012 2:47 PM

## 2012-11-10 NOTE — Progress Notes (Signed)
Case Manager did touch base with the [facility] Case Manager  Arna Snipe [phone] x [phone] to make her aware that pt is being d/c today with [facility] services. Transportation was addressed by CSW and the SW that visits to home can assist also. Pt has samples of brilinta to carry home. [facility] will also f/u. No further needs from CM at this time. Gala Lewandowsky, RN,BSN [phone]

## 2012-11-10 NOTE — Progress Notes (Signed)
Patient ID: Cathy [name], female   DOB: 1930/02/03, 79 y.o.   MRN: [id number]    SUBJECTIVE:    Patient is feeling better today. I appreciate the help from urology. Patient is now stable enough to go home. Liver function studies have stabilize. Abdominal pain is improved.   Filed Vitals:   11/09/12 1508 11/09/12 1749 11/09/12 2048 11/10/12 0646  BP: 134/43  152/34 148/46  Pulse: 65  89 62  Temp: 99.3 F (37.4 C)  98.5 F (36.9 C) 98.4 F (36.9 C)  TempSrc: Oral  Oral Oral  Resp: 16   18  Height:      Weight:    198 lb 10.2 oz (90.1 kg)  SpO2: 92% 94% 94% 92%     Intake/Output Summary (Last 24 hours) at 11/10/12 0819 Last data filed at 11/09/12 1700  Gross per 24 hour  Intake    480 ml  Output   1000 ml  Net   -520 ml    LABS: Basic Metabolic Panel:  Recent Labs  40/98/11 0450 11/10/12 0534  NA 137 143  K 3.5 3.6  CL 102 105  CO2 27 29  GLUCOSE 236* 116*  BUN 12 14  CREATININE 0.77 0.86  CALCIUM 9.1 9.2   Liver Function Tests:  Recent Labs  11/08/12 0950 11/10/12 0534  AST 14 15  ALT 41* 25  ALKPHOS 125* 116  BILITOT 0.6 0.4  PROT 6.2 6.3  ALBUMIN 2.3* 2.3*    Recent Labs  11/10/12 0534  LIPASE 17   CBC:  Recent Labs  11/08/12 0950  WBC 8.9  NEUTROABS 5.2  HGB 11.5*  HCT 35.8*  MCV 93.5  PLT 194   Cardiac Enzymes: No results found for this basename: CKTOTAL, CKMB, CKMBINDEX, TROPONINI,  in the last 72 hours BNP: No components found with this basename: POCBNP,  D-Dimer: No results found for this basename: DDIMER,  in the last 72 hours Hemoglobin A1C: No results found for this basename: HGBA1C,  in the last 72 hours Fasting Lipid Panel: No results found for this basename: CHOL, HDL, LDLCALC, TRIG, CHOLHDL, LDLDIRECT,  in the last 72 hours Thyroid Function Tests: No results found for this basename: TSH, T4TOTAL, FREET3, T3FREE, THYROIDAB,  in the last 72 hours  RADIOLOGY: US Abdomen Complete  11/01/2012   CLINICAL DATA:   Epigastric abdominal pain. Diabetes. Chronic kidney disease.  EXAM: ULTRASOUND ABDOMEN COMPLETE  COMPARISON:  None.  FINDINGS: Gallbladder  Multiple tiny gallstones and gallbladder sludge noted. No evidence of gallbladder wall thickening. No definite sonographic Murphy sign noted.  Common bile duct  Diameter: 10 mm in diameter.  Liver  Diffusely increased echogenicity of the hepatic parenchyma, consistent with hepatic steatosis. No focal mass lesion identified.  IVC  No abnormality visualized.  Pancreas  Visualized portion unremarkable.  Spleen  Size and appearance within normal limits.  Right Kidney  Length: 11.7 cm. Echogenicity within normal limits. No mass or hydronephrosis visualized.  Left Kidney  Length: 12.7 cm. Echogenicity within normal limits. Tiny cyst noted in upper pole. No mass or hydronephrosis visualized.  Abdominal aorta  No aneurysm visualized.  IMPRESSION: Cholelithiasis. No definite sonographic signs of acute cholecystitis.  Dilated common bile duct measuring 10 mm. Etiology not apparent by ultrasound. Consider nonemergent MRCP for further evaluation.  Hepatic steatosis.   Electronically Signed   By: Myles Rosenthal M.D.   On: 11/01/2012 21:48   [name]  11/03/2012   CLINICAL DATA:  Elevated  liver function tests. Atypical chest pain. Biliary dilatation.  EXAM: MRI ABDOMEN WITHOUT  (INCLUDING MRCP)  TECHNIQUE: Multiplanar multisequence MR imaging of the abdomen was performed. Heavily T2-weighted images of the biliary and pancreatic ducts were obtained, and three-dimensional MRCP images were rendered by post processing.  COMPARISON:  Multiple exams, including 11/01/2012 and 10/17/2010  FINDINGS: Diffuse hepatic steatosis.  Common bile duct dilated to 12 mm, with 10-15 small filling defects in the distal CBD compatible with stones. The largest measures 7 mm in long axis. There is borderline intrahepatic biliary dilatation.  Multiple gallstones are subtle dependent Go in the  gallbladder and measure up to 1.3 cm in diameter.  Pancreas divisum noted. No dorsal pancreatic duct dilatation.  Complex 1.3 cm left kidney upper pole cyst with internal fluid-fluid level. Focal scarring posteriorly in the right mid kidney (versus highly fatty angiomyolipoma.).  Lumbar spondylosis and degenerative disc disease.  T2 hyperintense but mildly heterogeneous 1.2 cm lesion in segment 6 of the liver, image 27 of series 4, stable in size from 01/23/2009. 0.6 cm T2 hyperintense lesion in segment 4a of the liver. Dedicated 3D acquisitions are obscured by marked motion artifact.  IMPRESSION: 1. Choledocholithiasis, with over 10 small filling defects in the distal CBD compatible with stones, largest 7 mm in long axis. CBD dilated to 12 mm, with borderline intrahepatic biliary dilatation. 2. Multiple gallstones in the gallbladder. 3. Pancreas divisum without dorsal pancreatic duct dilatation. 4. Complex cyst in the left kidney upper pole, similar to 2011, with internal fluid-fluid level. 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 5. Two T2 hyperintense lesions in the liver are stable from 2011 and highly likely to be benign cysts or similar benign lesions.   Electronically Signed   By: Herbie Baltimore M.D.   On: 11/03/2012 09:23   Mr 3d Recon At Scanner  11/03/2012   CLINICAL DATA:  Elevated liver function tests. Atypical chest pain. Biliary dilatation.  EXAM: MRI ABDOMEN WITHOUT  (INCLUDING MRCP)  TECHNIQUE: Multiplanar multisequence MR imaging of the abdomen was performed. Heavily T2-weighted images of the biliary and pancreatic ducts were obtained, and three-dimensional MRCP images were rendered by post processing.  COMPARISON:  Multiple exams, including 11/01/2012 and 10/17/2010  FINDINGS: Diffuse hepatic steatosis.  Common bile duct dilated to 12 mm, with 10-15 small filling defects in the distal CBD compatible with stones. The largest measures 7 mm in long axis. There is  borderline intrahepatic biliary dilatation.  Multiple gallstones are subtle dependent Go in the gallbladder and measure up to 1.3 cm in diameter.  Pancreas divisum noted. No dorsal pancreatic duct dilatation.  Complex 1.3 cm left kidney upper pole cyst with internal fluid-fluid level. Focal scarring posteriorly in the right mid kidney (versus highly fatty angiomyolipoma.).  Lumbar spondylosis and degenerative disc disease.  T2 hyperintense but mildly heterogeneous 1.2 cm lesion in segment 6 of the liver, image 27 of series 4, stable in size from 01/23/2009. 0.6 cm T2 hyperintense lesion in segment 4a of the liver. Dedicated 3D acquisitions are obscured by marked motion artifact.  IMPRESSION: 1. Choledocholithiasis, with over 10 small filling defects in the distal CBD compatible with stones, largest 7 mm in long axis. CBD dilated to 12 mm, with borderline intrahepatic biliary dilatation. 2. Multiple gallstones in the gallbladder. 3. Pancreas divisum without dorsal pancreatic duct dilatation. 4. Complex cyst in the left kidney upper pole, similar to 2011, with internal fluid-fluid level. 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  5. Two T2 hyperintense lesions in the liver are stable from 2011 and highly likely to be benign cysts or similar benign lesions.   Electronically Signed   By: Herbie Baltimore M.D.   On: 11/03/2012 09:23   Nm Myocar Multi W/spect W/wall Motion / Ef  11/01/2012   *RADIOLOGY REPORT*  Clinical Data:  Chest pain, history of CAD, atrial fibrillation, congestive heart failure, ischemic cardiomyopathy, diabetes, hyperlipidemia  MYOCARDIAL IMAGING WITH SPECT (REST AND PHARMACOLOGIC-STRESS) GATED LEFT VENTRICULAR WALL MOTION STUDY LEFT VENTRICULAR EJECTION FRACTION  Technique:  Standard myocardial SPECT imaging was performed after resting intravenous injection of 10 mCi Tc-58m sestamibi. Subsequently, intravenous infusion of lexiscan was performed under the  supervision of the Cardiology staff.  At peak effect of the drug, 30 mCi Tc-95m sestamibi was injected intravenously and standard myocardial SPECT  imaging was performed.  Quantitative gated imaging was also performed to evaluate left ventricular wall motion, and estimate left ventricular ejection fraction.  Comparison:  Chest radiograph - 10/31/2012  Findings:  Review of the rotational raw images demonstrates significant GI activity, worse on the provided the stress images comparison to the rest.  There is no significant patient motion artifact.  A minimal amount of radiotracer is seen within the right upper extremity IV site.  SPECT imaging demonstrates mild dilatation of the left ventricular cavity.  There is a grossly matched moderate area of non perfusion involving the inferior wall of the left ventricle suggestive of prior infarction.  There are additional areas of matched non perfusion involving the apical aspect of the left ventricular septum as well as the basilar aspect of the lateral wall left ventricle, also worrisome for additional areas of prior infarction. Given the suspected areas of prior infarction, there is no definitive scintigraphic evidence of pharmacologically induced ischemia.  Quantitative gated analysis shows geographic areas of hypokinesia/akinesia involving the areas of suspected prior infarction.  The resting left ventricular ejection fraction is 41% with end- diastolic volume of 132 ml and end-systolic volume of 78 ml.  IMPRESSION: 1.  Findings worrisome for prior infarctions involving the inferior wall, apical aspect of the septum and basilar aspect of the lateral wall.  Given background of suspected prior infarctions, there is no definitive scintigraphic evidence of pharmacologically induced ischemia. 2.  Mildly dilated left ventricle with geographic areas of hypokinesia/akinesia involving the suspected areas of prior infarction as detailed above.  Ejection fraction - 41%.   Original  Report Authenticated By: Tacey Ruiz, MD   Dg Chest Port 1 View  10/31/2012   *RADIOLOGY REPORT*  Clinical Data: Chest pain  PORTABLE CHEST - 1 VIEW  Comparison: Prior radiograph from 06/26/2012  Findings: Median sternotomy wires with underlying CABG markers are unchanged.  Cardiomegaly is stable.  The lungs are normally inflated.  There is mild central pulmonary vascular congestion without frank pulmonary edema.  No focal infiltrate is identified.  No pneumothorax or pleural effusion.  Osseous structures are unchanged.  Extensive degenerative changes are noted about both shoulders.  IMPRESSION: Cardiomegaly with mild pulmonary vascular congestion.  No frank pulmonary edema or focal infiltrate identified.   Original Report Authenticated By: Rise Mu, M.D.   Dg Ercp With Sphincterotomy  11/04/2012   CLINICAL DATA:  Choledocholithiasis.  EXAM: ERCP performed by Dr. Ewing Schlein.  TECHNIQUE: Multiple spot images obtained with the fluoroscopic device and submitted for interpretation post-procedure.  COMPARISON:  MRCP dated 11/02/2012  FINDINGS: Three spot films from the ERCP are demonstrated. There are multiple stones in the common bile  duct. The last radiograph demonstrates no stones in the visualized portion of the duct.  IMPRESSION: Choledocholithiasis, with stone extraction.  These images were submitted for radiologic interpretation only. Please see the procedural report for the amount of contrast and the fluoroscopy time utilized.   Electronically Signed   By: Geanie Cooley M.D.   On: 11/04/2012 14:50    PHYSICAL EXAM    Patient is oriented to person time and place. Affect is normal. There is no jugulovenous distention. Lungs are clear. Respiratory effort is nonlabored. Cardiac exam reveals S1 and S2. There is no significant peripheral edema. The abdomen is soft.  TELEMETRY:     I have reviewed telemetry today November 10, 2012. There is normal sinus rhythm.   ASSESSMENT AND PLAN:  The  patient is now stable and ready to go. We had considered nursing home placement but the patient refused. She will be ready to go today. Please list all of the problems below in the discharge summary.    Unstable angina     The patient was first admitted with chest discomfort. However ultimately was decided that her pain was related to her common bile duct stones and not her coronary status. No further cardiac workup at this time.    Allergy history, radiographic dye      The patient did not receive any died it would affect her allergy history.    Statin intolerance     There is statin intolerance. Statins cannot be used.    DM (diabetes mellitus), type 2 with neurological complications     In the hospital her diabetes was managed.    Hypothyroidism     Her thyroid function during the hospitalization was stable.    CKD (chronic kidney disease)       Kidney function remained stable during the hospitalization.    Carotid artery disease     The patient has severe carotid artery disease. I will continue to follow her very carefully. If she stabilizes completely, she does need an elective carotid endarterectomy.    Chronic systolic CHF (congestive heart failure)     Her volume status remains stable during the hospitalization.    Atrial fibrillation     The patient did not have any recurrent atrial fib in the hospital. She came in on amiodarone. I had held it for a few days because of her abnormal liver function studies. It was then restarted because her liver function abnormalities were related to her common bile duct stones.    NSVT (nonsustained ventricular tachycardia)    She did not have any ventricular tachycardia during the hospitalization.     Ischemic cardiomyopathy     She is on the appropriate medications that we can use for her cardiomyopathy    Elevated LFTs     Her liver functions improved after she had the procedure to remove her common bile duct stones.      Common bile  duct dilatation / obstruction      The patient had a procedure removing common bile duct stones. She is improved. We need to refer to the final procedure note to see the recommendations about followup of this.    UTI (urinary tract infection)     She is now on Cipro for this.         H/O amiodarone therapy     She is to go home on amiodarone at a lower dose than she was admitted with    Fatigue  She's had marked fatigue during the hospitalization. She has received physical therapy and outpatient therapy. Even though was felt she needs a nursing home she is getting stronger rapidly and will be going home.    Fever    She had fever during the hospitalization that is now resolved. The definite etiology is not clear. However all cultures have been treated appropriately. She is receiving medicines for her urinary tract infection    Diarrhea    She had brief diarrhea in the hospital and this resolved spontaneously.     Overflow incontinence   Urinary incontinence     The patient has had complete urologic evaluation during the hospitalization. Refer to the consult note. We will arrange for all of the specific followup to include her medications and outpatient followup with urology to occur soon.   [name] 11/10/2012 8:19 AM

## 2012-11-13 LAB — CULTURE, BLOOD (ROUTINE X 2): Culture: NO GROWTH

## 2012-11-15 ENCOUNTER — Ambulatory Visit (INDEPENDENT_AMBULATORY_CARE_PROVIDER_SITE_OTHER): Payer: Medicare Other | Admitting: Cardiology

## 2012-11-15 ENCOUNTER — Telehealth: Payer: Self-pay | Admitting: Family Medicine

## 2012-11-15 ENCOUNTER — Encounter: Payer: Self-pay | Admitting: Cardiology

## 2012-11-15 VITALS — BP 122/71 | HR 88 | Ht 65.0 in | Wt 196.0 lb

## 2012-11-15 DIAGNOSIS — Z888 Allergy status to other drugs, medicaments and biological substances status: Secondary | ICD-10-CM

## 2012-11-15 DIAGNOSIS — N189 Chronic kidney disease, unspecified: Secondary | ICD-10-CM

## 2012-11-15 DIAGNOSIS — I5022 Chronic systolic (congestive) heart failure: Secondary | ICD-10-CM

## 2012-11-15 DIAGNOSIS — Z789 Other specified health status: Secondary | ICD-10-CM

## 2012-11-15 DIAGNOSIS — K831 Obstruction of bile duct: Secondary | ICD-10-CM

## 2012-11-15 DIAGNOSIS — E039 Hypothyroidism, unspecified: Secondary | ICD-10-CM

## 2012-11-15 DIAGNOSIS — Z9229 Personal history of other drug therapy: Secondary | ICD-10-CM

## 2012-11-15 DIAGNOSIS — I4891 Unspecified atrial fibrillation: Secondary | ICD-10-CM

## 2012-11-15 DIAGNOSIS — R32 Unspecified urinary incontinence: Secondary | ICD-10-CM

## 2012-11-15 DIAGNOSIS — I509 Heart failure, unspecified: Secondary | ICD-10-CM

## 2012-11-15 DIAGNOSIS — I779 Disorder of arteries and arterioles, unspecified: Secondary | ICD-10-CM

## 2012-11-15 DIAGNOSIS — I2510 Atherosclerotic heart disease of native coronary artery without angina pectoris: Secondary | ICD-10-CM

## 2012-11-15 NOTE — Assessment & Plan Note (Signed)
Renal function remained stable while she was in the hospital.

## 2012-11-15 NOTE — Patient Instructions (Signed)
Your physician recommends that you schedule a follow-up appointment in: 8 weeks. Your physician recommends that you continue on your current medications as directed. Please refer to the Current Medication list given to you today. Please take your brilinta 90 mg twice daily.

## 2012-11-15 NOTE — Telephone Encounter (Signed)
Called and spoke with pt about needing to schedule a hospital follow up she stated that she will call back and make appt when she can get her daughter to bring her.

## 2012-11-15 NOTE — Progress Notes (Signed)
HPI  The patient is here post hospitalization. She had a complicated course which she's doing much better. While she was there she had abdominal pain. At first it was thought to possibly be cardiac. It then turned out that she had a common bile duct stone. This was removed and she was doing much better. She was assessed for urinary incontinence and recurrent urinary tract infections. She is to be seen by urology as an outpatient. Her volume status remained stable while she was in the hospital. Ultimately she was discharged home and she is now back for followup. Her amiodarone had been held for a period of time because of the abnormal liver function studies. When it became clear that this was related to her common bile duct stone, her amiodarone was restarted at a lower dose.   When she had her coronary stent placed earlier this year she was found to be hyporesponsive to Plavix. She was put on aspirin and Brilinta. Decision was made not to use Coumadin unless she had recurring episodes. She was kept on amiodarone because of the rapid atrial fib at that time. When she came in the hospital most recently she was not taking Brilinta because she could not afford it. Ultimately decision was made in the hospital to provide her with samples and that the company would help her going forward. She is here with the medication today. She is confused as to whether or not she should be taking it once or twice a day. I have informed her to take it twice a day and we're going over that carefully with her medication list.     Allergies  Allergen Reactions  . Atorvastatin Other (See Comments)    Unknown-patient admitted to hospital after taking  . Citrus Dermatitis    BREAK OUT ON BODY  . Contrast Media [Iodinated Diagnostic Agents] Other (See Comments)    Passed out  . Iohexol      Desc: ANAPHYLAXIS   . Plavix [Clopidogrel Bisulfate]     Not an allergy - but PRU was drawn 06/2012 indicating Plavix  hyporesponsiveness, thus she was changed to Brilinta.  . Zolpidem Tartrate     REACTION: hallucinations  . Milk-Related Compounds Rash    Current Outpatient Prescriptions  Medication Sig Dispense Refill  . acetaminophen (TYLENOL) 500 MG tablet Take 500 mg by mouth every 6 (six) hours as needed for pain.      [name] amiodarone (PACERONE) 200 MG tablet Take 1 tablet (200 mg total) by mouth daily.  30 tablet  3  . aspirin EC 81 MG tablet Take 81 mg by mouth every morning.      . cetirizine (ZYRTEC) 10 MG tablet Take 10 mg by mouth daily.      . ciprofloxacin (CIPRO) 500 MG tablet Take 1 tablet (500 mg total) by mouth 2 (two) times daily.  8 tablet  0  . fluticasone (FLONASE) 50 MCG/ACT nasal spray Place 1 spray into the nose daily as needed for allergies.      . folic acid (FOLVITE) 400 MCG tablet Take 400 mcg by mouth daily.      . furosemide (LASIX) 40 MG tablet Take 1 tablet (40 mg total) by mouth daily.  30 tablet  3  . insulin glargine (LANTUS) 100 UNIT/ML injection Inject 0.65 mLs (65 Units total) into the skin at bedtime.  10 mL  1  . insulin lispro (HUMALOG) 100 UNIT/ML injection Inject 15 Units into the skin 3 (three) times  daily before meals. Sliding scale as you were doing before admission to the hospital.  10 mL  1  . isosorbide mononitrate (IMDUR) 30 MG 24 hr tablet Take 1 tablet (30 mg total) by mouth every morning.  30 tablet  3  . levothyroxine (SYNTHROID, LEVOTHROID) 25 MCG tablet Take 25 mcg by mouth daily before breakfast.      . meclizine (ANTIVERT) 25 MG tablet Take 25 mg by mouth 2 (two) times daily.      . nitroGLYCERIN (NITROSTAT) 0.4 MG SL tablet Place 0.4 mg under the tongue every 5 (five) minutes as needed for chest pain. May repeat times three.      . polyethylene glycol (MIRALAX / GLYCOLAX) packet Take 17 g by mouth 2 (two) times daily. Hold for constipation  14 each  1  . promethazine (PHENERGAN) 25 MG tablet Take 1 tablet (25 mg total) by mouth every 6 (six) hours as  needed for nausea.  30 tablet  2  . ramipril (ALTACE) 2.5 MG capsule Take 2.5 mg by mouth daily.      . tamsulosin (FLOMAX) 0.4 MG CAPS capsule Take 1 capsule (0.4 mg total) by mouth daily.  30 capsule  1   No current facility-administered medications for this visit.    History   Social History  . Marital Status: Divorced    Spouse Name: N/A    Number of Children: N/A  . Years of Education: N/A   Occupational History  . Retired    Social History Main Topics  . Smoking status: Never Smoker   . Smokeless tobacco: Never Used  . Alcohol Use: 1.5 oz/week    3 drink(s) per week  . Drug Use: No  . Sexual Activity: Not Currently   Other Topics Concern  . Not on file   Social History Narrative  . No narrative on file    Family History  Problem Relation Age of Onset  . Heart disease Other   . Heart disease Mother   . Hypertension Mother   . Heart disease Father   . Hypertension Father     Past Medical History  Diagnosis Date  . Hypertension     Unspecified  . Hyperlipidemia     Mixed, statin intolerance.  [name] CAD (coronary artery disease) 08/21/09    a. CAD s/p CABG 2003. b. Cath 2006: occluded OM vein graft. c. Anterior MI 04/15/12 s/p PTCA only to ramus (stenting unsuccessful at that time) d. NSTEMI 06/2012: failed med rx, s/p DES to ramus c/b vessel perforation tx with balloon/stenting. Plavix hyporesponsive so changed to Brilinta.  . S/P CABG (coronary artery bypass graft) 2003     2003  LIMA, LAD, SVG to the OM, SVG right coronary artery  . Thoracic ascending aortic aneurysm 2003    Ascending thoracic aneurysm repair with a graft at time of CABG  . Diabetes mellitus     Insulin dependent  . Gastroparesis   . Depression   . Allergy history, radiographic dye     Contrast dye allergy  . Previous back surgery   . Syncope 2008    Question?  2008  . Chronic systolic heart failure   . Ischemic cardiomyopathy 06/2011    a. Echo 04/19/12: EF 25-30%, mod LVH, diffuse HK,  trivial AI  . Hypotension     October, 2013  . Hypothyroidism     Treated with low-dose Synthroid  . CKD (chronic kidney disease)   . Carotid artery disease  a. 80-99% prox RICA stenosis - with recent cardiac event 06/2012, favor waiting at least 1 month, optimally 3 months post PCI.  [name] Hypertriglyceridemia   . Statin intolerance   . Peripheral neuropathy   . Atrial fibrillation     a. Noted during 06/2012 hospitalization. Placed on amiodarone. Plan is for event monitor to assess for breakthrough, hold off anticoag unless additional AF seen.  [name] NSVT (nonsustained ventricular tachycardia)     a. Noted during 06/2012 hospitalization.    Past Surgical History  Procedure Laterality Date  . Coronary artery bypass graft  05/05/2001     LIMA, LAD, SVG to the OM, SVG right coronary artery  . Bladder tacking    . Salivary gland surgery    . Total abdominal hysterectomy    . Back surgery    . Breast surgery    . External ear surgery    . Cardiac catheterization  04/15/12, 07/05/12    60-70% proximal LAD, 95% mid AV groove circumflex s/p PTCA, proximal RCA occlusion, patent LIMA to LAD, patent SVG to distal RCA, chronically occluded SVG to circumflex; LVEF 25-30%  . Ercp N/A 11/04/2012    Procedure: ENDOSCOPIC RETROGRADE CHOLANGIOPANCREATOGRAPHY (ERCP);  Surgeon: Petra Kuba, MD;  Location: [facility] OR;  Service: Endoscopy;  Laterality: N/A;    Patient Active Problem List   Diagnosis Date Noted  . Urinary incontinence 11/10/2012  . Diarrhea 11/08/2012  . Overflow incontinence 11/08/2012  . Fever 11/07/2012  . Common bile duct (CBD) obstruction 11/06/2012  . Fatigue 11/06/2012  . Lower abdominal pain 11/06/2012  . Cholelithiasis 11/02/2012  . Common bile duct dilatation 11/02/2012  . UTI (urinary tract infection) 11/02/2012  . H/O amiodarone therapy 11/02/2012  . Elevated LFTs 11/01/2012  . Unstable angina 11/01/2012  . Atrial fibrillation   . NSVT (nonsustained ventricular tachycardia)    . Non-STEMI (non-ST elevated myocardial infarction) 06/28/2012  . Chronic systolic CHF (congestive heart failure) 05/23/2012  . CKD (chronic kidney disease)   . Carotid artery disease   . Right carotid bruit 04/28/2012  . Hypothyroidism   . Neck pain 10/24/2011  . 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 Back pain 08/21/2011  . Diabetic neuropathy 08/21/2011  . Ischemic cardiomyopathy 06/21/2011  . Shortness of breath   . Hypertension   . Hyperlipidemia   . S/P CABG (coronary artery bypass graft)   . Aneurysm   . Depression   . Allergy history, radiographic dye   . Syncope   . Statin intolerance   . CAD (coronary artery disease) 08/21/2009    ROS   Patient denies fever, chills, headache, sweats, rash, change in vision, change in hearing, chest pain, cough, nausea vomiting, urinary symptoms. All other systems were reviewed and were negative.  PHYSICAL EXAM  She is stable today. She is oriented to person time and place. Affect is normal. There is no jugulovenous distention. Lungs are clear. Respiratory effort is nonlabored. Cardiac exam reveals S1 and S2. There no clicks or significant murmurs. The abdomen is soft. There is no peripheral edema. There are no musculoskeletal deformities. There are no skin rashes.  Filed Vitals:   11/15/12 1332  BP: 122/71  Pulse: 88  Height: 5\' 5"  (1.651 m)  Weight: 196 lb (88.905 kg)  SpO2: 98%     ASSESSMENT & PLAN

## 2012-11-15 NOTE — Assessment & Plan Note (Signed)
She has statin intolerance.

## 2012-11-15 NOTE — Assessment & Plan Note (Signed)
Coronary disease is stable. We are now working on obtaining Brilinta for her. She has samples. I was told that all of this had been arranged while she was in the hospital. We are trying to finalize this.

## 2012-11-15 NOTE — Telephone Encounter (Signed)
m °

## 2012-11-15 NOTE — Assessment & Plan Note (Signed)
She is receiving Cipro for her most recent urinary tract infection.

## 2012-11-15 NOTE — Assessment & Plan Note (Signed)
She was assessed fully in the hospital by urology in October, 2014. She has urinary incontinence, urinary retention, and recurrent UTIs which are all related. This is from hypomobility of her bladder related to diabetes. Plans are for her to be seen by urology as an outpatient.

## 2012-11-15 NOTE — Assessment & Plan Note (Signed)
This was treated by Dr. Ewing Schlein while she was in the hospital October, 2014. His notes will have to be reviewed concerning the followup of this problem.

## 2012-11-15 NOTE — Assessment & Plan Note (Signed)
Patient is now on 200 mg of amiodarone daily. This will be continued for now. She held sinus rhythm throughout her hospitalization.

## 2012-11-15 NOTE — Assessment & Plan Note (Signed)
Thyroid studies were checked recently in the hospital. TSH was in the normal range.

## 2012-11-15 NOTE — Assessment & Plan Note (Signed)
She has severe carotid disease. When she is completely stable she needs elective endarterectomy for severe disease.

## 2012-11-15 NOTE — Assessment & Plan Note (Signed)
Her volume status remains stable.

## 2012-11-15 NOTE — Assessment & Plan Note (Addendum)
She is on amiodarone. She is not being anticoagulated because she needs dual antiplatelet therapy for her coronaries and because she had single episode rapid atrial fibrillation while hospitalized with her non-STEMI. Decision will be made over time about future treatment.  As part of today's evaluation I have spent greater than 25 minutes for her complete evaluation. More than half of this time was spent with direct contact with the patient and her son carefully reviewing all of her problems in her medications. I've actually spent in the range of 40 minutes on her care today.

## 2012-11-17 ENCOUNTER — Telehealth: Payer: Self-pay | Admitting: Family Medicine

## 2012-11-17 MED ORDER — MECLIZINE HCL 25 MG PO TABS: 25.0000 mg | ORAL_TABLET | Freq: Two times a day (BID) | ORAL | Status: DC

## 2012-11-17 NOTE — Telephone Encounter (Signed)
Last office visit with you was 06/25/2012, nothing on med list as to when she had a refill, ? Ok to refill

## 2012-11-17 NOTE — Telephone Encounter (Signed)
Medication refilled

## 2012-11-24 ENCOUNTER — Telehealth: Payer: Self-pay | Admitting: Internal Medicine

## 2012-11-24 NOTE — Telephone Encounter (Signed)
Verbal okay given for PT on this patient per requested.

## 2012-11-24 NOTE — Telephone Encounter (Signed)
New problem    They have performed a PT eval and recommend pt to be seen 2x a wk for 2wks. Please advise

## 2012-11-25 ENCOUNTER — Telehealth: Payer: Self-pay | Admitting: *Deleted

## 2012-11-25 NOTE — Telephone Encounter (Signed)
Cathy [name] with [facility] called - patient started taking Brilanta twice a day on 11/15/2012.  Complain of nausea, weakness, headaches, generally not feeling good.  Stopped x couple days & started to feel a little better so she restarted, started feeling the same again.  Was initially taking daily.  Stated she could not tell if she felt any better on the daily dosing since she had just gotten out of hospital and felt weak anyway.  Informed Cathy [name] that message will be sent to Dr. Myrtis Ser for advice.  She verbalized understanding.

## 2012-11-25 NOTE — Telephone Encounter (Signed)
Patient is hyporesponsive to Plavix by history. However I believe she tolerates this. She will be better on this and nothing at all. Plan to hold the Brilinta. Start Plavix 75 mg daily.

## 2012-11-26 ENCOUNTER — Inpatient Hospital Stay: Payer: Medicare Other | Admitting: Family Medicine

## 2012-11-26 NOTE — Telephone Encounter (Signed)
Left message to return call 

## 2012-11-26 NOTE — Telephone Encounter (Signed)
Cathy [name] with [facility] notified.    Patient notified also.  States that she does not want to take either.  They both make her sick.  She is currently on Aspirin 81mg  daily.

## 2012-12-21 ENCOUNTER — Other Ambulatory Visit: Payer: Self-pay | Admitting: *Deleted

## 2012-12-21 MED ORDER — MECLIZINE HCL 25 MG PO TABS: 25.0000 mg | ORAL_TABLET | Freq: Two times a day (BID) | ORAL | Status: DC

## 2012-12-21 NOTE — Telephone Encounter (Signed)
Med refilled.

## 2012-12-28 ENCOUNTER — Other Ambulatory Visit: Payer: Self-pay | Admitting: Family Medicine

## 2012-12-28 NOTE — Telephone Encounter (Signed)
Medication refilled per protocol. 

## 2012-12-29 ENCOUNTER — Ambulatory Visit (INDEPENDENT_AMBULATORY_CARE_PROVIDER_SITE_OTHER): Payer: Medicare Other | Admitting: Family Medicine

## 2012-12-29 VITALS — BP 120/70 | HR 78 | Temp 97.6°F | Resp 20 | Ht 65.0 in | Wt 198.0 lb

## 2012-12-29 DIAGNOSIS — I4891 Unspecified atrial fibrillation: Secondary | ICD-10-CM

## 2012-12-29 DIAGNOSIS — Z23 Encounter for immunization: Secondary | ICD-10-CM

## 2012-12-29 DIAGNOSIS — E1149 Type 2 diabetes mellitus with other diabetic neurological complication: Secondary | ICD-10-CM

## 2012-12-29 DIAGNOSIS — N390 Urinary tract infection, site not specified: Secondary | ICD-10-CM

## 2012-12-29 DIAGNOSIS — E1142 Type 2 diabetes mellitus with diabetic polyneuropathy: Secondary | ICD-10-CM

## 2012-12-29 DIAGNOSIS — I10 Essential (primary) hypertension: Secondary | ICD-10-CM

## 2012-12-29 DIAGNOSIS — N189 Chronic kidney disease, unspecified: Secondary | ICD-10-CM

## 2012-12-29 LAB — COMPLETE METABOLIC PANEL WITH GFR
ALT: 9 U/L (ref 0–35)
Albumin: 3.4 g/dL — ABNORMAL LOW (ref 3.5–5.2)
CO2: 26 mEq/L (ref 19–32)
Chloride: 105 mEq/L (ref 96–112)
GFR, Est African American: 66 mL/min
Glucose, Bld: 171 mg/dL — ABNORMAL HIGH (ref 70–99)
Potassium: 4.6 mEq/L (ref 3.5–5.3)
Sodium: 139 mEq/L (ref 135–145)
Total Bilirubin: 0.5 mg/dL (ref 0.3–1.2)
Total Protein: 6.4 g/dL (ref 6.0–8.3)

## 2012-12-29 LAB — CBC WITH DIFFERENTIAL/PLATELET
HCT: 41.1 % (ref 36.0–46.0)
Hemoglobin: 13.8 g/dL (ref 12.0–15.0)
Lymphocytes Relative: 44 % (ref 12–46)
Lymphs Abs: 4 10*3/uL (ref 0.7–4.0)
MCHC: 33.6 g/dL (ref 30.0–36.0)
Monocytes Absolute: 0.7 10*3/uL (ref 0.1–1.0)
Monocytes Relative: 8 % (ref 3–12)
Neutro Abs: 4.2 10*3/uL (ref 1.7–7.7)
Neutrophils Relative %: 47 % (ref 43–77)
RBC: 4.72 MIL/uL (ref 3.87–5.11)

## 2012-12-29 MED ORDER — CIPROFLOXACIN HCL 500 MG PO TABS: 500.0000 mg | ORAL_TABLET | Freq: Two times a day (BID) | ORAL | Status: DC

## 2012-12-29 NOTE — Patient Instructions (Signed)
We will call with lab results  Please get last note from Dr. Fransico Him Start antibiotics for the urine infection Prevnar 13  F/U 4 months

## 2012-12-30 ENCOUNTER — Encounter: Payer: Self-pay | Admitting: *Deleted

## 2012-12-30 ENCOUNTER — Encounter: Payer: Self-pay | Admitting: Family Medicine

## 2012-12-30 LAB — URINALYSIS, ROUTINE W REFLEX MICROSCOPIC
Glucose, UA: NEGATIVE mg/dL
Nitrite: POSITIVE — AB
Protein, ur: NEGATIVE mg/dL
Specific Gravity, Urine: 1.027 (ref 1.005–1.030)
pH: 5.5 (ref 5.0–8.0)

## 2012-12-30 LAB — URINALYSIS, MICROSCOPIC ONLY: Casts: NONE SEEN

## 2012-12-30 NOTE — Assessment & Plan Note (Signed)
She did not tolerateBrillinta however she is on amiodarone. She's taking aspirin daily

## 2012-12-30 NOTE — Assessment & Plan Note (Signed)
I will recheck her renal function today. °

## 2012-12-30 NOTE — Assessment & Plan Note (Signed)
Blood pressure is well-controlled. She would benefit from being in a skilled nursing facility but continues to decline this.

## 2012-12-30 NOTE — Assessment & Plan Note (Signed)
Recurrent UTI. She does not follow with urologist, we'll go ahead and sick her on Cipro 500 mg twice a day

## 2012-12-30 NOTE — Progress Notes (Signed)
   Subjective:    Patient ID: Vella Kohler, female    DOB: 29-Apr-1930, 79 y.o.   MRN: [id number]  HPI  Patient here for hospital followup. She was last seen by me in June of 2014. She's had a few admissions since then. She was last admitted in October for chest pain but was found to have gallstones and common bile duct obstruction. Since then she has been at home and she refused skilled nursing facility. She has been following up with her cardiologist as well as her endocrinologist. He's not had any chest pain or abdominal pain recently. He does complain of UTI symptoms for the past week. She was last treated with Cipro at her last admission for Escherichia coli. She has not followed up with her urologist Dr. Curt Bears and does not want to do so until she has her cataracts evaluated by her eye doctor.  Diabetes mellitus she states that her recent A1c was 8% she is being followed by Dr. Fransico Him she is on Lantus 60 units and Humalog sliding scale.   Review of Systems  GEN- denies fatigue, fever, weight loss,weakness, recent illness HEENT- denies eye drainage, change in vision, nasal discharge, CVS- denies chest pain, palpitations RESP- denies SOB, cough, wheeze ABD- denies N/V, change in stools, abd pain GU- + dysuria, hematuria, dribbling, incontinence MSK- denies joint pain, muscle aches, injury Neuro- denies headache, dizziness, syncope, seizure activity       Objective:   Physical Exam  GEN- NAD, alert and oriented x3, sitting in wheelchair, HEENT- PERRL, EOMI, non injected sclera, pink conjunctiva, oropharynx clear, TM clear bilat no effusion Neck- Supple, no LAD CVS- RRR, no murmur RESP-CTAB decreased bases ABD-NABS,soft,NT,ND, no suprapubic tenderness EXT- No edema, venous stasis changes Pulses- Radial 2+ Neuro- CNII-XII in tact,         Assessment & Plan:

## 2012-12-30 NOTE — Assessment & Plan Note (Signed)
I will obtain the last note from endocrinology as well as her labs. She is on ACE inhibitor. She does not tolerate statin

## 2013-01-04 ENCOUNTER — Ambulatory Visit: Payer: Medicare Other | Admitting: Cardiology

## 2013-01-06 ENCOUNTER — Other Ambulatory Visit: Payer: Self-pay | Admitting: Cardiology

## 2013-01-06 MED ORDER — CARVEDILOL 25 MG PO TABS: 25.0000 mg | ORAL_TABLET | Freq: Two times a day (BID) | ORAL | Status: DC

## 2013-01-06 NOTE — Telephone Encounter (Signed)
Called pt and verified that pt was taking medication Carvedilol 25 MG twice daily.

## 2013-01-21 ENCOUNTER — Telehealth: Payer: Self-pay | Admitting: Family Medicine

## 2013-01-21 MED ORDER — MECLIZINE HCL 25 MG PO TABS: 25.0000 mg | ORAL_TABLET | Freq: Two times a day (BID) | ORAL | Status: DC

## 2013-01-21 NOTE — Telephone Encounter (Signed)
Medication refilled per protocol. 

## 2013-01-24 ENCOUNTER — Other Ambulatory Visit: Payer: Self-pay

## 2013-01-24 ENCOUNTER — Telehealth: Payer: Self-pay | Admitting: *Deleted

## 2013-01-24 MED ORDER — ISOSORBIDE MONONITRATE ER 30 MG PO TB24: 30.0000 mg | ORAL_TABLET | Freq: Every morning | ORAL | Status: DC

## 2013-01-24 NOTE — Telephone Encounter (Signed)
Patient said [facility] that her ramipril 2.5 mg was delivered to her on 12/28/12 but patient say's she doesn't have it and has been out of this medication for over a month. Nurse advised patient that she needed to get back on it asap. Nurse called pharmacy for patient, spoke with Nate and he informed nurse that he would have ramipiril 2.5 mg delivered to patient's home today. Patient was confused that she was to hold this medication. Nurse informed patient that her last ov [facility] her taking ramipril. Patient states she hasn't been to ED or in hospital anywhere since her last visits. Patient verbalized understanding of plan.

## 2013-01-25 ENCOUNTER — Telehealth: Payer: Self-pay | Admitting: *Deleted

## 2013-01-25 MED ORDER — PROMETHAZINE HCL 25 MG PO TABS: 25.0000 mg | ORAL_TABLET | Freq: Four times a day (QID) | ORAL | Status: DC | PRN

## 2013-01-25 NOTE — Telephone Encounter (Signed)
Meds refilled.

## 2013-01-27 ENCOUNTER — Telehealth: Payer: Self-pay | Admitting: Family Medicine

## 2013-01-27 NOTE — Telephone Encounter (Signed)
Pt is wanting to know who Dr Raford PitcherBarrett is? She believes it is the dr who saw her in the hospital But she is needing  tamsulosin 0.4 mg (flomax) refilled and that is who wrote that prescription  Call back number is [phone] [facility]

## 2013-01-28 MED ORDER — TAMSULOSIN HCL 0.4 MG PO CAPS: 0.4000 mg | ORAL_CAPSULE | Freq: Every day | ORAL | Status: DC

## 2013-01-28 NOTE — Telephone Encounter (Signed)
Meds refilled.

## 2013-01-28 NOTE — Telephone Encounter (Signed)
Is is ok to refill flomax for pt?

## 2013-01-28 NOTE — Telephone Encounter (Signed)
Okay to give her 30 day supply - she needs to see urology before any further refills on that medication

## 2013-02-02 ENCOUNTER — Other Ambulatory Visit: Payer: Self-pay | Admitting: Family Medicine

## 2013-02-02 ENCOUNTER — Ambulatory Visit: Payer: Medicare Other | Admitting: Cardiology

## 2013-02-03 NOTE — Telephone Encounter (Signed)
Just Rf 01/21/13  Denied too soon

## 2013-02-04 ENCOUNTER — Telehealth: Payer: Self-pay | Admitting: Family Medicine

## 2013-02-04 MED ORDER — LEVOTHYROXINE SODIUM 25 MCG PO TABS: ORAL_TABLET | ORAL | Status: DC

## 2013-02-04 MED ORDER — MECLIZINE HCL 25 MG PO TABS: 25.0000 mg | ORAL_TABLET | Freq: Two times a day (BID) | ORAL | Status: DC

## 2013-02-04 NOTE — Telephone Encounter (Signed)
Meds refilled.

## 2013-02-04 NOTE — Telephone Encounter (Signed)
Pt is needing a refill on her Levothyroxine and her Meclizine  Call back number is [phone] Pharmacy [facility]

## 2013-02-25 ENCOUNTER — Telehealth: Payer: Self-pay | Admitting: Family Medicine

## 2013-02-25 MED ORDER — MECLIZINE HCL 25 MG PO TABS: 25.0000 mg | ORAL_TABLET | Freq: Two times a day (BID) | ORAL | Status: DC

## 2013-02-25 NOTE — Telephone Encounter (Signed)
Meds refilled.

## 2013-02-25 NOTE — Telephone Encounter (Signed)
Call back number is [phone] Pt is needing a refill on her  meclizine (ANTIVERT) 25 MG tablet Pharmacy is [facility]

## 2013-02-28 ENCOUNTER — Other Ambulatory Visit: Payer: Self-pay

## 2013-02-28 MED ORDER — RAMIPRIL 2.5 MG PO CAPS: 2.5000 mg | ORAL_CAPSULE | Freq: Every day | ORAL | Status: DC

## 2013-03-04 ENCOUNTER — Ambulatory Visit (INDEPENDENT_AMBULATORY_CARE_PROVIDER_SITE_OTHER): Payer: Commercial Managed Care - HMO | Admitting: Cardiology

## 2013-03-04 ENCOUNTER — Encounter: Payer: Self-pay | Admitting: Cardiology

## 2013-03-04 VITALS — BP 107/63 | HR 63 | Ht 65.0 in | Wt 197.2 lb

## 2013-03-04 DIAGNOSIS — I10 Essential (primary) hypertension: Secondary | ICD-10-CM

## 2013-03-04 DIAGNOSIS — I255 Ischemic cardiomyopathy: Secondary | ICD-10-CM

## 2013-03-04 DIAGNOSIS — I509 Heart failure, unspecified: Secondary | ICD-10-CM

## 2013-03-04 DIAGNOSIS — Z01810 Encounter for preprocedural cardiovascular examination: Secondary | ICD-10-CM | POA: Insufficient documentation

## 2013-03-04 DIAGNOSIS — I739 Peripheral vascular disease, unspecified: Secondary | ICD-10-CM

## 2013-03-04 DIAGNOSIS — I5022 Chronic systolic (congestive) heart failure: Secondary | ICD-10-CM

## 2013-03-04 DIAGNOSIS — I2589 Other forms of chronic ischemic heart disease: Secondary | ICD-10-CM

## 2013-03-04 DIAGNOSIS — E039 Hypothyroidism, unspecified: Secondary | ICD-10-CM

## 2013-03-04 DIAGNOSIS — I4891 Unspecified atrial fibrillation: Secondary | ICD-10-CM

## 2013-03-04 DIAGNOSIS — I779 Disorder of arteries and arterioles, unspecified: Secondary | ICD-10-CM

## 2013-03-04 DIAGNOSIS — Z9229 Personal history of other drug therapy: Secondary | ICD-10-CM

## 2013-03-04 DIAGNOSIS — I2510 Atherosclerotic heart disease of native coronary artery without angina pectoris: Secondary | ICD-10-CM

## 2013-03-04 DIAGNOSIS — E785 Hyperlipidemia, unspecified: Secondary | ICD-10-CM

## 2013-03-04 DIAGNOSIS — K831 Obstruction of bile duct: Secondary | ICD-10-CM

## 2013-03-04 NOTE — Assessment & Plan Note (Signed)
Her most recent EF was 41%. She is on appropriate medications. No change in therapy.

## 2013-03-04 NOTE — Assessment & Plan Note (Signed)
Her volume status is stable. No change in therapy. 

## 2013-03-04 NOTE — Assessment & Plan Note (Signed)
Her thyroid is treated with low-dose Synthroid. She is on amiodarone. TSH needs to be rechecked.

## 2013-03-04 NOTE — Assessment & Plan Note (Signed)
Blood pressure is stable. No change in therapy. 

## 2013-03-04 NOTE — Assessment & Plan Note (Signed)
She has severe carotid disease. If she remains stable over the next 6 months, I will consider referring her again for reconsideration of surgery.

## 2013-03-04 NOTE — Assessment & Plan Note (Signed)
The patient has statin intolerance. 

## 2013-03-04 NOTE — Assessment & Plan Note (Signed)
Fortunately she has not had any return of the common bile duct stones. No further evaluation.  As part of today's evaluation I spent greater than 25 minutes with her total care. More than half of this time is been spent with direct contact with the patient. Her overall medical status is quite complex.

## 2013-03-04 NOTE — Progress Notes (Signed)
HPI  Patient is seen today to followup coronary disease and atrial fibrillation. I saw her last October, 2014. At that time she was posthospitalization. She had a common bile duct stone. After that time she was seen by urology as an outpatient. Because she was on dual antiplatelet therapy for her coronary disease, decision was made to not use Coumadin for the single episode of atrial fib that she had. She was continued on amiodarone to optimize keeping her in sinus rhythm. Over the past few months she has not had any chest pain or shortness of breath. She needs cataract surgery.  Allergies  Allergen Reactions  . Atorvastatin Other (See Comments)    Unknown-patient admitted to hospital after taking  . Citrus Dermatitis    BREAK OUT ON BODY  . Contrast Media [Iodinated Diagnostic Agents] Other (See Comments)    Passed out  . Iohexol      Desc: ANAPHYLAXIS   . Plavix [Clopidogrel Bisulfate]     Not an allergy - but PRU was drawn 06/2012 indicating Plavix hyporesponsiveness, thus she was changed to Brilinta.  . Zolpidem Tartrate     REACTION: hallucinations  . Milk-Related Compounds Rash    Current Outpatient Prescriptions  Medication Sig Dispense Refill  . acetaminophen (TYLENOL) 500 MG tablet Take 500 mg by mouth every 6 (six) hours as needed for pain.      [name] amiodarone (PACERONE) 200 MG tablet Take 1 tablet (200 mg total) by mouth daily.  30 tablet  3  . aspirin EC 81 MG tablet Take 81 mg by mouth every morning.      . cetirizine (ZYRTEC) 10 MG tablet Take 10 mg by mouth daily.      . fluticasone (FLONASE) 50 MCG/ACT nasal spray Place 1 spray into the nose daily as needed for allergies.      . folic acid (FOLVITE) 400 MCG tablet Take 400 mcg by mouth daily.      . furosemide (LASIX) 40 MG tablet Take 1 tablet (40 mg total) by mouth daily.  30 tablet  3  . insulin glargine (LANTUS) 100 UNIT/ML injection Inject 0.65 mLs (65 Units total) into the skin at bedtime.  10 mL  1  . insulin  lispro (HUMALOG) 100 UNIT/ML injection Inject 15 Units into the skin 3 (three) times daily before meals. Sliding scale as you were doing before admission to the hospital.  10 mL  1  . isosorbide mononitrate (IMDUR) 30 MG 24 hr tablet Take 1 tablet (30 mg total) by mouth every morning.  30 tablet  3  . levothyroxine (SYNTHROID, LEVOTHROID) 25 MCG tablet TAKE 1 TABLET BY MOUTH ONCE DAILY.  30 tablet  3  . meclizine (ANTIVERT) 25 MG tablet Take 1 tablet (25 mg total) by mouth 2 (two) times daily.  30 tablet  1  . nitroGLYCERIN (NITROSTAT) 0.4 MG SL tablet Place 0.4 mg under the tongue every 5 (five) minutes as needed for chest pain. May repeat times three.      . polyethylene glycol (MIRALAX / GLYCOLAX) packet Take 17 g by mouth 2 (two) times daily. Hold for constipation  14 each  1  . promethazine (PHENERGAN) 25 MG tablet Take 1 tablet (25 mg total) by mouth every 6 (six) hours as needed for nausea.  30 tablet  2  . ramipril (ALTACE) 2.5 MG capsule Take 1 capsule (2.5 mg total) by mouth daily.  30 capsule  6  . tamsulosin (FLOMAX) 0.4 MG CAPS capsule  Take 1 capsule (0.4 mg total) by mouth daily.  30 capsule  1  . carvedilol (COREG) 25 MG tablet Take 1 tablet (25 mg total) by mouth 2 (two) times daily.  60 tablet  6   No current facility-administered medications for this visit.    History   Social History  . Marital Status: Divorced    Spouse Name: N/A    Number of Children: N/A  . Years of Education: N/A   Occupational History  . Retired    Social History Main Topics  . Smoking status: Never Smoker   . Smokeless tobacco: Never Used  . Alcohol Use: 1.5 oz/week    3 drink(s) per week  . Drug Use: No  . Sexual Activity: Not Currently   Other Topics Concern  . Not on file   Social History Narrative  . No narrative on file    Family History  Problem Relation Age of Onset  . Heart disease Other   . Heart disease Mother   . Hypertension Mother   . Heart disease Father   .  Hypertension Father     Past Medical History  Diagnosis Date  . Hypertension     Unspecified  . Hyperlipidemia     Mixed, statin intolerance.  [name]. CAD (coronary artery disease) 08/21/09    a. CAD s/p CABG 2003. b. Cath 2006: occluded OM vein graft. c. Anterior MI 04/15/12 s/p PTCA only to ramus (stenting unsuccessful at that time) d. NSTEMI 06/2012: failed med rx, s/p DES to ramus c/b vessel perforation tx with balloon/stenting. Plavix hyporesponsive so changed to Brilinta.  . S/P CABG (coronary artery bypass graft) 2003     2003  LIMA, LAD, SVG to the OM, SVG right coronary artery  . Thoracic ascending aortic aneurysm 2003    Ascending thoracic aneurysm repair with a graft at time of CABG  . Diabetes mellitus     Insulin dependent  . Gastroparesis   . Depression   . Allergy history, radiographic dye     Contrast dye allergy  . Previous back surgery   . Syncope 2008    Question?  2008  . Chronic systolic heart failure   . Ischemic cardiomyopathy 06/2011    a. Echo 04/19/12: EF 25-30%, mod LVH, diffuse HK, trivial AI  . Hypotension     October, 2013  . Hypothyroidism     Treated with low-dose Synthroid  . CKD (chronic kidney disease)   . Carotid artery disease     a. 80-99% prox RICA stenosis - with recent cardiac event 06/2012, favor waiting at least 1 month, optimally 3 months post PCI.  [name]. Hypertriglyceridemia   . Statin intolerance   . Peripheral neuropathy   . Atrial fibrillation     a. Noted during 06/2012 hospitalization. Placed on amiodarone. Plan is for event monitor to assess for breakthrough, hold off anticoag unless additional AF seen.  [name]. NSVT (nonsustained ventricular tachycardia)     a. Noted during 06/2012 hospitalization.    Past Surgical History  Procedure Laterality Date  . Coronary artery bypass graft  05/05/2001     LIMA, LAD, SVG to the OM, SVG right coronary artery  . Bladder tacking    . Salivary gland surgery    . Total abdominal hysterectomy    . Back  surgery    . Breast surgery    . External ear surgery    . Cardiac catheterization  04/15/12, 07/05/12    60-70% proximal LAD, 95%  mid AV groove circumflex s/p PTCA, proximal RCA occlusion, patent LIMA to LAD, patent SVG to distal RCA, chronically occluded SVG to circumflex; LVEF 25-30%  . Ercp N/A 11/04/2012    Procedure: ENDOSCOPIC RETROGRADE CHOLANGIOPANCREATOGRAPHY (ERCP);  Surgeon: Petra Kuba, MD;  Location: [facility] OR;  Service: Endoscopy;  Laterality: N/A;    Patient Active Problem List   Diagnosis Date Noted  . Urinary incontinence 11/10/2012  . Common bile duct (CBD) obstruction 11/06/2012  . Fatigue 11/06/2012  . Cholelithiasis 11/02/2012  . UTI (urinary tract infection) 11/02/2012  . H/O amiodarone therapy 11/02/2012  . Atrial fibrillation   . NSVT (nonsustained ventricular tachycardia)   . Chronic systolic CHF (congestive heart failure) 05/23/2012  . CKD (chronic kidney disease)   . Carotid artery disease   . Right carotid bruit 04/28/2012  . Hypothyroidism   . Neck pain 10/24/2011  . 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 Back pain 08/21/2011  . Diabetic neuropathy 08/21/2011  . Ischemic cardiomyopathy 06/21/2011  . Shortness of breath   . Hypertension   . Hyperlipidemia   . S/P CABG (coronary artery bypass graft)   . Aneurysm   . Depression   . Allergy history, radiographic dye   . Syncope   . Statin intolerance   . CAD (coronary artery disease) 08/21/2009    ROS   Patient denies fever, chills, headache, sweats, rash, change in vision, change in hearing, chest pain, cough, nausea vomiting, urinary symptoms. All other systems are reviewed and are negative. This  PHYSICAL EXAM  Patient is in a wheelchair. She is stable. She is oriented to person time and place. Affect is normal. There is no jugulovenous distention. Lungs reveal scattered rhonchi. Cardiac exam reveals S1 and S2. There are no clicks or significant murmurs. The  abdomen is soft. There is no significant peripheral edema.  Filed Vitals:   03/04/13 1439  BP: 107/63  Pulse: 63  Height: 5\' 5"  (1.651 m)  Weight: 197 lb 4 oz (89.472 kg)     ASSESSMENT & PLAN

## 2013-03-04 NOTE — Patient Instructions (Signed)
Your physician recommends that you schedule a follow-up appointment in: 6 months. You will receive a reminder letter in the mail in about 4 months reminding you to call and schedule your appointment. If you don't receive this letter, please contact our office. Your physician recommends that you continue on your current medications as directed. Please refer to the Current Medication list given to you today. Your physician recommends that you have lab work to check your TSH. You are cleared to have your cataract surgery. We will contact your eye surgeon.

## 2013-03-04 NOTE — Assessment & Plan Note (Signed)
Amiodarone will be kept at 200 mg daily for her ventricular tachycardia and atrial fibrillation. Her other amiodarone labs have recently been checked but there is no TSH. This will be obtained.

## 2013-03-04 NOTE — Assessment & Plan Note (Signed)
The patient is stable to have cataract surgery. After today's evaluation she is cleared for this.

## 2013-03-04 NOTE — Assessment & Plan Note (Signed)
Coronary disease is stable. No change in therapy. 

## 2013-03-04 NOTE — Assessment & Plan Note (Addendum)
She is holding sinus rhythm. She is not anticoagulated because we saw only one episode. Amiodarone will be continued. She wore an event recorder that showed that she did not have any breakthrough atrial fibrillation. She had nonsustained ventricular tachycardia while she was in the hospital. Therefore the dose will be continued at 200 mg daily.

## 2013-03-07 ENCOUNTER — Other Ambulatory Visit: Payer: Self-pay

## 2013-03-07 MED ORDER — AMIODARONE HCL 200 MG PO TABS: 200.0000 mg | ORAL_TABLET | Freq: Every day | ORAL | Status: DC

## 2013-03-12 ENCOUNTER — Encounter (HOSPITAL_COMMUNITY): Payer: Self-pay | Admitting: Emergency Medicine

## 2013-03-12 ENCOUNTER — Emergency Department (HOSPITAL_COMMUNITY): Payer: Medicare HMO

## 2013-03-12 ENCOUNTER — Inpatient Hospital Stay (HOSPITAL_COMMUNITY)
Admission: EM | Admit: 2013-03-12 | Discharge: 2013-03-15 | DRG: 286 | Disposition: A | Discharge status: Home / Self Care | Payer: Medicare HMO | Attending: Cardiology | Admitting: Cardiology

## 2013-03-12 ENCOUNTER — Telehealth: Payer: Self-pay | Admitting: Internal Medicine

## 2013-03-12 DIAGNOSIS — I200 Unstable angina: Secondary | ICD-10-CM

## 2013-03-12 DIAGNOSIS — N189 Chronic kidney disease, unspecified: Secondary | ICD-10-CM | POA: Diagnosis present

## 2013-03-12 DIAGNOSIS — I2510 Atherosclerotic heart disease of native coronary artery without angina pectoris: Secondary | ICD-10-CM | POA: Diagnosis present

## 2013-03-12 DIAGNOSIS — E119 Type 2 diabetes mellitus without complications: Secondary | ICD-10-CM | POA: Diagnosis present

## 2013-03-12 DIAGNOSIS — K3184 Gastroparesis: Secondary | ICD-10-CM | POA: Diagnosis present

## 2013-03-12 DIAGNOSIS — I2589 Other forms of chronic ischemic heart disease: Secondary | ICD-10-CM | POA: Diagnosis present

## 2013-03-12 DIAGNOSIS — E039 Hypothyroidism, unspecified: Secondary | ICD-10-CM | POA: Diagnosis present

## 2013-03-12 DIAGNOSIS — F3289 Other specified depressive episodes: Secondary | ICD-10-CM | POA: Diagnosis present

## 2013-03-12 DIAGNOSIS — I779 Disorder of arteries and arterioles, unspecified: Secondary | ICD-10-CM | POA: Diagnosis present

## 2013-03-12 DIAGNOSIS — I712 Thoracic aortic aneurysm, without rupture, unspecified: Secondary | ICD-10-CM | POA: Diagnosis present

## 2013-03-12 DIAGNOSIS — Z91041 Radiographic dye allergy status: Secondary | ICD-10-CM

## 2013-03-12 DIAGNOSIS — R0789 Other chest pain: Secondary | ICD-10-CM

## 2013-03-12 DIAGNOSIS — Z794 Long term (current) use of insulin: Secondary | ICD-10-CM

## 2013-03-12 DIAGNOSIS — R072 Precordial pain: Principal | ICD-10-CM | POA: Diagnosis present

## 2013-03-12 DIAGNOSIS — F32A Depression, unspecified: Secondary | ICD-10-CM | POA: Diagnosis present

## 2013-03-12 DIAGNOSIS — A498 Other bacterial infections of unspecified site: Secondary | ICD-10-CM | POA: Diagnosis present

## 2013-03-12 DIAGNOSIS — Z951 Presence of aortocoronary bypass graft: Secondary | ICD-10-CM

## 2013-03-12 DIAGNOSIS — E785 Hyperlipidemia, unspecified: Secondary | ICD-10-CM | POA: Diagnosis present

## 2013-03-12 DIAGNOSIS — I700 Atherosclerosis of aorta: Secondary | ICD-10-CM | POA: Diagnosis present

## 2013-03-12 DIAGNOSIS — R0601 Orthopnea: Secondary | ICD-10-CM | POA: Diagnosis present

## 2013-03-12 DIAGNOSIS — I129 Hypertensive chronic kidney disease with stage 1 through stage 4 chronic kidney disease, or unspecified chronic kidney disease: Secondary | ICD-10-CM | POA: Diagnosis present

## 2013-03-12 DIAGNOSIS — I4891 Unspecified atrial fibrillation: Secondary | ICD-10-CM | POA: Diagnosis present

## 2013-03-12 DIAGNOSIS — I5022 Chronic systolic (congestive) heart failure: Secondary | ICD-10-CM | POA: Diagnosis present

## 2013-03-12 DIAGNOSIS — I5023 Acute on chronic systolic (congestive) heart failure: Secondary | ICD-10-CM | POA: Diagnosis present

## 2013-03-12 DIAGNOSIS — Z8249 Family history of ischemic heart disease and other diseases of the circulatory system: Secondary | ICD-10-CM

## 2013-03-12 DIAGNOSIS — E781 Pure hyperglyceridemia: Secondary | ICD-10-CM | POA: Diagnosis present

## 2013-03-12 DIAGNOSIS — F329 Major depressive disorder, single episode, unspecified: Secondary | ICD-10-CM | POA: Diagnosis present

## 2013-03-12 DIAGNOSIS — N390 Urinary tract infection, site not specified: Secondary | ICD-10-CM | POA: Diagnosis present

## 2013-03-12 DIAGNOSIS — I2582 Chronic total occlusion of coronary artery: Secondary | ICD-10-CM | POA: Diagnosis present

## 2013-03-12 DIAGNOSIS — I739 Peripheral vascular disease, unspecified: Secondary | ICD-10-CM

## 2013-03-12 DIAGNOSIS — E1149 Type 2 diabetes mellitus with other diabetic neurological complication: Secondary | ICD-10-CM | POA: Diagnosis present

## 2013-03-12 DIAGNOSIS — Z79899 Other long term (current) drug therapy: Secondary | ICD-10-CM

## 2013-03-12 DIAGNOSIS — Z9861 Coronary angioplasty status: Secondary | ICD-10-CM

## 2013-03-12 DIAGNOSIS — I25810 Atherosclerosis of coronary artery bypass graft(s) without angina pectoris: Secondary | ICD-10-CM | POA: Diagnosis present

## 2013-03-12 DIAGNOSIS — I509 Heart failure, unspecified: Secondary | ICD-10-CM | POA: Diagnosis present

## 2013-03-12 DIAGNOSIS — G609 Hereditary and idiopathic neuropathy, unspecified: Secondary | ICD-10-CM | POA: Diagnosis present

## 2013-03-12 DIAGNOSIS — Z7982 Long term (current) use of aspirin: Secondary | ICD-10-CM

## 2013-03-12 DIAGNOSIS — Z789 Other specified health status: Secondary | ICD-10-CM | POA: Diagnosis present

## 2013-03-12 DIAGNOSIS — I729 Aneurysm of unspecified site: Secondary | ICD-10-CM

## 2013-03-12 DIAGNOSIS — R079 Chest pain, unspecified: Secondary | ICD-10-CM | POA: Insufficient documentation

## 2013-03-12 DIAGNOSIS — I255 Ischemic cardiomyopathy: Secondary | ICD-10-CM | POA: Diagnosis present

## 2013-03-12 DIAGNOSIS — I10 Essential (primary) hypertension: Secondary | ICD-10-CM | POA: Diagnosis present

## 2013-03-12 HISTORY — DX: Paroxysmal atrial fibrillation: I48.0

## 2013-03-12 HISTORY — DX: Allergy, unspecified, initial encounter: T78.40XA

## 2013-03-12 LAB — URINALYSIS, ROUTINE W REFLEX MICROSCOPIC
BILIRUBIN URINE: NEGATIVE
Glucose, UA: NEGATIVE mg/dL
Hgb urine dipstick: NEGATIVE
KETONES UR: NEGATIVE mg/dL
Nitrite: NEGATIVE
PROTEIN: NEGATIVE mg/dL
Specific Gravity, Urine: 1.01 (ref 1.005–1.030)
Urobilinogen, UA: 0.2 mg/dL (ref 0.0–1.0)
pH: 6 (ref 5.0–8.0)

## 2013-03-12 LAB — CBC WITH DIFFERENTIAL/PLATELET
BASOS PCT: 0 % (ref 0–1)
Basophils Absolute: 0 10*3/uL (ref 0.0–0.1)
Eosinophils Absolute: 0.1 10*3/uL (ref 0.0–0.7)
Eosinophils Relative: 2 % (ref 0–5)
HCT: 42.8 % (ref 36.0–46.0)
HEMOGLOBIN: 13.9 g/dL (ref 12.0–15.0)
Lymphocytes Relative: 45 % (ref 12–46)
Lymphs Abs: 4.2 10*3/uL — ABNORMAL HIGH (ref 0.7–4.0)
MCH: 29.7 pg (ref 26.0–34.0)
MCHC: 32.5 g/dL (ref 30.0–36.0)
MCV: 91.5 fL (ref 78.0–100.0)
MONO ABS: 0.7 10*3/uL (ref 0.1–1.0)
Monocytes Relative: 8 % (ref 3–12)
NEUTROS PCT: 45 % (ref 43–77)
Neutro Abs: 4.1 10*3/uL (ref 1.7–7.7)
Platelets: 231 10*3/uL (ref 150–400)
RBC: 4.68 MIL/uL (ref 3.87–5.11)
RDW: 15.1 % (ref 11.5–15.5)
WBC: 9.2 10*3/uL (ref 4.0–10.5)

## 2013-03-12 LAB — TROPONIN I: Troponin I: <0.3 ng/mL (ref <0.30)

## 2013-03-12 LAB — PRO B NATRIURETIC PEPTIDE: PRO B NATRI PEPTIDE: 702.3 pg/mL — AB (ref 0–450)

## 2013-03-12 LAB — COMPREHENSIVE METABOLIC PANEL
ALBUMIN: 3.2 g/dL — AB (ref 3.5–5.2)
ALT: 9 U/L (ref 0–35)
AST: 15 U/L (ref 0–37)
Alkaline Phosphatase: 115 U/L (ref 39–117)
BUN: 17 mg/dL (ref 6–23)
CALCIUM: 9.5 mg/dL (ref 8.4–10.5)
CO2: 27 mEq/L (ref 19–32)
CREATININE: 0.87 mg/dL (ref 0.50–1.10)
Chloride: 102 mEq/L (ref 96–112)
GFR calc Af Amer: 71 mL/min — ABNORMAL LOW (ref >90)
GFR calc non Af Amer: 61 mL/min — ABNORMAL LOW (ref >90)
Glucose, Bld: 137 mg/dL — ABNORMAL HIGH (ref 70–99)
POTASSIUM: 4.2 meq/L (ref 3.7–5.3)
Sodium: 142 mEq/L (ref 137–147)
Total Bilirubin: 0.2 mg/dL — ABNORMAL LOW (ref 0.3–1.2)
Total Protein: 7.5 g/dL (ref 6.0–8.3)

## 2013-03-12 LAB — PROTIME-INR
INR: 0.94 (ref 0.00–1.49)
PROTHROMBIN TIME: 12.4 s (ref 11.6–15.2)

## 2013-03-12 LAB — GLUCOSE, CAPILLARY: Glucose-Capillary: 116 mg/dL — ABNORMAL HIGH (ref 70–99)

## 2013-03-12 LAB — URINE MICROSCOPIC-ADD ON

## 2013-03-12 MED ORDER — CEFTRIAXONE SODIUM 1 G IJ SOLR: 1.0000 g | INTRAMUSCULAR | Status: DC

## 2013-03-12 MED ORDER — NITROGLYCERIN 0.4 MG SL SUBL
0.4000 mg | SUBLINGUAL_TABLET | SUBLINGUAL | Status: DC | PRN
  Administered 2013-03-12: 0.4 mg via SUBLINGUAL
  Filled 2013-03-12: qty 25

## 2013-03-12 MED ORDER — ASPIRIN 81 MG PO CHEW: 324.0000 mg | CHEWABLE_TABLET | ORAL | Status: AC

## 2013-03-12 MED ORDER — INSULIN GLARGINE 100 UNIT/ML ~~LOC~~ SOLN
60.0000 [IU] | Freq: Every day | SUBCUTANEOUS | Status: DC
  Administered 2013-03-13 – 2013-03-14 (×3): 60 [IU] via SUBCUTANEOUS
  Filled 2013-03-12 (×4): qty 0.6

## 2013-03-12 MED ORDER — HEPARIN (PORCINE) IN NACL 100-0.45 UNIT/ML-% IJ SOLN
1350.0000 [IU]/h | INTRAMUSCULAR | Status: DC
  Administered 2013-03-12: 900 [IU]/h via INTRAVENOUS
  Administered 2013-03-13: 1150 [IU]/h via INTRAVENOUS
  Administered 2013-03-14: 1350 [IU]/h via INTRAVENOUS
  Filled 2013-03-12 (×4): qty 250

## 2013-03-12 MED ORDER — INSULIN ASPART 100 UNIT/ML ~~LOC~~ SOLN
0.0000 [IU] | Freq: Three times a day (TID) | SUBCUTANEOUS | Status: DC
  Administered 2013-03-13: 5 [IU] via SUBCUTANEOUS
  Administered 2013-03-13 (×2): 3 [IU] via SUBCUTANEOUS
  Administered 2013-03-15: 11 [IU] via SUBCUTANEOUS
  Administered 2013-03-15: 5 [IU] via SUBCUTANEOUS

## 2013-03-12 MED ORDER — NITROGLYCERIN 2 % TD OINT
0.5000 [in_us] | TOPICAL_OINTMENT | Freq: Three times a day (TID) | TRANSDERMAL | Status: AC
  Administered 2013-03-13 (×2): 0.5 [in_us] via TOPICAL
  Filled 2013-03-12: qty 30

## 2013-03-12 MED ORDER — MECLIZINE HCL 25 MG PO TABS
25.0000 mg | ORAL_TABLET | Freq: Two times a day (BID) | ORAL | Status: DC
  Administered 2013-03-13 – 2013-03-15 (×6): 25 mg via ORAL
  Filled 2013-03-12 (×7): qty 1

## 2013-03-12 MED ORDER — LORATADINE 10 MG PO TABS
10.0000 mg | ORAL_TABLET | Freq: Every day | ORAL | Status: DC
  Administered 2013-03-13 – 2013-03-15 (×3): 10 mg via ORAL
  Filled 2013-03-12 (×3): qty 1

## 2013-03-12 MED ORDER — RAMIPRIL 2.5 MG PO CAPS
2.5000 mg | ORAL_CAPSULE | Freq: Every day | ORAL | Status: DC
  Administered 2013-03-13 – 2013-03-14 (×2): 2.5 mg via ORAL
  Filled 2013-03-12 (×2): qty 1

## 2013-03-12 MED ORDER — AMIODARONE HCL 200 MG PO TABS
200.0000 mg | ORAL_TABLET | Freq: Every day | ORAL | Status: DC
  Administered 2013-03-13 – 2013-03-15 (×3): 200 mg via ORAL
  Filled 2013-03-12 (×3): qty 1

## 2013-03-12 MED ORDER — ASPIRIN EC 81 MG PO TBEC
81.0000 mg | DELAYED_RELEASE_TABLET | Freq: Every day | ORAL | Status: DC
  Administered 2013-03-13 – 2013-03-15 (×3): 81 mg via ORAL
  Filled 2013-03-12 (×3): qty 1

## 2013-03-12 MED ORDER — INSULIN ASPART 100 UNIT/ML ~~LOC~~ SOLN
15.0000 [IU] | Freq: Three times a day (TID) | SUBCUTANEOUS | Status: DC
  Administered 2013-03-13 – 2013-03-15 (×7): 15 [IU] via SUBCUTANEOUS

## 2013-03-12 MED ORDER — ASPIRIN EC 81 MG PO TBEC: 81.0000 mg | DELAYED_RELEASE_TABLET | Freq: Every morning | ORAL | Status: DC

## 2013-03-12 MED ORDER — FLUTICASONE PROPIONATE 50 MCG/ACT NA SUSP: 1.0000 | Freq: Every day | NASAL | Status: DC | PRN

## 2013-03-12 MED ORDER — HEPARIN BOLUS VIA INFUSION
4000.0000 [IU] | Freq: Once | INTRAVENOUS | Status: AC
  Administered 2013-03-12: 4000 [IU] via INTRAVENOUS

## 2013-03-12 MED ORDER — EZETIMIBE 10 MG PO TABS
10.0000 mg | ORAL_TABLET | Freq: Every day | ORAL | Status: DC
  Administered 2013-03-13 – 2013-03-15 (×3): 10 mg via ORAL
  Filled 2013-03-12 (×3): qty 1

## 2013-03-12 MED ORDER — DEXTROSE 5 % IV SOLN
1.0000 g | INTRAVENOUS | Status: DC
  Administered 2013-03-13 – 2013-03-14 (×2): 1 g via INTRAVENOUS
  Filled 2013-03-12 (×3): qty 10

## 2013-03-12 MED ORDER — FUROSEMIDE 40 MG PO TABS
40.0000 mg | ORAL_TABLET | Freq: Every day | ORAL | Status: DC
  Administered 2013-03-13: 40 mg via ORAL
  Filled 2013-03-12: qty 1

## 2013-03-12 MED ORDER — ISOSORBIDE MONONITRATE ER 30 MG PO TB24
30.0000 mg | ORAL_TABLET | Freq: Every morning | ORAL | Status: DC
  Administered 2013-03-13 – 2013-03-15 (×3): 30 mg via ORAL
  Filled 2013-03-12 (×3): qty 1

## 2013-03-12 MED ORDER — DEXTROSE 5 % IV SOLN
1.0000 g | Freq: Once | INTRAVENOUS | Status: AC
  Administered 2013-03-12: 1 g via INTRAVENOUS
  Filled 2013-03-12: qty 10

## 2013-03-12 MED ORDER — FUROSEMIDE 10 MG/ML IJ SOLN
20.0000 mg | Freq: Once | INTRAMUSCULAR | Status: AC
  Administered 2013-03-13: 20 mg via INTRAVENOUS

## 2013-03-12 MED ORDER — ACETAMINOPHEN 500 MG PO TABS
1000.0000 mg | ORAL_TABLET | Freq: Four times a day (QID) | ORAL | Status: DC | PRN
  Administered 2013-03-12 – 2013-03-15 (×4): 1000 mg via ORAL
  Filled 2013-03-12 (×4): qty 2

## 2013-03-12 MED ORDER — ASPIRIN 300 MG RE SUPP
300.0000 mg | RECTAL | Status: AC
  Filled 2013-03-12: qty 1

## 2013-03-12 MED ORDER — CARVEDILOL 25 MG PO TABS
25.0000 mg | ORAL_TABLET | Freq: Two times a day (BID) | ORAL | Status: DC
  Administered 2013-03-13 – 2013-03-15 (×4): 25 mg via ORAL
  Filled 2013-03-12 (×7): qty 1

## 2013-03-12 MED ORDER — MORPHINE SULFATE 2 MG/ML IJ SOLN
2.0000 mg | Freq: Once | INTRAMUSCULAR | Status: AC
Start: 2013-03-12 — End: 2013-03-12
  Administered 2013-03-12: 2 mg via INTRAVENOUS
  Filled 2013-03-12: qty 1

## 2013-03-12 MED ORDER — LEVOTHYROXINE SODIUM 25 MCG PO TABS
25.0000 ug | ORAL_TABLET | Freq: Every day | ORAL | Status: DC
  Administered 2013-03-13 – 2013-03-15 (×3): 25 ug via ORAL
  Filled 2013-03-12 (×4): qty 1

## 2013-03-12 MED ORDER — PROMETHAZINE HCL 25 MG PO TABS: 25.0000 mg | ORAL_TABLET | Freq: Four times a day (QID) | ORAL | Status: DC | PRN

## 2013-03-12 MED ORDER — INSULIN ASPART 100 UNIT/ML ~~LOC~~ SOLN
0.0000 [IU] | Freq: Every day | SUBCUTANEOUS | Status: DC
  Administered 2013-03-14: 5 [IU] via SUBCUTANEOUS

## 2013-03-12 NOTE — Telephone Encounter (Signed)
Patient called with epigastric pain, weakness and shortness of breath since afternoon on 03/12/2013. Symptoms were getting progressively worse. Pt reported that this felt like her previous heart attack. Patient was advised to call 911 immediately.

## 2013-03-12 NOTE — ED Provider Notes (Signed)
CSN: [id number]     Arrival date & time 03/12/13  1820 History   This chart was scribed for Cathy Octave, MD by Bennett Scrape, ED Scribe. This patient was seen in room APA05/APA05 and the patient's care was started at 6:32 PM.    Chief Complaint  Patient presents with  . Chest Pain     The history is provided by the patient. No language interpreter was used.    HPI Comments: Cathy [name] is a 80 y.o. female who presents to the Emergency Department by ambulance from home complaining of sudden onset left sided CP with mild radiation into her left arm with associated SOB that started while she was pushing her walker to the bathroom PTA. She reports that she took one NTG at home but denies improvement stating that "time made it better". She states that she currently has mild chest heaviness. She was started on O2 by EMS and denies being on O2 at home. She reports that the O2 has improved her SOB, but she still doesn't feel "up to par". She admits that she has been experienced brief episodes of the same pain over the past few days but "didn't think nothing of it". She states that she has experienced similar pain before and reports that this episode was much longer than normal. She is unsure of when her last episode was prior to today. She also reports some epigastric abdominal pain during the episode that has since resolved. She reports one prior episode of the same attributed to gallstones. She has not had a cholecystectomy. She denies any nausea or recent cough. She denies any recent sick contacts stating that she lives alone.  She has a h/o cardiac bypass and 3 stents and is currently on anticoagulants. She admits to getting an influenza and pneumonia vaccine this year.    Dr. Myrtis Ser is Cards.  Past Medical History  Diagnosis Date  . Hypertension     Unspecified  . Hyperlipidemia     Mixed, statin intolerance.  [name] CAD (coronary artery disease) 08/21/09    a. CAD s/p CABG 2003. b. Cath 2006:  occluded OM vein graft. c. Anterior MI 04/15/12 s/p PTCA only to ramus (stenting unsuccessful at that time) d. NSTEMI 06/2012: failed med rx, s/p DES to ramus c/b vessel perforation tx with balloon/stenting. Plavix hyporesponsive so changed to Brilinta.  . S/P CABG (coronary artery bypass graft) 2003     2003  LIMA, LAD, SVG to the OM, SVG right coronary artery  . Thoracic ascending aortic aneurysm 2003    Ascending thoracic aneurysm repair with a graft at time of CABG  . Diabetes mellitus     Insulin dependent  . Gastroparesis   . Depression   . Allergy history, radiographic dye     Contrast dye allergy  . Previous back surgery   . Syncope 2008    Question?  2008  . Chronic systolic heart failure   . Ischemic cardiomyopathy 06/2011    a. Echo 04/19/12: EF 25-30%, mod LVH, diffuse HK, trivial AI  . Hypotension     October, 2013  . Hypothyroidism     Treated with low-dose Synthroid  . CKD (chronic kidney disease)   . Carotid artery disease     a. 80-99% prox RICA stenosis - with recent cardiac event 06/2012, favor waiting at least 1 month, optimally 3 months post PCI.  [name] Hypertriglyceridemia   . Statin intolerance   . Peripheral neuropathy   . Atrial fibrillation  a. Noted during 06/2012 hospitalization. Placed on amiodarone. Plan is for event monitor to assess for breakthrough, hold off anticoag unless additional AF seen.  [name] NSVT (nonsustained ventricular tachycardia)     a. Noted during 06/2012 hospitalization.   Past Surgical History  Procedure Laterality Date  . Coronary artery bypass graft  05/05/2001     LIMA, LAD, SVG to the OM, SVG right coronary artery  . Bladder tacking    . Salivary gland surgery    . Total abdominal hysterectomy    . Back surgery    . Breast surgery    . External ear surgery    . Cardiac catheterization  04/15/12, 07/05/12    60-70% proximal LAD, 95% mid AV groove circumflex s/p PTCA, proximal RCA occlusion, patent LIMA to LAD, patent SVG to distal  RCA, chronically occluded SVG to circumflex; LVEF 25-30%  . Ercp N/A 11/04/2012    Procedure: ENDOSCOPIC RETROGRADE CHOLANGIOPANCREATOGRAPHY (ERCP);  Surgeon: Petra Kuba, MD;  Location: [facility] OR;  Service: Endoscopy;  Laterality: N/A;  AAA repair per pt at beside.  Family History  Problem Relation Age of Onset  . Heart disease Other   . Heart disease Mother   . Hypertension Mother   . Heart disease Father   . Hypertension Father    History  Substance Use Topics  . Smoking status: Never Smoker   . Smokeless tobacco: Never Used  . Alcohol Use: 1.5 oz/week    3 drink(s) per week   No OB history provided.  Review of Systems  A complete 10 system review of systems was obtained and all systems are negative except as noted in the HPI and PMH.    Allergies  Brilinta; Iohexol; Atorvastatin; Citrus; Contrast media; Plavix; Zolpidem tartrate; and Milk-related compounds  Home Medications   Current Outpatient Rx  Name  Route  Sig  Dispense  Refill  . acetaminophen (TYLENOL) 500 MG tablet   Oral   Take 1,000 mg by mouth every 6 (six) hours as needed for pain.          [name] amiodarone (PACERONE) 200 MG tablet   Oral   Take 1 tablet (200 mg total) by mouth daily.   30 tablet   3   . aspirin EC 81 MG tablet   Oral   Take 81 mg by mouth every morning.         . carvedilol (COREG) 25 MG tablet   Oral   Take 1 tablet (25 mg total) by mouth 2 (two) times daily.   60 tablet   6   . cetirizine (ZYRTEC) 10 MG tablet   Oral   Take 10 mg by mouth daily.         . fluticasone (FLONASE) 50 MCG/ACT nasal spray   Nasal   Place 1 spray into the nose daily as needed for allergies.         . folic acid (FOLVITE) 400 MCG tablet   Oral   Take 400 mcg by mouth daily.         . furosemide (LASIX) 40 MG tablet   Oral   Take 1 tablet (40 mg total) by mouth daily.   30 tablet   3   . insulin glargine (LANTUS) 100 UNIT/ML injection   Subcutaneous   Inject 60 Units into the skin  at bedtime.         . insulin lispro (HUMALOG) 100 UNIT/ML injection   Subcutaneous   Inject 11-14 Units into  the skin 3 (three) times daily before meals. Sliding scale as you were doing before admission to the hospital.         . isosorbide mononitrate (IMDUR) 30 MG 24 hr tablet   Oral   Take 1 tablet (30 mg total) by mouth every morning.   30 tablet   3   . levothyroxine (SYNTHROID, LEVOTHROID) 25 MCG tablet   Oral   Take 25 mcg by mouth daily before breakfast.         . meclizine (ANTIVERT) 25 MG tablet   Oral   Take 1 tablet (25 mg total) by mouth 2 (two) times daily.   30 tablet   1   . promethazine (PHENERGAN) 25 MG tablet   Oral   Take 1 tablet (25 mg total) by mouth every 6 (six) hours as needed for nausea.   30 tablet   2   . ramipril (ALTACE) 2.5 MG capsule   Oral   Take 1 capsule (2.5 mg total) by mouth daily.   30 capsule   6   . tamsulosin (FLOMAX) 0.4 MG CAPS capsule   Oral   Take 1 capsule (0.4 mg total) by mouth daily.   30 capsule   1   . nitroGLYCERIN (NITROSTAT) 0.4 MG SL tablet   Sublingual   Place 0.4 mg under the tongue every 5 (five) minutes as needed for chest pain. May repeat times three.          Triage Vitals: BP 127/63  Pulse 68  Temp(Src) 98.4 F (36.9 C) (Oral)  Resp 20  SpO2 98%  Physical Exam  Nursing note and vitals reviewed. Constitutional: She is oriented to person, place, and time. She appears well-developed and well-nourished. No distress.  HENT:  Head: Normocephalic and atraumatic.  Eyes: Conjunctivae and EOM are normal. Pupils are equal, round, and reactive to light.  Neck: Neck supple. No tracheal deviation present.  Cardiovascular: Normal rate and regular rhythm.   Murmur heard. Pulmonary/Chest: Effort normal and breath sounds normal. No respiratory distress. She exhibits tenderness (left chest wall tenderness ).  Abdominal: Soft. There is no tenderness. There is no rebound and no guarding.   Musculoskeletal: Normal range of motion.  Equal and intact DP and PT peripheral pulses, no peripheral edema  Neurological: She is alert and oriented to person, place, and time.  CN 2-12 intact, 5/5 strength throughout  Skin: Skin is warm and dry.  Psychiatric: She has a normal mood and affect. Her behavior is normal.    ED Course  Procedures (including critical care time)  DIAGNOSTIC STUDIES: Oxygen Saturation is 98% on RA, normal by my interpretation.    COORDINATION OF CARE: 6:39 PM-Informed pt of radiology and lab work results. Discussed discharge plan which includes CXR, CBC, CMP and UA with pt and pt agreed to plan. Also advised pt to follow up as needed and pt agreed. Addressed symptoms to return for with pt.   7:47 PM-Consult complete with Cardiology. Patient case explained and discussed. Cardiology agrees to admit patient to [facility] Tele for further evaluation and treatment. Call ended at 7:49 PM.  8:16 PM-Pt updated about transfer and is agreeable.  Labs Review Labs Reviewed  CBC WITH DIFFERENTIAL - Abnormal; Notable for the following:    Lymphs Abs 4.2 (*)    All other components within normal limits  COMPREHENSIVE METABOLIC PANEL - Abnormal; Notable for the following:    Glucose, Bld 137 (*)    Albumin 3.2 (*)  Total Bilirubin 0.2 (*)    GFR calc non Af Amer 61 (*)    GFR calc Af Amer 71 (*)    All other components within normal limits  PRO B NATRIURETIC PEPTIDE - Abnormal; Notable for the following:    Pro B Natriuretic peptide (BNP) 702.3 (*)    All other components within normal limits  URINALYSIS, ROUTINE W REFLEX MICROSCOPIC - Abnormal; Notable for the following:    APPearance CLOUDY (*)    Leukocytes, UA SMALL (*)    All other components within normal limits  URINE MICROSCOPIC-ADD ON - Abnormal; Notable for the following:    Bacteria, UA MANY (*)    All other components within normal limits  URINE CULTURE  PROTIME-INR  TROPONIN I   Imaging  Review Dg Chest Portable 1 View  03/12/2013   CLINICAL DATA:  Chest pain, coronary artery disease post CABG, diabetes, atrial fibrillation, hypertension, diabetes  EXAM: PORTABLE CHEST - 1 VIEW  COMPARISON:  Portable exam 1852 hr compared to 10/31/2012  FINDINGS: Normal heart size post CABG.  Atherosclerotic calcification aorta.  Mediastinal contours and pulmonary vascularity normal.  No acute infiltrate, pleural effusion or pneumothorax.  Bones demineralized.  Bilateral glenohumeral degenerative changes.  IMPRESSION: Post CABG.  No acute abnormalities.   Electronically Signed   By: Ulyses SouthwardMark  Boles M.D.   On: 03/12/2013 19:23    EKG Interpretation    Date/Time:  Saturday March 12 2013 18:24:23 EST Ventricular Rate:  68 PR Interval:  168 QRS Duration: 118 QT Interval:  424 QTC Calculation: 450 R Axis:   -35 Text Interpretation:  Normal sinus rhythm Left axis deviation Left ventricular hypertrophy with QRS widening and repolarization abnormality Cannot rule out Septal infarct (cited on or before 07-Jul-2012) Abnormal ECG When compared with ECG of 01-Nov-2012 04:36, No significant change was found No significant change was found Confirmed by Manus GunningANCOUR  MD, Nary Sneed ([id number]) on 03/12/2013 6:42:30 PM            MDM   Final diagnoses:  Unstable angina   episode of central chest pain radiating to left arm today while exerting herself with her walker. Associated with shortness of breath and nausea. Similar to previous MI. Patient with history of previous CABG and thoracic aneurysm repair.  EKG unchanged.  Troponin negative. Patient remains chest pain free in the ED. Patient received aspirin. She started on heparin drip for unstable angina.  She has no ongoing chest or back pain. She has equal peripheral pulses and grip strength. Equal upper extremity blood pressures. Low suspicion for aortic dissection or PE.  Discussed with cardiology fellow Dr. Hiram ComberLishmanov who [facility]. She remains chest pain free in the ED with stable vitals at time of transfer.  BP 124/58  Pulse 70  Temp(Src) 98.8 F (37.1 C) (Oral)  Resp 15  Ht 5\' 5"  (1.651 m)  Wt 198 lb 14.4 oz (90.22 kg)  BMI 33.10 kg/m2  SpO2 95%   Stress test 10/14: IMPRESSION: 1. Findings worrisome for prior infarctions involving the inferior wall, apical aspect of the septum and basilar aspect of the lateral wall. Given background of suspected prior infarctions, there is no definitive scintigraphic evidence of pharmacologically induced ischemia. 2. Mildly dilated left ventricle with geographic areas of hypokinesia/akinesia involving the suspected areas of prior infarction as detailed above. Ejection fraction - 41%  CRITICAL CARE Performed by: Cathy OctaveANCOUR, Laikyn Gewirtz Total critical care time: 30 Critical care time was exclusive of separately billable procedures  and treating other patients. Critical care was necessary to treat or prevent imminent or life-threatening deterioration. Critical care was time spent personally by me on the following activities: development of treatment plan with patient and/or surrogate as well as nursing, discussions with consultants, evaluation of patient's response to treatment, examination of patient, obtaining history from patient or surrogate, ordering and performing treatments and interventions, ordering and review of laboratory studies, ordering and review of radiographic studies, pulse oximetry and re-evaluation of patient's condition.  I personally performed the services described in this documentation, which was scribed in my presence. The recorded information has been reviewed and is accurate.   Cathy Octave, MD 03/13/13 0010

## 2013-03-12 NOTE — Progress Notes (Signed)
ANTICOAGULATION CONSULT NOTE - Initial Consult  Pharmacy Consult for Heparin Indication: chest pain/ACS  Allergies  Allergen Reactions  . Brilinta [Ticagrelor] Shortness Of Breath and Nausea And Vomiting  . Iohexol Anaphylaxis       . Atorvastatin Other (See Comments)    Unknown-patient admitted to hospital after taking  . Citrus Dermatitis    BREAK OUT ON BODY  . Contrast Media [Iodinated Diagnostic Agents] Other (See Comments)    Passed out  . Plavix [Clopidogrel Bisulfate]     Not an allergy - but PRU was drawn 06/2012 indicating Plavix hyporesponsiveness, thus she was changed to Brilinta.  . Zolpidem Tartrate Other (See Comments)    Hallucinations  . Milk-Related Compounds Rash    Patient Measurements:   Heparin Dosing Weight: 76.6 kg  Vital Signs: Temp: 98.4 F (36.9 C) (02/21 1826) Temp src: Oral (02/21 1826) BP: 116/49 mmHg (02/21 1859) Pulse Rate: 68 (02/21 1857)  Labs:  Recent Labs  03/12/13 1830  HGB 13.9  HCT 42.8  PLT 231  LABPROT 12.4  INR 0.94  CREATININE 0.87  TROPONINI <0.30    The CrCl is unknown because both a height and weight (above a minimum accepted value) are required for this calculation.   Medical History: Past Medical History  Diagnosis Date  . Hypertension     Unspecified  . Hyperlipidemia     Mixed, statin intolerance.  [name] CAD (coronary artery disease) 08/21/09    a. CAD s/p CABG 2003. b. Cath 2006: occluded OM vein graft. c. Anterior MI 04/15/12 s/p PTCA only to ramus (stenting unsuccessful at that time) d. NSTEMI 06/2012: failed med rx, s/p DES to ramus c/b vessel perforation tx with balloon/stenting. Plavix hyporesponsive so changed to Brilinta.  . S/P CABG (coronary artery bypass graft) 2003     2003  LIMA, LAD, SVG to the OM, SVG right coronary artery  . Thoracic ascending aortic aneurysm 2003    Ascending thoracic aneurysm repair with a graft at time of CABG  . Diabetes mellitus     Insulin dependent  . Gastroparesis   .  Depression   . Allergy history, radiographic dye     Contrast dye allergy  . Previous back surgery   . Syncope 2008    Question?  2008  . Chronic systolic heart failure   . Ischemic cardiomyopathy 06/2011    a. Echo 04/19/12: EF 25-30%, mod LVH, diffuse HK, trivial AI  . Hypotension     October, 2013  . Hypothyroidism     Treated with low-dose Synthroid  . CKD (chronic kidney disease)   . Carotid artery disease     a. 80-99% prox RICA stenosis - with recent cardiac event 06/2012, favor waiting at least 1 month, optimally 3 months post PCI.  [name] Hypertriglyceridemia   . Statin intolerance   . Peripheral neuropathy   . Atrial fibrillation     a. Noted during 06/2012 hospitalization. Placed on amiodarone. Plan is for event monitor to assess for breakthrough, hold off anticoag unless additional AF seen.  [name] NSVT (nonsustained ventricular tachycardia)     a. Noted during 06/2012 hospitalization.    Medications:  Scheduled:    Assessment: Chest pain, similar to patient's previous MI Patient approximate weight 89.5 kg PTA medications reviewed Labs reviewed  Goal of Therapy:  Heparin level 0.3-0.7 units/ml Monitor platelets by anticoagulation protocol: Yes   Plan:  Give 4000 units bolus x 1 Start heparin infusion at 900 units/hr Check anti-Xa level in 8  hours and daily while on heparin Continue to monitor H&H and platelets  Raquel Jamesittman, Kjuan Seipp Bennett 03/12/2013,8:23 PM

## 2013-03-12 NOTE — Progress Notes (Signed)
Pt arrived [facility] via [facility].  Pt denies c/o pain.  Only c/o per pt is that she is hungry.  Pt NSR on tele, vitals and CBG being obtained now.  MD paged to notify of pt arrival.  Will continue to monitor.

## 2013-03-12 NOTE — ED Notes (Signed)
Chest pain onset today, feeling of fullness in chest per ems

## 2013-03-13 DIAGNOSIS — I200 Unstable angina: Secondary | ICD-10-CM

## 2013-03-13 LAB — HEPARIN LEVEL (UNFRACTIONATED)
HEPARIN UNFRACTIONATED: 0.11 [IU]/mL — AB (ref 0.30–0.70)
Heparin Unfractionated: 0.1 IU/mL — ABNORMAL LOW (ref 0.30–0.70)

## 2013-03-13 LAB — BASIC METABOLIC PANEL
BUN: 19 mg/dL (ref 6–23)
CO2: 24 mEq/L (ref 19–32)
Calcium: 9.3 mg/dL (ref 8.4–10.5)
Chloride: 102 mEq/L (ref 96–112)
Creatinine, Ser: 0.91 mg/dL (ref 0.50–1.10)
GFR calc Af Amer: 67 mL/min — ABNORMAL LOW (ref >90)
GFR, EST NON AFRICAN AMERICAN: 58 mL/min — AB (ref >90)
GLUCOSE: 173 mg/dL — AB (ref 70–99)
Potassium: 4.1 mEq/L (ref 3.7–5.3)
Sodium: 141 mEq/L (ref 137–147)

## 2013-03-13 LAB — PROTIME-INR
INR: 0.95 (ref 0.00–1.49)
Prothrombin Time: 12.5 seconds (ref 11.6–15.2)

## 2013-03-13 LAB — LIPID PANEL
CHOL/HDL RATIO: 5.9 ratio
Cholesterol: 194 mg/dL (ref 0–200)
HDL: 33 mg/dL — AB (ref >39)
LDL CALC: UNDETERMINED mg/dL (ref 0–99)
Triglycerides: 606 mg/dL — ABNORMAL HIGH (ref <150)
VLDL: UNDETERMINED mg/dL (ref 0–40)

## 2013-03-13 LAB — HEMOGLOBIN A1C
Hgb A1c MFr Bld: 8.6 % — ABNORMAL HIGH (ref <5.7)
Mean Plasma Glucose: 200 mg/dL — ABNORMAL HIGH (ref <117)

## 2013-03-13 LAB — CBC
HEMATOCRIT: 43.6 % (ref 36.0–46.0)
HEMOGLOBIN: 14.4 g/dL (ref 12.0–15.0)
MCH: 30.2 pg (ref 26.0–34.0)
MCHC: 33 g/dL (ref 30.0–36.0)
MCV: 91.4 fL (ref 78.0–100.0)
Platelets: 220 10*3/uL (ref 150–400)
RBC: 4.77 MIL/uL (ref 3.87–5.11)
RDW: 15.2 % (ref 11.5–15.5)
WBC: 10.5 10*3/uL (ref 4.0–10.5)

## 2013-03-13 LAB — TROPONIN I: Troponin I: <0.3 ng/mL (ref <0.30)

## 2013-03-13 LAB — GLUCOSE, CAPILLARY
GLUCOSE-CAPILLARY: 231 mg/dL — AB (ref 70–99)
Glucose-Capillary: 166 mg/dL — ABNORMAL HIGH (ref 70–99)
Glucose-Capillary: 151 mg/dL — ABNORMAL HIGH (ref 70–99)
Glucose-Capillary: 183 mg/dL — ABNORMAL HIGH (ref 70–99)

## 2013-03-13 LAB — TSH: TSH: 1.687 u[IU]/mL (ref 0.350–4.500)

## 2013-03-13 MED ORDER — DIPHENHYDRAMINE HCL 50 MG/ML IJ SOLN
25.0000 mg | INTRAMUSCULAR | Status: AC
  Administered 2013-03-14: 25 mg via INTRAVENOUS
  Filled 2013-03-13: qty 1

## 2013-03-13 MED ORDER — FUROSEMIDE 10 MG/ML IJ SOLN
INTRAMUSCULAR | Status: AC
  Administered 2013-03-13: 20 mg via INTRAVENOUS
  Filled 2013-03-13: qty 4

## 2013-03-13 MED ORDER — SODIUM CHLORIDE 0.9 % IV SOLN: 1.0000 mL/kg/h | INTRAVENOUS | Status: DC

## 2013-03-13 MED ORDER — HEPARIN BOLUS VIA INFUSION
2000.0000 [IU] | Freq: Once | INTRAVENOUS | Status: AC
  Administered 2013-03-13: 2000 [IU] via INTRAVENOUS
  Filled 2013-03-13: qty 2000

## 2013-03-13 MED ORDER — FAMOTIDINE 20 MG PO TABS
20.0000 mg | ORAL_TABLET | ORAL | Status: AC
  Administered 2013-03-13: 20 mg via ORAL
  Filled 2013-03-13: qty 1

## 2013-03-13 MED ORDER — PREDNISONE 50 MG PO TABS
60.0000 mg | ORAL_TABLET | ORAL | Status: AC
  Administered 2013-03-14: 60 mg via ORAL
  Filled 2013-03-13: qty 1

## 2013-03-13 MED ORDER — SODIUM CHLORIDE 0.9 % IV SOLN: 250.0000 mL | INTRAVENOUS | Status: DC | PRN

## 2013-03-13 MED ORDER — HEPARIN BOLUS VIA INFUSION
2500.0000 [IU] | Freq: Once | INTRAVENOUS | Status: AC
  Administered 2013-03-13: 2500 [IU] via INTRAVENOUS
  Filled 2013-03-13: qty 2500

## 2013-03-13 MED ORDER — ASPIRIN 81 MG PO CHEW
81.0000 mg | CHEWABLE_TABLET | ORAL | Status: AC
  Administered 2013-03-14: 81 mg via ORAL
  Filled 2013-03-13: qty 1

## 2013-03-13 MED ORDER — SODIUM CHLORIDE 0.9 % IJ SOLN
3.0000 mL | Freq: Two times a day (BID) | INTRAMUSCULAR | Status: DC
  Administered 2013-03-13 – 2013-03-14 (×2): 3 mL via INTRAVENOUS

## 2013-03-13 MED ORDER — SODIUM CHLORIDE 0.9 % IJ SOLN: 3.0000 mL | INTRAMUSCULAR | Status: DC | PRN

## 2013-03-13 MED ORDER — PREDNISONE 50 MG PO TABS
60.0000 mg | ORAL_TABLET | ORAL | Status: AC
  Administered 2013-03-13: 60 mg via ORAL
  Filled 2013-03-13: qty 1

## 2013-03-13 NOTE — Progress Notes (Signed)
ANTICOAGULATION CONSULT NOTE - Follow Up Consult  Pharmacy Consult for heparin Indication: chest pain/ACS  Allergies  Allergen Reactions  . Brilinta [Ticagrelor] Shortness Of Breath and Nausea And Vomiting  . Iohexol Anaphylaxis       . Atorvastatin Other (See Comments)    Unknown-patient admitted to hospital after taking  . Citrus Dermatitis    BREAK OUT ON BODY  . Contrast Media [Iodinated Diagnostic Agents] Other (See Comments)    Passed out  . Plavix [Clopidogrel Bisulfate]     Not an allergy - but PRU was drawn 06/2012 indicating Plavix hyporesponsiveness, thus she was changed to Brilinta.  . Zolpidem Tartrate Other (See Comments)    Hallucinations  . Milk-Related Compounds Rash    Patient Measurements: Height: 5\' 5"  (165.1 cm) Weight: 198 lb 3.2 oz (89.903 kg) IBW/kg (Calculated) : 57 Heparin Dosing Weight: 77kg  Vital Signs: Temp: 98 F (36.7 C) (02/22 1331) Temp src: Oral (02/22 1331) BP: 100/52 mmHg (02/22 1331) Pulse Rate: 96 (02/22 1331)  Labs:  Recent Labs  03/12/13 1830 03/13/13 0026 03/13/13 0500 03/13/13 0530 03/13/13 0550 03/13/13 1250 03/13/13 1715  HGB 13.9  --  14.4  --   --   --   --   HCT 42.8  --  43.6  --   --   --   --   PLT 231  --  220  --   --   --   --   LABPROT 12.4  --   --   --  12.5  --   --   INR 0.94  --   --   --  0.95  --   --   HEPARINUNFRC  --   --   --   --  0.10*  --  0.11*  CREATININE 0.87  --   --   --  0.91  --   --   TROPONINI <0.30 <0.30  --  <0.30  --  <0.30  --     Estimated Creatinine Clearance: 54.6 ml/min (by C-G formula based on Cr of 0.91).  Medications Heparin at 1150 units/hr  Assessment: 82 YOF [facility] with chest pain on heparin. Heparin level was low this morning and she was bolused and rate was increased. A recheck was still low at 0.11 units/mL this evening. Per RN Noreene LarssonJill, no issues with line, drip was not stopped, and it is running at the ordered rate. Hgb this morning 14.4,  plts 220. No bleeding noted.  Goal of Therapy:  Heparin level 0.3-0.7 units/ml Monitor platelets by anticoagulation protocol: Yes   Plan:  1. Rebolus with 2500 units heparin IV x1 2. Increase heparin drip to 1350 units/hr 3. Next heparin level with AM labs 4. Daily heparin level and CBC 5. Follow for cath plans and s/s bleeding  Cathalina Barcia D. Nettye Flegal, PharmD, BCPS Clinical Pharmacist Pager: [phone] 03/13/2013 7:35 PM

## 2013-03-13 NOTE — Progress Notes (Signed)
Pharmacy Note-Anticoagulation  Pharmacy Consult :  80 y.o. female is currently on Heparin infusion for ACS/Chest pain..   Latest Labs : Hematology :  Recent Labs  03/12/13 1830 03/13/13 0500 03/13/13 0550  HGB 13.9 14.4  --   HCT 42.8 43.6  --   PLT 231 220  --   LABPROT 12.4  --  12.5  INR 0.94  --  0.95  HEPARINUNFRC  --   --  0.10*  CREATININE 0.87  --  0.91    Lab Results  Component Value Date   INR 0.95 03/13/2013   INR 0.94 03/12/2013        HEPARINUNFRC 0.10* 03/13/2013        HGB 14.4 03/13/2013   HGB 13.9 03/12/2013    Current Medication[s] Include: Medication PTA: Prescriptions prior to admission  Medication Sig Dispense Refill  . acetaminophen (TYLENOL) 500 MG tablet Take 1,000 mg by mouth every 6 (six) hours as needed for pain.       [name] amiodarone (PACERONE) 200 MG tablet Take 1 tablet (200 mg total) by mouth daily.  30 tablet  3  . aspirin EC 81 MG tablet Take 81 mg by mouth every morning.      . carvedilol (COREG) 25 MG tablet Take 1 tablet (25 mg total) by mouth 2 (two) times daily.  60 tablet  6  . cetirizine (ZYRTEC) 10 MG tablet Take 10 mg by mouth daily.      . fluticasone (FLONASE) 50 MCG/ACT nasal spray Place 1 spray into the nose daily as needed for allergies.      . folic acid (FOLVITE) 400 MCG tablet Take 400 mcg by mouth daily.      . furosemide (LASIX) 40 MG tablet Take 1 tablet (40 mg total) by mouth daily.  30 tablet  3  . insulin glargine (LANTUS) 100 UNIT/ML injection Inject 60 Units into the skin at bedtime.      . insulin lispro (HUMALOG) 100 UNIT/ML injection Inject 11-14 Units into the skin 3 (three) times daily before meals. Sliding scale as you were doing before admission to the hospital.      . isosorbide mononitrate (IMDUR) 30 MG 24 hr tablet Take 1 tablet (30 mg total) by mouth every morning.  30 tablet  3  . levothyroxine (SYNTHROID, LEVOTHROID) 25 MCG tablet Take 25 mcg by mouth daily before breakfast.      . meclizine (ANTIVERT) 25  MG tablet Take 1 tablet (25 mg total) by mouth 2 (two) times daily.  30 tablet  1  . promethazine (PHENERGAN) 25 MG tablet Take 1 tablet (25 mg total) by mouth every 6 (six) hours as needed for nausea.  30 tablet  2  . ramipril (ALTACE) 2.5 MG capsule Take 1 capsule (2.5 mg total) by mouth daily.  30 capsule  6  . tamsulosin (FLOMAX) 0.4 MG CAPS capsule Take 1 capsule (0.4 mg total) by mouth daily.  30 capsule  1  . nitroGLYCERIN (NITROSTAT) 0.4 MG SL tablet Place 0.4 mg under the tongue every 5 (five) minutes as needed for chest pain. May repeat times three.       Scheduled:  Scheduled:  . amiodarone  200 mg Oral Daily  . aspirin  324 mg Oral NOW   Or  . aspirin  300 mg Rectal NOW  . aspirin EC  81 mg Oral Daily  . carvedilol  25 mg Oral BID WC  . cefTRIAXone (ROCEPHIN)  IV  1 g  Intravenous Q24H  . ezetimibe  10 mg Oral Daily  . furosemide  40 mg Oral Daily  . insulin aspart  0-15 Units Subcutaneous TID WC  . insulin aspart  0-5 Units Subcutaneous QHS  . insulin aspart  15 Units Subcutaneous TID WC  . insulin glargine  60 Units Subcutaneous QHS  . isosorbide mononitrate  30 mg Oral q morning - 10a  . levothyroxine  25 mcg Oral QAC breakfast  . loratadine  10 mg Oral Daily  . meclizine  25 mg Oral BID  . ramipril  2.5 mg Oral Daily   Infusion[s]: Infusions:  . heparin 900 Units/hr (03/12/13 2029)   Antibiotic[s]: Anti-infectives   Start     Dose/Rate Route Frequency Ordered Stop   03/13/13 2000  cefTRIAXone (ROCEPHIN) 1 g in dextrose 5 % 50 mL IVPB     1 g 100 mL/hr over 30 Minutes Intravenous Every 24 hours 03/12/13 2344        Assessment :  First Heparin level is sub-therapeutic, 0.1 unit/ml.  No bleeding complications observed.  Goal :  Heparin goal is Heparin level 0.3-0.7 units/ml.  Plan : 1. Heparin Bolus 2000 units x 1, then will increase infusion to 1150 units/hr.   The next Heparin Level will be due in 8 hours @ 1700 pm 2. Daily Heparin level, CBC while on  Heparin.  Monitor for bleeding complications.   Amyrah Pinkhasov, Elisha HeadlandEarle J, Pharm.D. 03/13/2013  7:47 AM

## 2013-03-13 NOTE — H&P (Signed)
PCP:   Milinda AntisURHAM, KAWANTA, MD   Primary Cardiologist: Dr. Myrtis SerKatz  Chief Complaint: multiple (CP, SOB, weakness)   HPI: This is a 80 year old Caucasian female with past medical history of diabetes mellitus type 2, coronary artery disease s/p CABG and multiple interventions, systolic congestive heart failure, hyperlipidemia, hypertension who presented with multiple complaints. Patient reported that she started having left-sided and substernal chest pressure that started in the afternoon while she was ambulating with a walker. Chest pressure went away after she stopped doing her activity however came back shortly after. Patient also reported 2 more episodes of chest pressure of shorter duration a day prior to admission and on the morning of 03/12/2013. However patient didn't pay enough attention to those episodes. The index episode that trigger her call was associated with shortness of breath and some nausea bolus generalized weakness. I was actually the one who took the call and recommended to go to the emergency room. Patient reported orthopnea for about one day however denied lower extremity edema. Patient presented with the above complaints to [facility] where she received nitroglycerin sublingual and morphine she was also started on IV heparin drip. Patient reported improvement of her chest pain after that.  At the time of my interview she however reported some chest pressure again - this has improved with sublingual nitroglycerin. Nitro paste was placed afterwards. She also reported epigastric discomfort that was different from the chest pressure described above  On further questioning she reported frequency with urination and burning with urination for about 3-4 weeks. Reported that she feels that she may be having a urinary tract infection. Patient also reported diarrhea x3 with significant amount of liquid stool on the day prior to presentation.    Patient denied active  symptoms of lower extremity edema, frequent or prolonged palpitations, chills or fevers, bleeding, dizziness or lightheadedness, syncope or presyncope  Review of Systems:  12 systems were reviewed and were negative except mentioned in the history of present illness  Past Medical History: Past Medical History  Diagnosis Date  . Hypertension     Unspecified  . Hyperlipidemia     Mixed, statin intolerance.  [name]. CAD (coronary artery disease) 08/21/09    a. CAD s/p CABG 2003. b. Cath 2006: occluded OM vein graft. c. Anterior MI 04/15/12 s/p PTCA only to ramus (stenting unsuccessful at that time) d. NSTEMI 06/2012: failed med rx, s/p DES to ramus c/b vessel perforation tx with balloon/stenting. Plavix hyporesponsive so changed to Brilinta.  . S/P CABG (coronary artery bypass graft) 2003     2003  LIMA, LAD, SVG to the OM, SVG right coronary artery  . Thoracic ascending aortic aneurysm 2003    Ascending thoracic aneurysm repair with a graft at time of CABG  . Diabetes mellitus     Insulin dependent  . Gastroparesis   . Depression   . Allergy history, radiographic dye     Contrast dye allergy  . Previous back surgery   . Syncope 2008    Question?  2008  . Chronic systolic heart failure   . Ischemic cardiomyopathy 06/2011    a. Echo 04/19/12: EF 25-30%, mod LVH, diffuse HK, trivial AI  . Hypotension     October, 2013  . Hypothyroidism     Treated with low-dose Synthroid  . CKD (chronic kidney disease)   . Carotid artery disease     a. 80-99% prox RICA stenosis - with recent cardiac event 06/2012, favor waiting at least 1  month, optimally 3 months post PCI.  [name] Hypertriglyceridemia   . Statin intolerance   . Peripheral neuropathy   . Atrial fibrillation     a. Noted during 06/2012 hospitalization. Placed on amiodarone. Plan is for event monitor to assess for breakthrough, hold off anticoag unless additional AF seen.  [name] NSVT (nonsustained ventricular tachycardia)     a. Noted during 06/2012  hospitalization.   Past Surgical History  Procedure Laterality Date  . Coronary artery bypass graft  05/05/2001     LIMA, LAD, SVG to the OM, SVG right coronary artery  . Bladder tacking    . Salivary gland surgery    . Total abdominal hysterectomy    . Back surgery    . Breast surgery    . External ear surgery    . Cardiac catheterization  04/15/12, 07/05/12    60-70% proximal LAD, 95% mid AV groove circumflex s/p PTCA, proximal RCA occlusion, patent LIMA to LAD, patent SVG to distal RCA, chronically occluded SVG to circumflex; LVEF 25-30%  . Ercp N/A 11/04/2012    Procedure: ENDOSCOPIC RETROGRADE CHOLANGIOPANCREATOGRAPHY (ERCP);  Surgeon: Petra Kuba, MD;  Location: [facility] OR;  Service: Endoscopy;  Laterality: N/A;    Medications: Prior to Admission medications   Medication Sig Start Date End Date Taking? Authorizing Provider  acetaminophen (TYLENOL) 500 MG tablet Take 1,000 mg by mouth every 6 (six) hours as needed for pain.    Yes Historical Provider, MD  amiodarone (PACERONE) 200 MG tablet Take 1 tablet (200 mg total) by mouth daily. 03/07/13  Yes Luis Abed, MD  aspirin EC 81 MG tablet Take 81 mg by mouth every morning. 04/28/12  Yes Clide Deutscher Serpe, PA-C  carvedilol (COREG) 25 MG tablet Take 1 tablet (25 mg total) by mouth 2 (two) times daily. 01/06/13  Yes Luis Abed, MD  cetirizine (ZYRTEC) 10 MG tablet Take 10 mg by mouth daily.   Yes Historical Provider, MD  fluticasone (FLONASE) 50 MCG/ACT nasal spray Place 1 spray into the nose daily as needed for allergies.   Yes Historical Provider, MD  folic acid (FOLVITE) 400 MCG tablet Take 400 mcg by mouth daily.   Yes Historical Provider, MD  furosemide (LASIX) 40 MG tablet Take 1 tablet (40 mg total) by mouth daily. 10/22/12  Yes Salley Scarlet, MD  insulin glargine (LANTUS) 100 UNIT/ML injection Inject 60 Units into the skin at bedtime. 11/10/12  Yes Rhonda G Barrett, PA-C  insulin lispro (HUMALOG) 100 UNIT/ML injection Inject 11-14  Units into the skin 3 (three) times daily before meals. Sliding scale as you were doing before admission to the hospital. 11/10/12  Yes Rhonda G Barrett, PA-C  isosorbide mononitrate (IMDUR) 30 MG 24 hr tablet Take 1 tablet (30 mg total) by mouth every morning. 01/24/13  Yes Luis Abed, MD  levothyroxine (SYNTHROID, LEVOTHROID) 25 MCG tablet Take 25 mcg by mouth daily before breakfast.   Yes Historical Provider, MD  meclizine (ANTIVERT) 25 MG tablet Take 1 tablet (25 mg total) by mouth 2 (two) times daily. 02/25/13  Yes Salley Scarlet, MD  promethazine (PHENERGAN) 25 MG tablet Take 1 tablet (25 mg total) by mouth every 6 (six) hours as needed for nausea. 01/25/13  Yes Salley Scarlet, MD  ramipril (ALTACE) 2.5 MG capsule Take 1 capsule (2.5 mg total) by mouth daily. 02/28/13  Yes Luis Abed, MD  tamsulosin (FLOMAX) 0.4 MG CAPS capsule Take 1 capsule (0.4 mg total) by mouth daily.  01/28/13  Yes Salley Scarlet, MD  nitroGLYCERIN (NITROSTAT) 0.4 MG SL tablet Place 0.4 mg under the tongue every 5 (five) minutes as needed for chest pain. May repeat times three.    Historical Provider, MD    Allergies:   Allergies  Allergen Reactions  . Brilinta [Ticagrelor] Shortness Of Breath and Nausea And Vomiting  . Iohexol Anaphylaxis       . Atorvastatin Other (See Comments)    Unknown-patient admitted to hospital after taking  . Citrus Dermatitis    BREAK OUT ON BODY  . Contrast Media [Iodinated Diagnostic Agents] Other (See Comments)    Passed out  . Plavix [Clopidogrel Bisulfate]     Not an allergy - but PRU was drawn 06/2012 indicating Plavix hyporesponsiveness, thus she was changed to Brilinta.  . Zolpidem Tartrate Other (See Comments)    Hallucinations  . Milk-Related Compounds Rash    Social History:  reports that she has never smoked. She has never used smokeless tobacco. She reports that she drinks about 1.5 ounces of alcohol per week. She reports that she does not use illicit  drugs.   Family History: Family History  Problem Relation Age of Onset  . Heart disease Other   . Heart disease Mother   . Hypertension Mother   . Heart disease Father   . Hypertension Father     PHYSICAL EXAM:  Filed Vitals:   03/12/13 2030 03/12/13 2211 03/12/13 2215 03/12/13 2325  BP: 117/53  119/52 124/58  Pulse: 65  68 70  Temp:   98.8 F (37.1 C)   TempSrc:   Oral   Resp: 24  15   Height:  5\' 5"  (1.651 m)    Weight:  90.22 kg (198 lb 14.4 oz)    SpO2: 93%  95%    General:  Well appearing. No respiratory difficulty HEENT: normal Neck: supple. JVD about 14 cm. Carotids 2+ bilat; right 2/6 bruit. No lymphadenopathy or thryomegaly appreciated. Cor: PMI nondisplaced. Regular rate & rhythm. Soft systolic murmur best heard over the second intercostal space  Lungs: Bibasilar crackles Abdomen: soft, mild epigastric tenderness, nondistended. No hepatosplenomegaly. No bruits or masses. Good bowel sounds. Extremities: no cyanosis, clubbing, rash, edema Neuro: alert & oriented x 3, cranial nerves grossly intact. moves all 4 extremities w/o difficulty. Affect pleasant.  Labs on Admission:   Recent Labs  03/12/13 1830  NA 142  K 4.2  CL 102  CO2 27  GLUCOSE 137*  BUN 17  CREATININE 0.87  CALCIUM 9.5    Recent Labs  03/12/13 1830  AST 15  ALT 9  ALKPHOS 115  BILITOT 0.2*  PROT 7.5  ALBUMIN 3.2*   No results found for this basename: LIPASE, AMYLASE,  in the last 72 hours  Recent Labs  03/12/13 1830  WBC 9.2  NEUTROABS 4.1  HGB 13.9  HCT 42.8  MCV 91.5  PLT 231    Recent Labs  03/12/13 1830  TROPONINI <0.30   No results found for this basename: TSH, T4TOTAL, FREET3, T3FREE, THYROIDAB,  in the last 72 hours No results found for this basename: VITAMINB12, FOLATE, FERRITIN, TIBC, IRON, RETICCTPCT,  in the last 72 hours  Radiological Exams on Admission (all images were personally reviewed and interpreted by me, radiology reports were reviewed as  well): Dg Chest Portable 1 View  03/12/2013   CLINICAL DATA:  Chest pain, coronary artery disease post CABG, diabetes, atrial fibrillation, hypertension, diabetes  EXAM: PORTABLE CHEST - 1 VIEW  COMPARISON:  Portable exam 1852 hr compared to 10/31/2012  FINDINGS: Normal heart size post CABG.  Atherosclerotic calcification aorta.  Mediastinal contours and pulmonary vascularity normal.  No acute infiltrate, pleural effusion or pneumothorax.  Bones demineralized.  Bilateral glenohumeral degenerative changes.  IMPRESSION: Post CABG.  No acute abnormalities.   Electronically Signed   By: Ulyses Southward M.D.   On: 03/12/2013 19:23    ECHO 04/19/2012 :  - Left ventricle: The cavity size was mildly dilated. Wall thickness was increased in a pattern of moderate LVH. Systolic function was moderately to severely reduced. The estimated ejection fraction was in the range of 30% to 35%. Diffuse hypokinesis. There was an increased relative contribution of atrial contraction to ventricular filling. - Aortic valve: Trivial regurgitation.   EKG personally reviewed and interpreted by me:  03/12/2013 2330: Sinus rhythm 69 beats per minute, left axis deviation, LVH with QRS widening - not significantly different compared to previous EKGs   Assessment/Plan Chest pain/Unstable angina Concern for unstable coronary syndrome given clinical picture as well as known coronary artery disease Aspirin 81 mg daily, heparin drip Trend troponins Continue home dose Coreg Nitro paste  CHF exacerbation Orthopnea, bibasilar crackles, mildly elevated jugular venous pressure Continue chronic outpatient medications including by mouth Lasix however will give additional Lasix 20 mg IV  Urinary tract infection Analysis and urine culture were obtained at any time emergency department Ceftriaxone 1 g every 24 hours IV  Diabetes mellitus insulin-dependent Continue home dose Lantus and NovoLog with additional sliding scale A1c check to  assess baseline diabetes control  Hyperlipidemia Patient reported history of statin intolerance, as listed allergy to Lipitor. However he is to take any statin medications. An extensive history of vascular disease will start patient on ezetimibe 10 mg daily Check TSH  Hypothyroidism for/chronic use of amiodarone Check TSH   DVT prophylaxis heparin full dose   GI prophylaxis PPI  Beuna Bolding 03/13/2013, 1:22 AM

## 2013-03-13 NOTE — Progress Notes (Signed)
Utilization Review Completed.   Harrington Jobe, RN, BSN Nurse Case Manager  

## 2013-03-13 NOTE — Progress Notes (Signed)
As above, patient presented with exertional chest pain relieved with rest which is new over the past 3 weeks. Similar to her previous infarct pain. Enzymes negative. Plan to proceed with cardiac catheterization tomorrow. The risks and benefits were discussed and the patient agrees to proceed. Olga MillersBrian Crenshaw

## 2013-03-14 ENCOUNTER — Encounter (HOSPITAL_COMMUNITY)
Admission: EM | Disposition: A | Discharge status: Home / Self Care | Payer: Self-pay | Source: Home / Self Care | Attending: Cardiology

## 2013-03-14 DIAGNOSIS — I701 Atherosclerosis of renal artery: Secondary | ICD-10-CM

## 2013-03-14 DIAGNOSIS — I2510 Atherosclerotic heart disease of native coronary artery without angina pectoris: Secondary | ICD-10-CM

## 2013-03-14 DIAGNOSIS — E781 Pure hyperglyceridemia: Secondary | ICD-10-CM | POA: Diagnosis present

## 2013-03-14 DIAGNOSIS — I200 Unstable angina: Secondary | ICD-10-CM

## 2013-03-14 HISTORY — PX: LEFT HEART CATHETERIZATION WITH CORONARY/GRAFT ANGIOGRAM: SHX5450

## 2013-03-14 LAB — GLUCOSE, CAPILLARY
GLUCOSE-CAPILLARY: 340 mg/dL — AB (ref 70–99)
GLUCOSE-CAPILLARY: 268 mg/dL — AB (ref 70–99)
GLUCOSE-CAPILLARY: 377 mg/dL — AB (ref 70–99)
Glucose-Capillary: 203 mg/dL — ABNORMAL HIGH (ref 70–99)

## 2013-03-14 LAB — CBC
HCT: 42.4 % (ref 36.0–46.0)
HEMOGLOBIN: 13.9 g/dL (ref 12.0–15.0)
MCH: 29.8 pg (ref 26.0–34.0)
MCHC: 32.8 g/dL (ref 30.0–36.0)
MCV: 90.8 fL (ref 78.0–100.0)
Platelets: 205 10*3/uL (ref 150–400)
RBC: 4.67 MIL/uL (ref 3.87–5.11)
RDW: 14.8 % (ref 11.5–15.5)
WBC: 8.2 10*3/uL (ref 4.0–10.5)

## 2013-03-14 LAB — POCT ACTIVATED CLOTTING TIME: Activated Clotting Time: 133 seconds

## 2013-03-14 LAB — HEPARIN LEVEL (UNFRACTIONATED): Heparin Unfractionated: 0.37 IU/mL (ref 0.30–0.70)

## 2013-03-14 SURGERY — LEFT HEART CATHETERIZATION WITH CORONARY/GRAFT ANGIOGRAM
Anesthesia: LOCAL

## 2013-03-14 MED ORDER — HYDRALAZINE HCL 20 MG/ML IJ SOLN
INTRAMUSCULAR | Status: AC
  Filled 2013-03-14: qty 1

## 2013-03-14 MED ORDER — SODIUM CHLORIDE 0.9 % IV SOLN: 1.0000 mL/kg/h | INTRAVENOUS | Status: AC

## 2013-03-14 MED ORDER — FAMOTIDINE IN NACL 20-0.9 MG/50ML-% IV SOLN
20.0000 mg | Freq: Once | INTRAVENOUS | Status: DC
  Filled 2013-03-14: qty 50

## 2013-03-14 MED ORDER — MIDAZOLAM HCL 2 MG/2ML IJ SOLN
INTRAMUSCULAR | Status: AC
  Filled 2013-03-14: qty 2

## 2013-03-14 MED ORDER — NITROGLYCERIN 0.2 MG/ML ON CALL CATH LAB
INTRAVENOUS | Status: AC
Start: 2013-03-14 — End: 2013-03-14
  Filled 2013-03-14: qty 1

## 2013-03-14 MED ORDER — HEPARIN (PORCINE) IN NACL 2-0.9 UNIT/ML-% IJ SOLN
INTRAMUSCULAR | Status: AC
  Filled 2013-03-14: qty 1000

## 2013-03-14 MED ORDER — ASPIRIN 81 MG PO CHEW: 81.0000 mg | CHEWABLE_TABLET | Freq: Every day | ORAL | Status: DC

## 2013-03-14 MED ORDER — FENTANYL CITRATE 0.05 MG/ML IJ SOLN
INTRAMUSCULAR | Status: AC
  Filled 2013-03-14: qty 2

## 2013-03-14 MED ORDER — LIDOCAINE HCL (PF) 1 % IJ SOLN
INTRAMUSCULAR | Status: AC
  Filled 2013-03-14: qty 30

## 2013-03-14 MED ORDER — PREDNISONE 50 MG PO TABS
60.0000 mg | ORAL_TABLET | Freq: Once | ORAL | Status: AC
  Administered 2013-03-14: 60 mg via ORAL
  Filled 2013-03-14: qty 1

## 2013-03-14 MED ORDER — HYDRALAZINE HCL 20 MG/ML IJ SOLN: 10.0000 mg | INTRAMUSCULAR | Status: DC | PRN

## 2013-03-14 NOTE — Progress Notes (Signed)
SUBJECTIVE:  She had not had further chest pain.  No SOB.  She is very weak.     PHYSICAL EXAM Filed Vitals:   03/13/13 1922 03/13/13 2041 03/14/13 0515 03/14/13 0834  BP:  139/41 166/66 172/63  Pulse:  62 71 68  Temp:  98.6 F (37 C) 98 F (36.7 C)   TempSrc:  Oral Oral   Resp:  16 16   Height:      Weight: 198 lb 3.1 oz (89.9 kg)     SpO2:  97% 96%    General:  No distress Lungs:  Clear Heart:  RRR Abdomen:  Positive bowel sounds, no rebound no guarding Extremities:  No edema  LABS: Lab Results  Component Value Date   TROPONINI <0.30 03/13/2013   Results for orders placed during the hospital encounter of 03/12/13 (from the past 24 hour(s))  GLUCOSE, CAPILLARY     Status: Abnormal   Collection Time    03/13/13 11:37 AM      Result Value Ref Range   Glucose-Capillary 151 (*) 70 - 99 mg/dL  TROPONIN I     Status: None   Collection Time    03/13/13 12:50 PM      Result Value Ref Range   Troponin I <0.30  <0.30 ng/mL  GLUCOSE, CAPILLARY     Status: Abnormal   Collection Time    03/13/13  4:27 PM      Result Value Ref Range   Glucose-Capillary 231 (*) 70 - 99 mg/dL  HEPARIN LEVEL (UNFRACTIONATED)     Status: Abnormal   Collection Time    03/13/13  5:15 PM      Result Value Ref Range   Heparin Unfractionated 0.11 (*) 0.30 - 0.70 IU/mL  GLUCOSE, CAPILLARY     Status: Abnormal   Collection Time    03/13/13  8:45 PM      Result Value Ref Range   Glucose-Capillary 183 (*) 70 - 99 mg/dL  HEPARIN LEVEL (UNFRACTIONATED)     Status: None   Collection Time    03/14/13  5:05 AM      Result Value Ref Range   Heparin Unfractionated 0.37  0.30 - 0.70 IU/mL  CBC     Status: None   Collection Time    03/14/13  5:05 AM      Result Value Ref Range   WBC 8.2  4.0 - 10.5 K/uL   RBC 4.67  3.87 - 5.11 MIL/uL   Hemoglobin 13.9  12.0 - 15.0 g/dL   HCT 96.242.4  95.236.0 - 84.146.0 %   MCV 90.8  78.0 - 100.0 fL   MCH 29.8  26.0 - 34.0 pg   MCHC 32.8  30.0 - 36.0 g/dL   RDW 32.414.8  40.111.5  - 02.715.5 %   Platelets 205  150 - 400 K/uL  GLUCOSE, CAPILLARY     Status: Abnormal   Collection Time    03/14/13  7:32 AM      Result Value Ref Range   Glucose-Capillary 340 (*) 70 - 99 mg/dL   Comment 1 Notify RN     Comment 2 Documented in Chart      Intake/Output Summary (Last 24 hours) at 03/14/13 0950 Last data filed at 03/14/13 0912  Gross per 24 hour  Intake 801.25 ml  Output   1400 ml  Net -598.75 ml    ASSESSMENT AND PLAN:  CHEST PAIN:  Cath today.  Enzymes negative.  Contrast allergy.  She has had prophylaxis.   The patient understands that risks included but are not limited to stroke (1 in 1000), death (1 in 1000), kidney failure [usually temporary] (1 in 500), bleeding (1 in 200), allergic reaction [possibly serious] (1 in 200).  The patient understands and agrees to proceed.    UTI:  UA positive.  On antibiotics day 2.  Culture pending.    DM:  Continue current therapy.  A1c is 8.6.  She will need continue follow up with Milinda Antis, MD   Rollene Rotunda 03/14/2013 9:50 AM

## 2013-03-14 NOTE — CV Procedure (Signed)
    PROCEDURE:  Left heart catheterization with selective coronary angiography, left ventriculogram. Bypass graft angio. Abdominal aortogram.  INDICATIONS:  Unstable angina  The risks, benefits, and details of the procedure were explained to the patient.  The patient verbalized understanding and wanted to proceed.  Informed written consent was obtained.  PROCEDURE TECHNIQUE:  After Xylocaine anesthesia a 24F sheath was placed in the right femoral artery with a single anterior needle wall stick.   Left coronary angiography was done using a Judkins L4 guide catheter.  Right coronary angiography was done using a Judkins R4 guide catheter.  Left ventriculography was done using a pigtail catheter.    CONTRAST:  Total of 125 cc.  COMPLICATIONS:  None.    HEMODYNAMICS:  Aortic pressure was 154/55; LV pressure was 159/13; LVEDP 24.  There was no gradient between the left ventricle and aorta.    ANGIOGRAPHIC DATA:   The left main coronary artery is widely patent.  The left anterior descending artery is moderately diseased in the midportion after a large septal perforator. The LAD passed a large diagonal is heavily diseased and there is competitive flow noted.  The diagonal vessel is patent with only mild irregularities.  The LIMA to LAD is widely patent. In the distal LAD, after the insertion of the LIMA, there is moderate to severe diffuse disease. The disease extends into the apical LAD after it wraps around the apex.  The left circumflex artery is a large vessel. The stent in the large OM1 vessel is widely patent, with only minimal restenosis. There are collaterals to the true circumflex. The mid circumflex is occluded.  The SVG to OM is occluded.  The right coronary artery is occluded proximally.   The SVG to PDA appears widely patent. There is mild, proximal disease. There are right-to-left collaterals feeding the circumflex territory as well.    LEFT VENTRICULOGRAM:  Left ventricular  angiogram was done in the 30 RAO projection and revealed  globally decreased  left ventricular wall motion  with mildly decreased  systolic function with an estimated ejection fraction of 40-45%.  LVEDP was 24  MmHg.  ABDOMINAL AORTOGRAM: There is mild aortic atherosclerosis noted in the infrarenal aorta. There are bilateral single renal arteries, both of which appear widely patent. The aortoiliac bifurcation appears patent.   IMPRESSIONS:  1.  Patent  left main coronary artery. 2.  Moderately diseased mid  left anterior descending artery.  Patent first diagonal.  Patent LIMA to LAD. Diffuse disease in the distal/apical native LAD, beyond the insertion of the LIMA. 3.  Widely patent stent in the large first obtuse marginal. Occluded mid  left circumflex artery  with both left to left and right-to-left collaterals filling the distal circumflex system. 4.  Occluded proximal  right coronary artery.  Patent SVG to PDA  5.  Mildly decreased  left ventricular systolic function.  LVEDP 24  mmHg.  Ejection fraction 40-45%. 6.   No renal artery stenosis. Mild aortic atherosclerosis. Mild dilatation of the infrarenal aorta without severe focal aneurysm.  RECOMMENDATION:  No significant change from prior catheterization done in June 2014. Stent that was placed then is patent. Diffuse distal LAD is unchanged. This is not a good target for revascularization, percutaneous or with bypass.  Continue medical therapy.

## 2013-03-14 NOTE — H&P (View-Only) (Signed)
 SUBJECTIVE:  She had not had further chest pain.  No SOB.  She is very weak.     PHYSICAL EXAM Filed Vitals:   03/13/13 1922 03/13/13 2041 03/14/13 0515 03/14/13 0834  BP:  139/41 166/66 172/63  Pulse:  62 71 68  Temp:  98.6 F (37 C) 98 F (36.7 C)   TempSrc:  Oral Oral   Resp:  16 16   Height:      Weight: 198 lb 3.1 oz (89.9 kg)     SpO2:  97% 96%    General:  No distress Lungs:  Clear Heart:  RRR Abdomen:  Positive bowel sounds, no rebound no guarding Extremities:  No edema  LABS: Lab Results  Component Value Date   TROPONINI <0.30 03/13/2013   Results for orders placed during the hospital encounter of 03/12/13 (from the past 24 hour(s))  GLUCOSE, CAPILLARY     Status: Abnormal   Collection Time    03/13/13 11:37 AM      Result Value Ref Range   Glucose-Capillary 151 (*) 70 - 99 mg/dL  TROPONIN I     Status: None   Collection Time    03/13/13 12:50 PM      Result Value Ref Range   Troponin I <0.30  <0.30 ng/mL  GLUCOSE, CAPILLARY     Status: Abnormal   Collection Time    03/13/13  4:27 PM      Result Value Ref Range   Glucose-Capillary 231 (*) 70 - 99 mg/dL  HEPARIN LEVEL (UNFRACTIONATED)     Status: Abnormal   Collection Time    03/13/13  5:15 PM      Result Value Ref Range   Heparin Unfractionated 0.11 (*) 0.30 - 0.70 IU/mL  GLUCOSE, CAPILLARY     Status: Abnormal   Collection Time    03/13/13  8:45 PM      Result Value Ref Range   Glucose-Capillary 183 (*) 70 - 99 mg/dL  HEPARIN LEVEL (UNFRACTIONATED)     Status: None   Collection Time    03/14/13  5:05 AM      Result Value Ref Range   Heparin Unfractionated 0.37  0.30 - 0.70 IU/mL  CBC     Status: None   Collection Time    03/14/13  5:05 AM      Result Value Ref Range   WBC 8.2  4.0 - 10.5 K/uL   RBC 4.67  3.87 - 5.11 MIL/uL   Hemoglobin 13.9  12.0 - 15.0 g/dL   HCT 42.4  36.0 - 46.0 %   MCV 90.8  78.0 - 100.0 fL   MCH 29.8  26.0 - 34.0 pg   MCHC 32.8  30.0 - 36.0 g/dL   RDW 14.8  11.5  - 15.5 %   Platelets 205  150 - 400 K/uL  GLUCOSE, CAPILLARY     Status: Abnormal   Collection Time    03/14/13  7:32 AM      Result Value Ref Range   Glucose-Capillary 340 (*) 70 - 99 mg/dL   Comment 1 Notify RN     Comment 2 Documented in Chart      Intake/Output Summary (Last 24 hours) at 03/14/13 0950 Last data filed at 03/14/13 0912  Gross per 24 hour  Intake 801.25 ml  Output   1400 ml  Net -598.75 ml    ASSESSMENT AND PLAN:  CHEST PAIN:  Cath today.  Enzymes negative.  Contrast allergy.     She has had prophylaxis.   The patient understands that risks included but are not limited to stroke (1 in 1000), death (1 in 1000), kidney failure [usually temporary] (1 in 500), bleeding (1 in 200), allergic reaction [possibly serious] (1 in 200).  The patient understands and agrees to proceed.    UTI:  UA positive.  On antibiotics day 2.  Culture pending.    DM:  Continue current therapy.  A1c is 8.6.  She will need continue follow up with [place], KAWANTA, MD   Cathy [name] 03/14/2013 9:50 AM   

## 2013-03-14 NOTE — Progress Notes (Signed)
ANTICOAGULATION CONSULT NOTE - Follow Up Consult  Pharmacy Consult for Heparin  Indication: chest pain/ACS  Allergies  Allergen Reactions  . Brilinta [Ticagrelor] Shortness Of Breath and Nausea And Vomiting  . Iohexol Anaphylaxis       . Atorvastatin Other (See Comments)    Unknown-patient admitted to hospital after taking  . Citrus Dermatitis    BREAK OUT ON BODY  . Contrast Media [Iodinated Diagnostic Agents] Other (See Comments)    Passed out  . Plavix [Clopidogrel Bisulfate]     Not an allergy - but PRU was drawn 06/2012 indicating Plavix hyporesponsiveness, thus she was changed to Brilinta.  . Zolpidem Tartrate Other (See Comments)    Hallucinations  . Milk-Related Compounds Rash    Patient Measurements: Height: 5\' 5"  (165.1 cm) Weight: 198 lb 3.1 oz (89.9 kg) IBW/kg (Calculated) : 57  Vital Signs: Temp: 98 F (36.7 C) (02/23 0515) Temp src: Oral (02/23 0515) BP: 166/66 mmHg (02/23 0515) Pulse Rate: 71 (02/23 0515)  Labs:  Recent Labs  03/12/13 1830 03/13/13 0026 03/13/13 0500 03/13/13 0530 03/13/13 0550 03/13/13 1250 03/13/13 1715 03/14/13 0505  HGB 13.9  --  14.4  --   --   --   --  13.9  HCT 42.8  --  43.6  --   --   --   --  42.4  PLT 231  --  220  --   --   --   --  205  LABPROT 12.4  --   --   --  12.5  --   --   --   INR 0.94  --   --   --  0.95  --   --   --   HEPARINUNFRC  --   --   --   --  0.10*  --  0.11* 0.37  CREATININE 0.87  --   --   --  0.91  --   --   --   TROPONINI <0.30 <0.30  --  <0.30  --  <0.30  --   --     Estimated Creatinine Clearance: 54.6 ml/min (by C-G formula based on Cr of 0.91).   Assessment: 80 y/o on heparin for CP. HL 0.37 on 1350 units/hr. Other labs as above.   Goal of Therapy:  Heparin level 0.3-0.7 units/ml Monitor platelets by anticoagulation protocol: Yes   Plan:  -Continue heparin at 1350 units/hr -1300 HL to confirm, pending cath -Daily CBC/HL, pending cath -Monitor for bleeding  Abran DukeLedford,  Shanele Nissan 03/14/2013,6:41 AM

## 2013-03-14 NOTE — Interval H&P Note (Signed)
Cath Lab Visit (complete for each Cath Lab visit)  Clinical Evaluation Leading to the Procedure:   ACS: yes  Non-ACS:    Anginal Classification: CCS IV  Anti-ischemic medical therapy: Maximal Therapy (2 or more classes of medications)  Non-Invasive Test Results: No non-invasive testing performed  Prior CABG: Previous CABG      History and Physical Interval Note:  03/14/2013 3:35 PM  Cathy [name]  has presented today for surgery, with the diagnosis of cp  The various methods of treatment have been discussed with the patient and family. After consideration of risks, benefits and other options for treatment, the patient has consented to  Procedure(s): LEFT HEART CATHETERIZATION WITH CORONARY/GRAFT ANGIOGRAM (N/A) as a surgical intervention .  The patient's history has been reviewed, patient examined, no change in status, stable for surgery.  I have reviewed the patient's chart and labs.  Questions were answered to the patient's satisfaction.     Jalisa Sacco S.

## 2013-03-15 ENCOUNTER — Encounter (HOSPITAL_COMMUNITY): Payer: Self-pay | Admitting: Nurse Practitioner

## 2013-03-15 DIAGNOSIS — I2510 Atherosclerotic heart disease of native coronary artery without angina pectoris: Secondary | ICD-10-CM

## 2013-03-15 DIAGNOSIS — I4891 Unspecified atrial fibrillation: Secondary | ICD-10-CM

## 2013-03-15 DIAGNOSIS — R0789 Other chest pain: Secondary | ICD-10-CM

## 2013-03-15 LAB — CBC
HCT: 37.8 % (ref 36.0–46.0)
Hemoglobin: 12.5 g/dL (ref 12.0–15.0)
MCH: 30 pg (ref 26.0–34.0)
MCHC: 33.1 g/dL (ref 30.0–36.0)
MCV: 90.6 fL (ref 78.0–100.0)
PLATELETS: 205 10*3/uL (ref 150–400)
RBC: 4.17 MIL/uL (ref 3.87–5.11)
RDW: 15.2 % (ref 11.5–15.5)
WBC: 12.5 10*3/uL — AB (ref 4.0–10.5)

## 2013-03-15 LAB — URINE CULTURE: Colony Count: >=100000

## 2013-03-15 LAB — GLUCOSE, CAPILLARY
GLUCOSE-CAPILLARY: 313 mg/dL — AB (ref 70–99)
Glucose-Capillary: 228 mg/dL — ABNORMAL HIGH (ref 70–99)

## 2013-03-15 MED ORDER — EZETIMIBE 10 MG PO TABS: 10.0000 mg | ORAL_TABLET | Freq: Every day | ORAL | Status: DC

## 2013-03-15 NOTE — Discharge Summary (Signed)
Discharge Summary   Patient ID: Cathy [name],  MRN: [id number], DOB/AGE: 1930-04-05 80 y.o.  Admit date: 03/12/2013 Discharge date: 03/15/2013  Primary Care Provider: Milinda Antis Primary Cardiologist: Lovena Neighbours, MD   Discharge Diagnoses Principal Problem:   Midsternal chest pain   **S/p Catheterization this admission revealing stable coronary anatomy->Med Rx.  Active Problems:   DM (diabetes mellitus), type 2 with neurological complications   CKD (chronic kidney disease)   CAD (coronary artery disease)   Chronic systolic CHF (congestive heart failure)   Ischemic cardiomyopathy   UTI (urinary tract infection)   Hypertension   Hyperlipidemia   S/P CABG (coronary artery bypass graft)   Depression   Statin intolerance   Hypothyroidism   Carotid artery disease   Atrial fibrillation   Hypertriglyceridemia   Allergies Allergies  Allergen Reactions  . Brilinta [Ticagrelor] Shortness Of Breath and Nausea And Vomiting  . Iohexol Anaphylaxis       . Atorvastatin Other (See Comments)    Unknown-patient admitted to hospital after taking  . Citrus Dermatitis    BREAK OUT ON BODY  . Contrast Media [Iodinated Diagnostic Agents] Other (See Comments)    Passed out  . Plavix [Clopidogrel Bisulfate]     Not an allergy - but PRU was drawn 06/2012 indicating Plavix hyporesponsiveness, thus she was changed to Brilinta.  . Zolpidem Tartrate Other (See Comments)    Hallucinations  . Milk-Related Compounds Rash    Procedures  Cardiac Catheterization 2.23.2015  HEMODYNAMICS:  Aortic pressure was 154/55; LV pressure was 159/13; LVEDP 24.  There was no gradient between the left ventricle and aorta.    ANGIOGRAPHIC DATA:   The left main coronary artery is widely patent.  The left anterior descending artery is moderately diseased in the midportion after a large septal perforator. The LAD passed a large diagonal is heavily diseased and there is competitive flow noted.  The diagonal  vessel is patent with only mild irregularities. The LIMA to LAD is widely patent. In the distal LAD, after the insertion of the LIMA, there is moderate to severe diffuse disease. The disease extends into the apical LAD after it wraps around the apex. The left circumflex artery is a large vessel. The stent in the large OM1 vessel is widely patent, with only minimal restenosis. There are collaterals to the true circumflex. The mid circumflex is occluded. The SVG to OM is occluded. The right coronary artery is occluded proximally.  The SVG to PDA appears widely patent. There is mild, proximal disease. There are right-to-left collaterals feeding the circumflex territory as well.  LEFT VENTRICULOGRAM:  Left ventricular angiogram was done in the 30 RAO projection and revealed  globally decreased  left ventricular wall motion  with mildly decreased  systolic function with an estimated ejection fraction of 40-45%.  LVEDP was 24  MmHg.  ABDOMINAL AORTOGRAM: There is mild aortic atherosclerosis noted in the infrarenal aorta. There are bilateral single renal arteries, both of which appear widely patent. The aortoiliac bifurcation appears patent.   IMPRESSIONS:    1.  Patent  left main coronary artery. 2. Moderately diseased mid  left anterior descending artery.  Patent first diagonal.  Patent LIMA to LAD. Diffuse disease in the distal/apical native LAD, beyond the insertion of the LIMA. 3. Widely patent stent in the large first obtuse marginal. Occluded mid  left circumflex artery  with both left to left and right-to-left collaterals filling the distal circumflex system. 4. Occluded proximal  right coronary artery.  Patent SVG to PDA   5. Mildly decreased  left ventricular systolic function.  LVEDP 24  mmHg.  Ejection fraction 40-45%. 6.   No renal artery stenosis. Mild aortic atherosclerosis. Mild dilatation of the infrarenal aorta without severe focal aneurysm.  RECOMMENDATION:  No significant change  from prior catheterization done in June 2014. Stent that was placed then is patent. Diffuse distal LAD is unchanged. This is not a good target for revascularization, percutaneous or with bypass.  Continue medical therapy. _____________   History of Present Illness  80 year old female with prior history of coronary artery disease status post coronary artery bypass grafting. She has subsequently undergone stenting of the native obtuse marginal in the setting of occlusion of the vein graft to the obtuse marginal. She also has a history of diabetes, hypertension, hyperlipidemia, statin intolerance, and chronic kidney disease. She was in her usual state of health until the afternoon of February 21, when she began to experience intermittent substernal chest pressure. Later that evening, she had an episode associated with dyspnea, nausea, and generalized weakness. She presented to the [facility] where she was treated with nitroglycerin and morphine and placed on a heparin infusion. Chest pain resolved after these interventions but she did have recurrent discomfort while in the ED. She also reported dysuria and was concerned that she may have a urinary tract infection. She was admitted for further evaluation.  Hospital Course  Patient ruled out for myocardial infarction. She had no further chest discomfort. Decision was made to pursue diagnostic cardiac catheterization which was performed on February 23, revealing stable coronary anatomy when compared to her last catheterization with patency of the previously placed stent in the native obtuse marginal. The LIMA to the LAD and a vein graft to the PDA were widely patent. Continue medical therapy was recommended. Patient was also placed on Rocephin therapy for Escherichia coli UTI and has completed 3 doses. She has been ambulating without recurrent symptoms or limitations and will be discharged home today in good condition.  Discharge Vitals Blood pressure 158/47,  pulse 65, temperature 97.4 F (36.3 C), temperature source Oral, resp. rate 18, height 5\' 5"  (1.651 m), weight 201 lb 11.2 oz (91.491 kg), SpO2 92.00%.  Filed Weights   03/13/13 0500 03/13/13 1922 03/15/13 0631  Weight: 198 lb 3.2 oz (89.903 kg) 198 lb 3.1 oz (89.9 kg) 201 lb 11.2 oz (91.491 kg)    Labs  CBC  Recent Labs  03/12/13 1830  03/14/13 0505 03/15/13 0400  WBC 9.2  < > 8.2 12.5*  NEUTROABS 4.1  --   --   --   HGB 13.9  < > 13.9 12.5  HCT 42.8  < > 42.4 37.8  MCV 91.5  < > 90.8 90.6  PLT 231  < > 205 205  < > = values in this interval not displayed. Basic Metabolic Panel  Recent Labs  03/12/13 1830 03/13/13 0550  NA 142 141  K 4.2 4.1  CL 102 102  CO2 27 24  GLUCOSE 137* 173*  BUN 17 19  CREATININE 0.87 0.91  CALCIUM 9.5 9.3   Liver Function Tests  Recent Labs  03/12/13 1830  AST 15  ALT 9  ALKPHOS 115  BILITOT 0.2*  PROT 7.5  ALBUMIN 3.2*   Cardiac Enzymes  Recent Labs  03/13/13 0026 03/13/13 0530 03/13/13 1250  TROPONINI <0.30 <0.30 <0.30   Hemoglobin A1C  Recent Labs  03/13/13 0550  HGBA1C 8.6*   Fasting Lipid Panel  Recent Labs  03/13/13 0550  CHOL 194  HDL 33*  LDLCALC UNABLE TO CALCULATE IF TRIGLYCERIDE OVER 400 mg/dL  TRIG 409*  CHOLHDL 5.9   Thyroid Function Tests  Recent Labs  03/13/13 0550  TSH 1.687   Disposition  Pt is being discharged home today in good condition.  Follow-up Plans & Appointments      Follow-up Information   Follow up with Nicolasa Ducking, NP On 03/21/2013. (3:00 PM - Dr. Henrietta Hoover Nurse Practitioner.)    Specialty:  Nurse Practitioner   Contact information:   [address] [place] [address] [phone]      Discharge Medications    Medication List         acetaminophen 500 MG tablet  Commonly known as:  TYLENOL  Take 1,000 mg by mouth every 6 (six) hours as needed for pain.     amiodarone 200 MG tablet  Commonly known as:  PACERONE  Take 1 tablet  (200 mg total) by mouth daily.     aspirin EC 81 MG tablet  Take 81 mg by mouth every morning.     carvedilol 25 MG tablet  Commonly known as:  COREG  Take 1 tablet (25 mg total) by mouth 2 (two) times daily.     cetirizine 10 MG tablet  Commonly known as:  ZYRTEC  Take 10 mg by mouth daily.     ezetimibe 10 MG tablet  Commonly known as:  ZETIA  Take 1 tablet (10 mg total) by mouth daily.     fluticasone 50 MCG/ACT nasal spray  Commonly known as:  FLONASE  Place 1 spray into the nose daily as needed for allergies.     folic acid 400 MCG tablet  Commonly known as:  FOLVITE  Take 400 mcg by mouth daily.     furosemide 40 MG tablet  Commonly known as:  LASIX  Take 1 tablet (40 mg total) by mouth daily.     insulin glargine 100 UNIT/ML injection  Commonly known as:  LANTUS  Inject 60 Units into the skin at bedtime.     insulin lispro 100 UNIT/ML injection  Commonly known as:  HUMALOG  Inject 11-14 Units into the skin 3 (three) times daily before meals. Sliding scale as you were doing before admission to the hospital.     isosorbide mononitrate 30 MG 24 hr tablet  Commonly known as:  IMDUR  Take 1 tablet (30 mg total) by mouth every morning.     levothyroxine 25 MCG tablet  Commonly known as:  SYNTHROID, LEVOTHROID  Take 25 mcg by mouth daily before breakfast.     meclizine 25 MG tablet  Commonly known as:  ANTIVERT  Take 1 tablet (25 mg total) by mouth 2 (two) times daily.     NITROSTAT 0.4 MG SL tablet  Generic drug:  nitroGLYCERIN  Place 0.4 mg under the tongue every 5 (five) minutes as needed for chest pain. May repeat times three.     promethazine 25 MG tablet  Commonly known as:  PHENERGAN  Take 1 tablet (25 mg total) by mouth every 6 (six) hours as needed for nausea.     ramipril 2.5 MG capsule  Commonly known as:  ALTACE  Take 1 capsule (2.5 mg total) by mouth daily.     tamsulosin 0.4 MG Caps capsule  Commonly known as:  FLOMAX  Take 1 capsule (0.4  mg total) by mouth daily.  Outstanding Labs/Studies  None  Duration of Discharge Encounter   Greater than 30 minutes including physician time.  Signed, Nicolasa Duckinghristopher Elaine Middleton NP 03/15/2013, 11:04 AM

## 2013-03-15 NOTE — Progress Notes (Addendum)
Cath shows stable anatomy. Plan discharge today. No med changes. BS elevated due to pre-procedure steroids. Right groin okay.

## 2013-03-15 NOTE — Discharge Instructions (Signed)

## 2013-03-16 NOTE — Discharge Summary (Signed)
As per the above note. Agree with assessment and plans for discharge.

## 2013-03-21 ENCOUNTER — Telehealth: Payer: Self-pay | Admitting: Family Medicine

## 2013-03-21 ENCOUNTER — Encounter: Payer: Commercial Managed Care - HMO | Admitting: Nurse Practitioner

## 2013-03-21 MED ORDER — MECLIZINE HCL 25 MG PO TABS: 25.0000 mg | ORAL_TABLET | Freq: Two times a day (BID) | ORAL | Status: DC

## 2013-03-21 MED ORDER — RAMIPRIL 2.5 MG PO CAPS: 2.5000 mg | ORAL_CAPSULE | Freq: Every day | ORAL | Status: DC

## 2013-03-21 MED ORDER — NITROGLYCERIN 0.4 MG SL SUBL: 0.4000 mg | SUBLINGUAL_TABLET | SUBLINGUAL | Status: AC | PRN

## 2013-03-21 MED ORDER — LEVOTHYROXINE SODIUM 25 MCG PO TABS: 25.0000 ug | ORAL_TABLET | Freq: Every day | ORAL | Status: DC

## 2013-03-21 MED ORDER — EZETIMIBE 10 MG PO TABS: 10.0000 mg | ORAL_TABLET | Freq: Every day | ORAL | Status: DC

## 2013-03-21 MED ORDER — INSULIN LISPRO 100 UNIT/ML ~~LOC~~ SOLN: 11.0000 [IU] | Freq: Three times a day (TID) | SUBCUTANEOUS | Status: DC

## 2013-03-21 MED ORDER — CARVEDILOL 25 MG PO TABS: 25.0000 mg | ORAL_TABLET | Freq: Two times a day (BID) | ORAL | Status: DC

## 2013-03-21 MED ORDER — FLUTICASONE PROPIONATE 50 MCG/ACT NA SUSP: 1.0000 | Freq: Every day | NASAL | Status: DC | PRN

## 2013-03-21 MED ORDER — ISOSORBIDE MONONITRATE ER 30 MG PO TB24: 30.0000 mg | ORAL_TABLET | Freq: Every morning | ORAL | Status: DC

## 2013-03-21 MED ORDER — FUROSEMIDE 40 MG PO TABS: 40.0000 mg | ORAL_TABLET | Freq: Every day | ORAL | Status: DC

## 2013-03-21 MED ORDER — AMIODARONE HCL 200 MG PO TABS: 200.0000 mg | ORAL_TABLET | Freq: Every day | ORAL | Status: DC

## 2013-03-21 MED ORDER — TAMSULOSIN HCL 0.4 MG PO CAPS: 0.4000 mg | ORAL_CAPSULE | Freq: Every day | ORAL | Status: DC

## 2013-03-21 MED ORDER — INSULIN GLARGINE 100 UNIT/ML ~~LOC~~ SOLN: 60.0000 [IU] | Freq: Every day | SUBCUTANEOUS | Status: DC

## 2013-03-21 MED ORDER — LEVOTHYROXINE SODIUM 25 MCG PO TABS: 25.0000 ug | ORAL_TABLET | Freq: Every day | ORAL | Status: AC

## 2013-03-21 MED ORDER — PROMETHAZINE HCL 25 MG PO TABS: 25.0000 mg | ORAL_TABLET | Freq: Four times a day (QID) | ORAL | Status: DC | PRN

## 2013-03-21 NOTE — Telephone Encounter (Signed)
Refill appropriate and filled per protocol.  Patient aware.

## 2013-03-21 NOTE — Telephone Encounter (Signed)
Call back number is [phone] Pt is needing a refill on all of her medications [facility]

## 2013-03-22 ENCOUNTER — Other Ambulatory Visit: Payer: Self-pay | Admitting: *Deleted

## 2013-03-22 MED ORDER — INSULIN GLARGINE 100 UNIT/ML ~~LOC~~ SOLN: 60.0000 [IU] | Freq: Every day | SUBCUTANEOUS | Status: DC

## 2013-03-22 NOTE — Telephone Encounter (Signed)
Received fax from [facility] requesting clarification on Lantus refill. New refill sent to pharmacy.

## 2013-03-29 ENCOUNTER — Other Ambulatory Visit: Payer: Self-pay | Admitting: *Deleted

## 2013-03-29 MED ORDER — TAMSULOSIN HCL 0.4 MG PO CAPS: 0.4000 mg | ORAL_CAPSULE | Freq: Every day | ORAL | Status: DC

## 2013-03-29 NOTE — Telephone Encounter (Signed)
Refill appropriate and filled per protocol. 

## 2013-03-31 ENCOUNTER — Telehealth: Payer: Self-pay | Admitting: *Deleted

## 2013-03-31 NOTE — Telephone Encounter (Signed)
[facility]. Repotted that patient received HH services for SN, PT and HH aide for the dates 11/16/2012- 01/02/2013 following hospital stay.   Requested to have MD sign certification for services.   Advised that patient was not seen after hospital stay until 12/29/2012.  No earlier visit noted in EPIC.   [place] [facility].   Advised that no PA here named Bjorn Loserhonda.

## 2013-04-11 ENCOUNTER — Other Ambulatory Visit: Payer: Self-pay | Admitting: *Deleted

## 2013-04-11 MED ORDER — PROMETHAZINE HCL 25 MG PO TABS: 25.0000 mg | ORAL_TABLET | Freq: Four times a day (QID) | ORAL | Status: DC | PRN

## 2013-04-11 NOTE — Telephone Encounter (Signed)
Received letter from patient insurance.   States that Promethazine 25mg  #30 will no longer be covered bu insurance.   MD made aware and new orders obtained to change quantity to #20.   Prescription sent to pharmacy.

## 2013-04-17 ENCOUNTER — Emergency Department (HOSPITAL_COMMUNITY)
Admission: AD | Admit: 2013-04-17 | Discharge: 2013-04-18 | Disposition: A | Discharge status: Home / Self Care | Payer: Medicare HMO | Attending: Emergency Medicine | Admitting: Emergency Medicine

## 2013-04-17 ENCOUNTER — Encounter (HOSPITAL_COMMUNITY): Payer: Self-pay | Admitting: Emergency Medicine

## 2013-04-17 DIAGNOSIS — Z8719 Personal history of other diseases of the digestive system: Secondary | ICD-10-CM | POA: Insufficient documentation

## 2013-04-17 DIAGNOSIS — R112 Nausea with vomiting, unspecified: Secondary | ICD-10-CM | POA: Insufficient documentation

## 2013-04-17 DIAGNOSIS — E119 Type 2 diabetes mellitus without complications: Secondary | ICD-10-CM | POA: Insufficient documentation

## 2013-04-17 DIAGNOSIS — E039 Hypothyroidism, unspecified: Secondary | ICD-10-CM | POA: Insufficient documentation

## 2013-04-17 DIAGNOSIS — Z9889 Other specified postprocedural states: Secondary | ICD-10-CM | POA: Insufficient documentation

## 2013-04-17 DIAGNOSIS — R55 Syncope and collapse: Secondary | ICD-10-CM

## 2013-04-17 DIAGNOSIS — I129 Hypertensive chronic kidney disease with stage 1 through stage 4 chronic kidney disease, or unspecified chronic kidney disease: Secondary | ICD-10-CM | POA: Insufficient documentation

## 2013-04-17 DIAGNOSIS — I5022 Chronic systolic (congestive) heart failure: Secondary | ICD-10-CM | POA: Insufficient documentation

## 2013-04-17 DIAGNOSIS — Z794 Long term (current) use of insulin: Secondary | ICD-10-CM | POA: Insufficient documentation

## 2013-04-17 DIAGNOSIS — Z7982 Long term (current) use of aspirin: Secondary | ICD-10-CM | POA: Insufficient documentation

## 2013-04-17 DIAGNOSIS — Z79899 Other long term (current) drug therapy: Secondary | ICD-10-CM | POA: Insufficient documentation

## 2013-04-17 DIAGNOSIS — I2510 Atherosclerotic heart disease of native coronary artery without angina pectoris: Secondary | ICD-10-CM | POA: Insufficient documentation

## 2013-04-17 DIAGNOSIS — E785 Hyperlipidemia, unspecified: Secondary | ICD-10-CM | POA: Insufficient documentation

## 2013-04-17 DIAGNOSIS — Z951 Presence of aortocoronary bypass graft: Secondary | ICD-10-CM | POA: Insufficient documentation

## 2013-04-17 DIAGNOSIS — Z8669 Personal history of other diseases of the nervous system and sense organs: Secondary | ICD-10-CM | POA: Insufficient documentation

## 2013-04-17 DIAGNOSIS — N189 Chronic kidney disease, unspecified: Secondary | ICD-10-CM | POA: Insufficient documentation

## 2013-04-17 LAB — URINALYSIS, ROUTINE W REFLEX MICROSCOPIC
Bilirubin Urine: NEGATIVE
GLUCOSE, UA: NEGATIVE mg/dL
HGB URINE DIPSTICK: NEGATIVE
Ketones, ur: NEGATIVE mg/dL
Nitrite: NEGATIVE
PROTEIN: NEGATIVE mg/dL
SPECIFIC GRAVITY, URINE: 1.015 (ref 1.005–1.030)
Urobilinogen, UA: 0.2 mg/dL (ref 0.0–1.0)
pH: 5.5 (ref 5.0–8.0)

## 2013-04-17 LAB — CBC WITH DIFFERENTIAL/PLATELET
Basophils Absolute: 0 10*3/uL (ref 0.0–0.1)
Basophils Relative: 0 % (ref 0–1)
Eosinophils Absolute: 0.1 10*3/uL (ref 0.0–0.7)
Eosinophils Relative: 1 % (ref 0–5)
HCT: 44.1 % (ref 36.0–46.0)
HEMOGLOBIN: 14.3 g/dL (ref 12.0–15.0)
LYMPHS ABS: 3 10*3/uL (ref 0.7–4.0)
LYMPHS PCT: 27 % (ref 12–46)
MCH: 29.8 pg (ref 26.0–34.0)
MCHC: 32.4 g/dL (ref 30.0–36.0)
MCV: 91.9 fL (ref 78.0–100.0)
MONOS PCT: 6 % (ref 3–12)
Monocytes Absolute: 0.7 10*3/uL (ref 0.1–1.0)
NEUTROS ABS: 7.5 10*3/uL (ref 1.7–7.7)
NEUTROS PCT: 66 % (ref 43–77)
PLATELETS: 213 10*3/uL (ref 150–400)
RBC: 4.8 MIL/uL (ref 3.87–5.11)
RDW: 14.4 % (ref 11.5–15.5)
WBC: 11.3 10*3/uL — AB (ref 4.0–10.5)

## 2013-04-17 LAB — BASIC METABOLIC PANEL
BUN: 19 mg/dL (ref 6–23)
CALCIUM: 9.9 mg/dL (ref 8.4–10.5)
CO2: 29 mEq/L (ref 19–32)
Chloride: 99 mEq/L (ref 96–112)
Creatinine, Ser: 0.95 mg/dL (ref 0.50–1.10)
GFR, EST AFRICAN AMERICAN: 63 mL/min — AB (ref >90)
GFR, EST NON AFRICAN AMERICAN: 55 mL/min — AB (ref >90)
Glucose, Bld: 225 mg/dL — ABNORMAL HIGH (ref 70–99)
Potassium: 5 mEq/L (ref 3.7–5.3)
SODIUM: 138 meq/L (ref 137–147)

## 2013-04-17 LAB — URINE MICROSCOPIC-ADD ON

## 2013-04-17 LAB — TROPONIN I: Troponin I: <0.3 ng/mL (ref <0.30)

## 2013-04-17 MED ORDER — IBUPROFEN 800 MG PO TABS
800.0000 mg | ORAL_TABLET | Freq: Once | ORAL | Status: AC
  Administered 2013-04-17: 800 mg via ORAL
  Filled 2013-04-17: qty 1

## 2013-04-17 MED ORDER — SODIUM CHLORIDE 0.9 % IV BOLUS (SEPSIS)
500.0000 mL | Freq: Once | INTRAVENOUS | Status: AC
  Administered 2013-04-17: 500 mL via INTRAVENOUS

## 2013-04-17 MED ORDER — MECLIZINE HCL 12.5 MG PO TABS
12.5000 mg | ORAL_TABLET | Freq: Once | ORAL | Status: AC
  Administered 2013-04-17: 12.5 mg via ORAL
  Filled 2013-04-17: qty 1

## 2013-04-17 MED ORDER — FENTANYL CITRATE 0.05 MG/ML IJ SOLN
25.0000 ug | Freq: Once | INTRAMUSCULAR | Status: AC
  Administered 2013-04-17: 25 ug via INTRAVENOUS
  Filled 2013-04-17: qty 2

## 2013-04-17 MED ORDER — ONDANSETRON 8 MG PO TBDP
8.0000 mg | ORAL_TABLET | Freq: Once | ORAL | Status: AC
  Administered 2013-04-17: 8 mg via ORAL
  Filled 2013-04-17: qty 1

## 2013-04-17 MED ORDER — ACETAMINOPHEN 325 MG PO TABS
650.0000 mg | ORAL_TABLET | Freq: Once | ORAL | Status: AC
  Administered 2013-04-17: 650 mg via ORAL
  Filled 2013-04-17: qty 2

## 2013-04-17 NOTE — ED Notes (Signed)
Pt stats that she woke up with nausea this am, was attempting to take a shower this afternoon and while attempting to get out of the shower pt became n/v, blurred vision, was able to get to the bedroom before she passed out in the bedroom, denies any injury, was assisted to the bed by caregiver,

## 2013-04-17 NOTE — Discharge Instructions (Signed)
Followup your primary care Dr. this week.

## 2013-04-17 NOTE — ED Provider Notes (Signed)
CSN: [id number]     Arrival date & time 04/17/13  1744 History  This chart was scribed for Donnetta Hutching, MD by Blanchard Kelch, ED Scribe. The patient was seen in room APA17/APA17. Patient's care was started at 6:08 PM.    Chief Complaint  Patient presents with  . Loss of Consciousness      The history is provided by the patient. No language interpreter was used.    HPI Comments: Cathy [name] is a 80 y.o. female brought in by ambulance who presents to the Emergency Department complaining of an episode of syncope that occurred about four hours ago. She was feeling mildly nauseous this morning so she took an antiemetic and two Tylenol prior to getting in a bath. She was getting out of her bath when she had sudden onset of nausea. She was able to make it to her bed but states she then vomited and lost consciousness. She was carried out of her house by EMS. She is feeling improved now but reports persistent nausea.      Past Medical History  Diagnosis Date  . Hypertension     Unspecified  . Hyperlipidemia     Mixed, statin intolerance.  [name] CAD (coronary artery disease)     a. CAD s/p CABG 2003. b. Cath 2006: occluded OM vein graft. c. Anterior MI 04/15/12 s/p PTCA only to ramus (stenting unsuccessful at that time) d. NSTEMI 06/2012: failed med rx, s/p DES to ramus c/b vessel perforation tx with balloon/stenting; e. 02/1013 Cath: LM nl, LAD mod dzs mid, LIMA ok, LCX 139m, OM1 patent stent, VG->OM 100, RCA 100p, VG->PDA ok, EF 40-45%->Med Rx.  . S/P CABG (coronary artery bypass graft) 2003     2003  LIMA, LAD, SVG to the OM, SVG right coronary artery  . Thoracic ascending aortic aneurysm 2003    Ascending thoracic aneurysm repair with a graft at time of CABG  . Diabetes mellitus     Insulin dependent  . Gastroparesis   . Depression   . Allergy history, radiographic dye     Contrast dye allergy  . Previous back surgery   . Syncope 2008    Question?  2008  . Chronic systolic heart failure    . Ischemic cardiomyopathy 06/2011    a. Echo 04/19/12: EF 25-30%, mod LVH, diffuse HK, trivial AI  . Hypotension     October, 2013  . Hypothyroidism     Treated with low-dose Synthroid  . CKD (chronic kidney disease)   . Carotid artery disease     a. 80-99% prox RICA stenosis - with recent cardiac event 06/2012, favor waiting at least 1 month, optimally 3 months post PCI.  [name] Hypertriglyceridemia   . Statin intolerance   . Peripheral neuropathy   . PAF (paroxysmal atrial fibrillation)     a. Noted during 06/2012 hospitalization. Placed on amiodarone. Plan is for event monitor to assess for breakthrough, hold off anticoag unless additional AF seen.  [name] NSVT (nonsustained ventricular tachycardia)     a. Noted during 06/2012 hospitalization.  . Plavix Non-responder     a. Also allergic to brilinta.   Past Surgical History  Procedure Laterality Date  . Coronary artery bypass graft  05/05/2001     LIMA, LAD, SVG to the OM, SVG right coronary artery  . Bladder tacking    . Salivary gland surgery    . Total abdominal hysterectomy    . Back surgery    . Breast surgery    .  External ear surgery    . Cardiac catheterization  04/15/12, 07/05/12    60-70% proximal LAD, 95% mid AV groove circumflex s/p PTCA, proximal RCA occlusion, patent LIMA to LAD, patent SVG to distal RCA, chronically occluded SVG to circumflex; LVEF 25-30%  . Ercp N/A 11/04/2012    Procedure: ENDOSCOPIC RETROGRADE CHOLANGIOPANCREATOGRAPHY (ERCP);  Surgeon: Petra Kuba, MD;  Location: [facility] OR;  Service: Endoscopy;  Laterality: N/A;   Family History  Problem Relation Age of Onset  . Heart disease Other   . Heart disease Mother   . Hypertension Mother   . Heart disease Father   . Hypertension Father    History  Substance Use Topics  . Smoking status: Never Smoker   . Smokeless tobacco: Never Used  . Alcohol Use: 1.5 oz/week    3 drink(s) per week   OB History   Grav Para Term Preterm Abortions TAB SAB Ect Mult Living                  Review of Systems A complete 10 system review of systems was obtained and all systems are negative except as noted in the HPI and PMH.     Allergies  Brilinta; Iohexol; Atorvastatin; Citrus; Contrast media; Plavix; Zolpidem tartrate; and Milk-related compounds  Home Medications   Current Outpatient Rx  Name  Route  Sig  Dispense  Refill  . amiodarone (PACERONE) 200 MG tablet   Oral   Take 1 tablet (200 mg total) by mouth daily.   30 tablet   3   . aspirin EC 81 MG tablet   Oral   Take 81 mg by mouth every morning.         . carvedilol (COREG) 25 MG tablet   Oral   Take 1 tablet (25 mg total) by mouth 2 (two) times daily.   60 tablet   6   . cetirizine (ZYRTEC) 10 MG tablet   Oral   Take 10 mg by mouth daily.         [name] ezetimibe (ZETIA) 10 MG tablet   Oral   Take 1 tablet (10 mg total) by mouth daily.   30 tablet   3   . fluticasone (FLONASE) 50 MCG/ACT nasal spray   Each Nare   Place 1 spray into both nostrils daily as needed for allergies.   16 g   3   . folic acid (FOLVITE) 400 MCG tablet   Oral   Take 400 mcg by mouth daily.         . furosemide (LASIX) 40 MG tablet   Oral   Take 1 tablet (40 mg total) by mouth daily.   30 tablet   3   . insulin glargine (LANTUS) 100 UNIT/ML injection   Subcutaneous   Inject 0.6 mLs (60 Units total) into the skin at bedtime.   18 mL   3     Lantus Vials- 90 supply, 3 refills   . insulin lispro (HUMALOG) 100 UNIT/ML injection   Subcutaneous   Inject 11-14 Units into the skin 3 (three) times daily before meals. Sliding scale as you were doing before admission to the hospital.   10 mL   3   . isosorbide mononitrate (IMDUR) 30 MG 24 hr tablet   Oral   Take 1 tablet (30 mg total) by mouth every morning.   30 tablet   3   . levothyroxine (SYNTHROID, LEVOTHROID) 25 MCG tablet   Oral   Take  1 tablet (25 mcg total) by mouth daily before breakfast.   30 tablet   3   . meclizine  (ANTIVERT) 25 MG tablet   Oral   Take 1 tablet (25 mg total) by mouth 2 (two) times daily.   30 tablet   1   . promethazine (PHENERGAN) 25 MG tablet   Oral   Take 1 tablet (25 mg total) by mouth every 6 (six) hours as needed for nausea.   20 tablet   2   . ramipril (ALTACE) 2.5 MG capsule   Oral   Take 1 capsule (2.5 mg total) by mouth daily.   30 capsule   6   . acetaminophen (TYLENOL) 500 MG tablet   Oral   Take 1,000 mg by mouth every 6 (six) hours as needed for pain.          . nitroGLYCERIN (NITROSTAT) 0.4 MG SL tablet   Sublingual   Place 1 tablet (0.4 mg total) under the tongue every 5 (five) minutes as needed for chest pain. May repeat times three.   30 tablet   3    Triage Vitals: BP 130/56  Pulse 65  Temp(Src) 97.7 F (36.5 C) (Oral)  Resp 12  Ht 5\' 5"  (1.651 m)  Wt 195 lb (88.451 kg)  BMI 32.45 kg/m2  SpO2 94%  Physical Exam  Nursing note and vitals reviewed. Constitutional: She is oriented to person, place, and time. She appears well-developed and well-nourished.  HENT:  Head: Normocephalic and atraumatic.  Mouth/Throat: Mucous membranes are dry.  Eyes: Conjunctivae and EOM are normal. Pupils are equal, round, and reactive to light.  Neck: Normal range of motion. Neck supple.  Cardiovascular: Normal rate, regular rhythm and normal heart sounds.   Pulmonary/Chest: Effort normal and breath sounds normal.  Abdominal: Soft. Bowel sounds are normal.  Musculoskeletal: Normal range of motion.  Neurological: She is alert and oriented to person, place, and time.  Skin: Skin is warm and dry.  Psychiatric: She has a normal mood and affect. Her behavior is normal.    ED Course  Procedures (including critical care time)  DIAGNOSTIC STUDIES: Oxygen Saturation is 94% on room air, adequate by my interpretation.    COORDINATION OF CARE: 6:12 PM - Patient verbalizes understanding and agrees with treatment plan.  Results for orders placed during the  hospital encounter of 04/17/13  BASIC METABOLIC PANEL      Result Value Ref Range   Sodium 138  137 - 147 mEq/L   Potassium 5.0  3.7 - 5.3 mEq/L   Chloride 99  96 - 112 mEq/L   CO2 29  19 - 32 mEq/L   Glucose, Bld 225 (*) 70 - 99 mg/dL   BUN 19  6 - 23 mg/dL   Creatinine, Ser 1.610.95  0.50 - 1.10 mg/dL   Calcium 9.9  8.4 - 09.610.5 mg/dL   GFR calc non Af Amer 55 (*) >90 mL/min   GFR calc Af Amer 63 (*) >90 mL/min  CBC WITH DIFFERENTIAL      Result Value Ref Range   WBC 11.3 (*) 4.0 - 10.5 K/uL   RBC 4.80  3.87 - 5.11 MIL/uL   Hemoglobin 14.3  12.0 - 15.0 g/dL   HCT 04.544.1  40.936.0 - 81.146.0 %   MCV 91.9  78.0 - 100.0 fL   MCH 29.8  26.0 - 34.0 pg   MCHC 32.4  30.0 - 36.0 g/dL   RDW 91.414.4  78.211.5 - 95.615.5 %  Platelets 213  150 - 400 K/uL   Neutrophils Relative % 66  43 - 77 %   Neutro Abs 7.5  1.7 - 7.7 K/uL   Lymphocytes Relative 27  12 - 46 %   Lymphs Abs 3.0  0.7 - 4.0 K/uL   Monocytes Relative 6  3 - 12 %   Monocytes Absolute 0.7  0.1 - 1.0 K/uL   Eosinophils Relative 1  0 - 5 %   Eosinophils Absolute 0.1  0.0 - 0.7 K/uL   Basophils Relative 0  0 - 1 %   Basophils Absolute 0.0  0.0 - 0.1 K/uL  URINALYSIS, ROUTINE W REFLEX MICROSCOPIC      Result Value Ref Range   Color, Urine YELLOW  YELLOW   APPearance HAZY (*) CLEAR   Specific Gravity, Urine 1.015  1.005 - 1.030   pH 5.5  5.0 - 8.0   Glucose, UA NEGATIVE  NEGATIVE mg/dL   Hgb urine dipstick NEGATIVE  NEGATIVE   Bilirubin Urine NEGATIVE  NEGATIVE   Ketones, ur NEGATIVE  NEGATIVE mg/dL   Protein, ur NEGATIVE  NEGATIVE mg/dL   Urobilinogen, UA 0.2  0.0 - 1.0 mg/dL   Nitrite NEGATIVE  NEGATIVE   Leukocytes, UA TRACE (*) NEGATIVE  TROPONIN I      Result Value Ref Range   Troponin I <0.30  <0.30 ng/mL  URINE MICROSCOPIC-ADD ON      Result Value Ref Range   Squamous Epithelial / LPF RARE  RARE   WBC, UA 3-6  <3 WBC/hpf   Bacteria, UA MANY (*) RARE     Imaging Review No results found.   EKG Interpretation   Date/Time:   Sunday April 17 2013 17:56:44 EDT Ventricular Rate:  65 PR Interval:  178 QRS Duration: 124 QT Interval:  456 QTC Calculation: 474 R Axis:   -33 Text Interpretation:  Normal sinus rhythm Left axis deviation Septal  infarct (cited on or before 07-Jul-2012) Abnormal ECG When compared with  ECG of 12-Mar-2013 23:32, No significant change was found Confirmed by  Jazlynn Nemetz  MD, Ingrid Shifrin ([id number]) on 04/17/2013 6:53:10 PM      MDM   Final diagnoses:  Syncope    Patient feels better after IV fluids. She is alert. No neurological deficits. She appears to be in no acute distress. EKG, hemoglobin, troponin, urinalysis all negative  I personally performed the services described in this documentation, which was scribed in my presence. The recorded information has been reviewed and is accurate.    Donnetta Hutching, MD 04/17/13 [phone]

## 2013-04-17 NOTE — ED Notes (Signed)
Pt states she was nauseated and dizzy this morning. States she has a history of vertigo. States after getting her shower she passed out on the bed. States she is having a little chest pain that just started

## 2013-04-27 ENCOUNTER — Telehealth: Payer: Self-pay | Admitting: Family Medicine

## 2013-04-27 ENCOUNTER — Other Ambulatory Visit: Payer: Self-pay | Admitting: Family Medicine

## 2013-04-27 ENCOUNTER — Other Ambulatory Visit (HOSPITAL_COMMUNITY): Payer: Self-pay | Admitting: "Endocrinology

## 2013-04-27 DIAGNOSIS — E049 Nontoxic goiter, unspecified: Secondary | ICD-10-CM

## 2013-04-27 MED ORDER — BLOOD GLUCOSE MONITOR KIT: PACK | Status: DC

## 2013-04-27 NOTE — Telephone Encounter (Signed)
Pharmacy [facility] [phone] Pt is needing a meter and strips sent to the pharmacy she is having to use them instead of who she normally uses because they don't take her insurance anymore. She needs a different meter because they don't have the strips she normally uses.

## 2013-04-27 NOTE — Telephone Encounter (Signed)
Prescription sent to pharmacy. .   Call placed to patient and patient made aware.  

## 2013-05-04 ENCOUNTER — Encounter: Payer: Self-pay | Admitting: Family Medicine

## 2013-05-04 ENCOUNTER — Ambulatory Visit (INDEPENDENT_AMBULATORY_CARE_PROVIDER_SITE_OTHER): Payer: Medicare HMO | Admitting: Family Medicine

## 2013-05-04 VITALS — BP 122/60 | HR 64 | Temp 97.4°F | Resp 16 | Ht 65.0 in | Wt 202.0 lb

## 2013-05-04 DIAGNOSIS — E1149 Type 2 diabetes mellitus with other diabetic neurological complication: Secondary | ICD-10-CM

## 2013-05-04 DIAGNOSIS — I4891 Unspecified atrial fibrillation: Secondary | ICD-10-CM

## 2013-05-04 DIAGNOSIS — E785 Hyperlipidemia, unspecified: Secondary | ICD-10-CM

## 2013-05-04 DIAGNOSIS — N390 Urinary tract infection, site not specified: Secondary | ICD-10-CM

## 2013-05-04 DIAGNOSIS — Z789 Other specified health status: Secondary | ICD-10-CM

## 2013-05-04 MED ORDER — EZETIMIBE 10 MG PO TABS: 10.0000 mg | ORAL_TABLET | Freq: Every day | ORAL | Status: DC

## 2013-05-04 MED ORDER — CIPROFLOXACIN HCL 500 MG PO TABS: 500.0000 mg | ORAL_TABLET | Freq: Two times a day (BID) | ORAL | Status: DC

## 2013-05-04 NOTE — Assessment & Plan Note (Signed)
Pt has stopped amiodarone on her own, does not want to restart Exam stable today Will re-establish with cardiology, [facility] to get involved

## 2013-05-04 NOTE — Assessment & Plan Note (Signed)
Discussed taking zetia only once a day, we will sort things out with pharmacy

## 2013-05-04 NOTE — Progress Notes (Signed)
Patient ID: Cathy KohlerFreda H [name], female   DOB: 1930-03-04, 80 y.o.   MRN: [id number]   Subjective:    Patient ID: Cathy [name], female    DOB: 1930-03-04, 80 y.o.   MRN: [id number]  Patient presents for Medication refill and F/U UTI     Pt here to f/u chronic medical problems:    A fib on blood thinners and Amiodarone by cardiology, she researched the medication and felt it was causing her to have more UTI and problems with her blood sugar therefore she stopped the medication about a month ago, No recent CP or SOB   UTI- seen in ED after she had a syncopal episode told she had a UTI but was prescribed ay antibiotics, work up was otherwise benign. Still hs some dysuria, has not seen urology   DM- followed by endocrine- A1C still uncontrolled but improved to 8.6%  She has difficulty with transportation and medication management. States she keeps running out of zetia    Review Of Systems:  GEN- + fatigue, fever, weight loss,weakness, recent illness HEENT- denies eye drainage, change in vision, nasal discharge, CVS- denies chest pain, palpitations RESP- denies SOB, cough, wheeze ABD- denies N/V, change in stools, abd pain GU- + dysuria, hematuria, dribbling, incontinence MSK- +joint pain, muscle aches, injury Neuro- denies headache, dizziness, syncope, seizure activity       Objective:    BP 122/60  Pulse 64  Temp(Src) 97.4 F (36.3 C) (Oral)  Resp 16  Ht 5\' 5"  (1.651 m)  Wt 202 lb (91.627 kg)  BMI 33.61 kg/m2  GEN- NAD, alert and oriented x3, sitting in wheelchair, HEENT- PERRL, EOMI, non injected sclera, pink conjunctiva, oropharynx clear, Neck- Supple, no LAD CVS- RRR, no murmur RESP-CTAB decreased bases ABD-NABS,soft,NT,ND, no suprapubic tenderness EXT- No edema, venous stasis changes Pulses- Radial 2+        Assessment & Plan:      Problem List Items Addressed This Visit   None      Note: This dictation was prepared with Dragon dictation along with smaller  phrase technology. Any transcriptional errors that result from this process are unintentional.

## 2013-05-04 NOTE — Assessment & Plan Note (Signed)
Start cipro, no culture was sent, pt still with symptoms

## 2013-05-04 NOTE — Patient Instructions (Signed)
Start antibiotics as prescribed Continue current medication We will refill your cholesterol medication [facility] referral to help with transportation Appointment Dr. [place]is [place] in Evening  F/U 4 months

## 2013-05-04 NOTE — Assessment & Plan Note (Signed)
Obtain notes and labs from endocrine

## 2013-05-06 ENCOUNTER — Telehealth: Payer: Self-pay | Admitting: *Deleted

## 2013-05-06 DIAGNOSIS — I509 Heart failure, unspecified: Secondary | ICD-10-CM

## 2013-05-06 NOTE — Telephone Encounter (Signed)
[facility] and [facility] referral placed.   Call placed to pharmacy and Zetia was sent with 90 day supply on 03/21/2013.

## 2013-05-06 NOTE — Telephone Encounter (Signed)
Received e-mail requesting PA for Promethazine.   PA submitted.   Received fax from insurance reporting that PA is not required,

## 2013-05-11 ENCOUNTER — Ambulatory Visit: Payer: Medicare HMO | Admitting: Cardiovascular Disease

## 2013-05-11 ENCOUNTER — Telehealth: Payer: Self-pay | Admitting: Family Medicine

## 2013-05-11 MED ORDER — GLUCOSE BLOOD VI STRP: 1.0000 | ORAL_STRIP | Freq: Three times a day (TID) | Status: DC

## 2013-05-11 NOTE — Telephone Encounter (Signed)
rx faxed to pharmacy

## 2013-05-12 ENCOUNTER — Telehealth: Payer: Self-pay | Admitting: *Deleted

## 2013-05-12 NOTE — Telephone Encounter (Signed)
[facility] from [facility].   Requested PA for Promethazine.   PA submitted.   PA approved.

## 2013-05-17 ENCOUNTER — Other Ambulatory Visit: Payer: Self-pay | Admitting: *Deleted

## 2013-05-17 MED ORDER — ONDANSETRON HCL 4 MG PO TABS: 4.0000 mg | ORAL_TABLET | Freq: Four times a day (QID) | ORAL | Status: DC | PRN

## 2013-05-17 NOTE — Telephone Encounter (Signed)
[facility] stating [facility] insurance will not cover Promethazine.   MD made aware and new orders obtained for Zofran.   Prescription sent to pharmacy.

## 2013-05-20 ENCOUNTER — Encounter (HOSPITAL_COMMUNITY): Payer: Self-pay | Admitting: Pharmacy Technician

## 2013-05-20 ENCOUNTER — Telehealth: Payer: Self-pay | Admitting: *Deleted

## 2013-05-20 NOTE — Telephone Encounter (Signed)
Received PA request for test strips.   PA submitted.

## 2013-05-23 NOTE — Telephone Encounter (Signed)
Received determination.  PA not required.

## 2013-05-24 ENCOUNTER — Telehealth: Payer: Self-pay | Admitting: Cardiology

## 2013-05-24 ENCOUNTER — Encounter (HOSPITAL_COMMUNITY)
Admission: RE | Admit: 2013-05-24 | Discharge: 2013-05-24 | Disposition: A | Discharge status: Home / Self Care | Payer: Medicare HMO | Source: Ambulatory Visit | Attending: Ophthalmology | Admitting: Ophthalmology

## 2013-05-24 NOTE — Telephone Encounter (Signed)
Received telephone call today from Interview nurse at [facility]. Mrs. Cathy [name] Needs clearance for cataract surgery. [place] was last seen in [place] office on 03/04/13. At this time Dr. Myrtis SerKatz gave clearance for surgery. In March 2015 Mrs. Cathy [name] had syncope episode and also took Herself off of Amiodarone. Day hospital needs an updated note for clearance to have cataract surgery On May 12th, 2015.  We will need to contact Day hospital [phone] with updated information.

## 2013-05-24 NOTE — Patient Instructions (Addendum)
Your procedure is scheduled on:  05/31/2013  Report to [facility] at   7:30am   AM.  Call this number if you have problems the morning of surgery: [phone]   Remember:   Do not eat or drink :After Midnight.  No insulin am of surgery     Take these medicines the morning of surgery with A SIP OF WATER:Carvedilol, Zetia,   Imdur  Levothyroxine and Ramipril   Do not wear jewelry, make-up or nail polish.  Do not wear lotions, powders, or perfumes. You may wear deodorant.  Do not shave 48 hours prior to surgery.  Do not bring valuables to the hospital.  Contacts, dentures or bridgework may not be worn into surgery.  Patients discharged the day of surgery will not be allowed to drive home.  Name and phone number of your driver:    Please read over the following fact sheets that you were given: Pain Booklet, Surgical Site Infection Prevention, Anesthesia Post-op Instructions and Care and Recovery After Surgery  Cataract Surgery  A cataract is a clouding of the lens of the eye. When a lens becomes cloudy, vision is reduced based on the degree and nature of the clouding. Surgery may be needed to improve vision. Surgery removes the cloudy lens and usually replaces it with a substitute lens (intraocular lens, IOL). LET YOUR EYE DOCTOR KNOW ABOUT:  Allergies to food or medicine.   Medicines taken including herbs, eyedrops, over-the-counter medicines, and creams.   Use of steroids (by mouth or creams).   Previous problems with anesthetics or numbing medicine.   History of bleeding problems or blood clots.   Previous surgery.   Other health problems, including diabetes and kidney problems.   Possibility of pregnancy, if this applies.  RISKS AND COMPLICATIONS  Infection.   Inflammation of the eyeball (endophthalmitis) that can spread to both eyes (sympathetic ophthalmia).   Poor wound healing.   If an IOL is inserted, it can later fall out of proper position. This is very uncommon.     Clouding of the part of your eye that holds an IOL in place. This is called an "after-cataract." These are uncommon, but easily treated.  BEFORE THE PROCEDURE  Do not eat or drink anything except small amounts of water for 8 to 12 before your surgery, or as directed by your caregiver.   Unless you are told otherwise, continue any eyedrops you have been prescribed.   Talk to your primary caregiver about all other medicines that you take (both prescription and non-prescription). In some cases, you may need to stop or change medicines near the time of your surgery. This is most important if you are taking blood-thinning medicine.Do not stop medicines unless you are told to do so.   Arrange for someone to drive you to and from the procedure.   Do not put contact lenses in either eye on the day of your surgery.  PROCEDURE There is more than one method for safely removing a cataract. Your doctor can explain the differences and help determine which is best for you. Phacoemulsification surgery is the most common form of cataract surgery.  An injection is given behind the eye or eyedrops are given to make this a painless procedure.   A small cut (incision) is made on the edge of the clear, dome-shaped surface that covers the front of the eye (cornea).   A tiny probe is painlessly inserted into the eye. This device gives off ultrasound waves that  soften and break up the cloudy center of the lens. This makes it easier for the cloudy lens to be removed by suction.   An IOL may be implanted.   The normal lens of the eye is covered by a clear capsule. Part of that capsule is intentionally left in the eye to support the IOL.   Your surgeon may or may not use stitches to close the incision.  There are other forms of cataract surgery that require a larger incision and stiches to close the eye. This approach is taken in cases where the doctor feels that the cataract cannot be easily removed using  phacoemulsification. AFTER THE PROCEDURE  When an IOL is implanted, it does not need care. It becomes a permanent part of your eye and cannot be seen or felt.   Your doctor will schedule follow-up exams to check on your progress.   Review your other medicines with your doctor to see which can be resumed after surgery.   Use eyedrops or take medicine as prescribed by your doctor.  Document Released: 12/26/2010 Document Reviewed: 12/23/2010 [facility] Patient Information 2012 [place], [place].  .Cataract Surgery Care After Refer to this sheet in the next few weeks. These instructions provide you with information on caring for yourself after your procedure. Your caregiver may also give you more specific instructions. Your treatment has been planned according to current medical practices, but problems sometimes occur. Call your caregiver if you have any problems or questions after your procedure.  HOME CARE INSTRUCTIONS   Avoid strenuous activities as directed by your caregiver.   Ask your caregiver when you can resume driving.   Use eyedrops or other medicines to help healing and control pressure inside your eye as directed by your caregiver.   Only take over-the-counter or prescription medicines for pain, discomfort, or fever as directed by your caregiver.   Do not to touch or rub your eyes.   You may be instructed to use a protective shield during the first few days and nights after surgery. If not, wear sunglasses to protect your eyes. This is to protect the eye from pressure or from being accidentally bumped.   Keep the area around your eye clean and dry. Avoid swimming or allowing water to hit you directly in the face while showering. Keep soap and shampoo out of your eyes.   Do not bend or lift heavy objects. Bending increases pressure in the eye. You can walk, climb stairs, and do light household chores.   Do not put a contact lens into the eye that had surgery until your caregiver  says it is okay to do so.   Ask your doctor when you can return to work. This will depend on the kind of work that you do. If you work in a dusty environment, you may be advised to wear protective eyewear for a period of time.   Ask your caregiver when it will be safe to engage in sexual activity.   Continue with your regular eye exams as directed by your caregiver.  What to expect:  It is normal to feel itching and mild discomfort for a few days after cataract surgery. Some fluid discharge is also common, and your eye may be sensitive to light and touch.   After 1 to 2 days, even moderate discomfort should disappear. In most cases, healing will take about 6 weeks.   If you received an intraocular lens (IOL), you may notice that colors are very bright or  have a blue tinge. Also, if you have been in bright sunlight, everything may appear reddish for a few hours. If you see these color tinges, it is because your lens is clear and no longer cloudy. Within a few months after receiving an IOL, these extra colors should go away. When you have healed, you will probably need new glasses.  SEEK MEDICAL CARE IF:   You have increased bruising around your eye.   You have discomfort not helped by medicine.  SEEK IMMEDIATE MEDICAL CARE IF:   You have a fever.   You have a worsening or sudden vision loss.   You have redness, swelling, or increasing pain in the eye.   You have a thick discharge from the eye that had surgery.  MAKE SURE YOU:  Understand these instructions.   Will watch your condition.   Will get help right away if you are not doing well or get worse.  Document Released: 07/26/2004 Document Revised: 12/26/2010 Document Reviewed: 08/30/2010 [facility] Patient Information 2012 [place], [place].    Monitored Anesthesia Care  Monitored anesthesia care is an anesthesia service for a medical procedure. Anesthesia is the loss of the ability to feel pain. It is produced by medications  called anesthetics. It may affect a small area of your body (local anesthesia), a large area of your body (regional anesthesia), or your entire body (general anesthesia). The need for monitored anesthesia care depends your procedure, your condition, and the potential need for regional or general anesthesia. It is often provided during procedures where:   General anesthesia may be needed if there are complications. This is because you need special care when you are under general anesthesia.   You will be under local or regional anesthesia. This is so that you are able to have higher levels of anesthesia if needed.   You will receive calming medications (sedatives). This is especially the case if sedatives are given to put you in a semi-conscious state of relaxation (deep sedation). This is because the amount of sedative needed to produce this state can be hard to predict. Too much of a sedative can produce general anesthesia. Monitored anesthesia care is performed by one or more caregivers who have special training in all types of anesthesia. You will need to meet with these caregivers before your procedure. During this meeting, they will ask you about your medical history. They will also give you instructions to follow. (For example, you will need to stop eating and drinking before your procedure. You may also need to stop or change medications you are taking.) During your procedure, your caregivers will stay with you. They will:   Watch your condition. This includes watching you blood pressure, breathing, and level of pain.   Diagnose and treat problems that occur.   Give medications if they are needed. These may include calming medications (sedatives) and anesthetics.   Make sure you are comfortable.  Having monitored anesthesia care does not necessarily mean that you will be under anesthesia. It does mean that your caregivers will be able to manage anesthesia if you need it or if it occurs.  It also means that you will be able to have a different type of anesthesia than you are having if you need it. When your procedure is complete, your caregivers will continue to watch your condition. They will make sure any medications wear off before you are allowed to go home.  Document Released: 10/02/2004 Document Revised: 05/03/2012 Document Reviewed: 02/18/2012 ExitCare Patient Information  2014 Exit[employer], LLC.  

## 2013-05-24 NOTE — Pre-Procedure Instructions (Signed)
Pt came in fore pre-op interview today for upcoming surgery on 05/31/13. Pt received Cardiology clearance from Dr. My[place]is [place]Katz in [place] on 03/04/13. Pt then had syncopal episode at home and went to the ER on 04/17/13. Pt saw Dr. Jeanice Limurham after syncopal episode for f/u on 05/04/13. Per Dr. Deirdre Peerurham's note he said they were referring pt to see Dr. Myrtis SerKatz due to the syncopal episode. Pt has yet to see Dr. Myrtis SerKatz since this syncopal episode and thus needs new cardiology clearance from him. Dr. Myrtis SerKatz office called to schedule appt and Vicky, pt care coordinator, said that he is not in the office right now and won't be back until after pt is scheduled for surgery. I asked if she could be seen by another cardiologist in the office before he surgery was scheduled on 05/31/13, but the other cardiologist are concerned about clearing her because she is not a regular pt with them. Vicky said she would give Dr. Myrtis SerKatz a call to see what he wanted to do about clearing her for surgery. Pt said she does not have a working phone number and doesn't know when she will have one again so she said to call her neighbor, Patterson HammersmithBetty Barton, at [phone] when Dr. Myrtis SerKatz will be able to see her for an appt to give her clearance. Vicky, pt care coordinator, at Dr. Myrtis SerKatz office was notified and given the neighbor's name and phone number and she will be in contact with her as soon as she hears from Dr. Myrtis SerKatz. Vicky said they will call [facility] to let us know when the appt is made so we can be on the lookout for clearance via Epic.

## 2013-05-25 ENCOUNTER — Encounter: Payer: Self-pay | Admitting: Cardiology

## 2013-05-25 NOTE — Telephone Encounter (Signed)
The patient is cleared for her cataract surgery. I have reviewed the record completely concerning a recent catheterization and the episode of syncope for which she was seen in the emergency room in March, 2015.

## 2013-05-25 NOTE — Telephone Encounter (Signed)
[facility] Fax # [phone]

## 2013-05-26 ENCOUNTER — Encounter (HOSPITAL_COMMUNITY): Payer: Self-pay | Admitting: Pharmacy Technician

## 2013-05-27 ENCOUNTER — Ambulatory Visit: Payer: Medicare HMO | Admitting: Cardiovascular Disease

## 2013-05-30 ENCOUNTER — Other Ambulatory Visit: Payer: Self-pay | Admitting: *Deleted

## 2013-05-30 MED ORDER — MECLIZINE HCL 25 MG PO TABS: 25.0000 mg | ORAL_TABLET | Freq: Two times a day (BID) | ORAL | Status: DC

## 2013-05-30 NOTE — Telephone Encounter (Signed)
Refill appropriate and filled per protocol. 

## 2013-05-31 ENCOUNTER — Ambulatory Visit (HOSPITAL_COMMUNITY)
Admission: RE | Admit: 2013-05-31 | Discharge: 2013-05-31 | Disposition: A | Discharge status: Home / Self Care | Payer: Medicare HMO | Source: Ambulatory Visit | Attending: Ophthalmology | Admitting: Ophthalmology

## 2013-05-31 ENCOUNTER — Encounter (HOSPITAL_COMMUNITY): Payer: Self-pay | Admitting: *Deleted

## 2013-05-31 ENCOUNTER — Ambulatory Visit (HOSPITAL_COMMUNITY): Payer: Medicare HMO | Admitting: Anesthesiology

## 2013-05-31 ENCOUNTER — Encounter (HOSPITAL_COMMUNITY)
Admission: RE | Disposition: A | Discharge status: Home / Self Care | Payer: Self-pay | Source: Ambulatory Visit | Attending: Ophthalmology

## 2013-05-31 ENCOUNTER — Encounter (HOSPITAL_COMMUNITY): Payer: Medicare HMO | Admitting: Anesthesiology

## 2013-05-31 DIAGNOSIS — Z7982 Long term (current) use of aspirin: Secondary | ICD-10-CM | POA: Insufficient documentation

## 2013-05-31 DIAGNOSIS — M129 Arthropathy, unspecified: Secondary | ICD-10-CM | POA: Insufficient documentation

## 2013-05-31 DIAGNOSIS — Z9861 Coronary angioplasty status: Secondary | ICD-10-CM | POA: Insufficient documentation

## 2013-05-31 DIAGNOSIS — I499 Cardiac arrhythmia, unspecified: Secondary | ICD-10-CM | POA: Insufficient documentation

## 2013-05-31 DIAGNOSIS — Z888 Allergy status to other drugs, medicaments and biological substances status: Secondary | ICD-10-CM | POA: Insufficient documentation

## 2013-05-31 DIAGNOSIS — Z79899 Other long term (current) drug therapy: Secondary | ICD-10-CM | POA: Insufficient documentation

## 2013-05-31 DIAGNOSIS — H2510 Age-related nuclear cataract, unspecified eye: Secondary | ICD-10-CM | POA: Insufficient documentation

## 2013-05-31 DIAGNOSIS — I2510 Atherosclerotic heart disease of native coronary artery without angina pectoris: Secondary | ICD-10-CM | POA: Insufficient documentation

## 2013-05-31 DIAGNOSIS — E119 Type 2 diabetes mellitus without complications: Secondary | ICD-10-CM | POA: Insufficient documentation

## 2013-05-31 DIAGNOSIS — I739 Peripheral vascular disease, unspecified: Secondary | ICD-10-CM | POA: Insufficient documentation

## 2013-05-31 DIAGNOSIS — E039 Hypothyroidism, unspecified: Secondary | ICD-10-CM | POA: Insufficient documentation

## 2013-05-31 DIAGNOSIS — N289 Disorder of kidney and ureter, unspecified: Secondary | ICD-10-CM | POA: Insufficient documentation

## 2013-05-31 DIAGNOSIS — I509 Heart failure, unspecified: Secondary | ICD-10-CM | POA: Insufficient documentation

## 2013-05-31 DIAGNOSIS — Z91040 Latex allergy status: Secondary | ICD-10-CM | POA: Insufficient documentation

## 2013-05-31 DIAGNOSIS — Z794 Long term (current) use of insulin: Secondary | ICD-10-CM | POA: Insufficient documentation

## 2013-05-31 DIAGNOSIS — I252 Old myocardial infarction: Secondary | ICD-10-CM | POA: Insufficient documentation

## 2013-05-31 DIAGNOSIS — I10 Essential (primary) hypertension: Secondary | ICD-10-CM | POA: Insufficient documentation

## 2013-05-31 DIAGNOSIS — I209 Angina pectoris, unspecified: Secondary | ICD-10-CM | POA: Insufficient documentation

## 2013-05-31 HISTORY — PX: CATARACT EXTRACTION W/PHACO: SHX586

## 2013-05-31 LAB — GLUCOSE, CAPILLARY: Glucose-Capillary: 230 mg/dL — ABNORMAL HIGH (ref 70–99)

## 2013-05-31 SURGERY — PHACOEMULSIFICATION, CATARACT, WITH IOL INSERTION
Anesthesia: Monitor Anesthesia Care | Site: Eye | Laterality: Right

## 2013-05-31 MED ORDER — LACTATED RINGERS IV SOLN
INTRAVENOUS | Status: DC | PRN
  Administered 2013-05-31: 09:00:00 via INTRAVENOUS

## 2013-05-31 MED ORDER — BSS IO SOLN
INTRAOCULAR | Status: DC | PRN
  Administered 2013-05-31: 09:00:00

## 2013-05-31 MED ORDER — TRYPAN BLUE 0.15 % OP SOLN
OPHTHALMIC | Status: DC | PRN
  Administered 2013-05-31: 0.5 mL via INTRAVITREAL

## 2013-05-31 MED ORDER — FENTANYL CITRATE 0.05 MG/ML IJ SOLN
INTRAMUSCULAR | Status: AC
  Filled 2013-05-31: qty 2

## 2013-05-31 MED ORDER — MIDAZOLAM HCL 2 MG/2ML IJ SOLN
INTRAMUSCULAR | Status: AC
  Filled 2013-05-31: qty 2

## 2013-05-31 MED ORDER — PHENYLEPHRINE HCL 2.5 % OP SOLN
OPHTHALMIC | Status: AC
  Filled 2013-05-31: qty 15

## 2013-05-31 MED ORDER — KETOROLAC TROMETHAMINE 0.5 % OP SOLN
OPHTHALMIC | Status: AC
  Filled 2013-05-31: qty 5

## 2013-05-31 MED ORDER — CYCLOPENTOLATE-PHENYLEPHRINE 0.2-1 % OP SOLN
1.0000 [drp] | OPHTHALMIC | Status: AC
  Administered 2013-05-31 (×3): 1 [drp] via OPHTHALMIC

## 2013-05-31 MED ORDER — MIDAZOLAM HCL 2 MG/2ML IJ SOLN
1.0000 mg | INTRAMUSCULAR | Status: DC | PRN
  Administered 2013-05-31: 1 mg via INTRAVENOUS

## 2013-05-31 MED ORDER — TETRACAINE 0.5 % OP SOLN OPTIME - NO CHARGE
OPHTHALMIC | Status: DC | PRN
  Administered 2013-05-31: 1 [drp] via OPHTHALMIC

## 2013-05-31 MED ORDER — FENTANYL CITRATE 0.05 MG/ML IJ SOLN
25.0000 ug | INTRAMUSCULAR | Status: AC
  Administered 2013-05-31 (×2): 25 ug via INTRAVENOUS

## 2013-05-31 MED ORDER — TRYPAN BLUE 0.06 % OP SOLN
OPHTHALMIC | Status: AC
  Filled 2013-05-31: qty 0.5

## 2013-05-31 MED ORDER — LACTATED RINGERS IV SOLN
INTRAVENOUS | Status: DC
Start: 2013-05-31 — End: 2013-05-31
  Administered 2013-05-31: 09:00:00 via INTRAVENOUS

## 2013-05-31 MED ORDER — KETOROLAC TROMETHAMINE 0.5 % OP SOLN
1.0000 [drp] | OPHTHALMIC | Status: AC
  Administered 2013-05-31 (×3): 1 [drp] via OPHTHALMIC

## 2013-05-31 MED ORDER — PROVISC 10 MG/ML IO SOLN
INTRAOCULAR | Status: DC | PRN
  Administered 2013-05-31: 0.85 mL via INTRAOCULAR

## 2013-05-31 MED ORDER — PHENYLEPHRINE HCL 2.5 % OP SOLN
1.0000 [drp] | OPHTHALMIC | Status: AC
  Administered 2013-05-31 (×3): 1 [drp] via OPHTHALMIC

## 2013-05-31 MED ORDER — CYCLOPENTOLATE-PHENYLEPHRINE OP SOLN OPTIME - NO CHARGE
OPHTHALMIC | Status: AC
  Filled 2013-05-31: qty 2

## 2013-05-31 MED ORDER — LACTATED RINGERS IV SOLN: INTRAVENOUS | Status: DC | PRN

## 2013-05-31 MED ORDER — BSS IO SOLN
INTRAOCULAR | Status: DC | PRN
  Administered 2013-05-31: 15 mL via INTRAOCULAR

## 2013-05-31 MED ORDER — TETRACAINE HCL 0.5 % OP SOLN
1.0000 [drp] | OPHTHALMIC | Status: AC
  Administered 2013-05-31 (×3): 1 [drp] via OPHTHALMIC

## 2013-05-31 MED ORDER — TETRACAINE HCL 0.5 % OP SOLN
OPHTHALMIC | Status: AC
  Filled 2013-05-31: qty 2

## 2013-05-31 SURGICAL SUPPLY — 26 items
CAPSULAR TENSION RING-AMO (OPHTHALMIC RELATED) IMPLANT
CLOTH BEACON ORANGE TIMEOUT ST (SAFETY) ×2 IMPLANT
EYE SHIELD UNIVERSAL CLEAR (GAUZE/BANDAGES/DRESSINGS) ×2 IMPLANT
GLOVE BIO SURGEON STRL SZ 6.5 (GLOVE) ×1 IMPLANT
GLOVE BIO SURGEONS STRL SZ 6.5 (GLOVE) ×1
GLOVE BIOGEL PI IND STRL 7.0 (GLOVE) IMPLANT
GLOVE BIOGEL PI INDICATOR 7.0 (GLOVE) ×2
GLOVE ECLIPSE 6.5 STRL STRAW (GLOVE) IMPLANT
GLOVE ECLIPSE 7.0 STRL STRAW (GLOVE) IMPLANT
GLOVE EXAM NITRILE LRG STRL (GLOVE) IMPLANT
GLOVE EXAM NITRILE MD LF STRL (GLOVE) IMPLANT
GLOVE SKINSENSE NS SZ6.5 (GLOVE)
GLOVE SKINSENSE STRL SZ6.5 (GLOVE) IMPLANT
HEALON 5 0.6 ML (INTRAOCULAR LENS) IMPLANT
KIT VITRECTOMY (OPHTHALMIC RELATED) IMPLANT
PAD ARMBOARD 7.5X6 YLW CONV (MISCELLANEOUS) ×2 IMPLANT
PROC W NO LENS (INTRAOCULAR LENS)
PROC W SPEC LENS (INTRAOCULAR LENS)
PROCESS W NO LENS (INTRAOCULAR LENS) IMPLANT
PROCESS W SPEC LENS (INTRAOCULAR LENS) IMPLANT
RING MALYGIN (MISCELLANEOUS) ×2 IMPLANT
SIGHTPATH CAT PROC W REG LENS (Ophthalmic Related) ×3 IMPLANT
TAPE SURG TRANSPORE 1 IN (GAUZE/BANDAGES/DRESSINGS) IMPLANT
TAPE SURGICAL TRANSPORE 1 IN (GAUZE/BANDAGES/DRESSINGS) ×2
VISCOELASTIC ADDITIONAL (OPHTHALMIC RELATED) IMPLANT
WATER STERILE IRR 250ML POUR (IV SOLUTION) ×2 IMPLANT

## 2013-05-31 NOTE — Transfer of Care (Signed)
Immediate Anesthesia Transfer of Care Note  Patient: Cathy [name]  Procedure(s) Performed: Procedure(s) with comments: CATARACT EXTRACTION PHACO AND INTRAOCULAR LENS PLACEMENT (IOC) (Right) - CDE: 26.18   Patient Location: Short Stay  Anesthesia Type:MAC  Level of Consciousness: awake, alert , oriented and patient cooperative  Airway & Oxygen Therapy: Patient Spontanous Breathing  Post-op Assessment: Report given to PACU RN, Post -op Vital signs reviewed and stable and Patient moving all extremities  Post vital signs: Reviewed and stable  Complications: No apparent anesthesia complications

## 2013-05-31 NOTE — H&P (Signed)
The patient was re examined and there is no change in the patients condition since the original H and P. 

## 2013-05-31 NOTE — Anesthesia Postprocedure Evaluation (Signed)
  Anesthesia Post-op Note  Patient: Cathy [name]  Procedure(s) Performed: Procedure(s) with comments: CATARACT EXTRACTION PHACO AND INTRAOCULAR LENS PLACEMENT (IOC) (Right) - CDE: 26.18   Patient Location: Short Stay  Anesthesia Type:MAC  Level of Consciousness: awake, alert , oriented and patient cooperative  Airway and Oxygen Therapy: Patient Spontanous Breathing  Post-op Pain: none  Post-op Assessment: Post-op Vital signs reviewed, Patient's Cardiovascular Status Stable, Respiratory Function Stable, Patent Airway and Pain level controlled  Post-op Vital Signs: Reviewed and stable  Last Vitals:  Filed Vitals:   05/31/13 0845  BP: 113/58  Temp:   Resp: 43    Complications: No apparent anesthesia complications

## 2013-05-31 NOTE — Discharge Instructions (Signed)
Cathy KohlerFreda H [name]  05/31/2013           [facility] Instructions [address]- [place] [id number] [address]      1. Avoid closing eyes tightly. One often closes the eye tightly when laughing, talking, sneezing, coughing or if they feel irritated. At these times, you should be careful not to close your eyes tightly.  2. Instill eye drops as instructed. To instill drops in your eye, open it, look up and have someone gently pull the lower lid down and instill a couple of drops inside the lower lid.  3. Do not touch upper lid.  4. Take Advil or Tylenol for pain.  5. You may use either eye for near work, such as reading or sewing and you may watch television.  6. You may have your hair done at the beauty parlor at any time.  7. Wear dark glasses with or without your own glasses if you are in bright light.  8. Call our office at [phone] or [phone] if you have sharp pain in your eye or unusual symptoms.  9. Do not be concerned because vision in the operative eye is not good. It will not be good, no matter how successful the operation, until you get a special lens for it. Your old glasses will not be suited to the new eye that was operated on and you will not be ready for a new lens for about a month.  10. [facility].    I have received a copy of the above instructions and will follow them.

## 2013-05-31 NOTE — Op Note (Signed)
Patient brought to the operating room and prepped and draped in the usual manner.  Lid speculum inserted in right eye.  Stab incision made at the twelve o'clock position.  Provisc instilled in the anterior chamber.   A 2.4 mm. Stab incision was made temporally.  Due to a small pupil, a Malugyn Ring was inserted.  Trypan Blue is instilled into the anterior chamber and removed with Provisc.  An anterior capsulotomy was done with a bent 25 gauge needle.  The nucleus was hydrodissected.  The Phaco tip was inserted in the anterior chamber and the nucleus was emulsified.  CDE was 26.18.  The cortical material was then removed with the I and A tip.  Posterior capsule was the polished.  The anterior chamber was deepened with Provisc.  A 21.0 Alcon SN60WF IOL was then inserted in the capsular bag.  The Malugyn Ring was removed.  Provisc was then removed with the I and A tip.  The wound was then hydrated.  Patient sent to the Recovery Room in good condition with follow up in my office.  Preoperative Diagnosis:  Nuclear Cataract OD Postoperative Diagnosis:  Same Procedure name: Kelman Phacoemulsification OD with IOL

## 2013-05-31 NOTE — Anesthesia Preprocedure Evaluation (Signed)
Anesthesia Evaluation    Reviewed: Allergy & Precautions, H&P , NPO status , Patient's Chart, lab work & pertinent test results  History of Anesthesia Complications Negative for: history of anesthetic complications  Airway Mallampati: II TM Distance: >3 FB Neck ROM: Full    Dental  (+) Edentulous Lower, Poor Dentition   Pulmonary neg pulmonary ROS, shortness of breath,  breath sounds clear to auscultation        Cardiovascular hypertension, Pt. on medications + angina + CAD, + Past MI, + Cardiac Stents, + Peripheral Vascular Disease and +CHF + dysrhythmias Rhythm:Regular Rate:Normal  Echo 04/19/12: EF 25-30%, mod LVH, diffuse HK, trivial AI   Neuro/Psych PSYCHIATRIC DISORDERS Depression negative neurological ROS     GI/Hepatic negative GI ROS, Neg liver ROS,   Endo/Other  diabetes, Insulin DependentHypothyroidism   Renal/GU Renal InsufficiencyRenal disease     Musculoskeletal   Abdominal   Peds  Hematology   Anesthesia Other Findings   Reproductive/Obstetrics                           Anesthesia Physical Anesthesia Plan  ASA: III  Anesthesia Plan: MAC   Post-op Pain Management:    Induction: Intravenous  Airway Management Planned: Nasal Cannula  Additional Equipment:   Intra-op Plan:   Post-operative Plan:   Informed Consent: I have reviewed the patients History and Physical, chart, labs and discussed the procedure including the risks, benefits and alternatives for the proposed anesthesia with the patient or authorized representative who has indicated his/her understanding and acceptance.     Plan Discussed with:   Anesthesia Plan Comments:         Anesthesia Quick Evaluation

## 2013-06-01 ENCOUNTER — Encounter (HOSPITAL_COMMUNITY): Payer: Self-pay | Admitting: Ophthalmology

## 2013-06-06 ENCOUNTER — Telehealth: Payer: Self-pay | Admitting: Family Medicine

## 2013-06-06 MED ORDER — INSULIN LISPRO 100 UNIT/ML ~~LOC~~ SOLN: SUBCUTANEOUS | Status: DC

## 2013-06-06 MED ORDER — INSULIN GLARGINE 100 UNIT/ML ~~LOC~~ SOLN: 60.0000 [IU] | Freq: Every day | SUBCUTANEOUS | Status: DC

## 2013-06-06 NOTE — Telephone Encounter (Signed)
Patient says we need to contact [facility] about her Humalog and latus we can call her back if we need to at [phone]

## 2013-06-06 NOTE — Telephone Encounter (Signed)
Prescription sent to pharmacy.

## 2013-06-07 ENCOUNTER — Encounter (HOSPITAL_COMMUNITY): Payer: Medicare HMO | Attending: Ophthalmology

## 2013-06-07 MED ORDER — ONDANSETRON HCL 4 MG/2ML IJ SOLN: 4.0000 mg | Freq: Once | INTRAMUSCULAR | Status: AC | PRN

## 2013-06-07 MED ORDER — FENTANYL CITRATE 0.05 MG/ML IJ SOLN
25.0000 ug | INTRAMUSCULAR | Status: DC | PRN
  Filled 2013-06-07: qty 2

## 2013-06-08 ENCOUNTER — Telehealth: Payer: Self-pay | Admitting: Family Medicine

## 2013-06-08 MED ORDER — INSULIN LISPRO 100 UNIT/ML ~~LOC~~ SOLN
SUBCUTANEOUS | Status: DC
Start: 2013-06-08 — End: 2013-10-23

## 2013-06-08 NOTE — Telephone Encounter (Signed)
Medication was sent to pharmacy with 90 day supply on 06/06/2013.   Call placed to patient and patient made aware.

## 2013-06-08 NOTE — Telephone Encounter (Signed)
Patient is calling to get refill on her Humalog if possible [facility] mail order please call her with questions at [phone]

## 2013-06-14 ENCOUNTER — Ambulatory Visit (HOSPITAL_COMMUNITY)
Admission: RE | Admit: 2013-06-14 | Discharge: 2013-06-14 | Disposition: A | Discharge status: Home / Self Care | Payer: Medicare HMO | Source: Ambulatory Visit | Attending: Ophthalmology | Admitting: Ophthalmology

## 2013-06-14 ENCOUNTER — Ambulatory Visit (HOSPITAL_COMMUNITY): Payer: Medicare HMO | Admitting: Anesthesiology

## 2013-06-14 ENCOUNTER — Encounter (HOSPITAL_COMMUNITY): Payer: Self-pay | Admitting: *Deleted

## 2013-06-14 ENCOUNTER — Encounter (HOSPITAL_COMMUNITY)
Admission: RE | Disposition: A | Discharge status: Home / Self Care | Payer: Self-pay | Source: Ambulatory Visit | Attending: Ophthalmology

## 2013-06-14 ENCOUNTER — Ambulatory Visit: Payer: Medicare HMO | Admitting: Cardiovascular Disease

## 2013-06-14 ENCOUNTER — Encounter (HOSPITAL_COMMUNITY): Payer: Medicare HMO | Admitting: Anesthesiology

## 2013-06-14 DIAGNOSIS — H2510 Age-related nuclear cataract, unspecified eye: Secondary | ICD-10-CM | POA: Insufficient documentation

## 2013-06-14 HISTORY — PX: CATARACT EXTRACTION W/PHACO: SHX586

## 2013-06-14 LAB — GLUCOSE, CAPILLARY: GLUCOSE-CAPILLARY: 158 mg/dL — AB (ref 70–99)

## 2013-06-14 SURGERY — PHACOEMULSIFICATION, CATARACT, WITH IOL INSERTION
Anesthesia: Monitor Anesthesia Care | Site: Eye | Laterality: Left

## 2013-06-14 MED ORDER — BSS IO SOLN
INTRAOCULAR | Status: DC | PRN
  Administered 2013-06-14: 15 mL via INTRAOCULAR

## 2013-06-14 MED ORDER — EPINEPHRINE HCL 1 MG/ML IJ SOLN
INTRAOCULAR | Status: DC | PRN
  Administered 2013-06-14: 09:00:00

## 2013-06-14 MED ORDER — PHENYLEPHRINE HCL 2.5 % OP SOLN
1.0000 [drp] | OPHTHALMIC | Status: AC | PRN
  Administered 2013-06-14 (×3): 1 [drp] via OPHTHALMIC

## 2013-06-14 MED ORDER — PROVISC 10 MG/ML IO SOLN
INTRAOCULAR | Status: DC | PRN
  Administered 2013-06-14: 0.85 mL via INTRAOCULAR

## 2013-06-14 MED ORDER — MIDAZOLAM HCL 2 MG/2ML IJ SOLN
1.0000 mg | INTRAMUSCULAR | Status: DC | PRN
  Administered 2013-06-14 (×2): 2 mg via INTRAVENOUS
  Filled 2013-06-14: qty 2

## 2013-06-14 MED ORDER — PHENYLEPHRINE HCL 2.5 % OP SOLN
OPHTHALMIC | Status: AC
  Filled 2013-06-14: qty 15

## 2013-06-14 MED ORDER — CYCLOPENTOLATE-PHENYLEPHRINE OP SOLN OPTIME - NO CHARGE
OPHTHALMIC | Status: AC
  Filled 2013-06-14: qty 2

## 2013-06-14 MED ORDER — LACTATED RINGERS IV SOLN
INTRAVENOUS | Status: DC
  Administered 2013-06-14: 1000 mL via INTRAVENOUS

## 2013-06-14 MED ORDER — TETRACAINE HCL 0.5 % OP SOLN
1.0000 [drp] | OPHTHALMIC | Status: AC | PRN
  Administered 2013-06-14 (×3): 1 [drp] via OPHTHALMIC

## 2013-06-14 MED ORDER — KETOROLAC TROMETHAMINE 0.5 % OP SOLN
1.0000 [drp] | OPHTHALMIC | Status: AC | PRN
  Administered 2013-06-14 (×3): 1 [drp] via OPHTHALMIC

## 2013-06-14 MED ORDER — TETRACAINE 0.5 % OP SOLN OPTIME - NO CHARGE
OPHTHALMIC | Status: DC | PRN
  Administered 2013-06-14: 2 [drp] via OPHTHALMIC

## 2013-06-14 MED ORDER — TETRACAINE HCL 0.5 % OP SOLN
OPHTHALMIC | Status: AC
  Filled 2013-06-14: qty 2

## 2013-06-14 MED ORDER — KETOROLAC TROMETHAMINE 0.5 % OP SOLN
OPHTHALMIC | Status: AC
  Filled 2013-06-14: qty 5

## 2013-06-14 MED ORDER — ONDANSETRON HCL 4 MG/2ML IJ SOLN: 4.0000 mg | Freq: Once | INTRAMUSCULAR | Status: DC | PRN

## 2013-06-14 MED ORDER — FENTANYL CITRATE 0.05 MG/ML IJ SOLN: 25.0000 ug | INTRAMUSCULAR | Status: DC | PRN

## 2013-06-14 MED ORDER — FENTANYL CITRATE 0.05 MG/ML IJ SOLN
25.0000 ug | INTRAMUSCULAR | Status: AC
  Administered 2013-06-14 (×2): 25 ug via INTRAVENOUS

## 2013-06-14 MED ORDER — MIDAZOLAM HCL 2 MG/2ML IJ SOLN
INTRAMUSCULAR | Status: AC
  Filled 2013-06-14: qty 2

## 2013-06-14 MED ORDER — EPINEPHRINE HCL 1 MG/ML IJ SOLN
INTRAMUSCULAR | Status: AC
  Filled 2013-06-14: qty 1

## 2013-06-14 MED ORDER — CYCLOPENTOLATE-PHENYLEPHRINE 0.2-1 % OP SOLN
1.0000 [drp] | OPHTHALMIC | Status: AC | PRN
  Administered 2013-06-14 (×3): 1 [drp] via OPHTHALMIC

## 2013-06-14 SURGICAL SUPPLY — 24 items
CAPSULAR TENSION RING-AMO (OPHTHALMIC RELATED) IMPLANT
CLOTH BEACON ORANGE TIMEOUT ST (SAFETY) ×2 IMPLANT
EYE SHIELD UNIVERSAL CLEAR (GAUZE/BANDAGES/DRESSINGS) ×2 IMPLANT
GLOVE BIO SURGEON STRL SZ 6.5 (GLOVE) ×1 IMPLANT
GLOVE BIO SURGEONS STRL SZ 6.5 (GLOVE) ×1
GLOVE ECLIPSE 6.5 STRL STRAW (GLOVE) IMPLANT
GLOVE ECLIPSE 7.0 STRL STRAW (GLOVE) IMPLANT
GLOVE EXAM NITRILE LRG STRL (GLOVE) IMPLANT
GLOVE EXAM NITRILE MD LF STRL (GLOVE) IMPLANT
GLOVE SKINSENSE NS SZ6.5 (GLOVE)
GLOVE SKINSENSE STRL SZ6.5 (GLOVE) IMPLANT
GLOVE SS N UNI LF 7.0 STRL (GLOVE) ×2 IMPLANT
HEALON 5 0.6 ML (INTRAOCULAR LENS) IMPLANT
KIT VITRECTOMY (OPHTHALMIC RELATED) IMPLANT
PAD ARMBOARD 7.5X6 YLW CONV (MISCELLANEOUS) ×2 IMPLANT
PROC W NO LENS (INTRAOCULAR LENS)
PROC W SPEC LENS (INTRAOCULAR LENS)
PROCESS W NO LENS (INTRAOCULAR LENS) IMPLANT
PROCESS W SPEC LENS (INTRAOCULAR LENS) IMPLANT
RING MALYGIN (MISCELLANEOUS) IMPLANT
SIGHTPATH CAT PROC W REG LENS (Ophthalmic Related) ×3 IMPLANT
TAPE CLOTH SOFT 2X10 (GAUZE/BANDAGES/DRESSINGS) ×2 IMPLANT
VISCOELASTIC ADDITIONAL (OPHTHALMIC RELATED) IMPLANT
WATER STERILE IRR 250ML POUR (IV SOLUTION) ×2 IMPLANT

## 2013-06-14 NOTE — Discharge Instructions (Signed)
KI BAYONA  06/14/2013           [facility] Instructions [address]- [place] [address]      1. Avoid closing eyes tightly. One often closes the eye tightly when laughing, talking, sneezing, coughing or if they feel irritated. At these times, you should be careful not to close your eyes tightly.  2. Instill eye drops as instructed. To instill drops in your eye, open it, look up and have someone gently pull the lower lid down and instill a couple of drops inside the lower lid.  3. Do not touch upper lid.  4. Take Advil or Tylenol for pain.  5. You may use either eye for near work, such as reading or sewing and you may watch television.  6. You may have your hair done at the beauty parlor at any time.  7. Wear dark glasses with or without your own glasses if you are in bright light.  8. Call our office at [phone] or [phone] if you have sharp pain in your eye or unusual symptoms.  9. Do not be concerned because vision in the operative eye is not good. It will not be good, no matter how successful the operation, until you get a special lens for it. Your old glasses will not be suited to the new eye that was operated on and you will not be ready for a new lens for about a month.  10. Follow up at the [facility] office.    I have received a copy of the above instructions and will follow them.     Follow up with Dr. Nile Riggs today between 2-3 pm

## 2013-06-14 NOTE — Op Note (Signed)
Patient brought to the operating room and prepped and draped in the usual manner.  Lid speculum inserted in left eye.  Stab incision made at the twelve o'clock position.  Provisc instilled in the anterior chamber.   A 2.4 mm. Stab incision was made temporally.  An anterior capsulotomy was done with a bent 25 gauge needle.  The nucleus was hydrodissected.  The Phaco tip was inserted in the anterior chamber and the nucleus was emulsified.  CDE was 11.35.  The cortical material was then removed with the I and A tip.  Posterior capsule was the polished.  The anterior chamber was deepened with Provisc.  A 21.0 Alcon SN60WF IOL was then inserted in the capsular bag.  Provisc was then removed with the I and A tip.  The wound was then hydrated.  Patient sent to the Recovery Room in good condition with follow up in my office.  Preoperative Diagnosis:  Nuclear Cataract OS Postoperative Diagnosis:  Same Procedure name: Kelman Phacoemulsification OS with IOL

## 2013-06-14 NOTE — H&P (Signed)
The patient was re examined and there is no change in the patients condition since the original H and P. 

## 2013-06-14 NOTE — Anesthesia Postprocedure Evaluation (Signed)
  Anesthesia Post-op Note  Patient: Cathy [name]  Procedure(s) Performed: Procedure(s) with comments: CATARACT EXTRACTION PHACO AND INTRAOCULAR LENS PLACEMENT (IOC) (Left) - CDE:11.35  Patient Location: Short Stay  Anesthesia Type:MAC  Level of Consciousness: awake, alert  and oriented  Airway and Oxygen Therapy: Patient Spontanous Breathing  Post-op Pain: none  Post-op Assessment: Post-op Vital signs reviewed, Patient's Cardiovascular Status Stable, Respiratory Function Stable, Patent Airway and No signs of Nausea or vomiting  Post-op Vital Signs: Reviewed and stable  Last Vitals:  Filed Vitals:   06/14/13 0845  BP: 160/73  Resp: 23    Complications: No apparent anesthesia complications

## 2013-06-14 NOTE — Anesthesia Preprocedure Evaluation (Signed)
Anesthesia Evaluation    Reviewed: Allergy & Precautions, H&P , NPO status , Patient's Chart, lab work & pertinent test results  History of Anesthesia Complications Negative for: history of anesthetic complications  Airway Mallampati: II TM Distance: >3 FB Neck ROM: Full    Dental  (+) Edentulous Lower, Poor Dentition   Pulmonary neg pulmonary ROS, shortness of breath,  breath sounds clear to auscultation        Cardiovascular hypertension, Pt. on medications + angina + CAD, + Past MI, + Cardiac Stents, + Peripheral Vascular Disease and +CHF + dysrhythmias Rhythm:Regular Rate:Normal  Echo 04/19/12: EF 25-30%, mod LVH, diffuse HK, trivial AI   Neuro/Psych PSYCHIATRIC DISORDERS Depression negative neurological ROS     GI/Hepatic negative GI ROS, Neg liver ROS,   Endo/Other  diabetes, Insulin DependentHypothyroidism   Renal/GU Renal InsufficiencyRenal disease     Musculoskeletal   Abdominal   Peds  Hematology   Anesthesia Other Findings   Reproductive/Obstetrics                           Anesthesia Physical Anesthesia Plan  ASA: III  Anesthesia Plan: MAC   Post-op Pain Management:    Induction: Intravenous  Airway Management Planned: Nasal Cannula  Additional Equipment:   Intra-op Plan:   Post-operative Plan:   Informed Consent: I have reviewed the patients History and Physical, chart, labs and discussed the procedure including the risks, benefits and alternatives for the proposed anesthesia with the patient or authorized representative who has indicated his/her understanding and acceptance.     Plan Discussed with:   Anesthesia Plan Comments:         Anesthesia Quick Evaluation

## 2013-06-14 NOTE — Transfer of Care (Signed)
Immediate Anesthesia Transfer of Care Note  Patient: Cathy [name]  Procedure(s) Performed: Procedure(s) with comments: CATARACT EXTRACTION PHACO AND INTRAOCULAR LENS PLACEMENT (IOC) (Left) - CDE:11.35  Patient Location: Short Stay  Anesthesia Type:MAC  Level of Consciousness: awake  Airway & Oxygen Therapy: Patient Spontanous Breathing  Post-op Assessment: Report given to PACU RN  Post vital signs: Reviewed  Complications: No apparent anesthesia complications

## 2013-06-15 ENCOUNTER — Encounter (HOSPITAL_COMMUNITY): Payer: Self-pay | Admitting: Ophthalmology

## 2013-06-20 ENCOUNTER — Ambulatory Visit (HOSPITAL_COMMUNITY): Payer: Medicare HMO

## 2013-06-24 ENCOUNTER — Other Ambulatory Visit: Payer: Self-pay | Admitting: Family Medicine

## 2013-06-28 ENCOUNTER — Ambulatory Visit (HOSPITAL_COMMUNITY): Payer: Medicare HMO

## 2013-07-01 ENCOUNTER — Other Ambulatory Visit: Payer: Self-pay | Admitting: Family Medicine

## 2013-07-01 ENCOUNTER — Ambulatory Visit (HOSPITAL_COMMUNITY)
Admission: RE | Admit: 2013-07-01 | Discharge: 2013-07-01 | Disposition: A | Discharge status: Home / Self Care | Payer: Medicare HMO | Source: Ambulatory Visit | Attending: "Endocrinology | Admitting: "Endocrinology

## 2013-07-01 ENCOUNTER — Telehealth: Payer: Self-pay | Admitting: *Deleted

## 2013-07-01 DIAGNOSIS — E042 Nontoxic multinodular goiter: Secondary | ICD-10-CM | POA: Insufficient documentation

## 2013-07-01 DIAGNOSIS — E039 Hypothyroidism, unspecified: Secondary | ICD-10-CM | POA: Insufficient documentation

## 2013-07-01 DIAGNOSIS — E049 Nontoxic goiter, unspecified: Secondary | ICD-10-CM

## 2013-07-01 MED ORDER — CIPROFLOXACIN HCL 500 MG PO TABS: 500.0000 mg | ORAL_TABLET | Freq: Two times a day (BID) | ORAL | Status: DC

## 2013-07-01 NOTE — Telephone Encounter (Signed)
Refill appropriate and filled per protocol. 

## 2013-07-01 NOTE — Telephone Encounter (Signed)
Prescription sent to pharmacy. .   Call placed to patient and patient made aware.  

## 2013-07-01 NOTE — Telephone Encounter (Signed)
Message copied by Phillips OdorSIX, Wade Asebedo H on Fri Jul 01, 2013  9:50 AM ------      Message from: Milinda AntisURHAM, KAWANTA F      Created: Fri Jul 01, 2013  8:41 AM      Regarding: RE: Urinary symptoms      Contact: [phone]       Convert to a phone note, send Cipro 500mg  BID x 7 days, have her schedule visit if not improving after the weekend       ----- Message -----         From: Durwin Norahristina H Bertine Schlottman, LPN         Sent: 07/01/2013   8:16 AM           To: Salley ScarletKawanta F Pimaco Two, MD      Subject: FW: Urinary symptoms                                                 ----- Message -----         From: Alinda SierrasAlisa Gammons Gilboy, RN         Sent: 06/30/2013   8:22 PM           To: Durwin Norahristina H Kery Haltiwanger, LPN      Subject: Urinary symptoms                                         Hi Sharbel Sahagun,             I faxed my visit note today to Dr. Jeanice Limurham to your attention. In particular would you please have Dr. Jeanice Limurham see the following portion of my visit note? Thank you!             Acute Health Condition (urinary complaints) - Mrs. Delton Seeelson reports urinary frequency, burning, and hesitancy. She says she feels like she has another urinary tract infection. She denies fever but says she feels more tired than usual and generally not well. She asked if I would notify Dr. Jeanice Limurham of this issue.             Marja Kayslisa Gilboy MHA,BSN,RN,CCM      [facility]       [phone]                    ------

## 2013-07-04 ENCOUNTER — Telehealth: Payer: Self-pay | Admitting: *Deleted

## 2013-07-04 NOTE — Telephone Encounter (Signed)
Submitted [employer] referral thru [employer] for authorization, authorization number is [id number] this information was faxed to Dr. Jorja Loaafeen at [facility] at [phone]

## 2013-07-08 ENCOUNTER — Telehealth: Payer: Self-pay | Admitting: *Deleted

## 2013-07-08 NOTE — Telephone Encounter (Signed)
Called pt to let her know that she is over due for colonoscopy and Mammogram and pt had refused to go and have any one of these done. States she does not have time for another dr. Alfonzo BeersAppt. I stressed to her how important it was to have done and she stated she is not going to do it.

## 2013-07-15 ENCOUNTER — Encounter (HOSPITAL_COMMUNITY): Payer: Self-pay | Admitting: Emergency Medicine

## 2013-07-15 ENCOUNTER — Emergency Department (HOSPITAL_COMMUNITY)
Admission: EM | Admit: 2013-07-15 | Discharge: 2013-07-15 | Disposition: A | Discharge status: Home / Self Care | Payer: Medicare HMO | Attending: Emergency Medicine | Admitting: Emergency Medicine

## 2013-07-15 DIAGNOSIS — I472 Ventricular tachycardia, unspecified: Secondary | ICD-10-CM | POA: Insufficient documentation

## 2013-07-15 DIAGNOSIS — I4729 Other ventricular tachycardia: Secondary | ICD-10-CM | POA: Insufficient documentation

## 2013-07-15 DIAGNOSIS — E119 Type 2 diabetes mellitus without complications: Secondary | ICD-10-CM | POA: Insufficient documentation

## 2013-07-15 DIAGNOSIS — G609 Hereditary and idiopathic neuropathy, unspecified: Secondary | ICD-10-CM | POA: Insufficient documentation

## 2013-07-15 DIAGNOSIS — Z8719 Personal history of other diseases of the digestive system: Secondary | ICD-10-CM | POA: Insufficient documentation

## 2013-07-15 DIAGNOSIS — I4891 Unspecified atrial fibrillation: Secondary | ICD-10-CM | POA: Insufficient documentation

## 2013-07-15 DIAGNOSIS — Z792 Long term (current) use of antibiotics: Secondary | ICD-10-CM | POA: Insufficient documentation

## 2013-07-15 DIAGNOSIS — E785 Hyperlipidemia, unspecified: Secondary | ICD-10-CM | POA: Insufficient documentation

## 2013-07-15 DIAGNOSIS — Z951 Presence of aortocoronary bypass graft: Secondary | ICD-10-CM | POA: Insufficient documentation

## 2013-07-15 DIAGNOSIS — E039 Hypothyroidism, unspecified: Secondary | ICD-10-CM | POA: Insufficient documentation

## 2013-07-15 DIAGNOSIS — N189 Chronic kidney disease, unspecified: Secondary | ICD-10-CM | POA: Insufficient documentation

## 2013-07-15 DIAGNOSIS — I5022 Chronic systolic (congestive) heart failure: Secondary | ICD-10-CM | POA: Insufficient documentation

## 2013-07-15 DIAGNOSIS — Z79899 Other long term (current) drug therapy: Secondary | ICD-10-CM | POA: Insufficient documentation

## 2013-07-15 DIAGNOSIS — I2510 Atherosclerotic heart disease of native coronary artery without angina pectoris: Secondary | ICD-10-CM | POA: Insufficient documentation

## 2013-07-15 DIAGNOSIS — Z794 Long term (current) use of insulin: Secondary | ICD-10-CM | POA: Insufficient documentation

## 2013-07-15 DIAGNOSIS — Z9889 Other specified postprocedural states: Secondary | ICD-10-CM | POA: Insufficient documentation

## 2013-07-15 DIAGNOSIS — Z7982 Long term (current) use of aspirin: Secondary | ICD-10-CM | POA: Insufficient documentation

## 2013-07-15 DIAGNOSIS — H00019 Hordeolum externum unspecified eye, unspecified eyelid: Secondary | ICD-10-CM | POA: Insufficient documentation

## 2013-07-15 DIAGNOSIS — I129 Hypertensive chronic kidney disease with stage 1 through stage 4 chronic kidney disease, or unspecified chronic kidney disease: Secondary | ICD-10-CM | POA: Insufficient documentation

## 2013-07-15 DIAGNOSIS — R21 Rash and other nonspecific skin eruption: Secondary | ICD-10-CM

## 2013-07-15 DIAGNOSIS — Z8659 Personal history of other mental and behavioral disorders: Secondary | ICD-10-CM | POA: Insufficient documentation

## 2013-07-15 LAB — BASIC METABOLIC PANEL
BUN: 17 mg/dL (ref 6–23)
CO2: 27 mEq/L (ref 19–32)
Calcium: 9.7 mg/dL (ref 8.4–10.5)
Chloride: 102 mEq/L (ref 96–112)
Creatinine, Ser: 0.76 mg/dL (ref 0.50–1.10)
GFR, EST AFRICAN AMERICAN: 89 mL/min — AB (ref >90)
GFR, EST NON AFRICAN AMERICAN: 77 mL/min — AB (ref >90)
GLUCOSE: 269 mg/dL — AB (ref 70–99)
Potassium: 4.6 mEq/L (ref 3.7–5.3)
SODIUM: 140 meq/L (ref 137–147)

## 2013-07-15 LAB — CBC WITH DIFFERENTIAL/PLATELET
BASOS PCT: 0 % (ref 0–1)
Basophils Absolute: 0 10*3/uL (ref 0.0–0.1)
EOS ABS: 0.1 10*3/uL (ref 0.0–0.7)
Eosinophils Relative: 1 % (ref 0–5)
HCT: 45.2 % (ref 36.0–46.0)
HEMOGLOBIN: 14.8 g/dL (ref 12.0–15.0)
LYMPHS ABS: 2.9 10*3/uL (ref 0.7–4.0)
Lymphocytes Relative: 32 % (ref 12–46)
MCH: 29.9 pg (ref 26.0–34.0)
MCHC: 32.7 g/dL (ref 30.0–36.0)
MCV: 91.3 fL (ref 78.0–100.0)
MONOS PCT: 8 % (ref 3–12)
Monocytes Absolute: 0.7 10*3/uL (ref 0.1–1.0)
NEUTROS PCT: 59 % (ref 43–77)
Neutro Abs: 5.3 10*3/uL (ref 1.7–7.7)
PLATELETS: 216 10*3/uL (ref 150–400)
RBC: 4.95 MIL/uL (ref 3.87–5.11)
RDW: 14.2 % (ref 11.5–15.5)
WBC: 9 10*3/uL (ref 4.0–10.5)

## 2013-07-15 MED ORDER — METHYLPREDNISOLONE SODIUM SUCC 125 MG IJ SOLR
125.0000 mg | Freq: Once | INTRAMUSCULAR | Status: AC
  Administered 2013-07-15: 125 mg via INTRAVENOUS
  Filled 2013-07-15: qty 2

## 2013-07-15 MED ORDER — DIPHENHYDRAMINE HCL 50 MG/ML IJ SOLN
25.0000 mg | Freq: Once | INTRAMUSCULAR | Status: AC
  Administered 2013-07-15: 25 mg via INTRAVENOUS
  Filled 2013-07-15: qty 1

## 2013-07-15 MED ORDER — TOBRAMYCIN 0.3 % OP SOLN: 1.0000 [drp] | OPHTHALMIC | Status: DC

## 2013-07-15 MED ORDER — HYDROXYZINE HCL 25 MG PO TABS: ORAL_TABLET | ORAL | Status: DC

## 2013-07-15 NOTE — ED Notes (Addendum)
Pt had eye surgery 4 weeks prior, has been using eye drops same amount of time. Pt states today she noticed facial edema, periorbital edema, also co chronic numbness in arms and face, not acute. Pt also co ear swelling and pain, Left.

## 2013-07-15 NOTE — Discharge Instructions (Signed)
Follow up with your md in 1-2 weeks. °

## 2013-07-15 NOTE — ED Provider Notes (Signed)
CSN: [id number]     Arrival date & time 07/15/13  1041 History  This chart was scribed for Benny Lennert, MD by Shari Heritage, ED Scribe. The patient was seen in room APA04/APA04. Patient's care was started at 11:41 AM.   Chief Complaint  Patient presents with  . Allergic Reaction     Patient is a 80 y.o. female presenting with allergic reaction. The history is provided by the patient. No language interpreter was used.  Allergic Reaction Presenting symptoms: rash   Rash:    Location:  Arm and leg   Duration:  4 weeks   Timing:  Constant Severity:  Mild Prior allergic episodes:  Food/nut allergies Ineffective treatments:  None tried   HPI Comments: Cathy [name] is a 80 y.o. female who presents to the Emergency Department complaining of oral, perioral, and facial swelling upon waking upon this morning. Patient also states that she has had constant numbness in her arms, face and legs. She says she has developed similar symptoms in the past during an allergic reaction to citrus. She also reports that she had eye surgery 4 weeks ago and has been using eye drops since the surgery. She has noticed a rash to her upper extremities which she believes may be related to eye drop use. She denies shortness of breath, dysphagia or other symptoms at this time. She has a medical history of hypertension, hyperlipidemia, CAD and CABG, DM, hypothyroidism, and renal disease.    Past Medical History  Diagnosis Date  . Hypertension     Unspecified  . Hyperlipidemia     Mixed, statin intolerance.  [name] CAD (coronary artery disease)     a. CAD s/p CABG 2003. b. Cath 2006: occluded OM vein graft. c. Anterior MI 04/15/12 s/p PTCA only to ramus (stenting unsuccessful at that time) d. NSTEMI 06/2012: failed med rx, s/p DES to ramus c/b vessel perforation tx with balloon/stenting; e. 02/1013 Cath: LM nl, LAD mod dzs mid, LIMA ok, LCX 163m, OM1 patent stent, VG->OM 100, RCA 100p, VG->PDA ok, EF 40-45%->Med Rx.  . S/P  CABG (coronary artery bypass graft) 2003     2003  LIMA, LAD, SVG to the OM, SVG right coronary artery  . Thoracic ascending aortic aneurysm 2003    Ascending thoracic aneurysm repair with a graft at time of CABG  . Diabetes mellitus     Insulin dependent  . Gastroparesis   . Depression   . Allergy history, radiographic dye     Contrast dye allergy  . Previous back surgery   . Syncope 2008    Question?  2008  . Chronic systolic heart failure   . Ischemic cardiomyopathy 06/2011    a. Echo 04/19/12: EF 25-30%, mod LVH, diffuse HK, trivial AI  . Hypotension     October, 2013  . Hypothyroidism     Treated with low-dose Synthroid  . CKD (chronic kidney disease)   . Carotid artery disease     a. 80-99% prox RICA stenosis - with recent cardiac event 06/2012, favor waiting at least 1 month, optimally 3 months post PCI.  [name] Hypertriglyceridemia   . Statin intolerance   . Peripheral neuropathy   . PAF (paroxysmal atrial fibrillation)     a. Noted during 06/2012 hospitalization. Placed on amiodarone. Plan is for event monitor to assess for breakthrough, hold off anticoag unless additional AF seen.  [name] NSVT (nonsustained ventricular tachycardia)     a. Noted during 06/2012 hospitalization.  . Plavix Non-responder  a. Also allergic to brilinta.   Past Surgical History  Procedure Laterality Date  . Coronary artery bypass graft  05/05/2001     LIMA, LAD, SVG to the OM, SVG right coronary artery  . Bladder tacking    . Salivary gland surgery    . Total abdominal hysterectomy    . Back surgery    . Breast surgery    . External ear surgery    . Cardiac catheterization  04/15/12, 07/05/12    60-70% proximal LAD, 95% mid AV groove circumflex s/p PTCA, proximal RCA occlusion, patent LIMA to LAD, patent SVG to distal RCA, chronically occluded SVG to circumflex; LVEF 25-30%  . Ercp N/A 11/04/2012    Procedure: ENDOSCOPIC RETROGRADE CHOLANGIOPANCREATOGRAPHY (ERCP);  Surgeon: Petra Kuba, MD;   Location: [facility] OR;  Service: Endoscopy;  Laterality: N/A;  . Cataract extraction w/phaco Right 05/31/2013    Procedure: CATARACT EXTRACTION PHACO AND INTRAOCULAR LENS PLACEMENT (IOC);  Surgeon: Loraine Leriche T. Nile Riggs, MD;  Location: [facility];  Service: Ophthalmology;  Laterality: Right;  CDE: 26.18   . Cataract extraction w/phaco Left 06/14/2013    Procedure: CATARACT EXTRACTION PHACO AND INTRAOCULAR LENS PLACEMENT (IOC);  Surgeon: Loraine Leriche T. Nile Riggs, MD;  Location: [facility];  Service: Ophthalmology;  Laterality: Left;  CDE:11.35   Family History  Problem Relation Age of Onset  . Heart disease Other   . Heart disease Mother   . Hypertension Mother   . Heart disease Father   . Hypertension Father    History  Substance Use Topics  . Smoking status: Never Smoker   . Smokeless tobacco: Never Used  . Alcohol Use: 1.5 oz/week    3 drink(s) per week   OB History   Grav Para Term Preterm Abortions TAB SAB Ect Mult Living                 Review of Systems  Constitutional: Negative for appetite change and fatigue.  HENT: Negative for congestion, ear discharge and sinus pressure.   Eyes: Negative for discharge.  Respiratory: Negative for cough.   Cardiovascular: Negative for chest pain.  Gastrointestinal: Negative for abdominal pain and diarrhea.  Genitourinary: Negative for frequency and hematuria.  Musculoskeletal: Negative for back pain.  Skin: Positive for rash.  Neurological: Negative for seizures and headaches.  Psychiatric/Behavioral: Negative for hallucinations.      Allergies  Brilinta; Iohexol; Amiodarone; Atorvastatin; Citrus; Contrast media; Plavix; Zolpidem tartrate; and Milk-related compounds  Home Medications   Prior to Admission medications   Medication Sig Start Date End Date Taking? Authorizing Provider  acetaminophen (TYLENOL) 500 MG tablet Take 1,000 mg by mouth every 6 (six) hours as needed for pain.     Historical Provider, MD  amiodarone (PACERONE) 200 MG tablet Take 200  mg by mouth daily. 03/24/13   Historical Provider, MD  aspirin EC 81 MG tablet Take 81 mg by mouth every morning. 04/28/12   Rande Brunt, PA-C  carvedilol (COREG) 25 MG tablet Take 1 tablet (25 mg total) by mouth 2 (two) times daily. 03/21/13   Salley Scarlet, MD  cetirizine (ZYRTEC) 10 MG tablet Take 10 mg by mouth daily.    Historical Provider, MD  ciprofloxacin (CIPRO) 500 MG tablet Take 1 tablet (500 mg total) by mouth 2 (two) times daily. 07/01/13   Salley Scarlet, MD  ezetimibe (ZETIA) 10 MG tablet Take 10 mg by mouth daily. 05/04/13   Salley Scarlet, MD  fluticasone (FLONASE) 50 MCG/ACT nasal spray Place  1 spray into both nostrils daily as needed for allergies. 03/21/13   Salley ScarletKawanta F [place], MD  folic acid (FOLVITE) 400 MCG tablet Take 400 mcg by mouth daily.    Historical Provider, MD  furosemide (LASIX) 40 MG tablet TAKE ONE TABLET BY MOUTH ONCE DAILY. 06/24/13   Salley ScarletKawanta F [place], MD  insulin glargine (LANTUS) 100 UNIT/ML injection Inject 0.6 mLs (60 Units total) into the skin at bedtime. 06/06/13   Salley ScarletKawanta F [place], MD  insulin lispro (HUMALOG) 100 UNIT/ML injection Sliding scale as before admission to the hospital. 06/08/13   Salley ScarletKawanta F [place], MD  isosorbide mononitrate (IMDUR) 30 MG 24 hr tablet Take 1 tablet (30 mg total) by mouth every morning. 03/21/13   Salley ScarletKawanta F [place], MD  levothyroxine (SYNTHROID, LEVOTHROID) 25 MCG tablet Take 1 tablet (25 mcg total) by mouth daily before breakfast. 03/21/13   Salley ScarletKawanta F [place], MD  meclizine (ANTIVERT) 25 MG tablet Take 1 tablet (25 mg total) by mouth 2 (two) times daily as needed for dizziness. 07/01/13   Salley ScarletKawanta F [place], MD  nitroGLYCERIN (NITROSTAT) 0.4 MG SL tablet Place 1 tablet (0.4 mg total) under the tongue every 5 (five) minutes as needed for chest pain. May repeat times three. 03/21/13   Salley ScarletKawanta F [place], MD  ondansetron (ZOFRAN) 4 MG tablet Take 1 tablet (4 mg total) by mouth every 6 (six) hours as needed for nausea or vomiting. 05/17/13   Salley ScarletKawanta F  [place], MD  ramipril (ALTACE) 2.5 MG capsule Take 1 capsule (2.5 mg total) by mouth daily. 03/21/13   Salley ScarletKawanta F [place], MD  tamsulosin (FLOMAX) 0.4 MG CAPS capsule Take 0.4 mg by mouth daily. 03/29/13   Historical Provider, MD   Triage Vitals: BP 182/75  Pulse 68  Temp(Src) 98 F (36.7 C) (Oral)  Ht 5\' 5"  (1.651 m)  Wt 200 lb (90.719 kg)  BMI 33.28 kg/m2  SpO2 96% Physical Exam  Constitutional: She is oriented to person, place, and time. She appears well-developed.  HENT:  Head: Normocephalic.  Eyes: Conjunctivae and EOM are normal. No scleral icterus.  Stye in left lower eye lid.   Neck: Neck supple. No thyromegaly present.  Minimal tenderness to her left anterior lymph nodes.   Cardiovascular: Normal rate and regular rhythm.  Exam reveals no gallop and no friction rub.   No murmur heard. Pulmonary/Chest: No stridor. She has no wheezes. She has no rales. She exhibits no tenderness.  Abdominal: She exhibits no distension. There is no tenderness. There is no rebound.  Musculoskeletal: Normal range of motion. She exhibits no edema.  Lymphadenopathy:    She has no cervical adenopathy.  Neurological: She is oriented to person, place, and time. She exhibits normal muscle tone. Coordination normal.  Skin: Rash noted. No erythema.  Minor rash to her upper extremities.   Psychiatric: She has a normal mood and affect. Her behavior is normal.    ED Course  Procedures (including critical care time) DIAGNOSTIC STUDIES: Oxygen Saturation is 96% on room air, adequate by my interpretation.    COORDINATION OF CARE: 11:46 AM- Will order Benadryl and methylprednisolone. Patient informed of current plan for treatment and evaluation and agrees with plan at this time.     Labs Review Labs Reviewed  BASIC METABOLIC PANEL - Abnormal; Notable for the following:    Glucose, Bld 269 (*)    GFR calc non Af Amer 77 (*)    GFR calc Af Amer 89 (*)    All other components within  normal limits  CBC  WITH DIFFERENTIAL   MDM   Final diagnoses:  None    Allergic reaction and stye,   Atarax and tobramycin eye drops   The chart was scribed for me under my direct supervision.  I personally performed the history, physical, and medical decision making and all procedures in the evaluation of this patient.Benny Lennert.   Joseph L Zammit, MD 07/15/13 1425

## 2013-07-19 ENCOUNTER — Other Ambulatory Visit: Payer: Self-pay | Admitting: Family Medicine

## 2013-07-20 ENCOUNTER — Other Ambulatory Visit: Payer: Self-pay | Admitting: Family Medicine

## 2013-07-25 ENCOUNTER — Encounter: Payer: Self-pay | Admitting: Cardiology

## 2013-07-25 ENCOUNTER — Ambulatory Visit (INDEPENDENT_AMBULATORY_CARE_PROVIDER_SITE_OTHER): Payer: Commercial Managed Care - HMO | Admitting: Cardiology

## 2013-07-25 VITALS — BP 84/49 | HR 69 | Ht 65.0 in | Wt 199.0 lb

## 2013-07-25 DIAGNOSIS — I4891 Unspecified atrial fibrillation: Secondary | ICD-10-CM

## 2013-07-25 DIAGNOSIS — Z9229 Personal history of other drug therapy: Secondary | ICD-10-CM

## 2013-07-25 DIAGNOSIS — I480 Paroxysmal atrial fibrillation: Secondary | ICD-10-CM

## 2013-07-25 DIAGNOSIS — I959 Hypotension, unspecified: Secondary | ICD-10-CM | POA: Insufficient documentation

## 2013-07-25 DIAGNOSIS — I5022 Chronic systolic (congestive) heart failure: Secondary | ICD-10-CM

## 2013-07-25 DIAGNOSIS — I509 Heart failure, unspecified: Secondary | ICD-10-CM

## 2013-07-25 DIAGNOSIS — R21 Rash and other nonspecific skin eruption: Secondary | ICD-10-CM

## 2013-07-25 MED ORDER — CARVEDILOL 12.5 MG PO TABS: 12.5000 mg | ORAL_TABLET | Freq: Two times a day (BID) | ORAL | Status: AC

## 2013-07-25 NOTE — Progress Notes (Signed)
Patient ID: Cathy [name], female   DOB: [birth year], 80 y.o.   MRN: [id number]    HPI  Patient is here today for cardiology followup. I saw her last March 04, 2013. She was relatively stable on that day area there is an extensive note from that time. She was then hospitalized with a discharge date of March 15, 2013. At that time she did undergo cardiac catheterization. Her coronaries were stable. Recommendation was to continue her medical therapy. At that time she was discharged on amiodarone. She was seen in the emergency room on April 17, 2013. There was the possibility of a syncopal episode. She received IV fluids and was sent home.  Most recently she was seen in the emergency room with a rash. Many medications were stopped including her amiodarone.  Today she feels nauseated. It is noted also that her blood pressure is quite low.  Allergies  Allergen Reactions  . Amiodarone Shortness Of Breath  . Brilinta [Ticagrelor] Shortness Of Breath and Nausea And Vomiting  . Iohexol Anaphylaxis       . Atorvastatin Other (See Comments)    Unknown-patient admitted to hospital after taking  . Citrus Dermatitis    BREAK OUT ON BODY  . Contrast Media [Iodinated Diagnostic Agents] Other (See Comments)    Passed out  . Plavix [Clopidogrel Bisulfate]     Not an allergy - but PRU was drawn 06/2012 indicating Plavix hyporesponsiveness, thus she was changed to Brilinta.  . Zolpidem Tartrate Other (See Comments)    Hallucinations  . Milk-Related Compounds Rash    Current Outpatient Prescriptions  Medication Sig Dispense Refill  . acetaminophen (TYLENOL) 500 MG tablet Take 1,000 mg by mouth every 6 (six) hours as needed for pain.       [name] aspirin EC 81 MG tablet Take 81 mg by mouth every morning.      . carvedilol (COREG) 25 MG tablet Take 1 tablet (25 mg total) by mouth 2 (two) times daily.  60 tablet  6  . cetirizine (ZYRTEC) 10 MG tablet Take 10 mg by mouth daily.      . fluticasone (FLONASE)  50 MCG/ACT nasal spray Place 1 spray into both nostrils daily as needed for allergies.  16 g  3  . folic acid (FOLVITE) 400 MCG tablet Take 400 mcg by mouth daily.      . furosemide (LASIX) 40 MG tablet TAKE ONE TABLET BY MOUTH ONCE DAILY.  30 tablet  3  . hydrOXYzine (ATARAX/VISTARIL) 25 MG tablet Take one every 6 hours for itching  20 tablet  0  . insulin glargine (LANTUS) 100 UNIT/ML injection Inject 0.6 mLs (60 Units total) into the skin at bedtime.  18 mL  3  . insulin lispro (HUMALOG) 100 UNIT/ML injection Sliding scale as before admission to the hospital.  30 mL  3  . isosorbide mononitrate (IMDUR) 30 MG 24 hr tablet Take 1 tablet (30 mg total) by mouth every morning.  30 tablet  3  . levothyroxine (SYNTHROID, LEVOTHROID) 25 MCG tablet Take 1 tablet (25 mcg total) by mouth daily before breakfast.  30 tablet  3  . meclizine (ANTIVERT) 25 MG tablet Take 1 tablet (25 mg total) by mouth 2 (two) times daily as needed for dizziness.  30 tablet  1  . nitroGLYCERIN (NITROSTAT) 0.4 MG SL tablet Place 1 tablet (0.4 mg total) under the tongue every 5 (five) minutes as needed for chest pain. May repeat times three.  30 tablet  3  . ondansetron (ZOFRAN) 4 MG tablet TAKE ONE TABLET BY MOUTH EVERY 6 HOURS AS NEEDED FOR NAUSEA AND VOMITING.  20 tablet  0  . ramipril (ALTACE) 2.5 MG capsule Take 1 capsule (2.5 mg total) by mouth daily.  30 capsule  6  . tobramycin (TOBREX) 0.3 % ophthalmic solution Place 1 drop into the left eye every 4 (four) hours.  5 mL  0   No current facility-administered medications for this visit.    History   Social History  . Marital Status: Divorced    Spouse Name: N/A    Number of Children: N/A  . Years of Education: N/A   Occupational History  . Retired    Social History Main Topics  . Smoking status: Never Smoker   . Smokeless tobacco: Never Used  . Alcohol Use: 1.5 oz/week    3 drink(s) per week  . Drug Use: No  . Sexual Activity: Not Currently   Other Topics  Concern  . Not on file   Social History Narrative  . No narrative on file    Family History  Problem Relation Age of Onset  . Heart disease Other   . Heart disease Mother   . Hypertension Mother   . Heart disease Father   . Hypertension Father     Past Medical History  Diagnosis Date  . Hypertension     Unspecified  . Hyperlipidemia     Mixed, statin intolerance.  [name]. CAD (coronary artery disease)     a. CAD s/p CABG 2003. b. Cath 2006: occluded OM vein graft. c. Anterior MI 04/15/12 s/p PTCA only to ramus (stenting unsuccessful at that time) d. NSTEMI 06/2012: failed med rx, s/p DES to ramus c/b vessel perforation tx with balloon/stenting; e. 02/1013 Cath: LM nl, LAD mod dzs mid, LIMA ok, LCX 1764m, OM1 patent stent, VG->OM 100, RCA 100p, VG->PDA ok, EF 40-45%->Med Rx.  . S/P CABG (coronary artery bypass graft) 2003     2003  LIMA, LAD, SVG to the OM, SVG right coronary artery  . Thoracic ascending aortic aneurysm 2003    Ascending thoracic aneurysm repair with a graft at time of CABG  . Diabetes mellitus     Insulin dependent  . Gastroparesis   . Depression   . Allergy history, radiographic dye     Contrast dye allergy  . Previous back surgery   . Syncope 2008    Question?  2008  . Chronic systolic heart failure   . Ischemic cardiomyopathy 06/2011    a. Echo 04/19/12: EF 25-30%, mod LVH, diffuse HK, trivial AI  . Hypotension     October, 2013  . Hypothyroidism     Treated with low-dose Synthroid  . CKD (chronic kidney disease)   . Carotid artery disease     a. 80-99% prox RICA stenosis - with recent cardiac event 06/2012, favor waiting at least 1 month, optimally 3 months post PCI.  [name]. Hypertriglyceridemia   . Statin intolerance   . Peripheral neuropathy   . PAF (paroxysmal atrial fibrillation)     a. Noted during 06/2012 hospitalization. Placed on amiodarone. Plan is for event monitor to assess for breakthrough, hold off anticoag unless additional AF seen.  [name]. NSVT  (nonsustained ventricular tachycardia)     a. Noted during 06/2012 hospitalization.  . Plavix Non-responder     a. Also allergic to brilinta.    Past Surgical History  Procedure Laterality Date  . Coronary artery bypass graft  05/05/2001     LIMA, LAD, SVG to the OM, SVG right coronary artery  . Bladder tacking    . Salivary gland surgery    . Total abdominal hysterectomy    . Back surgery    . Breast surgery    . External ear surgery    . Cardiac catheterization  04/15/12, 07/05/12    60-70% proximal LAD, 95% mid AV groove circumflex s/p PTCA, proximal RCA occlusion, patent LIMA to LAD, patent SVG to distal RCA, chronically occluded SVG to circumflex; LVEF 25-30%  . Ercp N/A 11/04/2012    Procedure: ENDOSCOPIC RETROGRADE CHOLANGIOPANCREATOGRAPHY (ERCP);  Surgeon: Petra KubaMarc E Magod, MD;  [facility];  Service: Endoscopy;  Laterality: N/A;  . Cataract extraction w/phaco Right 05/31/2013    Procedure: CATARACT EXTRACTION PHACO AND INTRAOCULAR LENS PLACEMENT (IOC);  Surgeon: Loraine LericheMark T. Nile RiggsShapiro, MD;  Location: [facility];  Service: Ophthalmology;  Laterality: Right;  CDE: 26.18   . Cataract extraction w/phaco Left 06/14/2013    Procedure: CATARACT EXTRACTION PHACO AND INTRAOCULAR LENS PLACEMENT (IOC);  Surgeon: Loraine LericheMark T. Nile RiggsShapiro, MD;  Location: [facility];  Service: Ophthalmology;  Laterality: Left;  CDE:11.35    Patient Active Problem List   Diagnosis Date Noted  . Hypertriglyceridemia 03/14/2013  . Chest pain 03/12/2013  . Urinary incontinence 11/10/2012  . Common bile duct (CBD) obstruction 11/06/2012  . Fatigue 11/06/2012  . Cholelithiasis 11/02/2012  . UTI (urinary tract infection) 11/02/2012  . H/O amiodarone therapy 11/02/2012  . Atrial fibrillation   . NSVT (nonsustained ventricular tachycardia)   . Chronic systolic CHF (congestive heart failure) 05/23/2012  . CKD (chronic kidney disease)   . Carotid artery disease   . Right carotid bruit 04/28/2012  . Hypothyroidism   . Neck pain  10/24/2011  . 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 Back pain 08/21/2011  . Diabetic neuropathy 08/21/2011  . Ischemic cardiomyopathy 06/21/2011  . Shortness of breath   . Hypertension   . Hyperlipidemia   . S/P CABG (coronary artery bypass graft)   . Aneurysm   . Depression   . Allergy history, radiographic dye   . Syncope   . Statin intolerance   . CAD (coronary artery disease) 08/21/2009    ROS   Patient denies fever, chills, headache, sweats. She has a rash that is improving. She denies change in vision, change in hearing, chest pain, urinary symptoms. All other systems are reviewed and are negative.  PHYSICAL EXAM  She says that she feels poorly. She is oriented to person time and place. Affect is normal for her. Head is atraumatic. Sclera and conjunctiva are normal. There is no jugular venous distention. Lungs reveal scattered rhonchi. Cardiac exam reveals S1 and S2. The abdomen is soft. There is no significant peripheral edema.  Filed Vitals:   07/25/13 1535  BP: 84/49  Pulse: 69  Height: 5\' 5"  (1.651 m)  Weight: 199 lb (90.266 kg)     ASSESSMENT & PLAN Patient

## 2013-07-25 NOTE — Assessment & Plan Note (Signed)
She has been holding normal sinus rhythm. I am hopeful that this will continue.  As part of today's evaluation I spent greater than 25 minutes with her total care. More than half of this time is been with direct contact with her reviewing all of her hospital visits and emergency room visits and the plan for her care.

## 2013-07-25 NOTE — Assessment & Plan Note (Signed)
Today we will stop her Altace and her Imdur were and lower the carvedilol dose to 12.5 twice a day

## 2013-07-25 NOTE — Patient Instructions (Signed)
Your physician recommends that you schedule a follow-up appointment in: 3 months. You will receive a reminder letter in the mail in about 1-2 months reminding you to call and schedule your appointment. If you don't receive this letter, please contact our office. Your physician has recommended you make the following change in your medication:  Stop ramipril. Stop isosorbide mononitrate. Decrease your carvedilol to 12.5 mg twice daily. Continue all other medications the same.

## 2013-07-25 NOTE — Assessment & Plan Note (Signed)
Etiology of her rash is not clear to me. It is thought to possibly have been related to some of her eyedrops from her surgery. This is being managed by other physicians.

## 2013-07-25 NOTE — Assessment & Plan Note (Signed)
The patient stopped amiodarone in April, 2015. Now that she is off this, her thyroid functions need to be rechecked.

## 2013-07-25 NOTE — Assessment & Plan Note (Signed)
Coronary disease is stable at this time. Her most recent cath was in February, 2015. Fortunately her coronaries were stable. Medical therapy was recommended.

## 2013-07-25 NOTE — Assessment & Plan Note (Signed)
Her volume status is stable. No change in therapy. 

## 2013-07-27 ENCOUNTER — Emergency Department (HOSPITAL_COMMUNITY): Payer: Medicare HMO

## 2013-07-27 ENCOUNTER — Inpatient Hospital Stay (HOSPITAL_COMMUNITY)
Admission: EM | Admit: 2013-07-27 | Discharge: 2013-07-28 | DRG: 445 | Disposition: A | Discharge status: Home Health | Payer: Medicare HMO | Attending: Family Medicine | Admitting: Family Medicine

## 2013-07-27 ENCOUNTER — Encounter (HOSPITAL_COMMUNITY): Payer: Self-pay | Admitting: Emergency Medicine

## 2013-07-27 DIAGNOSIS — E782 Mixed hyperlipidemia: Secondary | ICD-10-CM | POA: Diagnosis present

## 2013-07-27 DIAGNOSIS — Z90710 Acquired absence of both cervix and uterus: Secondary | ICD-10-CM

## 2013-07-27 DIAGNOSIS — I2510 Atherosclerotic heart disease of native coronary artery without angina pectoris: Secondary | ICD-10-CM

## 2013-07-27 DIAGNOSIS — R110 Nausea: Secondary | ICD-10-CM

## 2013-07-27 DIAGNOSIS — K8020 Calculus of gallbladder without cholecystitis without obstruction: Secondary | ICD-10-CM

## 2013-07-27 DIAGNOSIS — N189 Chronic kidney disease, unspecified: Secondary | ICD-10-CM | POA: Diagnosis present

## 2013-07-27 DIAGNOSIS — Z789 Other specified health status: Secondary | ICD-10-CM | POA: Diagnosis present

## 2013-07-27 DIAGNOSIS — Z8249 Family history of ischemic heart disease and other diseases of the circulatory system: Secondary | ICD-10-CM

## 2013-07-27 DIAGNOSIS — I255 Ischemic cardiomyopathy: Secondary | ICD-10-CM | POA: Diagnosis present

## 2013-07-27 DIAGNOSIS — Z951 Presence of aortocoronary bypass graft: Secondary | ICD-10-CM

## 2013-07-27 DIAGNOSIS — K831 Obstruction of bile duct: Secondary | ICD-10-CM

## 2013-07-27 DIAGNOSIS — K3184 Gastroparesis: Secondary | ICD-10-CM | POA: Diagnosis present

## 2013-07-27 DIAGNOSIS — Z794 Long term (current) use of insulin: Secondary | ICD-10-CM

## 2013-07-27 DIAGNOSIS — I10 Essential (primary) hypertension: Secondary | ICD-10-CM | POA: Diagnosis present

## 2013-07-27 DIAGNOSIS — I4891 Unspecified atrial fibrillation: Secondary | ICD-10-CM | POA: Diagnosis present

## 2013-07-27 DIAGNOSIS — R29818 Other symptoms and signs involving the nervous system: Secondary | ICD-10-CM | POA: Diagnosis present

## 2013-07-27 DIAGNOSIS — E785 Hyperlipidemia, unspecified: Secondary | ICD-10-CM | POA: Diagnosis present

## 2013-07-27 DIAGNOSIS — K8050 Calculus of bile duct without cholangitis or cholecystitis without obstruction: Secondary | ICD-10-CM

## 2013-07-27 DIAGNOSIS — Z91041 Radiographic dye allergy status: Secondary | ICD-10-CM

## 2013-07-27 DIAGNOSIS — R1011 Right upper quadrant pain: Secondary | ICD-10-CM

## 2013-07-27 DIAGNOSIS — I129 Hypertensive chronic kidney disease with stage 1 through stage 4 chronic kidney disease, or unspecified chronic kidney disease: Secondary | ICD-10-CM | POA: Diagnosis present

## 2013-07-27 DIAGNOSIS — G609 Hereditary and idiopathic neuropathy, unspecified: Secondary | ICD-10-CM | POA: Diagnosis present

## 2013-07-27 DIAGNOSIS — K8071 Calculus of gallbladder and bile duct without cholecystitis with obstruction: Principal | ICD-10-CM | POA: Diagnosis present

## 2013-07-27 DIAGNOSIS — N390 Urinary tract infection, site not specified: Secondary | ICD-10-CM | POA: Diagnosis present

## 2013-07-27 DIAGNOSIS — I509 Heart failure, unspecified: Secondary | ICD-10-CM

## 2013-07-27 DIAGNOSIS — I252 Old myocardial infarction: Secondary | ICD-10-CM

## 2013-07-27 DIAGNOSIS — I2589 Other forms of chronic ischemic heart disease: Secondary | ICD-10-CM | POA: Diagnosis present

## 2013-07-27 DIAGNOSIS — E039 Hypothyroidism, unspecified: Secondary | ICD-10-CM | POA: Diagnosis present

## 2013-07-27 DIAGNOSIS — N182 Chronic kidney disease, stage 2 (mild): Secondary | ICD-10-CM

## 2013-07-27 DIAGNOSIS — E1169 Type 2 diabetes mellitus with other specified complication: Secondary | ICD-10-CM | POA: Diagnosis present

## 2013-07-27 DIAGNOSIS — I5022 Chronic systolic (congestive) heart failure: Secondary | ICD-10-CM

## 2013-07-27 DIAGNOSIS — Z9861 Coronary angioplasty status: Secondary | ICD-10-CM

## 2013-07-27 DIAGNOSIS — E1149 Type 2 diabetes mellitus with other diabetic neurological complication: Secondary | ICD-10-CM

## 2013-07-27 DIAGNOSIS — E781 Pure hyperglyceridemia: Secondary | ICD-10-CM | POA: Diagnosis present

## 2013-07-27 LAB — CBC WITH DIFFERENTIAL/PLATELET
Basophils Absolute: 0 10*3/uL (ref 0.0–0.1)
Basophils Relative: 0 % (ref 0–1)
Eosinophils Absolute: 0 10*3/uL (ref 0.0–0.7)
Eosinophils Relative: 0 % (ref 0–5)
HEMATOCRIT: 45.5 % (ref 36.0–46.0)
Hemoglobin: 14.9 g/dL (ref 12.0–15.0)
LYMPHS ABS: 1.3 10*3/uL (ref 0.7–4.0)
LYMPHS PCT: 14 % (ref 12–46)
MCH: 29.8 pg (ref 26.0–34.0)
MCHC: 32.7 g/dL (ref 30.0–36.0)
MCV: 91 fL (ref 78.0–100.0)
MONO ABS: 0.8 10*3/uL (ref 0.1–1.0)
Monocytes Relative: 9 % (ref 3–12)
NEUTROS PCT: 77 % (ref 43–77)
Neutro Abs: 7.3 10*3/uL (ref 1.7–7.7)
Platelets: 193 10*3/uL (ref 150–400)
RBC: 5 MIL/uL (ref 3.87–5.11)
RDW: 14.1 % (ref 11.5–15.5)
WBC: 9.4 10*3/uL (ref 4.0–10.5)

## 2013-07-27 LAB — URINALYSIS, ROUTINE W REFLEX MICROSCOPIC
Bilirubin Urine: NEGATIVE
KETONES UR: NEGATIVE mg/dL
LEUKOCYTES UA: NEGATIVE
Nitrite: NEGATIVE
Protein, ur: 30 mg/dL — AB
Specific Gravity, Urine: 1.02 (ref 1.005–1.030)
Urobilinogen, UA: 0.2 mg/dL (ref 0.0–1.0)
pH: 5.5 (ref 5.0–8.0)

## 2013-07-27 LAB — COMPREHENSIVE METABOLIC PANEL
ALK PHOS: 137 U/L — AB (ref 39–117)
ALT: 45 U/L — ABNORMAL HIGH (ref 0–35)
ANION GAP: 13 (ref 5–15)
AST: 18 U/L (ref 0–37)
Albumin: 3.1 g/dL — ABNORMAL LOW (ref 3.5–5.2)
BILIRUBIN TOTAL: 0.6 mg/dL (ref 0.3–1.2)
BUN: 10 mg/dL (ref 6–23)
CO2: 28 meq/L (ref 19–32)
Calcium: 9.7 mg/dL (ref 8.4–10.5)
Chloride: 98 mEq/L (ref 96–112)
Creatinine, Ser: 0.81 mg/dL (ref 0.50–1.10)
GFR calc Af Amer: 77 mL/min — ABNORMAL LOW (ref >90)
GFR, EST NON AFRICAN AMERICAN: 66 mL/min — AB (ref >90)
Glucose, Bld: 315 mg/dL — ABNORMAL HIGH (ref 70–99)
POTASSIUM: 4.6 meq/L (ref 3.7–5.3)
Sodium: 139 mEq/L (ref 137–147)
Total Protein: 7.1 g/dL (ref 6.0–8.3)

## 2013-07-27 LAB — URINE MICROSCOPIC-ADD ON

## 2013-07-27 LAB — LIPASE, BLOOD: Lipase: 15 U/L (ref 11–59)

## 2013-07-27 LAB — LACTIC ACID, PLASMA: Lactic Acid, Venous: 2.1 mmol/L (ref 0.5–2.2)

## 2013-07-27 MED ORDER — METOCLOPRAMIDE HCL 5 MG/ML IJ SOLN
2.5000 mg | Freq: Once | INTRAMUSCULAR | Status: AC
  Administered 2013-07-27: 2.5 mg via INTRAVENOUS
  Filled 2013-07-27: qty 2

## 2013-07-27 MED ORDER — DIPHENHYDRAMINE HCL 50 MG/ML IJ SOLN
12.5000 mg | Freq: Once | INTRAMUSCULAR | Status: AC
  Administered 2013-07-27: 12.5 mg via INTRAVENOUS
  Filled 2013-07-27: qty 1

## 2013-07-27 MED ORDER — MORPHINE SULFATE 4 MG/ML IJ SOLN
4.0000 mg | Freq: Once | INTRAMUSCULAR | Status: AC
  Administered 2013-07-27: 4 mg via INTRAVENOUS
  Filled 2013-07-27: qty 1

## 2013-07-27 MED ORDER — ONDANSETRON HCL 4 MG/2ML IJ SOLN
4.0000 mg | Freq: Once | INTRAMUSCULAR | Status: AC
  Administered 2013-07-27: 4 mg via INTRAVENOUS
  Filled 2013-07-27: qty 2

## 2013-07-27 MED ORDER — SODIUM CHLORIDE 0.9 % IV SOLN
INTRAVENOUS | Status: DC
  Administered 2013-07-27 – 2013-07-28 (×2): via INTRAVENOUS

## 2013-07-27 MED ORDER — DEXTROSE 5 % IV SOLN
1.0000 g | INTRAVENOUS | Status: DC
  Administered 2013-07-27: 1 g via INTRAVENOUS
  Filled 2013-07-27: qty 10

## 2013-07-27 NOTE — ED Notes (Signed)
MD at bedside. 

## 2013-07-27 NOTE — ED Provider Notes (Signed)
CSN: [id number]     Arrival date & time 07/27/13  1615 History   First MD Initiated Contact with Patient 07/27/13 1617     Chief Complaint  Patient presents with  . Abdominal Pain     (Consider location/radiation/quality/duration/timing/severity/associated sxs/prior Treatment) HPI Comments: Patient here complaining of mid epigastric and right upper quadrant pain since this morning when she awoke. Does have a history of cholelithiasis. Has had myalgias but no fever at home. She's had nausea but no vomiting or diarrhea. Denies any anginal type chest pain. Pain characterized as colicky. No treatment used prior to arrival. Nothing makes her symptoms better or worse. Denies any urinary symptoms. Does have a history of choledocholithiasis and has had ERCP  Patient is a 80 y.o. female presenting with abdominal pain. The history is provided by the patient.  Abdominal Pain   Past Medical History  Diagnosis Date  . Hypertension     Unspecified  . Hyperlipidemia     Mixed, statin intolerance.  [name] CAD (coronary artery disease)     a. CAD s/p CABG 2003. b. Cath 2006: occluded OM vein graft. c. Anterior MI 04/15/12 s/p PTCA only to ramus (stenting unsuccessful at that time) d. NSTEMI 06/2012: failed med rx, s/p DES to ramus c/b vessel perforation tx with balloon/stenting; e. 02/1013 Cath: LM nl, LAD mod dzs mid, LIMA ok, LCX 136m, OM1 patent stent, VG->OM 100, RCA 100p, VG->PDA ok, EF 40-45%->Med Rx.  . S/P CABG (coronary artery bypass graft) 2003     2003  LIMA, LAD, SVG to the OM, SVG right coronary artery  . Thoracic ascending aortic aneurysm 2003    Ascending thoracic aneurysm repair with a graft at time of CABG  . Diabetes mellitus     Insulin dependent  . Gastroparesis   . Depression   . Allergy history, radiographic dye     Contrast dye allergy  . Previous back surgery   . Syncope 2008    Question?  2008  . Chronic systolic heart failure   . Ischemic cardiomyopathy 06/2011    a. Echo  04/19/12: EF 25-30%, mod LVH, diffuse HK, trivial AI  . Hypotension     October, 2013  . Hypothyroidism     Treated with low-dose Synthroid  . CKD (chronic kidney disease)   . Carotid artery disease     a. 80-99% prox RICA stenosis - with recent cardiac event 06/2012, favor waiting at least 1 month, optimally 3 months post PCI.  [name] Hypertriglyceridemia   . Statin intolerance   . Peripheral neuropathy   . PAF (paroxysmal atrial fibrillation)     a. Noted during 06/2012 hospitalization. Placed on amiodarone. Plan is for event monitor to assess for breakthrough, hold off anticoag unless additional AF seen.  [name] NSVT (nonsustained ventricular tachycardia)     a. Noted during 06/2012 hospitalization.  . Plavix Non-responder     a. Also allergic to brilinta.   Past Surgical History  Procedure Laterality Date  . Coronary artery bypass graft  05/05/2001     LIMA, LAD, SVG to the OM, SVG right coronary artery  . Bladder tacking    . Salivary gland surgery    . Total abdominal hysterectomy    . Back surgery    . Breast surgery    . External ear surgery    . Cardiac catheterization  04/15/12, 07/05/12    60-70% proximal LAD, 95% mid AV groove circumflex s/p PTCA, proximal RCA occlusion, patent LIMA to LAD, patent  SVG to distal RCA, chronically occluded SVG to circumflex; LVEF 25-30%  . Ercp N/A 11/04/2012    Procedure: ENDOSCOPIC RETROGRADE CHOLANGIOPANCREATOGRAPHY (ERCP);  Surgeon: Petra KubaMarc E Magod, MD;  [facility];  Service: Endoscopy;  Laterality: N/A;  . Cataract extraction w/phaco Right 05/31/2013    Procedure: CATARACT EXTRACTION PHACO AND INTRAOCULAR LENS PLACEMENT (IOC);  Surgeon: Loraine LericheMark T. Nile RiggsShapiro, MD;  Location: [facility];  Service: Ophthalmology;  Laterality: Right;  CDE: 26.18   . Cataract extraction w/phaco Left 06/14/2013    Procedure: CATARACT EXTRACTION PHACO AND INTRAOCULAR LENS PLACEMENT (IOC);  Surgeon: Loraine LericheMark T. Nile RiggsShapiro, MD;  Location: [facility];  Service: Ophthalmology;  Laterality: Left;   CDE:11.35   Family History  Problem Relation Age of Onset  . Heart disease Other   . Heart disease Mother   . Hypertension Mother   . Heart disease Father   . Hypertension Father    History  Substance Use Topics  . Smoking status: Never Smoker   . Smokeless tobacco: Never Used  . Alcohol Use: 1.5 oz/week    3 drink(s) per week   OB History   Grav Para Term Preterm Abortions TAB SAB Ect Mult Living                 Review of Systems  Gastrointestinal: Positive for abdominal pain.  All other systems reviewed and are negative.     Allergies  Amiodarone; Brilinta; Iohexol; Atorvastatin; Citrus; Contrast media; Plavix; Zolpidem tartrate; and Milk-related compounds  Home Medications   Prior to Admission medications   Medication Sig Start Date End Date Taking? Authorizing Provider  acetaminophen (TYLENOL) 500 MG tablet Take 1,000 mg by mouth every 6 (six) hours as needed for pain.     Historical Provider, MD  aspirin EC 81 MG tablet Take 81 mg by mouth every morning. 04/28/12   Rande BruntEugene C Serpe, PA-C  carvedilol (COREG) 12.5 MG tablet Take 1 tablet (12.5 mg total) by mouth 2 (two) times daily. 07/25/13   Luis AbedJeffrey D Katz, MD  cetirizine (ZYRTEC) 10 MG tablet Take 10 mg by mouth daily.    Historical Provider, MD  fluticasone (FLONASE) 50 MCG/ACT nasal spray Place 1 spray into both nostrils daily as needed for allergies. 03/21/13   Salley ScarletKawanta F Ransom, MD  folic acid (FOLVITE) 400 MCG tablet Take 400 mcg by mouth daily.    Historical Provider, MD  furosemide (LASIX) 40 MG tablet TAKE ONE TABLET BY MOUTH ONCE DAILY. 06/24/13   Salley ScarletKawanta F [place], MD  hydrOXYzine (ATARAX/VISTARIL) 25 MG tablet Take one every 6 hours for itching 07/15/13   Benny LennertJoseph L Zammit, MD  insulin glargine (LANTUS) 100 UNIT/ML injection Inject 0.6 mLs (60 Units total) into the skin at bedtime. 06/06/13   Salley ScarletKawanta F [place], MD  insulin lispro (HUMALOG) 100 UNIT/ML injection Sliding scale as before admission to the hospital. 06/08/13    Salley ScarletKawanta F [place]ichhol[place], MD  levothyroxine (SYNTHROID, LEVOTHROID) 25 MCG tablet Take 1 tablet (25 mcg total) by mouth daily before breakfast. 03/21/13   Salley ScarletKawanta F [place], MD  meclizine (ANTIVERT) 25 MG tablet Take 1 tablet (25 mg total) by mouth 2 (two) times daily as needed for dizziness. 07/01/13   Salley ScarletKawanta F [place], MD  nitroGLYCERIN (NITROSTAT) 0.4 MG SL tablet Place 1 tablet (0.4 mg total) under the tongue every 5 (five) minutes as needed for chest pain. May repeat times three. 03/21/13   Salley ScarletKawanta F [place], MD  ondansetron (ZOFRAN) 4 MG tablet TAKE ONE TABLET BY MOUTH EVERY 6  HOURS AS NEEDED FOR NAUSEA AND VOMITING.    Salley ScarletKawanta F [place], MD  tobramycin (TOBREX) 0.3 % ophthalmic solution Place 1 drop into the left eye every 4 (four) hours. 07/15/13   Benny LennertJoseph L Zammit, MD   BP 163/66  Pulse 85  Temp(Src) 101.6 F (38.7 C) (Oral)  Resp 45  Ht 5\' 5"  (1.651 m)  Wt 198 lb (89.812 kg)  BMI 32.95 kg/m2  SpO2 89% Physical Exam  Nursing note and vitals reviewed. Constitutional: She is oriented to person, place, and time. She appears well-developed and well-nourished.  Non-toxic appearance. No distress.  HENT:  Head: Normocephalic and atraumatic.  Eyes: Conjunctivae, EOM and lids are normal. Pupils are equal, round, and reactive to light.  Neck: Normal range of motion. Neck supple. No tracheal deviation present. No mass present.  Cardiovascular: Normal rate, regular rhythm and normal heart sounds.  Exam reveals no gallop.   No murmur heard. Pulmonary/Chest: Effort normal and breath sounds normal. No stridor. No respiratory distress. She has no decreased breath sounds. She has no wheezes. She has no rhonchi. She has no rales.  Abdominal: Soft. Normal appearance and bowel sounds are normal. She exhibits no distension. There is tenderness in the right upper quadrant and epigastric area. There is no rigidity, no rebound, no guarding and no CVA tenderness.    Musculoskeletal: Normal range of motion. She exhibits  no edema and no tenderness.  Neurological: She is alert and oriented to person, place, and time. She has normal strength. No cranial nerve deficit or sensory deficit. GCS eye subscore is 4. GCS verbal subscore is 5. GCS motor subscore is 6.  Skin: Skin is warm and dry. No abrasion and no rash noted.  Psychiatric: She has a normal mood and affect. Her speech is normal and behavior is normal.    ED Course  Procedures (including critical care time) Labs Review Labs Reviewed  URINE CULTURE  CBC WITH DIFFERENTIAL  COMPREHENSIVE METABOLIC PANEL  LIPASE, BLOOD  URINALYSIS, ROUTINE W REFLEX MICROSCOPIC  I-STAT CG4 LACTIC ACID, ED    Imaging Review No results found.   EKG Interpretation None      MDM   Final diagnoses:  None    Patient given IV fluids and pain meds here. Treated for urinary tract infection with Rocephin. Abdominal ultrasound shows cholelithiasis with a dilated common bile duct. Abdominal CT scan is pending at this time and has been signed out to Dr. Blanchard KelchKnapp    Yashar Inclan T Meka Lewan, MD 07/27/13 2156

## 2013-07-27 NOTE — ED Provider Notes (Signed)
Pt left at change of shift to get results of CT scan of A/P. Pt reports she has been sick all day with abdominal pain and nausea.  Her studies show gallstones and possible CBD stone.   Pt looks like she feels bad. She states her pain is better now after meds.   23:25 Dr Conley RollsLe will see patient.    Ct Abdomen Pelvis Wo Contrast  07/27/2013   CLINICAL DATA:  Left upper quadrant pain and nausea. No IV contrast material due to contrast allergy.  EXAM: CT ABDOMEN AND PELVIS WITHOUT CONTRAST  TECHNIQUE: Multidetector CT imaging of the abdomen and pelvis was performed following the standard protocol without IV contrast.  COMPARISON:  MRI abdomen 11/02/2012, ultrasound 07/27/2013  FINDINGS: Atelectasis in the lung bases.  Cholelithiasis with moderately distended gallbladder. No gallbladder wall thickening. Mild extrahepatic bile duct dilatation. No radiopaque stones are demonstrated in the common duct. Unenhanced appearance of the liver, spleen, pancreas, adrenal glands, inferior vena cava, and retroperitoneal lymph nodes is unremarkable. Focal scarring versus angio Myo lipoma in the right kidney. Probable cyst in the left kidney. No hydronephrosis. Calcification of aorta without aneurysm. The stomach, small bowel, and colon are mostly decompressed. Stool fills the colon. Contrast material throughout most of the small bowel without evidence of obstruction. There appears to have been partial resection of the colon although this is not indicated in the patient's surgical history. Alternatively, this could be due to non rotation of the colon. Surgical clips are demonstrated throughout the abdomen. No free air or free fluid in the abdomen.  Pelvis: Surgical clips in the pelvis with probable prior hysterectomy. No pelvic mass or lymphadenopathy. Appendix is not identified and is likely surgically absent. No free or loculated pelvic fluid collections. Degenerative changes in the lumbar spine. No destructive bone lesions  appreciated. Mild spondylolisthesis at L4 on L5.  IMPRESSION: Cholelithiasis with the moderately distended gallbladder. Mild extrahepatic bile duct dilatation without obstructing stone or mass appreciated.   Electronically Signed   By: Burman NievesWilliam  Stevens M.D.   On: 07/27/2013 22:41   [place] Abdomen Complete  07/27/2013   CLINICAL DATA:  Upper abdominal pain.  EXAM: ULTRASOUND ABDOMEN COMPLETE  COMPARISON:  11/01/2012  FINDINGS: Gallbladder:  Cholelithiasis is again demonstrated, without evidence of gallbladder wall thickening or pericholecystic fluid. No sonographic Murphy sign noted by sonographer.  Common bile duct:  Diameter: 12 mm, compared to 10 mm on prior exam.  Liver:  Diffusely increased echogenicity of the hepatic parenchyma, consistent with diffuse hepatic steatosis/hepatocellular disease. No focal mass lesion identified.  IVC:  No abnormality visualized.  Pancreas:  Visualized portion unremarkable.  Spleen:  Size and appearance within normal limits.  Right Kidney:  Length: 10.8 cm. Echogenicity within normal limits. No mass or hydronephrosis visualized.  Left Kidney:  Length: 12.3 cm. Echogenicity within normal limits. No mass or hydronephrosis visualized.  Abdominal aorta:  No aneurysm visualized.  Other findings:  None.  IMPRESSION: Cholelithiasis. No definite sonographic signs of acute cholecystitis.  Dilated common bile duct, now measuring 12 mm compared with 10 mm previously. Suggest correlation with liver function tests, and consider MRCP for further evaluation if clinically warranted.  Hepatic steatosis.   Electronically Signed   By: Myles RosenthalJohn  Stahl M.D.   On: 07/27/2013 18:59   Dg Chest Port 1 View  07/27/2013   CLINICAL DATA:  Chest pain, weakness.  EXAM: PORTABLE CHEST - 1 VIEW  COMPARISON:  03/12/2013  FINDINGS: Prior CABG. Cardiomegaly with vascular congestion and increasing interstitial  prominence, possibly early interstitial edema. No confluent opacities or effusions. No acute bony abnormality.   IMPRESSION: Cardiomegaly with vascular congestion and possible early interstitial edema.   Electronically Signed   By: Charlett NoseKevin  Dover M.D.   On: 07/27/2013 17:05    Devoria AlbeIva Emmauel Hallums, MD, Franz DellFACEP     Tarus Briski L Heather Mckendree, MD 07/27/13 [phone]

## 2013-07-27 NOTE — ED Notes (Signed)
Patient states that her stomach only hurts when she eats certain foods. States that she is very nauseated at this time. Patient assisted to restroom to obtain urine sample.

## 2013-07-27 NOTE — ED Notes (Signed)
Pt states upper abdominal pain since waking up this morning. Pt also states nausea. Denies V/D

## 2013-07-28 LAB — COMPREHENSIVE METABOLIC PANEL
ALT: 31 U/L (ref 0–35)
ANION GAP: 10 (ref 5–15)
AST: 13 U/L (ref 0–37)
Albumin: 2.6 g/dL — ABNORMAL LOW (ref 3.5–5.2)
Alkaline Phosphatase: 110 U/L (ref 39–117)
BILIRUBIN TOTAL: 0.4 mg/dL (ref 0.3–1.2)
BUN: 10 mg/dL (ref 6–23)
CHLORIDE: 101 meq/L (ref 96–112)
CO2: 28 mEq/L (ref 19–32)
Calcium: 8.9 mg/dL (ref 8.4–10.5)
Creatinine, Ser: 0.84 mg/dL (ref 0.50–1.10)
GFR calc non Af Amer: 63 mL/min — ABNORMAL LOW (ref >90)
GFR, EST AFRICAN AMERICAN: 74 mL/min — AB (ref >90)
GLUCOSE: 250 mg/dL — AB (ref 70–99)
Potassium: 3.7 mEq/L (ref 3.7–5.3)
Sodium: 139 mEq/L (ref 137–147)
Total Protein: 6.2 g/dL (ref 6.0–8.3)

## 2013-07-28 LAB — GLUCOSE, CAPILLARY
GLUCOSE-CAPILLARY: 291 mg/dL — AB (ref 70–99)
Glucose-Capillary: 229 mg/dL — ABNORMAL HIGH (ref 70–99)
Glucose-Capillary: 199 mg/dL — ABNORMAL HIGH (ref 70–99)
Glucose-Capillary: 311 mg/dL — ABNORMAL HIGH (ref 70–99)

## 2013-07-28 LAB — CBC
HCT: 41.2 % (ref 36.0–46.0)
Hemoglobin: 13.5 g/dL (ref 12.0–15.0)
MCH: 30.1 pg (ref 26.0–34.0)
MCHC: 32.8 g/dL (ref 30.0–36.0)
MCV: 92 fL (ref 78.0–100.0)
Platelets: 178 10*3/uL (ref 150–400)
RBC: 4.48 MIL/uL (ref 3.87–5.11)
RDW: 14.2 % (ref 11.5–15.5)
WBC: 9.8 10*3/uL (ref 4.0–10.5)

## 2013-07-28 LAB — TSH: TSH: 0.518 u[IU]/mL (ref 0.350–4.500)

## 2013-07-28 LAB — MRSA PCR SCREENING: MRSA by PCR: NEGATIVE

## 2013-07-28 MED ORDER — FUROSEMIDE 40 MG PO TABS
40.0000 mg | ORAL_TABLET | Freq: Every day | ORAL | Status: DC
  Administered 2013-07-28: 40 mg via ORAL
  Filled 2013-07-28: qty 1

## 2013-07-28 MED ORDER — LORATADINE 10 MG PO TABS
10.0000 mg | ORAL_TABLET | Freq: Every day | ORAL | Status: DC
  Administered 2013-07-28: 10 mg via ORAL
  Filled 2013-07-28: qty 1

## 2013-07-28 MED ORDER — ONDANSETRON HCL 4 MG PO TABS: 4.0000 mg | ORAL_TABLET | Freq: Four times a day (QID) | ORAL | Status: DC | PRN

## 2013-07-28 MED ORDER — ASPIRIN EC 81 MG PO TBEC
81.0000 mg | DELAYED_RELEASE_TABLET | Freq: Every morning | ORAL | Status: DC
  Administered 2013-07-28: 81 mg via ORAL
  Filled 2013-07-28: qty 1

## 2013-07-28 MED ORDER — LEVOTHYROXINE SODIUM 25 MCG PO TABS: 25.0000 ug | ORAL_TABLET | Freq: Every day | ORAL | Status: DC

## 2013-07-28 MED ORDER — INSULIN ASPART 100 UNIT/ML ~~LOC~~ SOLN
0.0000 [IU] | SUBCUTANEOUS | Status: DC
  Administered 2013-07-28: 8 [IU] via SUBCUTANEOUS
  Administered 2013-07-28: 5 [IU] via SUBCUTANEOUS
  Administered 2013-07-28: 3 [IU] via SUBCUTANEOUS

## 2013-07-28 MED ORDER — INSULIN ASPART 100 UNIT/ML ~~LOC~~ SOLN
0.0000 [IU] | Freq: Four times a day (QID) | SUBCUTANEOUS | Status: DC
  Administered 2013-07-28: 11 [IU] via SUBCUTANEOUS

## 2013-07-28 MED ORDER — ACETAMINOPHEN 325 MG PO TABS: 650.0000 mg | ORAL_TABLET | Freq: Four times a day (QID) | ORAL | Status: DC | PRN

## 2013-07-28 MED ORDER — NITROGLYCERIN 0.4 MG SL SUBL: 0.4000 mg | SUBLINGUAL_TABLET | SUBLINGUAL | Status: DC | PRN

## 2013-07-28 MED ORDER — ACETAMINOPHEN 500 MG PO TABS: 500.0000 mg | ORAL_TABLET | Freq: Four times a day (QID) | ORAL | Status: AC | PRN

## 2013-07-28 MED ORDER — INSULIN GLARGINE 100 UNIT/ML ~~LOC~~ SOLN
60.0000 [IU] | Freq: Every day | SUBCUTANEOUS | Status: DC
  Filled 2013-07-28: qty 0.6

## 2013-07-28 MED ORDER — CARVEDILOL 12.5 MG PO TABS
12.5000 mg | ORAL_TABLET | Freq: Two times a day (BID) | ORAL | Status: DC
  Administered 2013-07-28: 12.5 mg via ORAL
  Filled 2013-07-28: qty 1

## 2013-07-28 MED ORDER — DEXTROSE-NACL 5-0.9 % IV SOLN
INTRAVENOUS | Status: DC
  Administered 2013-07-28: 1000 mL via INTRAVENOUS

## 2013-07-28 MED ORDER — CEPHALEXIN 500 MG PO CAPS
500.0000 mg | ORAL_CAPSULE | Freq: Two times a day (BID) | ORAL | Status: DC
  Filled 2013-07-28 (×2): qty 1

## 2013-07-28 MED ORDER — CEFTRIAXONE SODIUM 1 G IJ SOLR
1.0000 g | Freq: Every day | INTRAMUSCULAR | Status: DC
  Filled 2013-07-28: qty 10

## 2013-07-28 MED ORDER — CEPHALEXIN 500 MG PO CAPS: 500.0000 mg | ORAL_CAPSULE | Freq: Two times a day (BID) | ORAL | Status: DC

## 2013-07-28 MED ORDER — HYDROMORPHONE HCL PF 1 MG/ML IJ SOLN: 1.0000 mg | INTRAMUSCULAR | Status: DC | PRN

## 2013-07-28 MED ORDER — TOBRAMYCIN 0.3 % OP SOLN
1.0000 [drp] | OPHTHALMIC | Status: DC
  Filled 2013-07-28: qty 5

## 2013-07-28 MED ORDER — ONDANSETRON HCL 4 MG/2ML IJ SOLN
4.0000 mg | Freq: Four times a day (QID) | INTRAMUSCULAR | Status: DC | PRN
  Administered 2013-07-28: 4 mg via INTRAVENOUS
  Filled 2013-07-28: qty 2

## 2013-07-28 MED ORDER — HEPARIN SODIUM (PORCINE) 5000 UNIT/ML IJ SOLN
5000.0000 [IU] | Freq: Three times a day (TID) | INTRAMUSCULAR | Status: DC
  Administered 2013-07-28: 5000 [IU] via SUBCUTANEOUS
  Filled 2013-07-28: qty 1

## 2013-07-28 MED ORDER — HYDROMORPHONE HCL PF 1 MG/ML IJ SOLN: 0.5000 mg | INTRAMUSCULAR | Status: DC | PRN

## 2013-07-28 NOTE — Progress Notes (Signed)
Gave patient advance directives information. I shared that I would return later for a discussion and to complete if she wanted. She stated she was drowsy and would review later.

## 2013-07-28 NOTE — Care Management Note (Signed)
    Page 1 of 1   07/28/2013     2:02:38 PM CARE MANAGEMENT NOTE 07/28/2013  Patient:  Cathy [name],Cathy [name]   Account Number:  [id number]  Date Initiated:  07/28/2013  Documentation initiated by:  Sharrie RothmanBLACKWELL,Jossalyn Forgione C  Subjective/Objective Assessment:   Pt admitted from home with biliary colic and UTI. Pt lives alone and has a neighbor who checks on pt frequently. Pt is active with [facility]. Pt has a cane and walker for home use.     Action/Plan:   Arranged [facility] Pt per pts choice. Alroy BailiffLinda Lothian [facility] aware and will collect the pts information from the chart. HH services to start within 48 hours.Pt and pts nurse aware of discharge arrangements.   Anticipated DC Date:  07/28/2013   Anticipated DC Plan:  HOME W HOME HEALTH SERVICES      DC Planning Services  CM consult      [facility]   Choice offered to / List presented to:  C-1 Patient        HH arranged  HH[facility]2 PT      [facility]  [facility]   Status of service:  Completed, signed off Medicare Important Message given?  NA - LOS <3 / Initial given by admissions (If response is "NO", the following Medicare IM given date fields will be blank) Date Medicare IM given:   Medicare IM given by:   Date Additional Medicare IM given:   Additional Medicare IM given by:    Discharge Disposition:  HOME W HOME HEALTH SERVICES  Per UR Regulation:    If discussed at Long Length of Stay Meetings, dates discussed:    Comments:  07/28/13 1400 Arlyss Queenammy Jahnavi Muratore, RN BSN CM

## 2013-07-28 NOTE — Evaluation (Signed)
Physical Therapy Evaluation Patient Details Name: Cathy [name] MRN: [id number] DOB: [birth year] Today's Date: 07/28/2013   History of Present Illness  Cathy [name] is an 80 y.o. female with hx of known CAD, s/p prior CABG, recent cath 02/2013 showing stable coronaries with planned for medical therapy, hx of choledocholithiasis s/p ERCP with stone removal in 10/14, hx of DM2, hyperlipidemia, CKD, ascending thoracic aneurysm s/p repair, depression, presents to the ER with right upper quadrant pain about an hour after eating.  She has some fever, but no chills, slight nausea, no vomiting. Evalaution in the ER included a UA positive for a UTI, abdominal pelvic CT showed moderately distended gallbladder with extra hepatic bile duct dilatation without obstructing stone or mass appreciated.  Her liver fx tests were unremarkable with alk phos of 137.  Hospitalist was asked to admit her for possible choledocholithiasis and UTI.  Clinical Impression  Pt is an 80 year female who presents to PT with dx of choledocholithiasis.  Pt lives alone in a mobile home with a ramped entrance; patient reports she has friends available to assist PRN as needed.  Pt was mod (I) with bed mobility skills, transfers, and household amb with use of RW prior to hospitalization.  During evaluation, the pt was mod I with bed mobility skills, supervision and use of RW for transfers, and min guard for short distance amb with use of RW.  Recommend continued PT while in hospital to address strengthening, and activity tolerance for improvement of functional mobility skills with transition to HHPT at discharge.  No DME recommendations.     Follow Up Recommendations Home health PT          Precautions / Restrictions Precautions Precautions: Fall Restrictions Weight Bearing Restrictions: No      Mobility  Bed Mobility Overal bed mobility: Modified Independent                Transfers Overall transfer level: Needs  assistance Equipment used: Rolling walker (2 wheeled) Transfers: Sit to/from [employer] Transfers Sit to Stand: Supervision Stand pivot transfers: Supervision          Ambulation/Gait Ambulation/Gait assistance: Min guard Ambulation Distance (Feet): 10 Feet Assistive device: Rolling walker (2 wheeled) Gait Pattern/deviations: Step-through pattern;Decreased step length - right;Decreased step length - left   Gait velocity interpretation: Below normal speed for age/gender        Balance Overall balance assessment: No apparent balance deficits (not formally assessed)                                           Pertinent Vitals/Pain No complaints of pain.     Home Living Family/patient expects to be discharged to:: Private residence Living Arrangements: Alone Available Help at Discharge: Friend(s);Available PRN/intermittently Type of Home: Mobile home Home Access: Ramped entrance     Home Layout: One level Home Equipment: Wheelchair - manual;Shower seat;Bedside commode Agricultural consultant) Additional Comments: Tub shower    Prior Function Level of Independence: Independent with assistive device(s)         Comments: Uses rollator for gait.  Has Meals on Wheels for hot meals, and makes sandwiches or microwavable foot     Hand Dominance   Dominant Hand: Right    Extremity/Trunk Assessment               Lower Extremity Assessment: Generalized weakness  Communication   Communication: No difficulties  Cognition Arousal/Alertness: Awake/alert Behavior During Therapy: WFL for tasks assessed/performed Overall Cognitive Status: Within Functional Limits for tasks assessed                               Assessment/Plan    PT Assessment Patient needs continued PT services  PT Diagnosis Difficulty walking;Generalized weakness   PT Problem List Decreased strength;Decreased activity tolerance;Decreased balance  PT Treatment  Interventions Balance training;Gait training;Neuromuscular re-education;Functional mobility training;Therapeutic activities;Therapeutic exercise   PT Goals (Current goals can be found in the Care Plan section) Acute Rehab PT Goals Patient Stated Goal: go home PT Goal Formulation: With patient Time For Goal Achievement: 08/11/13 Potential to Achieve Goals: Good    Frequency Min 3X/week    End of Session Equipment Utilized During Treatment: Gait belt Activity Tolerance: Patient limited by fatigue Patient left: in chair;with call bell/phone within reach;with chair alarm set Nurse Communication: Mobility status         Time: 8270-7867 PT Time Calculation (min): 12 min   Charges:   PT Evaluation $Initial PT Evaluation Tier I: 1 Procedure          Cathy [name] 07/28/2013, 1:59 PM

## 2013-07-28 NOTE — Progress Notes (Signed)
Discharge instructions given to patient and brother at bedside. No questions. Explained importance of finishing antibiotics and keeping all appointments. Patient left in stable condition with brother. Taken out of facility by staff via wheelchair.

## 2013-07-28 NOTE — H&P (Signed)
[facility] History and Physical  Cathy [name] YWV:[id number] DOB: 1930-10-24    PCP:   Vic Blackbird, MD   Chief Complaint: right upper quadrant pain after eating.  HPI: Cathy [name] is an 80 y.o. female with hx of known CAD, s/p prior CABG, recent cath 02/2013 showing stable coronaries with planned for medical therapy, hx of choledocholithiasis s/p ERCP with stone removal in 10/14, hx of DM2, hyperlipidemia, CKD, ascending thoracic aneurysm s/p repair, depression, presents to the ER with right upper quadrant pain about an hour after eating.  She has some fever, but no chills, slight nausea, no vomiting. Evalaution in the ER included a UA positive for a UTI, abdominal pelvic CT showed moderately distended gallbladder with extra hepatic bile duct dilatation without obstructing stone or mass appreciated.  Her liver fx tests were unremarkable with alk phos of 137.  Hospitalist was asked to admit her for possible choledocholithiasis and UTI.  Rewiew of Systems:  Constitutional: Negative for malaise, fever and chills. No significant weight loss or weight gain Eyes: Negative for eye pain, redness and discharge, diplopia, visual changes, or flashes of light. ENMT: Negative for ear pain, hoarseness, nasal congestion, sinus pressure and sore throat. No headaches; tinnitus, drooling, or problem swallowing. Cardiovascular: Negative for chest pain, palpitations, diaphoresis, dyspnea and peripheral edema. ; No orthopnea, PND Respiratory: Negative for cough, hemoptysis, wheezing and stridor. No pleuritic chestpain. Gastrointestinal: Negative for nausea,  diarrhea, constipation, melena, blood in stool, hematemesis, jaundice and rectal bleeding.    Genitourinary: Negative for frequency, dysuria, incontinence,flank pain and hematuria; Musculoskeletal: Negative for back pain and neck pain. Negative for swelling and trauma.;  Skin: . Negative for pruritus, rash, abrasions, bruising and skin lesion.;  ulcerations Neuro: Negative for headache, lightheadedness and neck stiffness. Negative for weakness, altered level of consciousness , altered mental status, extremity weakness, burning feet, involuntary movement, seizure and syncope.  Psych: negative for anxiety, depression, insomnia, tearfulness, panic attacks, hallucinations, paranoia, suicidal or homicidal ideation   Past Medical History  Diagnosis Date  . Hypertension     Unspecified  . Hyperlipidemia     Mixed, statin intolerance.  [name] CAD (coronary artery disease)     a. CAD s/p CABG 2003. b. Cath 2006: occluded OM vein graft. c. Anterior MI 04/15/12 s/p PTCA only to ramus (stenting unsuccessful at that time) d. NSTEMI 06/2012: failed med rx, s/p DES to ramus c/b vessel perforation tx with balloon/stenting; e. 02/1013 Cath: LM nl, LAD mod dzs mid, LIMA ok, LCX 151m OM1 patent stent, VG->OM 100, RCA 100p, VG->PDA ok, EF 40-45%->Med Rx.  . S/P CABG (coronary artery bypass graft) 2003     2003  LIMA, LAD, SVG to the OM, SVG right coronary artery  . Thoracic ascending aortic aneurysm 2003    Ascending thoracic aneurysm repair with a graft at time of CABG  . Diabetes mellitus     Insulin dependent  . Gastroparesis   . Depression   . Allergy history, radiographic dye     Contrast dye allergy  . Previous back surgery   . Syncope 2008    Question?  2008  . Chronic systolic heart failure   . Ischemic cardiomyopathy 06/2011    a. Echo 04/19/12: EF 25-30%, mod LVH, diffuse HK, trivial AI  . Hypotension     October, 2013  . Hypothyroidism     Treated with low-dose Synthroid  . CKD (chronic kidney disease)   . Carotid artery disease     a. 80-99%  prox RICA stenosis - with recent cardiac event 06/2012, favor waiting at least 1 month, optimally 3 months post PCI.  [name] Hypertriglyceridemia   . Statin intolerance   . Peripheral neuropathy   . PAF (paroxysmal atrial fibrillation)     a. Noted during 06/2012 hospitalization. Placed on amiodarone.  Plan is for event monitor to assess for breakthrough, hold off anticoag unless additional AF seen.  [name] NSVT (nonsustained ventricular tachycardia)     a. Noted during 06/2012 hospitalization.  . Plavix Non-responder     a. Also allergic to brilinta.    Past Surgical History  Procedure Laterality Date  . Coronary artery bypass graft  05/05/2001     LIMA, LAD, SVG to the OM, SVG right coronary artery  . Bladder tacking    . Salivary gland surgery    . Total abdominal hysterectomy    . Back surgery    . Breast surgery    . External ear surgery    . Cardiac catheterization  04/15/12, 07/05/12    60-70% proximal LAD, 95% mid AV groove circumflex s/p PTCA, proximal RCA occlusion, patent LIMA to LAD, patent SVG to distal RCA, chronically occluded SVG to circumflex; LVEF 25-30%  . Ercp N/A 11/04/2012    Procedure: ENDOSCOPIC RETROGRADE CHOLANGIOPANCREATOGRAPHY (ERCP);  Surgeon: Jeryl Columbia, MD;  Location: [place];  Service: Endoscopy;  Laterality: N/A;  . Cataract extraction w/phaco Right 05/31/2013    Procedure: CATARACT EXTRACTION PHACO AND INTRAOCULAR LENS PLACEMENT (IOC);  Surgeon: Elta Guadeloupe T. Gershon Crane, MD;  Location: [facility];  Service: Ophthalmology;  Laterality: Right;  CDE: 26.18   . Cataract extraction w/phaco Left 06/14/2013    Procedure: CATARACT EXTRACTION PHACO AND INTRAOCULAR LENS PLACEMENT (IOC);  Surgeon: Elta Guadeloupe T. Gershon Crane, MD;  Location: [facility];  Service: Ophthalmology;  Laterality: Left;  CDE:11.35    Medications:  HOME MEDS: Prior to Admission medications   Medication Sig Start Date End Date Taking? Authorizing Provider  acetaminophen (TYLENOL) 500 MG tablet Take 1,000 mg by mouth every 6 (six) hours as needed for pain.    Yes Historical Provider, MD  aspirin EC 81 MG tablet Take 81 mg by mouth every morning. 04/28/12  Yes Eugene C Serpe, PA-C  carvedilol (COREG) 12.5 MG tablet Take 1 tablet (12.5 mg total) by mouth 2 (two) times daily. 07/25/13  Yes Carlena Bjornstad, MD  cetirizine (ZYRTEC)  10 MG tablet Take 10 mg by mouth daily.   Yes Historical Provider, MD  fluticasone (FLONASE) 50 MCG/ACT nasal spray Place 1 spray into both nostrils daily as needed for allergies. 03/21/13  Yes Alycia Rossetti, MD  folic acid (FOLVITE) 161 MCG tablet Take 400 mcg by mouth daily.   Yes Historical Provider, MD  furosemide (LASIX) 40 MG tablet TAKE ONE TABLET BY MOUTH ONCE DAILY. 06/24/13  Yes Alycia Rossetti, MD  hydrOXYzine (ATARAX/VISTARIL) 25 MG tablet Take one every 6 hours for itching 07/15/13  Yes Maudry Diego, MD  insulin glargine (LANTUS) 100 UNIT/ML injection Inject 0.6 mLs (60 Units total) into the skin at bedtime. 06/06/13  Yes Alycia Rossetti, MD  insulin lispro (HUMALOG) 100 UNIT/ML injection Sliding scale as before admission to the hospital. 06/08/13  Yes Alycia Rossetti, MD  levothyroxine (SYNTHROID, LEVOTHROID) 25 MCG tablet Take 1 tablet (25 mcg total) by mouth daily before breakfast. 03/21/13  Yes Alycia Rossetti, MD  meclizine (ANTIVERT) 25 MG tablet Take 1 tablet (25 mg total) by mouth 2 (two) times daily as needed  for dizziness. 07/01/13  Yes Alycia Rossetti, MD  nitroGLYCERIN (NITROSTAT) 0.4 MG SL tablet Place 1 tablet (0.4 mg total) under the tongue every 5 (five) minutes as needed for chest pain. May repeat times three. 03/21/13  Yes Alycia Rossetti, MD  ondansetron (ZOFRAN) 4 MG tablet TAKE ONE TABLET BY MOUTH EVERY 6 HOURS AS NEEDED FOR NAUSEA AND VOMITING.   Yes Alycia Rossetti, MD  tobramycin (TOBREX) 0.3 % ophthalmic solution Place 1 drop into the left eye every 4 (four) hours. 07/15/13  Yes Maudry Diego, MD     Allergies:  Allergies  Allergen Reactions  . Amiodarone Shortness Of Breath  . Brilinta [Ticagrelor] Shortness Of Breath and Nausea And Vomiting  . Iohexol Anaphylaxis       . Atorvastatin Other (See Comments)    Unknown-patient admitted to hospital after taking  . Citrus Dermatitis    BREAK OUT ON BODY  . Contrast Media [Iodinated Diagnostic Agents]  Other (See Comments)    Passed out  . Plavix [Clopidogrel Bisulfate]     Not an allergy - but PRU was drawn 06/2012 indicating Plavix hyporesponsiveness, thus she was changed to [place].  . Zolpidem Tartrate Other (See Comments)    Hallucinations  . Milk-Related Compounds Rash    Social History:   reports that she has never smoked. She has never used smokeless tobacco. She reports that she drinks about 1.5 ounces of alcohol per week. She reports that she does not use illicit drugs.  Family History: Family History  Problem Relation Age of Onset  . Heart disease Other   . Heart disease Mother   . Hypertension Mother   . Heart disease Father   . Hypertension Father      Physical Exam: Filed Vitals:   07/27/13 1627 07/27/13 1921 07/27/13 2255  BP: 163/66 140/58 137/87  Pulse: 85 68 76  Temp: 101.6 F (38.7 C)    TempSrc: Oral    Resp: 45 20 21  Height: _0  (1.651 m)    Weight: 89.812 kg (198 lb)    SpO2: 89% 96% 94%   Blood pressure 137/87, pulse 76, temperature 101.6 F (38.7 C), temperature source Oral, resp. rate 21, height _1  (1.651 m), weight 89.812 kg (198 lb), SpO2 94.00%.  GEN:  Pleasant  patient lying in the stretcher in no acute distress; cooperative with exam. PSYCH:  alert and oriented x4; does not appear anxious or depressed; affect is appropriate. HEENT: Mucous membranes pink and anicteric; PERRLA; EOM intact; no cervical lymphadenopathy nor thyromegaly or carotid bruit; no JVD; There were no stridor. Neck is very supple. Breasts:: Not examined CHEST WALL: No tenderness CHEST: Normal respiration, clear to auscultation bilaterally.  HEART: Regular rate and rhythm.  There are no murmur, rub, or gallops.   BACK: No kyphosis or scoliosis; no CVA tenderness ABDOMEN: soft and slight RUQ pain, no masses, no organomegaly, normal abdominal bowel sounds; no pannus; no intertriginous candida. There is no rebound and no distention. Rectal Exam: Not  done EXTREMITIES: No bone or joint deformity; age-appropriate arthropathy of the hands and knees; no edema; no ulcerations.  There is no calf tenderness. Genitalia: not examined PULSES: 2+ and symmetric SKIN: Normal hydration no rash or ulceration CNS: Cranial nerves 2-12 grossly intact no focal lateralizing neurologic deficit.  Speech is fluent; uvula elevated with phonation, facial symmetry and tongue midline. DTR are normal bilaterally, cerebella exam is intact, barbinski is negative and strengths are equaled bilaterally.  No sensory  loss.   Labs on Admission:  Basic Metabolic Panel:  Recent Labs Lab 07/27/13 1632  NA 139  K 4.6  CL 98  CO2 28  GLUCOSE 315*  BUN 10  CREATININE 0.81  CALCIUM 9.7   Liver Function Tests:  Recent Labs Lab 07/27/13 1632  AST 18  ALT 45*  ALKPHOS 137*  BILITOT 0.6  PROT 7.1  ALBUMIN 3.1*    Recent Labs Lab 07/27/13 1632  LIPASE 15   No results found for this basename: AMMONIA,  in the last 168 hours CBC:  Recent Labs Lab 07/27/13 1632  WBC 9.4  NEUTROABS 7.3  HGB 14.9  HCT 45.5  MCV 91.0  PLT 193    Radiological Exams on Admission: Ct Abdomen Pelvis Wo Contrast  07/27/2013   CLINICAL DATA:  Left upper quadrant pain and nausea. No IV contrast material due to contrast allergy.  EXAM: CT ABDOMEN AND PELVIS WITHOUT CONTRAST  TECHNIQUE: Multidetector CT imaging of the abdomen and pelvis was performed following the standard protocol without IV contrast.  COMPARISON:  MRI abdomen 11/02/2012, ultrasound 07/27/2013  FINDINGS: Atelectasis in the lung bases.  Cholelithiasis with moderately distended gallbladder. No gallbladder wall thickening. Mild extrahepatic bile duct dilatation. No radiopaque stones are demonstrated in the common duct. Unenhanced appearance of the liver, spleen, pancreas, adrenal glands, inferior vena cava, and retroperitoneal lymph nodes is unremarkable. Focal scarring versus angio Myo lipoma in the right kidney.  Probable cyst in the left kidney. No hydronephrosis. Calcification of aorta without aneurysm. The stomach, small bowel, and colon are mostly decompressed. Stool fills the colon. Contrast material throughout most of the small bowel without evidence of obstruction. There appears to have been partial resection of the colon although this is not indicated in the patient's surgical history. Alternatively, this could be due to non rotation of the colon. Surgical clips are demonstrated throughout the abdomen. No free air or free fluid in the abdomen.  Pelvis: Surgical clips in the pelvis with probable prior hysterectomy. No pelvic mass or lymphadenopathy. Appendix is not identified and is likely surgically absent. No free or loculated pelvic fluid collections. Degenerative changes in the lumbar spine. No destructive bone lesions appreciated. Mild spondylolisthesis at L4 on L5.  IMPRESSION: Cholelithiasis with the moderately distended gallbladder. Mild extrahepatic bile duct dilatation without obstructing stone or mass appreciated.   Electronically Signed   By: Lucienne Capers M.D.   On: 07/27/2013 22:41   US Abdomen Complete  07/27/2013   CLINICAL DATA:  Upper abdominal pain.  EXAM: ULTRASOUND ABDOMEN COMPLETE  COMPARISON:  11/01/2012  FINDINGS: Gallbladder:  Cholelithiasis is again demonstrated, without evidence of gallbladder wall thickening or pericholecystic fluid. No sonographic Murphy sign noted by sonographer.  Common bile duct:  Diameter: 12 mm, compared to 10 mm on prior exam.  Liver:  Diffusely increased echogenicity of the hepatic parenchyma, consistent with diffuse hepatic steatosis/hepatocellular disease. No focal mass lesion identified.  IVC:  No abnormality visualized.  Pancreas:  Visualized portion unremarkable.  Spleen:  Size and appearance within normal limits.  Right Kidney:  Length: 10.8 cm. Echogenicity within normal limits. No mass or hydronephrosis visualized.  Left Kidney:  Length: 12.3 cm.  Echogenicity within normal limits. No mass or hydronephrosis visualized.  Abdominal aorta:  No aneurysm visualized.  Other findings:  None.  IMPRESSION: Cholelithiasis. No definite sonographic signs of acute cholecystitis.  Dilated common bile duct, now measuring 12 mm compared with 10 mm previously. Suggest correlation with liver function tests, and consider MRCP  for further evaluation if clinically warranted.  Hepatic steatosis.   Electronically Signed   By: Earle Gell M.D.   On: 07/27/2013 18:59   Dg Chest Port 1 View  07/27/2013   CLINICAL DATA:  Chest pain, weakness.  EXAM: PORTABLE CHEST - 1 VIEW  COMPARISON:  03/12/2013  FINDINGS: Prior CABG. Cardiomegaly with vascular congestion and increasing interstitial prominence, possibly early interstitial edema. No confluent opacities or effusions. No acute bony abnormality.  IMPRESSION: Cardiomegaly with vascular congestion and possible early interstitial edema.   Electronically Signed   By: Rolm Baptise M.D.   On: 07/27/2013 17:05   Assessment/Plan Present on Admission:  . Choledocholithiasis . Hypertension . Hyperlipidemia . Statin intolerance . DM (diabetes mellitus), type 2 with neurological complications . Chronic systolic CHF (congestive heart failure) . Hypothyroidism . CKD (chronic kidney disease) . Ischemic cardiomyopathy . Common bile duct (CBD) obstruction . Cholelithiasis . UTI (urinary tract infection)  PLAN:  Will admit her for abdominal pain.  Mostly RUQ pain after eating.  She likely has another episode of choledocholithiasis, though the CT didn't show any stone in the CBD, she does have dilated ducts.  She may benefit getting a Lap CCY, so I have consulted surgery.  She also has DM, and a UTI as well.  Will continue with IV Rocephin given in the ER.  For her DM, will given SSI, and D5NS as she is made NPO.  She does have CAD, but it is stable, and she had no significant change in her disease with recent heart cath.  For her  hypothyrodism, will continue her meds, and check TSH.  Her hx CKD is stable, and her Cr is 0.8 today. She is stable, full code, and will be admitted to [facility] service.  Thank you for allowing me to participate in her care.  Other plans as per orders.  Code Status: FULL Haskel Khan, MD. [facility] Pager [phone] 7pm to 7am.  07/28/2013, 12:01 AM

## 2013-07-28 NOTE — Consult Note (Signed)
Reason for Consult: Biliary colic, cholelithiasis, history of choledocholithiasis Referring Physician: Hospitalist  Cathy [name] is an 80 y.o. female.  HPI: Patient is an 80 year old white female with multiple medical problems who is a known history of cholelithiasis, status post ERCP for choledocholithiasis in October of 2014 who presented to emergency room with worsening right upper quadrant abdominal pain and nausea. CT scan the abdomen revealed cholelithiasis with a dilated common bile duct, but no choledocholithiasis was seen. Patient was admitted to the hospital for further evaluation treatment. She states this morning she does feel better with minimal tenderness in right upper quadrant.  Past Medical History  Diagnosis Date  . Hypertension     Unspecified  . Hyperlipidemia     Mixed, statin intolerance.  [name] CAD (coronary artery disease)     a. CAD s/p CABG 2003. b. Cath 2006: occluded OM vein graft. c. Anterior MI 04/15/12 s/p PTCA only to ramus (stenting unsuccessful at that time) d. NSTEMI 06/2012: failed med rx, s/p DES to ramus c/b vessel perforation tx with balloon/stenting; e. 02/1013 Cath: LM nl, LAD mod dzs mid, LIMA ok, LCX 129m, OM1 patent stent, VG->OM 100, RCA 100p, VG->PDA ok, EF 40-45%->Med Rx.  . S/P CABG (coronary artery bypass graft) 2003     2003  LIMA, LAD, SVG to the OM, SVG right coronary artery  . Thoracic ascending aortic aneurysm 2003    Ascending thoracic aneurysm repair with a graft at time of CABG  . Diabetes mellitus     Insulin dependent  . Gastroparesis   . Depression   . Allergy history, radiographic dye     Contrast dye allergy  . Previous back surgery   . Syncope 2008    Question?  2008  . Chronic systolic heart failure   . Ischemic cardiomyopathy 06/2011    a. Echo 04/19/12: EF 25-30%, mod LVH, diffuse HK, trivial AI  . Hypotension     October, 2013  . Hypothyroidism     Treated with low-dose Synthroid  . CKD (chronic kidney disease)   .  Carotid artery disease     a. 80-99% prox RICA stenosis - with recent cardiac event 06/2012, favor waiting at least 1 month, optimally 3 months post PCI.  [name] Hypertriglyceridemia   . Statin intolerance   . Peripheral neuropathy   . PAF (paroxysmal atrial fibrillation)     a. Noted during 06/2012 hospitalization. Placed on amiodarone. Plan is for event monitor to assess for breakthrough, hold off anticoag unless additional AF seen.  [name] NSVT (nonsustained ventricular tachycardia)     a. Noted during 06/2012 hospitalization.  . Plavix Non-responder     a. Also allergic to brilinta.    Past Surgical History  Procedure Laterality Date  . Coronary artery bypass graft  05/05/2001     LIMA, LAD, SVG to the OM, SVG right coronary artery  . Bladder tacking    . Salivary gland surgery    . Total abdominal hysterectomy    . Back surgery    . Breast surgery    . External ear surgery    . Cardiac catheterization  04/15/12, 07/05/12    60-70% proximal LAD, 95% mid AV groove circumflex s/p PTCA, proximal RCA occlusion, patent LIMA to LAD, patent SVG to distal RCA, chronically occluded SVG to circumflex; LVEF 25-30%  . Ercp N/A 11/04/2012    Procedure: ENDOSCOPIC RETROGRADE CHOLANGIOPANCREATOGRAPHY (ERCP);  Surgeon: Jeryl Columbia, MD;  Location: [facility];  Service: Endoscopy;  Laterality: N/A;  .  Cataract extraction w/phaco Right 05/31/2013    Procedure: CATARACT EXTRACTION PHACO AND INTRAOCULAR LENS PLACEMENT (IOC);  Surgeon: Elta Guadeloupe T. Gershon Crane, MD;  Location: [facility];  Service: Ophthalmology;  Laterality: Right;  CDE: 26.18   . Cataract extraction w/phaco Left 06/14/2013    Procedure: CATARACT EXTRACTION PHACO AND INTRAOCULAR LENS PLACEMENT (IOC);  Surgeon: Elta Guadeloupe T. Gershon Crane, MD;  Location: [facility];  Service: Ophthalmology;  Laterality: Left;  CDE:11.35    Family History  Problem Relation Age of Onset  . Heart disease Other   . Heart disease Mother   . Hypertension Mother   . Heart disease Father   .  Hypertension Father     Social History:  reports that she has never smoked. She has never used smokeless tobacco. She reports that she drinks about 1.5 ounces of alcohol per week. She reports that she does not use illicit drugs.  Allergies:  Allergies  Allergen Reactions  . Amiodarone Shortness Of Breath  . Brilinta [Ticagrelor] Shortness Of Breath and Nausea And Vomiting  . Iohexol Anaphylaxis       . Atorvastatin Other (See Comments)    Unknown-patient admitted to hospital after taking  . Citrus Dermatitis    BREAK OUT ON BODY  . Contrast Media [Iodinated Diagnostic Agents] Other (See Comments)    Passed out  . Plavix [Clopidogrel Bisulfate]     Not an allergy - but PRU was drawn 06/2012 indicating Plavix hyporesponsiveness, thus she was changed to [place].  . Zolpidem Tartrate Other (See Comments)    Hallucinations  . Milk-Related Compounds Rash    Medications: I have reviewed the patient's current medications.  Results for orders placed during the hospital encounter of 07/27/13 (from the past 48 hour(s))  CBC WITH DIFFERENTIAL     Status: None   Collection Time    07/27/13  4:32 PM      Result Value Ref Range   WBC 9.4  4.0 - 10.5 K/uL   RBC 5.00  3.87 - 5.11 MIL/uL   Hemoglobin 14.9  12.0 - 15.0 g/dL   HCT 45.5  36.0 - 46.0 %   MCV 91.0  78.0 - 100.0 fL   MCH 29.8  26.0 - 34.0 pg   MCHC 32.7  30.0 - 36.0 g/dL   RDW 14.1  11.5 - 15.5 %   Platelets 193  150 - 400 K/uL   Neutrophils Relative % 77  43 - 77 %   Neutro Abs 7.3  1.7 - 7.7 K/uL   Lymphocytes Relative 14  12 - 46 %   Lymphs Abs 1.3  0.7 - 4.0 K/uL   Monocytes Relative 9  3 - 12 %   Monocytes Absolute 0.8  0.1 - 1.0 K/uL   Eosinophils Relative 0  0 - 5 %   Eosinophils Absolute 0.0  0.0 - 0.7 K/uL   Basophils Relative 0  0 - 1 %   Basophils Absolute 0.0  0.0 - 0.1 K/uL  COMPREHENSIVE METABOLIC PANEL     Status: Abnormal   Collection Time    07/27/13  4:32 PM      Result Value Ref Range   Sodium 139   137 - 147 mEq/L   Potassium 4.6  3.7 - 5.3 mEq/L   Chloride 98  96 - 112 mEq/L   CO2 28  19 - 32 mEq/L   Glucose, Bld 315 (*) 70 - 99 mg/dL   BUN 10  6 - 23 mg/dL   Creatinine, Ser 0.81  0.50 - 1.10 mg/dL   Calcium 9.7  8.4 - 10.5 mg/dL   Total Protein 7.1  6.0 - 8.3 g/dL   Albumin 3.1 (*) 3.5 - 5.2 g/dL   AST 18  0 - 37 U/L   ALT 45 (*) 0 - 35 U/L   Alkaline Phosphatase 137 (*) 39 - 117 U/L   Total Bilirubin 0.6  0.3 - 1.2 mg/dL   GFR calc non Af Amer 66 (*) >90 mL/min   GFR calc Af Amer 77 (*) >90 mL/min   Comment: (NOTE)     The eGFR has been calculated using the CKD EPI equation.     This calculation has not been validated in all clinical situations.     eGFR's persistently <90 mL/min signify possible Chronic Kidney     Disease.   Anion gap 13  5 - 15  LIPASE, BLOOD     Status: None   Collection Time    07/27/13  4:32 PM      Result Value Ref Range   Lipase 15  11 - 59 U/L  LACTIC ACID, PLASMA     Status: None   Collection Time    07/27/13  5:21 PM      Result Value Ref Range   Lactic Acid, Venous 2.1  0.5 - 2.2 mmol/L  URINALYSIS, ROUTINE W REFLEX MICROSCOPIC     Status: Abnormal   Collection Time    07/27/13  7:30 PM      Result Value Ref Range   Color, Urine YELLOW  YELLOW   APPearance HAZY (*) CLEAR   Specific Gravity, Urine 1.020  1.005 - 1.030   pH 5.5  5.0 - 8.0   Glucose, UA >1000 (*) NEGATIVE mg/dL   Hgb urine dipstick TRACE (*) NEGATIVE   Bilirubin Urine NEGATIVE  NEGATIVE   Ketones, ur NEGATIVE  NEGATIVE mg/dL   Protein, ur 30 (*) NEGATIVE mg/dL   Urobilinogen, UA 0.2  0.0 - 1.0 mg/dL   Nitrite NEGATIVE  NEGATIVE   Leukocytes, UA NEGATIVE  NEGATIVE  URINE MICROSCOPIC-ADD ON     Status: Abnormal   Collection Time    07/27/13  7:30 PM      Result Value Ref Range   Squamous Epithelial / LPF MANY (*) RARE   WBC, UA 21-50  <3 WBC/hpf   RBC / HPF 0-2  <3 RBC/hpf   Bacteria, UA MANY (*) RARE  MRSA PCR SCREENING     Status: None   Collection Time     07/28/13 12:50 AM      Result Value Ref Range   MRSA by PCR NEGATIVE  NEGATIVE   Comment:            The GeneXpert MRSA Assay (FDA     approved for NASAL specimens     only), is one component of a     comprehensive MRSA colonization     surveillance program. It is not     intended to diagnose MRSA     infection nor to guide or     monitor treatment for     MRSA infections.  GLUCOSE, CAPILLARY     Status: Abnormal   Collection Time    07/28/13 12:58 AM      Result Value Ref Range   Glucose-Capillary 291 (*) 70 - 99 mg/dL   Comment 1 Documented in Chart    GLUCOSE, CAPILLARY     Status: Abnormal   Collection Time    07/28/13  4:32 AM      Result Value Ref Range   Glucose-Capillary 229 (*) 70 - 99 mg/dL   Comment 1 Documented in Chart    COMPREHENSIVE METABOLIC PANEL     Status: Abnormal   Collection Time    07/28/13  4:41 AM      Result Value Ref Range   Sodium 139  137 - 147 mEq/L   Potassium 3.7  3.7 - 5.3 mEq/L   Comment: DELTA CHECK NOTED   Chloride 101  96 - 112 mEq/L   CO2 28  19 - 32 mEq/L   Glucose, Bld 250 (*) 70 - 99 mg/dL   BUN 10  6 - 23 mg/dL   Creatinine, Ser 0.84  0.50 - 1.10 mg/dL   Calcium 8.9  8.4 - 10.5 mg/dL   Total Protein 6.2  6.0 - 8.3 g/dL   Albumin 2.6 (*) 3.5 - 5.2 g/dL   AST 13  0 - 37 U/L   ALT 31  0 - 35 U/L   Alkaline Phosphatase 110  39 - 117 U/L   Total Bilirubin 0.4  0.3 - 1.2 mg/dL   GFR calc non Af Amer 63 (*) >90 mL/min   GFR calc Af Amer 74 (*) >90 mL/min   Comment: (NOTE)     The eGFR has been calculated using the CKD EPI equation.     This calculation has not been validated in all clinical situations.     eGFR's persistently <90 mL/min signify possible Chronic Kidney     Disease.   Anion gap 10  5 - 15  CBC     Status: None   Collection Time    07/28/13  4:41 AM      Result Value Ref Range   WBC 9.8  4.0 - 10.5 K/uL   RBC 4.48  3.87 - 5.11 MIL/uL   Hemoglobin 13.5  12.0 - 15.0 g/dL   HCT 41.2  36.0 - 46.0 %   MCV 92.0   78.0 - 100.0 fL   MCH 30.1  26.0 - 34.0 pg   MCHC 32.8  30.0 - 36.0 g/dL   RDW 14.2  11.5 - 15.5 %   Platelets 178  150 - 400 K/uL  GLUCOSE, CAPILLARY     Status: Abnormal   Collection Time    07/28/13  7:49 AM      Result Value Ref Range   Glucose-Capillary 199 (*) 70 - 99 mg/dL   Comment 1 Documented in Chart     Comment 2 Notify RN      Ct Abdomen Pelvis Wo Contrast  07/27/2013   CLINICAL DATA:  Left upper quadrant pain and nausea. No IV contrast material due to contrast allergy.  EXAM: CT ABDOMEN AND PELVIS WITHOUT CONTRAST  TECHNIQUE: Multidetector CT imaging of the abdomen and pelvis was performed following the standard protocol without IV contrast.  COMPARISON:  MRI abdomen 11/02/2012, ultrasound 07/27/2013  FINDINGS: Atelectasis in the lung bases.  Cholelithiasis with moderately distended gallbladder. No gallbladder wall thickening. Mild extrahepatic bile duct dilatation. No radiopaque stones are demonstrated in the common duct. Unenhanced appearance of the liver, spleen, pancreas, adrenal glands, inferior vena cava, and retroperitoneal lymph nodes is unremarkable. Focal scarring versus angio Myo lipoma in the right kidney. Probable cyst in the left kidney. No hydronephrosis. Calcification of aorta without aneurysm. The stomach, small bowel, and colon are mostly decompressed. Stool fills the colon. Contrast material throughout most of the small bowel without evidence of  obstruction. There appears to have been partial resection of the colon although this is not indicated in the patient's surgical history. Alternatively, this could be due to non rotation of the colon. Surgical clips are demonstrated throughout the abdomen. No free air or free fluid in the abdomen.  Pelvis: Surgical clips in the pelvis with probable prior hysterectomy. No pelvic mass or lymphadenopathy. Appendix is not identified and is likely surgically absent. No free or loculated pelvic fluid collections. Degenerative changes  in the lumbar spine. No destructive bone lesions appreciated. Mild spondylolisthesis at L4 on L5.  IMPRESSION: Cholelithiasis with the moderately distended gallbladder. Mild extrahepatic bile duct dilatation without obstructing stone or mass appreciated.   Electronically Signed   By: Lucienne Capers M.D.   On: 07/27/2013 22:41   US Abdomen Complete  07/27/2013   CLINICAL DATA:  Upper abdominal pain.  EXAM: ULTRASOUND ABDOMEN COMPLETE  COMPARISON:  11/01/2012  FINDINGS: Gallbladder:  Cholelithiasis is again demonstrated, without evidence of gallbladder wall thickening or pericholecystic fluid. No sonographic Murphy sign noted by sonographer.  Common bile duct:  Diameter: 12 mm, compared to 10 mm on prior exam.  Liver:  Diffusely increased echogenicity of the hepatic parenchyma, consistent with diffuse hepatic steatosis/hepatocellular disease. No focal mass lesion identified.  IVC:  No abnormality visualized.  Pancreas:  Visualized portion unremarkable.  Spleen:  Size and appearance within normal limits.  Right Kidney:  Length: 10.8 cm. Echogenicity within normal limits. No mass or hydronephrosis visualized.  Left Kidney:  Length: 12.3 cm. Echogenicity within normal limits. No mass or hydronephrosis visualized.  Abdominal aorta:  No aneurysm visualized.  Other findings:  None.  IMPRESSION: Cholelithiasis. No definite sonographic signs of acute cholecystitis.  Dilated common bile duct, now measuring 12 mm compared with 10 mm previously. Suggest correlation with liver function tests, and consider MRCP for further evaluation if clinically warranted.  Hepatic steatosis.   Electronically Signed   By: Earle Gell M.D.   On: 07/27/2013 18:59   Dg Chest Port 1 View  07/27/2013   CLINICAL DATA:  Chest pain, weakness.  EXAM: PORTABLE CHEST - 1 VIEW  COMPARISON:  03/12/2013  FINDINGS: Prior CABG. Cardiomegaly with vascular congestion and increasing interstitial prominence, possibly early interstitial edema. No confluent  opacities or effusions. No acute bony abnormality.  IMPRESSION: Cardiomegaly with vascular congestion and possible early interstitial edema.   Electronically Signed   By: Rolm Baptise M.D.   On: 07/27/2013 17:05    ROS: See chart Blood pressure 143/68, pulse 71, temperature 98.8 F (37.1 C), temperature source Oral, resp. rate 20, height $RemoveBe'5\' 5"'SMeFSBKUo$  (1.651 m), weight 90.8 kg (200 lb 2.8 oz), SpO2 90.00%. Physical Exam: Pleasant white female in no acute distress. Abdomen is soft with minimal tenderness in right upper quadrant to palpation. Not splenomegaly, masses, hernias identified. A large midline surgical scar is noted throughout the length of the abdomen.  Assessment/Plan: Impression: Biliary colic secondary to cholelithiasis, history of choledocholithiasis. Liver enzyme tests are within normal limits. I do not know why she did not have a cholecystectomy after her ERCP and stone extraction in October 2014. She does have a significant cardiac history and the hospitalist is getting cardiac clearance for any surgical intervention. The patient wants to have the surgery done in the [facility] by Dr. Anthony Sar, which is fine with me. She does not need acute surgical intervention. We'll follow peripherally with you.  Yashar Inclan A 07/28/2013, 9:08 AM

## 2013-07-28 NOTE — Plan of Care (Signed)
Problem: Consults Goal: General Medical Patient Education See Patient Education Module for specific education. Outcome: Progressing Patient admitted with moderate inflammation of her gallbladder.  Consult with general surgery in the am Goal: Diabetes Guidelines if Diabetic/Glucose > 140 If diabetic or lab glucose is > 140 mg/dl - Initiate Diabetes/Hyperglycemia Guidelines & Document Interventions  Outcome: Progressing Patient admitted with elevated blood sugar.  Monitoring  Problem: Phase I Progression Outcomes Goal: Pain controlled with appropriate interventions Outcome: Progressing Mild mid epigastric pain radiating around to the right.  Rated 3/10 Goal: OOB as tolerated unless otherwise ordered Outcome: Progressing Patient is unsteady to bsc, PT should see

## 2013-07-28 NOTE — Progress Notes (Signed)
PROGRESS NOTE  Cathy KohlerFreda H [name] ZOX:[id number] DOB: 1930-02-11 DOA: 07/27/2013 PCP: Milinda AntisURHAM, KAWANTA, MD  Summary: 80 year old woman with history of choledocholithiasis, status post ERCP status post stone removal October 2014 who presented with right upper quadrant pain after eating. Evaluation suggested UTI and CT showed distended gallbladder with extra hepatic bile duct dilatation without obstructing stone. Admitted for suspected choledocholithiasis.  Assessment/Plan: 1. Suspected choledocholithiasis, right upper quadrant abdominal pain. Symptomatically improved today. LFTs have returned to normal. 2. Cholelithiasis with dilated common bile duct. H/o of choledocholithiasis with ERCP in the past s/p stone removal 10/14.  3. UTI. Stable. 4. DM type 2. Stable. Continue current management. 5. CKD stage II, stable. 6. H/o CABG, coronary artery disease, chronic systolic congestive heart failure LVEF approximately 40%. Seen by cardiology in the office 7/6, coronary disease, heart failure felt to be stable, medical therapy recommended.   Supportive care. Gen. surgery consultation for consideration of laparoscopic cholecystectomy. Recently evaluated by cardiology as an outpatient, coronary artery disease and heart failure felt to be stable and medical therapy recommended. Discussed with Dr. Roe RutherfordKatz--he requests patient be seen by cardiology team here at [facility]. Check EKG for baseline.  Continue empiric antibiotics for UTI, followup culture  Consult PT  Code Status: full code DVT prophylaxis: heparin Family Communication: none present Disposition Plan: home when improved  Brendia Sacksaniel Brighten Orndoff, MD  [facility]  Pager [phone] If 7PM-7AM, please contact night-coverage at [email or web address], password [email or web address] 07/28/2013, 7:58 AM  LOS: 1 day   Consultants:  General surgery  Procedures:    Antibiotics:  Ceftriaxone 7/8 >>   HPI/Subjective: Noted to be unsteady on ambulation.  Feels better. No  vomiting. Some nausea. Mild right upper quadrant abdominal discomfort. No chest pain or shortness of breath. No recent chest pain.  Objective: Filed Vitals:   07/28/13 0000 07/28/13 0109 07/28/13 0400 07/28/13 0749  BP: 131/54 145/66  143/68  Pulse: 74 74  71  Temp:  99 F (37.2 C) 99 F (37.2 C) 98.8 F (37.1 C)  TempSrc:  Oral Oral Oral  Resp: 23   20  Height:  5\' 5"  (1.651 m)    Weight:  90.8 kg (200 lb 2.8 oz)    SpO2: 94% 96%  90%    Intake/Output Summary (Last 24 hours) at 07/28/13 0758 Last data filed at 07/28/13 0630  Gross per 24 hour  Intake      0 ml  Output    450 ml  Net   -450 ml     Filed Weights   07/27/13 1627 07/28/13 0109  Weight: 89.812 kg (198 lb) 90.8 kg (200 lb 2.8 oz)    Exam:   Afebrile, vital signs stable. No hypoxia. Gen. Appears calm, comfortable.  Psych. Grossly normal mood and affect. Speech fluent and clear. Cardiovascular. Regular rate and rhythm. No murmur, rub or gallop. No lower extremity edema. Respiratory. Clear to auscultation bilaterally. No wheezes, rales or rhonchi. Normal respiratory effort. Abdomen. Obese, soft, nontender. Large vertical incision well-healed. No hernias or masses appreciated.  Data Reviewed: Chemistry: Complete metabolic panel notable for resolution of elevated alkaline phosphatase and ALT. Total bilirubin normal. Heme: CBC within normal limits ID: Urinalysis was grossly positive. Culture pending. Imaging: CT of the abdomen and pelvis with cholelithiasis, moderately distended gallbladder. Mild extrahepatic bile duct dilatation without obstructing stone or mass seen.  Scheduled Meds: . cefTRIAXone (ROCEPHIN)  IV  1 g Intravenous QHS  . heparin  5,000 Units Subcutaneous 3 times per day  . insulin aspart  0-15 Units Subcutaneous 6 times per day   Continuous Infusions: . sodium chloride Stopped (07/27/13 2011)  . dextrose 5 % and 0.9% NaCl 1,000 mL (07/28/13 0139)    Principal Problem:    Choledocholithiasis Active Problems:   Hypertension   Hyperlipidemia   S/P CABG (coronary artery bypass graft)   Statin intolerance   DM (diabetes mellitus), type 2 with neurological complications   Hypothyroidism   CKD (chronic kidney disease)   Chronic systolic CHF (congestive heart failure)   Ischemic cardiomyopathy   Cholelithiasis   UTI (urinary tract infection)   Common bile duct (CBD) obstruction   Time spent 20 minutes

## 2013-07-28 NOTE — Discharge Summary (Signed)
Physician Discharge Summary  Cathy [name] ZOX:[id number] DOB: 07-Jul-1930 DOA: 07/27/2013  PCP: Milinda Antis, MD  Admit date: 07/27/2013 Discharge date: 07/28/2013  Recommendations for Outpatient Follow-up:  1. Biliary colic, cholelithiasis, suspected choledocholithiasis (transient). Recommend elective cholecystectomy. See discussion below. 2. Resolution of UTI. Culture pending.  3. Home health physical therapy assessment for reported gait ataxia. Patient ambulates with a walker at home and denies recent falls.   Follow-up Information   Follow up with [name], MD On 09/14/2013. (9:30 am Pre-OP Clearance)    Specialty:  Cardiology   Contact information:   [address] [place] [address] [phone]       Follow up with Erskine Speed, MD On 08/01/2013. (3:00 PM for evaluation of gallbladder stones. )    Specialty:  Surgery   Contact information:   [address] [place] [address] [phone]       Follow up with Milinda Antis, MD. Schedule an appointment as soon as possible for a visit on 08/04/2013.   Specialty:  Family Medicine   Contact information:   [address] [place] [address] [phone]      Discharge Diagnoses:  1. Suspected transient choledocholithiasis, right upper quadrant pain 2. Cholelithiasis with dilated common bile duct acute and chronic 3. UTI 4. Diabetes mellitus type 2  Discharge Condition: Improved Disposition: Home  Diet recommendation: Low-fat  Filed Weights   07/27/13 1627 07/28/13 0109  Weight: 89.812 kg (198 lb) 90.8 kg (200 lb 2.8 oz)    History of present illness:  80 year old woman with history of choledocholithiasis, status post ERCP status post stone removal October 2014 who presented with right upper quadrant pain after eating. Evaluation suggested UTI and CT showed distended gallbladder with extra hepatic bile duct dilatation without obstructing stone. Admitted for suspected choledocholithiasis.  Hospital  Course:  [name] was admitted for further evaluation of suspected choledocholithiasis. Nausea, vomiting and pain resolved. LFTs return to normal. Diet advanced and tolerated. MRCP not felt indicated at this point She was seen by general surgery, however the patient declined to pursue surgery at this point. In fact she declines any surgery. However if she ever have surgery she would want to have surgery in [facility] with Dr. Gabriel Cirri. We discussed the possibility of recurrent gallstones, choledocholithiasis, cholangitis, infection and severe illness. She understands but declines surgery at this point. She was seen here by general surgery who felt further evaluation can be conducted in the outpatient setting. Her primary cardiologist recommended further evaluation prior to surgery, this was discussed with the cardiology team who felt no further testing is warranted at this time and suggested outpatient followup with her primary cardiologist to answer any further questions. I have referred her to her surgeon of choice for further evaluation and recommendations  1. Suspected choledocholithiasis, right upper quadrant abdominal pain. Resolved, tolerated diet. LFTs have returned to normal. 2. Cholelithiasis with dilated common bile duct. H/o of choledocholithiasis with ERCP in the past s/p stone removal 10/14.  3. UTI. Compared antibiotics. Followup culture. 4. DM type 2. Stable.  5. CKD stage II, stable. 6. H/o CABG, coronary artery disease, chronic systolic congestive heart failure LVEF approximately 40%. Seen by cardiology in the office 7/6, coronary disease, heart failure felt to be stable, medical therapy recommended.  Consultants:  General surgery Procedures:  Antibiotics:  Ceftriaxone 7/8 >> 7/8  Keflex 7/9 >> 7/10  Discharge Instructions  Discharge Instructions   Diet - low sodium heart healthy  Complete by:  As directed      Discharge instructions    Complete by:  As directed   Call your  physician or seek immediate medical attention for recurrent abdominal pain, vomiting, fever or worsening of condition. Testing showed stones in your gallbladder which are the likely source of your pain. It is recommended that you have your gallbladder taken out by a surgeon in the near future. Gallstones can travel and cause recurrent pain, infection, fever and severe illness. Please followup with your surgeon of choice for further recommendations. An appointment has been made with your cardiologist to answer preoperative questions. You have been placed on an antibiotic for bladder infection.     Increase activity slowly    Complete by:  As directed             Medication List         acetaminophen 500 MG tablet  Commonly known as:  TYLENOL  Take 1 tablet (500 mg total) by mouth every 6 (six) hours as needed for mild pain.     aspirin EC 81 MG tablet  Take 81 mg by mouth every morning.     carvedilol 12.5 MG tablet  Commonly known as:  COREG  Take 1 tablet (12.5 mg total) by mouth 2 (two) times daily.     cephALEXin 500 MG capsule  Commonly known as:  KEFLEX  Take 1 capsule (500 mg total) by mouth every 12 (twelve) hours. For bladder infection.     cetirizine 10 MG tablet  Commonly known as:  ZYRTEC  Take 10 mg by mouth daily.     fluticasone 50 MCG/ACT nasal spray  Commonly known as:  FLONASE  Place 1 spray into both nostrils daily as needed for allergies.     folic acid 400 MCG tablet  Commonly known as:  FOLVITE  Take 400 mcg by mouth daily.     furosemide 40 MG tablet  Commonly known as:  LASIX  TAKE ONE TABLET BY MOUTH ONCE DAILY.     hydrOXYzine 25 MG tablet  Commonly known as:  ATARAX/VISTARIL  Take one every 6 hours for itching     insulin glargine 100 UNIT/ML injection  Commonly known as:  LANTUS  Inject 0.6 mLs (60 Units total) into the skin at bedtime.     insulin lispro 100 UNIT/ML injection  Commonly known as:  HUMALOG  Sliding scale as before  admission to the hospital.     levothyroxine 25 MCG tablet  Commonly known as:  SYNTHROID, LEVOTHROID  Take 1 tablet (25 mcg total) by mouth daily before breakfast.     meclizine 25 MG tablet  Commonly known as:  ANTIVERT  Take 1 tablet (25 mg total) by mouth 2 (two) times daily as needed for dizziness.     nitroGLYCERIN 0.4 MG SL tablet  Commonly known as:  NITROSTAT  Place 1 tablet (0.4 mg total) under the tongue every 5 (five) minutes as needed for chest pain. May repeat times three.     ondansetron 4 MG tablet  Commonly known as:  ZOFRAN  TAKE ONE TABLET BY MOUTH EVERY 6 HOURS AS NEEDED FOR NAUSEA AND VOMITING.     tobramycin 0.3 % ophthalmic solution  Commonly known as:  TOBREX  Place 1 drop into the left eye every 4 (four) hours.       Allergies  Allergen Reactions  . Amiodarone Shortness Of Breath  . Brilinta [Ticagrelor] Shortness Of Breath and Nausea And Vomiting  .  Iohexol Anaphylaxis       . Atorvastatin Other (See Comments)    Unknown-patient admitted to hospital after taking  . Citrus Dermatitis    BREAK OUT ON BODY  . Contrast Media [Iodinated Diagnostic Agents] Other (See Comments)    Passed out  . Plavix [Clopidogrel Bisulfate]     Not an allergy - but PRU was drawn 06/2012 indicating Plavix hyporesponsiveness, thus she was changed to Brilinta.  . Zolpidem Tartrate Other (See Comments)    Hallucinations  . Milk-Related Compounds Rash    The results of significant diagnostics from this hospitalization (including imaging, microbiology, ancillary and laboratory) are listed below for reference.    Significant Diagnostic Studies: Ct Abdomen Pelvis Wo Contrast  07/27/2013   CLINICAL DATA:  Left upper quadrant pain and nausea. No IV contrast material due to contrast allergy.  EXAM: CT ABDOMEN AND PELVIS WITHOUT CONTRAST  TECHNIQUE: Multidetector CT imaging of the abdomen and pelvis was performed following the standard protocol without IV contrast.  COMPARISON:   MRI abdomen 11/02/2012, ultrasound 07/27/2013  FINDINGS: Atelectasis in the lung bases.  Cholelithiasis with moderately distended gallbladder. No gallbladder wall thickening. Mild extrahepatic bile duct dilatation. No radiopaque stones are demonstrated in the common duct. Unenhanced appearance of the liver, spleen, pancreas, adrenal glands, inferior vena cava, and retroperitoneal lymph nodes is unremarkable. Focal scarring versus angio Myo lipoma in the right kidney. Probable cyst in the left kidney. No hydronephrosis. Calcification of aorta without aneurysm. The stomach, small bowel, and colon are mostly decompressed. Stool fills the colon. Contrast material throughout most of the small bowel without evidence of obstruction. There appears to have been partial resection of the colon although this is not indicated in the patient's surgical history. Alternatively, this could be due to non rotation of the colon. Surgical clips are demonstrated throughout the abdomen. No free air or free fluid in the abdomen.  Pelvis: Surgical clips in the pelvis with probable prior hysterectomy. No pelvic mass or lymphadenopathy. Appendix is not identified and is likely surgically absent. No free or loculated pelvic fluid collections. Degenerative changes in the lumbar spine. No destructive bone lesions appreciated. Mild spondylolisthesis at L4 on L5.  IMPRESSION: Cholelithiasis with the moderately distended gallbladder. Mild extrahepatic bile duct dilatation without obstructing stone or mass appreciated.   Electronically Signed   By: Burman Nieves M.D.   On: 07/27/2013 22:41   US Abdomen Complete  07/27/2013   CLINICAL DATA:  Upper abdominal pain.  EXAM: ULTRASOUND ABDOMEN COMPLETE  COMPARISON:  11/01/2012  FINDINGS: Gallbladder:  Cholelithiasis is again demonstrated, without evidence of gallbladder wall thickening or pericholecystic fluid. No sonographic Murphy sign noted by sonographer.  Common bile duct:  Diameter: 12 mm,  compared to 10 mm on prior exam.  Liver:  Diffusely increased echogenicity of the hepatic parenchyma, consistent with diffuse hepatic steatosis/hepatocellular disease. No focal mass lesion identified.  IVC:  No abnormality visualized.  Pancreas:  Visualized portion unremarkable.  Spleen:  Size and appearance within normal limits.  Right Kidney:  Length: 10.8 cm. Echogenicity within normal limits. No mass or hydronephrosis visualized.  Left Kidney:  Length: 12.3 cm. Echogenicity within normal limits. No mass or hydronephrosis visualized.  Abdominal aorta:  No aneurysm visualized.  Other findings:  None.  IMPRESSION: Cholelithiasis. No definite sonographic signs of acute cholecystitis.  Dilated common bile duct, now measuring 12 mm compared with 10 mm previously. Suggest correlation with liver function tests, and consider MRCP for further evaluation if clinically warranted.  Hepatic  steatosis.   Electronically Signed   By: Myles RosenthalJohn  Stahl M.D.   On: 07/27/2013 18:59   Dg Chest Port 1 View  07/27/2013   CLINICAL DATA:  Chest pain, weakness.  EXAM: PORTABLE CHEST - 1 VIEW  COMPARISON:  03/12/2013  FINDINGS: Prior CABG. Cardiomegaly with vascular congestion and increasing interstitial prominence, possibly early interstitial edema. No confluent opacities or effusions. No acute bony abnormality.  IMPRESSION: Cardiomegaly with vascular congestion and possible early interstitial edema.   Electronically Signed   By: Charlett NoseKevin  Dover M.D.   On: 07/27/2013 17:05    Microbiology: Recent Results (from the past 240 hour(s))  MRSA PCR SCREENING     Status: None   Collection Time    07/28/13 12:50 AM      Result Value Ref Range Status   MRSA by PCR NEGATIVE  NEGATIVE Final   Comment:            The GeneXpert MRSA Assay (FDA     approved for NASAL specimens     only), is one component of a     comprehensive MRSA colonization     surveillance program. It is not     intended to diagnose MRSA     infection nor to guide or      monitor treatment for     MRSA infections.     Labs: Basic Metabolic Panel:  Recent Labs Lab 07/27/13 1632 07/28/13 0441  NA 139 139  K 4.6 3.7  CL 98 101  CO2 28 28  GLUCOSE 315* 250*  BUN 10 10  CREATININE 0.81 0.84  CALCIUM 9.7 8.9   Liver Function Tests:  Recent Labs Lab 07/27/13 1632 07/28/13 0441  AST 18 13  ALT 45* 31  ALKPHOS 137* 110  BILITOT 0.6 0.4  PROT 7.1 6.2  ALBUMIN 3.1* 2.6*    Recent Labs Lab 07/27/13 1632  LIPASE 15   CBC:  Recent Labs Lab 07/27/13 1632 07/28/13 0441  WBC 9.4 9.8  NEUTROABS 7.3  --   HGB 14.9 13.5  HCT 45.5 41.2  MCV 91.0 92.0  PLT 193 178   CBG:  Recent Labs Lab 07/28/13 0058 07/28/13 0432 07/28/13 0749 07/28/13 1124  GLUCAP 291* 229* 199* 311*    Principal Problem:   Choledocholithiasis Active Problems:   Hypertension   Hyperlipidemia   S/P CABG (coronary artery bypass graft)   Statin intolerance   DM (diabetes mellitus), type 2 with neurological complications   Hypothyroidism   CKD (chronic kidney disease)   Chronic systolic CHF (congestive heart failure)   Ischemic cardiomyopathy   Cholelithiasis   UTI (urinary tract infection)   Common bile duct (CBD) obstruction   Time coordinating discharge: 35 minutes  Signed:  Brendia Sacksaniel Goodrich, MD [facility] 07/28/2013, 1:39 PM

## 2013-07-28 NOTE — Progress Notes (Signed)
UR chart review completed.  

## 2013-07-28 NOTE — Progress Notes (Signed)
Cardiology and general surgery consults called per orders.

## 2013-07-29 ENCOUNTER — Telehealth: Payer: Self-pay | Admitting: *Deleted

## 2013-07-29 NOTE — Telephone Encounter (Signed)
[facility] stating needing [employer] for authorization to Karen KaysJefferay Katz NPI number [id number], submitted authorization by paperwork [employer] d/t pt not in system thru [employer], pending authorizaton

## 2013-07-30 LAB — URINE CULTURE: Colony Count: >=100000

## 2013-08-01 ENCOUNTER — Telehealth: Payer: Self-pay | Admitting: *Deleted

## 2013-08-01 ENCOUNTER — Other Ambulatory Visit: Payer: Self-pay | Admitting: Dermatology

## 2013-08-01 NOTE — Telephone Encounter (Signed)
[facility] with authorizaton number [id number] for Dr[facility]rey [facility]. Authorization fax to [facility].

## 2013-08-01 NOTE — Telephone Encounter (Signed)
[facility] on Discharge Deposition informing of pt has been discharged [facility] on [facility]/13 to home;self care. For diagnosis Cholelithiasis. Attending phyician Houston SirenPeter, Le MD

## 2013-08-03 ENCOUNTER — Other Ambulatory Visit: Payer: Self-pay | Admitting: Family Medicine

## 2013-08-03 ENCOUNTER — Inpatient Hospital Stay: Payer: Medicare HMO | Admitting: Family Medicine

## 2013-08-03 NOTE — Telephone Encounter (Signed)
Refill appropriate and filled per protocol. 

## 2013-08-08 ENCOUNTER — Telehealth: Payer: Self-pay | Admitting: *Deleted

## 2013-08-08 NOTE — Telephone Encounter (Signed)
Submitted [employer] Referral thru [employer] for authorization, received authorization number [id number], information faxed to Dr. Isabel CapriceGrapey [facility] at [phone]

## 2013-08-10 NOTE — Telephone Encounter (Signed)
Received [employer] with authorization number [id number], informaton faxed to Dr. Isabel CapriceGrapey

## 2013-08-16 ENCOUNTER — Other Ambulatory Visit: Payer: Self-pay | Admitting: Family Medicine

## 2013-08-16 NOTE — Telephone Encounter (Signed)
okay

## 2013-08-16 NOTE — Telephone Encounter (Signed)
Ok to refill??  Last office visit 05/04/2013.  Last refill 08/03/2013

## 2013-08-16 NOTE — Telephone Encounter (Signed)
Prescription sent to pharmacy.

## 2013-08-17 ENCOUNTER — Encounter: Payer: Self-pay | Admitting: Family Medicine

## 2013-08-17 ENCOUNTER — Ambulatory Visit (INDEPENDENT_AMBULATORY_CARE_PROVIDER_SITE_OTHER): Payer: Medicare HMO | Admitting: Family Medicine

## 2013-08-17 VITALS — BP 126/68 | HR 72 | Temp 98.1°F | Resp 18 | Wt 194.0 lb

## 2013-08-17 DIAGNOSIS — I10 Essential (primary) hypertension: Secondary | ICD-10-CM

## 2013-08-17 DIAGNOSIS — S80822A Blister (nonthermal), left lower leg, initial encounter: Secondary | ICD-10-CM

## 2013-08-17 DIAGNOSIS — Z5189 Encounter for other specified aftercare: Secondary | ICD-10-CM

## 2013-08-17 DIAGNOSIS — L089 Local infection of the skin and subcutaneous tissue, unspecified: Secondary | ICD-10-CM

## 2013-08-17 DIAGNOSIS — K8050 Calculus of bile duct without cholangitis or cholecystitis without obstruction: Secondary | ICD-10-CM

## 2013-08-17 DIAGNOSIS — E1141 Type 2 diabetes mellitus with diabetic mononeuropathy: Secondary | ICD-10-CM

## 2013-08-17 DIAGNOSIS — G589 Mononeuropathy, unspecified: Secondary | ICD-10-CM

## 2013-08-17 DIAGNOSIS — S80829A Blister (nonthermal), unspecified lower leg, initial encounter: Secondary | ICD-10-CM

## 2013-08-17 DIAGNOSIS — N390 Urinary tract infection, site not specified: Secondary | ICD-10-CM

## 2013-08-17 DIAGNOSIS — S80822D Blister (nonthermal), left lower leg, subsequent encounter: Secondary | ICD-10-CM

## 2013-08-17 DIAGNOSIS — E1149 Type 2 diabetes mellitus with other diabetic neurological complication: Secondary | ICD-10-CM

## 2013-08-17 LAB — HEMOGLOBIN A1C, FINGERSTICK: Hgb A1C (fingerstick): 9.3 % — ABNORMAL HIGH (ref <5.7)

## 2013-08-17 MED ORDER — NITROFURANTOIN MONOHYD MACRO 100 MG PO CAPS: 100.0000 mg | ORAL_CAPSULE | Freq: Every day | ORAL | Status: DC

## 2013-08-17 NOTE — Progress Notes (Signed)
Patient ID: Cathy [name], female   DOB: March 22, 1930, 80 y.o.   MRN: [id number]   Subjective:    Patient ID: Cathy KohlerFreda H Lenhard, female    DOB: March 22, 1930, 80 y.o.   MRN: [id number]  Patient presents for Hospital F/U and Open Wound to L lower leg  Patient here for hospital followup. She was recently admitted secondary to cholecystitis and cholelithiasis however do to her complex medical history and cardiac history the decision was made to hold off on surgical removal of the gallbladder. She's back to eating at her baseline.  Diabetes mellitus she missed her followup appointment with her endocrinologist her last A1c was 5 months ago at that time 8.6%. She has her meter with her today her 7 day average is 264 her 30 day average is 258 she states that her blood sugar has been running in the 300s. She has been injecting Lantus 60 units at bedtime and using sliding scale during the day.  Infected blisters she had an infected blister on her left lower extremity she's not sure that any trauma to the leg however she did sustain an near fall he is a week. [facility] was prescribed Keflex antibiotics which she has 2 tablets left. There was some clear drainage the other night and now no blisters completely flat.  Her physical therapy is currently on hold.   Review Of Systems:  GEN- denies fatigue, fever, weight loss,weakness, recent illness HEENT- denies eye drainage, change in vision, nasal discharge, CVS- denies chest pain, palpitations RESP- denies SOB, cough, wheeze ABD- denies N/V, change in stools, abd pain GU- denies dysuria, hematuria, dribbling, incontinence MSK- +joint pain, muscle aches, injury Neuro- denies headache, dizziness, syncope, seizure activity       Objective:    BP 126/68  Pulse 72  Temp(Src) 98.1 F (36.7 C) (Oral)  Resp 18  Wt 194 lb (87.998 kg) GEN- NAD, alert and oriented x3, sitting in wheelchair, HEENT- PERRL, EOMI, non injected sclera, pink  conjunctiva, oropharynx clear, Neck- Supple, no LAD CVS- RRR, no murmur RESP-CTAB  ABD-NABS,soft,NT,ND,  EXT- trace edema, venous stasis changes Skin- Left leg 2cm flat bilster, mild erythema, NT, no drainage from lesion  Pulses- Radial 2+, DP decreased bilat         Assessment & Plan:      Problem List Items Addressed This Visit   DM (diabetes mellitus), type 2 with neurological complications - Primary   Relevant Orders      Hemoglobin A1C, fingerstick      Note: This dictation was prepared with Dragon dictation along with smaller phrase technology. Any transcriptional errors that result from this process are unintentional.

## 2013-08-17 NOTE — Assessment & Plan Note (Signed)
Blood pressure is well-controlled at his medication

## 2013-08-17 NOTE — Assessment & Plan Note (Signed)
No current abdominal pain she is eating well. Surgery has been postponed due to her medical complexity is high risk of surgery

## 2013-08-17 NOTE — Assessment & Plan Note (Addendum)
Diabetes mellitus uncontrolled she missed her last appointment with her endocrinologist. Her A1c today is 9.3%. I will increase her Lantus to 65 units and sliding scale we'll move to 15 units with meals plus sliding scale Diabetic shoes ordered

## 2013-08-17 NOTE — Assessment & Plan Note (Addendum)
Wound dressed at bedside. She will complete antibiotics for this

## 2013-08-17 NOTE — Patient Instructions (Addendum)
Your A1C is 9.3% Increase lantus to 65 units Given 15 units plus sliding scale of humalog Diabetic shoes to be ordered UTI antibiotic sent to pharmacy F/U 3 months

## 2013-08-17 NOTE — Assessment & Plan Note (Signed)
Recurrent UTI she's been seen by urology I sent in her Macrobid 100 mg once a day for prophylaxis

## 2013-08-23 ENCOUNTER — Telehealth: Payer: Self-pay | Admitting: *Deleted

## 2013-08-23 ENCOUNTER — Other Ambulatory Visit: Payer: Self-pay | Admitting: Family Medicine

## 2013-08-23 MED ORDER — MECLIZINE HCL 25 MG PO TABS: ORAL_TABLET | ORAL | Status: DC

## 2013-08-23 NOTE — Telephone Encounter (Signed)
Received fax requesting refill on Meclizine.   Ok to refill?

## 2013-08-23 NOTE — Telephone Encounter (Signed)
Refill appropriate and filled per protocol. 

## 2013-08-23 NOTE — Telephone Encounter (Signed)
okay

## 2013-08-23 NOTE — Telephone Encounter (Signed)
Prescription sent to pharmacy.

## 2013-08-24 ENCOUNTER — Telehealth: Payer: Self-pay | Admitting: Family Medicine

## 2013-08-24 ENCOUNTER — Telehealth: Payer: Self-pay | Admitting: *Deleted

## 2013-08-24 NOTE — Telephone Encounter (Signed)
Pt Lantus Vials(4) came in today pt is aware they are here and will see if her nurse will come and pick them up, also gave 2 boxes of Humulog samples for her has well until her order comes in.

## 2013-08-24 NOTE — Telephone Encounter (Signed)
CMN for diabetic shoes have been received and faxed to pharmacy.   Pharmacy will contact patient to fit for shoes.

## 2013-08-24 NOTE — Telephone Encounter (Signed)
PT is needing to speak to you about her diabetic shoes that were ordered. Call back number is [phone]

## 2013-08-27 ENCOUNTER — Encounter: Payer: Self-pay | Admitting: Interventional Cardiology

## 2013-08-31 ENCOUNTER — Other Ambulatory Visit: Payer: Self-pay | Admitting: Family Medicine

## 2013-08-31 NOTE — Telephone Encounter (Signed)
Refill appropriate and filled per protocol. 

## 2013-09-02 ENCOUNTER — Telehealth: Payer: Self-pay | Admitting: *Deleted

## 2013-09-02 NOTE — Telephone Encounter (Signed)
Received call from [facility]athy [name], [facility].   Reports that patient states that she has increased swelling in her L side of face and near her ear and down her throat.   Denies pain, tenderness, drainage, redness, or fever.   States that occasionally she has swelling in her lymph nodes that MD is aware of.   Advised that if she begins to note any s/sx of illness that she needs to be seen.   MD to be made aware.

## 2013-09-05 NOTE — Telephone Encounter (Signed)
Noted, this is a chronic problem, no abnormal nodes in past

## 2013-09-07 ENCOUNTER — Other Ambulatory Visit: Payer: Self-pay | Admitting: Family Medicine

## 2013-09-08 ENCOUNTER — Telehealth: Payer: Self-pay | Admitting: Family Medicine

## 2013-09-08 NOTE — Telephone Encounter (Signed)
Refill appropriate and filled per protocol. 

## 2013-09-08 NOTE — Telephone Encounter (Signed)
Call placed to patient.   States that she had blister come up on leg and [facility] ER prescribed ABTx.   States that new blister is noted above area where old blister was located and requested ABTx.   MD please advise.

## 2013-09-08 NOTE — Telephone Encounter (Signed)
Please call her [facility] nurse and have them evaulation if she can not come in, It should not need another antibiotic It needs to be seen by someone She can keep clean and covered until then

## 2013-09-08 NOTE — Telephone Encounter (Signed)
Patient is calling to see if we can call in an antibiotic for a spot that has come up on her arm  Cathy [name] apothocary  Her number is [phone]

## 2013-09-08 NOTE — Telephone Encounter (Signed)
Call placed to patient.   States that she would not be able to come in until Monday.   Will call [facility].

## 2013-09-08 NOTE — Telephone Encounter (Signed)
Call placed to patient and patient made aware.   States that she would like PCP to review.

## 2013-09-08 NOTE — Telephone Encounter (Signed)
NTBS.

## 2013-09-09 NOTE — Telephone Encounter (Signed)
Alisa made aware.   Will assess patient and F/U with [facility].

## 2013-09-09 NOTE — Telephone Encounter (Signed)
Noted  

## 2013-09-14 ENCOUNTER — Ambulatory Visit: Payer: Medicare HMO | Admitting: Cardiology

## 2013-09-21 ENCOUNTER — Other Ambulatory Visit (HOSPITAL_COMMUNITY): Payer: Self-pay | Admitting: "Endocrinology

## 2013-09-21 DIAGNOSIS — E041 Nontoxic single thyroid nodule: Secondary | ICD-10-CM

## 2013-09-23 ENCOUNTER — Other Ambulatory Visit: Payer: Self-pay | Admitting: Family Medicine

## 2013-09-23 NOTE — Telephone Encounter (Signed)
Refill appropriate and filled per protocol. 

## 2013-09-29 ENCOUNTER — Telehealth: Payer: Self-pay | Admitting: *Deleted

## 2013-09-29 NOTE — Telephone Encounter (Signed)
Patient called to get Dr. Myrtis Ser opinion about having a biopsy on her goiter by Dr. Fransico Him. Patient wants to know if her cardiologist thinks its okay for her to have this done. Please advise.

## 2013-09-30 ENCOUNTER — Telehealth: Payer: Self-pay | Admitting: *Deleted

## 2013-09-30 ENCOUNTER — Telehealth: Payer: Self-pay | Admitting: Family Medicine

## 2013-09-30 NOTE — Telephone Encounter (Signed)
Call placed to patient. Line busy.

## 2013-09-30 NOTE — Telephone Encounter (Signed)
[phone] Patient is wanting [place] to call in an antibiotic for her neck if possible  [employer]

## 2013-09-30 NOTE — Telephone Encounter (Signed)
Patient called wanting to let Dr. Myrtis Ser know that she has decided to not have the thyroid biopsy done right now.  Informed her to call ordering MD (Nida) to let them know.  Patient verbalized understanding.

## 2013-10-01 NOTE — Telephone Encounter (Signed)
Call placed to patient. No answer. No VM.  

## 2013-10-03 MED ORDER — CEPHALEXIN 500 MG PO CAPS: 500.0000 mg | ORAL_CAPSULE | Freq: Two times a day (BID) | ORAL | Status: DC

## 2013-10-03 NOTE — Telephone Encounter (Signed)
Send in Keflex  BID x 5 days  -, no refills Needs appt if not getting better  Hold macrobid while taking

## 2013-10-03 NOTE — Telephone Encounter (Signed)
Call placed to patient.   Reports that she has a lot of swelling in her neck. States that she has been told that it is her lymph nodes.   Reports that she has excessive swelling and she has lost (3) caps to teeth because of swelling and she has constant dry mouth. Reports that she stays nauseous constantly with vertigo and weakness.    Requested MD call in prescription for ABTx. States that she cannot come to office because she is too sick.   Also states that she is currently on Macrobid  PO QD for UTI prevention.

## 2013-10-03 NOTE — Telephone Encounter (Signed)
Call placed to patient and patient made aware.   Prescription sent to pharmacy.  

## 2013-10-06 ENCOUNTER — Ambulatory Visit (HOSPITAL_COMMUNITY): Admission: RE | Admit: 2013-10-06 | Payer: Medicare HMO | Source: Ambulatory Visit

## 2013-10-06 ENCOUNTER — Other Ambulatory Visit: Payer: Self-pay | Admitting: Family Medicine

## 2013-10-08 NOTE — Telephone Encounter (Signed)
Ok to refill??  Last office visit 08/17/2013.  Last refill 08/31/2013.

## 2013-10-10 ENCOUNTER — Telehealth: Payer: Self-pay | Admitting: Family Medicine

## 2013-10-10 NOTE — Telephone Encounter (Signed)
Patient says that her pharmacy has faxed [place] several times with not response regarding her nausea medication she is out please call her back at [phone]

## 2013-10-10 NOTE — Telephone Encounter (Signed)
MD aware that patient isn't having biopsy.

## 2013-10-10 NOTE — Telephone Encounter (Signed)
Prescription sent to pharmacy on 10/10/2013.  Call placed to patient to make aware.

## 2013-10-10 NOTE — Telephone Encounter (Signed)
okay

## 2013-10-10 NOTE — Telephone Encounter (Signed)
Prescription sent to pharmacy.

## 2013-10-12 ENCOUNTER — Other Ambulatory Visit: Payer: Self-pay | Admitting: *Deleted

## 2013-10-12 MED ORDER — ONETOUCH ULTRA SYSTEM W/DEVICE KIT: PACK | Status: DC

## 2013-10-12 NOTE — Telephone Encounter (Signed)
Received fax requesting prescription for One Touch Ultra Glucometer.   Prescription sent to pharmacy.

## 2013-10-17 ENCOUNTER — Other Ambulatory Visit: Payer: Self-pay | Admitting: Family Medicine

## 2013-10-17 MED ORDER — GLUCOSE BLOOD VI STRP: 1.0000 | ORAL_STRIP | Freq: Three times a day (TID) | Status: DC

## 2013-10-17 NOTE — Telephone Encounter (Signed)
Diabetic test strips sent to patient

## 2013-10-19 ENCOUNTER — Encounter: Payer: Self-pay | Admitting: Family Medicine

## 2013-10-19 ENCOUNTER — Telehealth: Payer: Self-pay | Admitting: *Deleted

## 2013-10-19 ENCOUNTER — Ambulatory Visit (INDEPENDENT_AMBULATORY_CARE_PROVIDER_SITE_OTHER): Payer: Medicare HMO | Admitting: Family Medicine

## 2013-10-19 VITALS — BP 150/84 | HR 98 | Temp 98.3°F | Resp 18

## 2013-10-19 DIAGNOSIS — K044 Acute apical periodontitis of pulpal origin: Secondary | ICD-10-CM | POA: Diagnosis not present

## 2013-10-19 DIAGNOSIS — Z23 Encounter for immunization: Secondary | ICD-10-CM | POA: Diagnosis not present

## 2013-10-19 DIAGNOSIS — M25569 Pain in unspecified knee: Secondary | ICD-10-CM | POA: Diagnosis not present

## 2013-10-19 DIAGNOSIS — W19XXXA Unspecified fall, initial encounter: Secondary | ICD-10-CM | POA: Diagnosis not present

## 2013-10-19 DIAGNOSIS — M25562 Pain in left knee: Secondary | ICD-10-CM

## 2013-10-19 DIAGNOSIS — K047 Periapical abscess without sinus: Secondary | ICD-10-CM

## 2013-10-19 MED ORDER — CLINDAMYCIN HCL 300 MG PO CAPS: 300.0000 mg | ORAL_CAPSULE | Freq: Three times a day (TID) | ORAL | Status: AC

## 2013-10-19 MED ORDER — HYDROCODONE-ACETAMINOPHEN 5-325 MG PO TABS: 1.0000 | ORAL_TABLET | Freq: Four times a day (QID) | ORAL | Status: AC | PRN

## 2013-10-19 NOTE — Telephone Encounter (Signed)
Patient came into office stating that she was told to be here at 3 for an appointment.   Patient triaged and MD to see.   Received [facility] Mar[facility]. Per note, another nurse had stated that appointments were available this morning, but no definite appointment for patient was confirmed. Appointment was given to another patient.   Call placed to [facility] Erielisa Gilboy. Reports that she had called back and left message on VM for other nurse stating that patient could come in at 3pm, but appointment had already been booked at that time.

## 2013-10-19 NOTE — Progress Notes (Signed)
Patient ID: Cathy [name], female   DOB: [birth year], 80 y.o.   MRN: [id number]   Subjective:    Patient ID: Cathy [name], female    DOB: [birth year], 80 y.o.   MRN: [id number]  Patient presents for L side facial pain  Patient here with left facial pain. [facility] called in earlier today please see the v phone calls regarding this. She is to swelling and redness of her face. When actually happened was couple weeks ago she was biting down one her calves became loose and she has some swelling of the gum I sent in Keflex at that time not known exactly what was going on and she was unable to come in for appointment. 2 days ago she did to some crackers and now the Is mostly detached from the root of the gum however her dentist cannot see her until next week and she was told to come to our office for evaluation. She's had increased pain and swelling in her bone long along her face as well as some tenderness in her left lymph node. She's not had any fever or difficulty breathing. She also states that 2 days ago she was bending over to pick up a pillow off the floor when she slid to her bottom which is caused some pain in her left knee as well as her tailbone region. She did not have any significant swelling of either region and no bruising. She does feel a small knot above her knee cap with his hit the floor   Review Of Systems:  GEN- denies fatigue, fever, weight loss,weakness, recent illness HEENT- denies eye drainage, change in vision, nasal discharge, CVS- denies chest pain, palpitations RESP- denies SOB, cough, wheeze ABD- denies N/V, change in stools, abd pain GU- denies dysuria, hematuria, dribbling, incontinence MSK- + joint pain, muscle aches, injury Neuro- denies headache, dizziness, syncope, seizure activity       Objective:    BP 150/84  Pulse 98  Temp(Src) 98.3 F (36.8 C) (Oral)  Resp 18 GEN- NAD, alert and oriented x3, sitting in wheelchair, HEENT- PERRL, EOMI, non  injected sclera, pink conjunctiva, oropharynx clear, Poor dentition swollen GUM, erythema, mild discharge near broken cap left lower gumline Neck- Supple,shotty Left ant LAD CVS- RRR, no murmur RESP-CTAB  ABD-NABS,soft,NT,ND,  EXT- trace edema, venous stasis changes MSK- Lumbar spine NT, mild TTP over tailbone, no ecchymosis or bruising, unable to cause any signficant movement of tailbone,  Left knee- normal inspection, decreased ROM, mild crepitus, mild TTP 2cm above knee Pulses- Radial 2+, DP decreased bilat         Assessment & Plan:      Problem List Items Addressed This Visit   None    Visit Diagnoses   Infected tooth    -  Primary    Treat with clindamycin, no sign of facial cellulitis, appt with dentist next week    Relevant Medications       clindamycin (CLEOCIN) capsule    Knee pain, acute, left        s/p fall, ICE, avoid NSAIDS , high risk for any type of surgical intervention, no steroid due to DM, given Norco    Fall, initial encounter        She refuses to go back to SNF, or ALF, I have discussed that she needs 24 hours care multiple times    Need for prophylactic vaccination and inoculation against influenza        Relevant  Orders       Flu Vaccine QUAD 36+ mos PF IM (Fluarix Quad PF) (Completed)       Note: This dictation was prepared with Dragon dictation along with smaller phrase technology. Any transcriptional errors that result from this process are unintentional.

## 2013-10-19 NOTE — Telephone Encounter (Signed)
Received call from Dahlia BailiffAlisha Gilboy stating that pt is c/o swelling Left side face warm and painful, pt is taking tylenol every 6-8hrs as prn, wants to know if can call in antibiotic, pt is not having any difficulty swallowing,etc, I informed Ms.Gilby that she needed to be seen and had a 2p or 3p opening at the time, Elease Hashimotolisha stated that she will let pt know and see if she can find someone to bring her since she has to give at least a 24 hr notice to [facility] transportation, Elease Hashimotolisha is to call pt and see if can come for appt.

## 2013-10-19 NOTE — Patient Instructions (Signed)
Take pain medication ICE your knee Take your antibiotics as prescribed F/U as previous

## 2013-10-19 NOTE — Telephone Encounter (Signed)
Please call Alisa back regarding pt, she was treated with keflex after she called about 2 weeks ago , something about her teeth and lymph nodes being swollen, I have never seen any significant swelling in the office, she has been seen by ENT and endocrinology and had imaging.  See if she thinks this was a true cellulitis, if so send over Bactrim DS 1 tablet BID for 7 days , make sure she can take sulfa

## 2013-10-20 ENCOUNTER — Encounter: Payer: Self-pay | Admitting: Family Medicine

## 2013-10-23 ENCOUNTER — Inpatient Hospital Stay (HOSPITAL_COMMUNITY)
Admission: EM | Admit: 2013-10-23 | Discharge: 2013-11-20 | DRG: 444 | Disposition: E | Payer: Medicare HMO | Attending: Internal Medicine | Admitting: Internal Medicine

## 2013-10-23 ENCOUNTER — Emergency Department (HOSPITAL_COMMUNITY): Payer: Medicare HMO

## 2013-10-23 ENCOUNTER — Encounter (HOSPITAL_COMMUNITY): Payer: Self-pay | Admitting: Emergency Medicine

## 2013-10-23 DIAGNOSIS — I10 Essential (primary) hypertension: Secondary | ICD-10-CM

## 2013-10-23 DIAGNOSIS — R4701 Aphasia: Secondary | ICD-10-CM | POA: Diagnosis not present

## 2013-10-23 DIAGNOSIS — Z794 Long term (current) use of insulin: Secondary | ICD-10-CM

## 2013-10-23 DIAGNOSIS — B9620 Unspecified Escherichia coli [E. coli] as the cause of diseases classified elsewhere: Secondary | ICD-10-CM

## 2013-10-23 DIAGNOSIS — Z66 Do not resuscitate: Secondary | ICD-10-CM | POA: Diagnosis not present

## 2013-10-23 DIAGNOSIS — Z01818 Encounter for other preprocedural examination: Secondary | ICD-10-CM

## 2013-10-23 DIAGNOSIS — I447 Left bundle-branch block, unspecified: Secondary | ICD-10-CM | POA: Diagnosis present

## 2013-10-23 DIAGNOSIS — R319 Hematuria, unspecified: Secondary | ICD-10-CM

## 2013-10-23 DIAGNOSIS — J9600 Acute respiratory failure, unspecified whether with hypoxia or hypercapnia: Secondary | ICD-10-CM

## 2013-10-23 DIAGNOSIS — Z515 Encounter for palliative care: Secondary | ICD-10-CM

## 2013-10-23 DIAGNOSIS — J9601 Acute respiratory failure with hypoxia: Secondary | ICD-10-CM

## 2013-10-23 DIAGNOSIS — Z955 Presence of coronary angioplasty implant and graft: Secondary | ICD-10-CM | POA: Diagnosis not present

## 2013-10-23 DIAGNOSIS — I480 Paroxysmal atrial fibrillation: Secondary | ICD-10-CM

## 2013-10-23 DIAGNOSIS — I214 Non-ST elevation (NSTEMI) myocardial infarction: Secondary | ICD-10-CM | POA: Diagnosis present

## 2013-10-23 DIAGNOSIS — M19019 Primary osteoarthritis, unspecified shoulder: Secondary | ICD-10-CM | POA: Diagnosis present

## 2013-10-23 DIAGNOSIS — Z6832 Body mass index (BMI) 32.0-32.9, adult: Secondary | ICD-10-CM | POA: Diagnosis not present

## 2013-10-23 DIAGNOSIS — Z91041 Radiographic dye allergy status: Secondary | ICD-10-CM | POA: Diagnosis not present

## 2013-10-23 DIAGNOSIS — I25768 Atherosclerosis of bypass graft of coronary artery of transplanted heart with other forms of angina pectoris: Secondary | ICD-10-CM

## 2013-10-23 DIAGNOSIS — Z79899 Other long term (current) drug therapy: Secondary | ICD-10-CM

## 2013-10-23 DIAGNOSIS — I2510 Atherosclerotic heart disease of native coronary artery without angina pectoris: Secondary | ICD-10-CM | POA: Diagnosis present

## 2013-10-23 DIAGNOSIS — I959 Hypotension, unspecified: Secondary | ICD-10-CM | POA: Diagnosis not present

## 2013-10-23 DIAGNOSIS — I6521 Occlusion and stenosis of right carotid artery: Secondary | ICD-10-CM | POA: Diagnosis present

## 2013-10-23 DIAGNOSIS — R4182 Altered mental status, unspecified: Secondary | ICD-10-CM

## 2013-10-23 DIAGNOSIS — I248 Other forms of acute ischemic heart disease: Secondary | ICD-10-CM

## 2013-10-23 DIAGNOSIS — K8050 Calculus of bile duct without cholangitis or cholecystitis without obstruction: Secondary | ICD-10-CM

## 2013-10-23 DIAGNOSIS — E038 Other specified hypothyroidism: Secondary | ICD-10-CM

## 2013-10-23 DIAGNOSIS — E039 Hypothyroidism, unspecified: Secondary | ICD-10-CM | POA: Diagnosis present

## 2013-10-23 DIAGNOSIS — J9690 Respiratory failure, unspecified, unspecified whether with hypoxia or hypercapnia: Secondary | ICD-10-CM

## 2013-10-23 DIAGNOSIS — K8020 Calculus of gallbladder without cholecystitis without obstruction: Secondary | ICD-10-CM | POA: Diagnosis present

## 2013-10-23 DIAGNOSIS — N189 Chronic kidney disease, unspecified: Secondary | ICD-10-CM | POA: Diagnosis present

## 2013-10-23 DIAGNOSIS — R1310 Dysphagia, unspecified: Secondary | ICD-10-CM | POA: Diagnosis not present

## 2013-10-23 DIAGNOSIS — K831 Obstruction of bile duct: Secondary | ICD-10-CM

## 2013-10-23 DIAGNOSIS — I2489 Other forms of acute ischemic heart disease: Secondary | ICD-10-CM

## 2013-10-23 DIAGNOSIS — I63432 Cerebral infarction due to embolism of left posterior cerebral artery: Secondary | ICD-10-CM | POA: Diagnosis not present

## 2013-10-23 DIAGNOSIS — I639 Cerebral infarction, unspecified: Secondary | ICD-10-CM

## 2013-10-23 DIAGNOSIS — Z789 Other specified health status: Secondary | ICD-10-CM

## 2013-10-23 DIAGNOSIS — E876 Hypokalemia: Secondary | ICD-10-CM | POA: Diagnosis not present

## 2013-10-23 DIAGNOSIS — N179 Acute kidney failure, unspecified: Secondary | ICD-10-CM | POA: Diagnosis present

## 2013-10-23 DIAGNOSIS — E782 Mixed hyperlipidemia: Secondary | ICD-10-CM | POA: Diagnosis present

## 2013-10-23 DIAGNOSIS — R778 Other specified abnormalities of plasma proteins: Secondary | ICD-10-CM

## 2013-10-23 DIAGNOSIS — E1165 Type 2 diabetes mellitus with hyperglycemia: Secondary | ICD-10-CM | POA: Diagnosis present

## 2013-10-23 DIAGNOSIS — E871 Hypo-osmolality and hyponatremia: Secondary | ICD-10-CM | POA: Diagnosis present

## 2013-10-23 DIAGNOSIS — I129 Hypertensive chronic kidney disease with stage 1 through stage 4 chronic kidney disease, or unspecified chronic kidney disease: Secondary | ICD-10-CM | POA: Diagnosis present

## 2013-10-23 DIAGNOSIS — I5042 Chronic combined systolic (congestive) and diastolic (congestive) heart failure: Secondary | ICD-10-CM | POA: Diagnosis present

## 2013-10-23 DIAGNOSIS — K810 Acute cholecystitis: Secondary | ICD-10-CM

## 2013-10-23 DIAGNOSIS — I252 Old myocardial infarction: Secondary | ICD-10-CM | POA: Diagnosis not present

## 2013-10-23 DIAGNOSIS — E781 Pure hyperglyceridemia: Secondary | ICD-10-CM

## 2013-10-23 DIAGNOSIS — K8030 Calculus of bile duct with cholangitis, unspecified, without obstruction: Principal | ICD-10-CM | POA: Diagnosis present

## 2013-10-23 DIAGNOSIS — R7881 Bacteremia: Secondary | ICD-10-CM | POA: Diagnosis present

## 2013-10-23 DIAGNOSIS — E785 Hyperlipidemia, unspecified: Secondary | ICD-10-CM

## 2013-10-23 DIAGNOSIS — N390 Urinary tract infection, site not specified: Secondary | ICD-10-CM | POA: Diagnosis present

## 2013-10-23 DIAGNOSIS — I6340 Cerebral infarction due to embolism of unspecified cerebral artery: Secondary | ICD-10-CM

## 2013-10-23 DIAGNOSIS — Z4659 Encounter for fitting and adjustment of other gastrointestinal appliance and device: Secondary | ICD-10-CM

## 2013-10-23 DIAGNOSIS — R7989 Other specified abnormal findings of blood chemistry: Secondary | ICD-10-CM

## 2013-10-23 DIAGNOSIS — R112 Nausea with vomiting, unspecified: Secondary | ICD-10-CM | POA: Diagnosis present

## 2013-10-23 DIAGNOSIS — I5030 Unspecified diastolic (congestive) heart failure: Secondary | ICD-10-CM

## 2013-10-23 DIAGNOSIS — E870 Hyperosmolality and hypernatremia: Secondary | ICD-10-CM | POA: Diagnosis not present

## 2013-10-23 DIAGNOSIS — I779 Disorder of arteries and arterioles, unspecified: Secondary | ICD-10-CM

## 2013-10-23 DIAGNOSIS — I255 Ischemic cardiomyopathy: Secondary | ICD-10-CM

## 2013-10-23 DIAGNOSIS — R1110 Vomiting, unspecified: Secondary | ICD-10-CM

## 2013-10-23 DIAGNOSIS — I5022 Chronic systolic (congestive) heart failure: Secondary | ICD-10-CM | POA: Diagnosis present

## 2013-10-23 DIAGNOSIS — Z7982 Long term (current) use of aspirin: Secondary | ICD-10-CM | POA: Diagnosis not present

## 2013-10-23 DIAGNOSIS — R739 Hyperglycemia, unspecified: Secondary | ICD-10-CM

## 2013-10-23 DIAGNOSIS — I739 Peripheral vascular disease, unspecified: Secondary | ICD-10-CM

## 2013-10-23 DIAGNOSIS — I729 Aneurysm of unspecified site: Secondary | ICD-10-CM

## 2013-10-23 DIAGNOSIS — Z951 Presence of aortocoronary bypass graft: Secondary | ICD-10-CM

## 2013-10-23 DIAGNOSIS — Z978 Presence of other specified devices: Secondary | ICD-10-CM

## 2013-10-23 DIAGNOSIS — K8309 Other cholangitis: Secondary | ICD-10-CM

## 2013-10-23 DIAGNOSIS — E669 Obesity, unspecified: Secondary | ICD-10-CM | POA: Diagnosis present

## 2013-10-23 DIAGNOSIS — I4891 Unspecified atrial fibrillation: Secondary | ICD-10-CM

## 2013-10-23 DIAGNOSIS — E1149 Type 2 diabetes mellitus with other diabetic neurological complication: Secondary | ICD-10-CM

## 2013-10-23 LAB — URINE MICROSCOPIC-ADD ON

## 2013-10-23 LAB — COMPREHENSIVE METABOLIC PANEL
ALT: 264 U/L — AB (ref 0–35)
ANION GAP: 14 (ref 5–15)
AST: 301 U/L — ABNORMAL HIGH (ref 0–37)
Albumin: 3 g/dL — ABNORMAL LOW (ref 3.5–5.2)
Alkaline Phosphatase: 265 U/L — ABNORMAL HIGH (ref 39–117)
BUN: 20 mg/dL (ref 6–23)
CO2: 27 mEq/L (ref 19–32)
Calcium: 9.4 mg/dL (ref 8.4–10.5)
Chloride: 91 mEq/L — ABNORMAL LOW (ref 96–112)
Creatinine, Ser: 1.16 mg/dL — ABNORMAL HIGH (ref 0.50–1.10)
GFR calc non Af Amer: 43 mL/min — ABNORMAL LOW (ref >90)
GFR, EST AFRICAN AMERICAN: 50 mL/min — AB (ref >90)
GLUCOSE: 462 mg/dL — AB (ref 70–99)
Potassium: 4.5 mEq/L (ref 3.7–5.3)
SODIUM: 132 meq/L — AB (ref 137–147)
TOTAL PROTEIN: 7.4 g/dL (ref 6.0–8.3)
Total Bilirubin: 6.1 mg/dL — ABNORMAL HIGH (ref 0.3–1.2)

## 2013-10-23 LAB — CBG MONITORING, ED
Glucose-Capillary: 438 mg/dL — ABNORMAL HIGH (ref 70–99)
Glucose-Capillary: 396 mg/dL — ABNORMAL HIGH (ref 70–99)
Glucose-Capillary: 395 mg/dL — ABNORMAL HIGH (ref 70–99)

## 2013-10-23 LAB — URINALYSIS, ROUTINE W REFLEX MICROSCOPIC
Glucose, UA: >1000 mg/dL — AB
Ketones, ur: NEGATIVE mg/dL
Leukocytes, UA: NEGATIVE
NITRITE: NEGATIVE
PH: 5.5 (ref 5.0–8.0)
SPECIFIC GRAVITY, URINE: 1.015 (ref 1.005–1.030)
Urobilinogen, UA: 0.2 mg/dL (ref 0.0–1.0)

## 2013-10-23 LAB — CBC WITH DIFFERENTIAL/PLATELET
Basophils Absolute: 0 10*3/uL (ref 0.0–0.1)
Basophils Relative: 0 % (ref 0–1)
EOS ABS: 0 10*3/uL (ref 0.0–0.7)
EOS PCT: 0 % (ref 0–5)
HCT: 40.9 % (ref 36.0–46.0)
Hemoglobin: 13.7 g/dL (ref 12.0–15.0)
LYMPHS ABS: 1.4 10*3/uL (ref 0.7–4.0)
Lymphocytes Relative: 11 % — ABNORMAL LOW (ref 12–46)
MCH: 30.7 pg (ref 26.0–34.0)
MCHC: 33.5 g/dL (ref 30.0–36.0)
MCV: 91.7 fL (ref 78.0–100.0)
Monocytes Absolute: 1.1 10*3/uL — ABNORMAL HIGH (ref 0.1–1.0)
Monocytes Relative: 9 % (ref 3–12)
Neutro Abs: 10.1 10*3/uL — ABNORMAL HIGH (ref 1.7–7.7)
Neutrophils Relative %: 80 % — ABNORMAL HIGH (ref 43–77)
PLATELETS: 213 10*3/uL (ref 150–400)
RBC: 4.46 MIL/uL (ref 3.87–5.11)
RDW: 15.4 % (ref 11.5–15.5)
WBC: 12.6 10*3/uL — ABNORMAL HIGH (ref 4.0–10.5)

## 2013-10-23 LAB — TROPONIN I
TROPONIN I: 0.36 ng/mL — AB (ref <0.30)
Troponin I: 0.41 ng/mL (ref <0.30)

## 2013-10-23 LAB — PRO B NATRIURETIC PEPTIDE: Pro B Natriuretic peptide (BNP): 2918 pg/mL — ABNORMAL HIGH (ref 0–450)

## 2013-10-23 LAB — LACTIC ACID, PLASMA: Lactic Acid, Venous: 3.7 mmol/L — ABNORMAL HIGH (ref 0.5–2.2)

## 2013-10-23 MED ORDER — ONDANSETRON HCL 4 MG/2ML IJ SOLN
INTRAMUSCULAR | Status: AC
  Filled 2013-10-23: qty 2

## 2013-10-23 MED ORDER — SODIUM CHLORIDE 0.9 % IV SOLN
INTRAVENOUS | Status: DC
Start: 2013-10-24 — End: 2013-10-23

## 2013-10-23 MED ORDER — ONDANSETRON HCL 4 MG/2ML IJ SOLN
4.0000 mg | Freq: Once | INTRAMUSCULAR | Status: AC
  Administered 2013-10-23: 4 mg via INTRAVENOUS
  Filled 2013-10-23: qty 2

## 2013-10-23 MED ORDER — ASPIRIN 81 MG PO CHEW
324.0000 mg | CHEWABLE_TABLET | Freq: Once | ORAL | Status: AC
  Administered 2013-10-23: 324 mg via ORAL
  Filled 2013-10-23: qty 4

## 2013-10-23 MED ORDER — INSULIN ASPART 100 UNIT/ML ~~LOC~~ SOLN
10.0000 [IU] | Freq: Once | SUBCUTANEOUS | Status: AC
  Administered 2013-10-23: 10 [IU] via INTRAVENOUS
  Filled 2013-10-23: qty 1

## 2013-10-23 MED ORDER — ONDANSETRON HCL 4 MG/2ML IJ SOLN
4.0000 mg | Freq: Once | INTRAMUSCULAR | Status: AC
  Administered 2013-10-23: 4 mg via INTRAVENOUS

## 2013-10-23 MED ORDER — SODIUM CHLORIDE 0.9 % IV SOLN
INTRAVENOUS | Status: DC
  Administered 2013-10-23 – 2013-10-27 (×5): via INTRAVENOUS

## 2013-10-23 MED ORDER — DEXTROSE 5 % IV SOLN
1.0000 g | Freq: Once | INTRAVENOUS | Status: AC
  Administered 2013-10-23: 1 g via INTRAVENOUS
  Filled 2013-10-23: qty 10

## 2013-10-23 MED ORDER — SODIUM CHLORIDE 0.9 % IV BOLUS (SEPSIS)
250.0000 mL | Freq: Once | INTRAVENOUS | Status: AC
  Administered 2013-10-23: 250 mL via INTRAVENOUS

## 2013-10-23 MED ORDER — SODIUM CHLORIDE 0.9 % IV SOLN
INTRAVENOUS | Status: AC
Start: 2013-10-23 — End: 2013-10-24

## 2013-10-23 NOTE — ED Notes (Signed)
Pt able to turn herself in bed. Pt has increased shortness of breath with any movement in bed as well as with standing.

## 2013-10-23 NOTE — ED Notes (Addendum)
PT has recently been tx for a dental infection with an antibiotic and pt c/o general mailise and high blood sugars. PT took 16units of humalog approx 1600 this pm. PT also c/o severe nausea.

## 2013-10-23 NOTE — ED Notes (Signed)
Pt up to [facility] to void.

## 2013-10-23 NOTE — ED Provider Notes (Signed)
CSN: [id number]     Arrival date & time 11/09/2013  1719 History  This chart was scribed for Cathy Mulders, MD by Richarda Overlie, ED Scribe. This patient was seen in room APA05/APA05 and the patient's care was started 5:41 PM.    Chief Complaint  Patient presents with  . Hyperglycemia     The history is provided by the patient. No language interpreter was used.   HPI Comments: Cathy [name] is a 80 y.o. female with ah history of CAD and insulin dependent DM who presents to the Emergency Department complaining of constant, gradually worsening nausea that started yesterday. She reports that she vomited once last night. Patient reports she was at Dr. Deirdre Peer office 2 days ago for left lower tooth pain and was prescribed clindamycin and hydrocodone for her pain. She reports the last time she took her hydrocodone was 7PM last night. She denies any fevers or chills.    PCP [place]   Past Medical History  Diagnosis Date  . Hypertension     Unspecified  . Hyperlipidemia     Mixed, statin intolerance.  [name] CAD (coronary artery disease)     a. CAD s/p CABG 2003. b. Cath 2006: occluded OM vein graft. c. Anterior MI 04/15/12 s/p PTCA only to ramus (stenting unsuccessful at that time) d. NSTEMI 06/2012: failed med rx, s/p DES to ramus c/b vessel perforation tx with balloon/stenting; e. 02/1013 Cath: LM nl, LAD mod dzs mid, LIMA ok, LCX 151m, OM1 patent stent, VG->OM 100, RCA 100p, VG->PDA ok, EF 40-45%->Med Rx.  . S/P CABG (coronary artery bypass graft) 2003     2003  LIMA, LAD, SVG to the OM, SVG right coronary artery  . Thoracic ascending aortic aneurysm 2003    Ascending thoracic aneurysm repair with a graft at time of CABG  . Diabetes mellitus     Insulin dependent  . Gastroparesis   . Depression   . Allergy history, radiographic dye     Contrast dye allergy  . Previous back surgery   . Syncope 2008    Question?  2008  . Chronic systolic heart failure   . Ischemic cardiomyopathy 06/2011     a. Echo 04/19/12: EF 25-30%, mod LVH, diffuse HK, trivial AI  . Hypotension     October, 2013  . Hypothyroidism     Treated with low-dose Synthroid  . CKD (chronic kidney disease)   . Carotid artery disease     a. 80-99% prox RICA stenosis - with recent cardiac event 06/2012, favor waiting at least 1 month, optimally 3 months post PCI.  [name] Hypertriglyceridemia   . Statin intolerance   . Peripheral neuropathy   . PAF (paroxysmal atrial fibrillation)     a. Noted during 06/2012 hospitalization. Placed on amiodarone. Plan is for event monitor to assess for breakthrough, hold off anticoag unless additional AF seen.  [name] NSVT (nonsustained ventricular tachycardia)     a. Noted during 06/2012 hospitalization.  . Plavix Non-responder     a. Also allergic to brilinta.   Past Surgical History  Procedure Laterality Date  . Coronary artery bypass graft  05/05/2001     LIMA, LAD, SVG to the OM, SVG right coronary artery  . Bladder tacking    . Salivary gland surgery    . Total abdominal hysterectomy    . Back surgery    . Breast surgery    . External ear surgery    . Cardiac catheterization  04/15/12, 07/05/12  60-70% proximal LAD, 95% mid AV groove circumflex s/p PTCA, proximal RCA occlusion, patent LIMA to LAD, patent SVG to distal RCA, chronically occluded SVG to circumflex; LVEF 25-30%  . Ercp N/A 11/04/2012    Procedure: ENDOSCOPIC RETROGRADE CHOLANGIOPANCREATOGRAPHY (ERCP);  Surgeon: Petra KubaMarc E Magod, MD;  [facility];  Service: Endoscopy;  Laterality: N/A;  . Cataract extraction w/phaco Right 05/31/2013    Procedure: CATARACT EXTRACTION PHACO AND INTRAOCULAR LENS PLACEMENT (IOC);  Surgeon: Loraine LericheMark T. Nile RiggsShapiro, MD;  Location: [facility];  Service: Ophthalmology;  Laterality: Right;  CDE: 26.18   . Cataract extraction w/phaco Left 06/14/2013    Procedure: CATARACT EXTRACTION PHACO AND INTRAOCULAR LENS PLACEMENT (IOC);  Surgeon: Loraine LericheMark T. Nile RiggsShapiro, MD;  Location: [facility];  Service: Ophthalmology;  Laterality:  Left;  CDE:11.35   Family History  Problem Relation Age of Onset  . Heart disease Other   . Heart disease Mother   . Hypertension Mother   . Heart disease Father   . Hypertension Father    History  Substance Use Topics  . Smoking status: Never Smoker   . Smokeless tobacco: Never Used  . Alcohol Use: No   OB History   Grav Para Term Preterm Abortions TAB SAB Ect Mult Living                 Review of Systems  Constitutional: Positive for chills. Negative for fever.  HENT: Positive for dental problem. Negative for rhinorrhea and sore throat.   Eyes: Negative for visual disturbance.  Respiratory: Positive for shortness of breath. Negative for cough.   Cardiovascular: Positive for chest pain. Negative for leg swelling.  Gastrointestinal: Positive for nausea and vomiting. Negative for abdominal pain and diarrhea.  Genitourinary: Positive for dysuria.  Musculoskeletal: Positive for back pain and neck pain.  Skin: Positive for rash.  Neurological: Negative for headaches.  Hematological: Does not bruise/bleed easily.  Psychiatric/Behavioral: Negative for confusion.      Allergies  Amiodarone; Brilinta; Iohexol; Atorvastatin; Citrus; Contrast media; Plavix; Zolpidem tartrate; and Milk-related compounds  Home Medications   Prior to Admission medications   Medication Sig Start Date End Date Taking? Authorizing Provider  acetaminophen (TYLENOL) 500 MG tablet Take 1 tablet (500 mg total) by mouth every 6 (six) hours as needed for mild pain. 07/28/13  Yes Standley Brookinganiel P Goodrich, MD  aspirin EC 81 MG tablet Take 81 mg by mouth every morning. 04/28/12  Yes Eugene C Serpe, PA-C  carvedilol (COREG) 12.5 MG tablet Take 1 tablet (12.5 mg total) by mouth 2 (two) times daily. 07/25/13  Yes Luis AbedJeffrey D Katz, MD  cetirizine (ZYRTEC) 10 MG tablet Take 10 mg by mouth daily.   Yes Historical Provider, MD  clindamycin (CLEOCIN) 300 MG capsule Take 1 capsule (300 mg total) by mouth 3 (three) times daily.  10/19/13  Yes Salley ScarletKawanta F [place], MD  Cyanocobalamin (VITAMIN B 12 PO) Take 1 tablet by mouth daily.   Yes Historical Provider, MD  folic acid (FOLVITE) 400 MCG tablet Take 400 mcg by mouth daily.   Yes Historical Provider, MD  furosemide (LASIX) 40 MG tablet Take 40 mg by mouth daily.   Yes Historical Provider, MD  HYDROcodone-acetaminophen (NORCO) 5-325 MG per tablet Take 1 tablet by mouth every 6 (six) hours as needed for moderate pain. 10/19/13  Yes Salley ScarletKawanta F [place], MD  insulin glargine (LANTUS) 100 UNIT/ML injection Inject 70 Units into the skin at bedtime.  06/06/13  Yes Salley ScarletKawanta F [place], MD  insulin lispro (HUMALOG) 100 UNIT/ML  injection Inject 16-20 Units into the skin 4 (four) times daily as needed for high blood sugar. Sliding scale as before admission to the hospital. 06/08/13  Yes Salley Scarlet, MD  levothyroxine (SYNTHROID, LEVOTHROID) 25 MCG tablet Take 1 tablet (25 mcg total) by mouth daily before breakfast. 03/21/13  Yes Salley Scarlet, MD  meclizine (ANTIVERT) 25 MG tablet Take 25 mg by mouth 2 (two) times daily.   Yes Historical Provider, MD  nitroGLYCERIN (NITROSTAT) 0.4 MG SL tablet Place 1 tablet (0.4 mg total) under the tongue every 5 (five) minutes as needed for chest pain. May repeat times three. 03/21/13  Yes Salley Scarlet, MD  ondansetron (ZOFRAN-ODT) 4 MG disintegrating tablet Take 4 mg by mouth every 8 (eight) hours as needed for nausea or vomiting.   Yes Historical Provider, MD  tamsulosin (FLOMAX) 0.4 MG CAPS capsule Take 0.4 mg by mouth daily.   Yes Historical Provider, MD   BP 129/68  Pulse 90  Temp(Src) 99.2 F (37.3 C) (Oral)  Resp 22  Ht 5\' 5"  (1.651 m)  Wt 198 lb (89.812 kg)  BMI 32.95 kg/m2  SpO2 98%  Physical Exam  Nursing note and vitals reviewed. Constitutional: She is oriented to person, place, and time. She appears well-developed and well-nourished.  HENT:  Head: Normocephalic and atraumatic.  Mucous membranes dry  Cardiovascular: Normal rate,  regular rhythm and normal heart sounds.   Pulmonary/Chest: Effort normal and breath sounds normal. No respiratory distress. She has no wheezes. She has no rales.  Abdominal: Soft. Bowel sounds are normal. She exhibits no distension. There is no tenderness.  Musculoskeletal: Normal range of motion. She exhibits no edema.  Neurological: She is alert and oriented to person, place, and time.  Skin: Skin is warm and dry.  Psychiatric: She has a normal mood and affect.    ED Course  Procedures  DIAGNOSTIC STUDIES: Oxygen Saturation is 98% on [place] 2L, normal by my interpretation.    COORDINATION OF CARE: 5:54 PM Discussed treatment plan with pt at bedside and pt agreed to plan.   Labs Review Labs Reviewed  CBC WITH DIFFERENTIAL - Abnormal; Notable for the following:    WBC 12.6 (*)    Neutrophils Relative % 80 (*)    Neutro Abs 10.1 (*)    Lymphocytes Relative 11 (*)    Monocytes Absolute 1.1 (*)    All other components within normal limits  COMPREHENSIVE METABOLIC PANEL - Abnormal; Notable for the following:    Sodium 132 (*)    Chloride 91 (*)    Glucose, Bld 462 (*)    Creatinine, Ser 1.16 (*)    Albumin 3.0 (*)    AST 301 (*)    ALT 264 (*)    Alkaline Phosphatase 265 (*)    Total Bilirubin 6.1 (*)    GFR calc non Af Amer 43 (*)    GFR calc Af Amer 50 (*)    All other components within normal limits  PRO B NATRIURETIC PEPTIDE - Abnormal; Notable for the following:    Pro B Natriuretic peptide (BNP) 2918.0 (*)    All other components within normal limits  TROPONIN I - Abnormal; Notable for the following:    Troponin I 0.41 (*)    All other components within normal limits  LACTIC ACID, PLASMA - Abnormal; Notable for the following:    Lactic Acid, Venous 3.7 (*)    All other components within normal limits  TROPONIN I - Abnormal; Notable  for the following:    Troponin I 0.36 (*)    All other components within normal limits  URINALYSIS, ROUTINE W REFLEX MICROSCOPIC -  Abnormal; Notable for the following:    Glucose, UA >1000 (*)    Hgb urine dipstick SMALL (*)    Bilirubin Urine MODERATE (*)    Protein, ur TRACE (*)    All other components within normal limits  URINE MICROSCOPIC-ADD ON - Abnormal; Notable for the following:    Squamous Epithelial / LPF FEW (*)    All other components within normal limits  CBG MONITORING, ED - Abnormal; Notable for the following:    Glucose-Capillary 438 (*)    All other components within normal limits  CBG MONITORING, ED - Abnormal; Notable for the following:    Glucose-Capillary 396 (*)    All other components within normal limits  CBG MONITORING, ED - Abnormal; Notable for the following:    Glucose-Capillary 395 (*)    All other components within normal limits  CULTURE, BLOOD (ROUTINE X 2)  CULTURE, BLOOD (ROUTINE X 2)  URINE CULTURE    Imaging Review Dg Chest 2 View  11/01/2013   CLINICAL DATA:  Pt c/o sob, cough today, states that her blood sugar was nearly 500 when EMS arrived at her home. C/o weakness, lethargy, unable to stand.  EXAM: CHEST  2 VIEW  COMPARISON:  08/11/2013  FINDINGS: Prior median sternotomy. Numerous leads and wires project over the chest. Bilateral glenohumeral joint osteoarthritis. Midline trachea. Moderate cardiomegaly. Mild right hemidiaphragm elevation. No pleural effusion or pneumothorax. No congestive failure. Mildly low lung volumes. Clear lungs.  IMPRESSION: Cardiomegaly and mildly low lung volumes, without acute disease.   Electronically Signed   By: Jeronimo Greaves M.D.   On: 11/15/2013 18:20     EKG Interpretation   Date/Time:  Sunday October 23 2013 17:26:47 EDT Ventricular Rate:  85 PR Interval:  168 QRS Duration: 134 QT Interval:  408 QTC Calculation: 485 R Axis:   -47 Text Interpretation:  Sinus rhythm Left bundle branch block Baseline  wander in lead(s) V6 R wave progressin changes lateral compared to old  Confirmed by Yamaira Spinner  MD, Nhyira Leano [id number]) on 11/17/2013 5:31:51  PM      MDM   Final diagnoses:  Hyperglycemia  Hematuria    Patient with multiple findings in the lab workup. First and foremost hyperglycemia without evidence of acidosis. Blood sugars improving with IV insulin. It came from upper 400s down to upper 300s with 10 units patient will receive another 10 units of regular insulin. Not started on glucose stabilizer because no acidosis.  Patient's chest x-ray negative for pneumonia.  Patient's urinalysis too numerous to count red blood cells consistent with her mid hematuria. No significant white cells patient is on suppressive Macrodantin therapy for recurrent UTIs. Culture done may represent UTI patient given 1 g of Rocephin.  Patient's lactic acid was elevated above to less than 4 not exactly clear why this is present. Patient does not have vital signs consistent with a early septic picture. Not tachycardic not febrile not hypotensive. Patient did have blood cultures done as well as urine culture.  EKG shows a left bundle branch block that has been present in the past. Patient's troponin was elevated but is now decreasing. Patient denied any significant chest pain however she has had the persistent nausea. It is possible that there will is a silent or missed MI. Making team aware of that.   I personally performed the  services described in this documentation, which was scribed in my presence. The recorded information has been reviewed and is accurate.      Cathy Mulders, MD November 15, 2013 2329

## 2013-10-23 NOTE — ED Notes (Signed)
Pt states she became more nauseated after getting up to the bedside commode. Pt states little better after getting back into the bed.

## 2013-10-23 NOTE — ED Notes (Signed)
CRITICAL VALUE ALERT  Critical value received:  Troponin 0.41  Date of notification:  11/14/2013  Time of notification:  1912  Critical value read back:Yes.    Nurse who received alert:  Kathlene CoteJamie Alexsander Cavins, RN  MD notified (1st page):  Dr. Saul FordyceZacowski  Time of first page:  1912

## 2013-10-23 NOTE — H&P (Signed)
Cathy [name] is an 80 y.o. female.    Pcp: Dr. Buelah Manis  Chief Complaint: nausea/vomitting HPI: 80 yo female with CAD s/p CABG, Dm2 apparently c/o nausea and vomitting and then found to have hyperglycemia with bs=462.  EKG showed st depression in the lateral leads, with slight troponin leak. CXR negative for any acute process.   Pt doesn't c/o chest pain.  Pt denies fever, chills, cough, diarrhea, brbpr, black stool.  Pt to be admitted for n/v, nstemi.    Past Medical History  Diagnosis Date  . Hypertension     Unspecified  . Hyperlipidemia     Mixed, statin intolerance.  [name] CAD (coronary artery disease)     a. CAD s/p CABG 2003. b. Cath 2006: occluded OM vein graft. c. Anterior MI 04/15/12 s/p PTCA only to ramus (stenting unsuccessful at that time) d. NSTEMI 06/2012: failed med rx, s/p DES to ramus c/b vessel perforation tx with balloon/stenting; e. 02/1013 Cath: LM nl, LAD mod dzs mid, LIMA ok, LCX 12m, OM1 patent stent, VG->OM 100, RCA 100p, VG->PDA ok, EF 40-45%->Med Rx.  . S/P CABG (coronary artery bypass graft) 2003     2003  LIMA, LAD, SVG to the OM, SVG right coronary artery  . Thoracic ascending aortic aneurysm 2003    Ascending thoracic aneurysm repair with a graft at time of CABG  . Diabetes mellitus     Insulin dependent  . Gastroparesis   . Depression   . Allergy history, radiographic dye     Contrast dye allergy  . Previous back surgery   . Syncope 2008    Question?  2008  . Chronic systolic heart failure   . Ischemic cardiomyopathy 06/2011    a. Echo 04/19/12: EF 25-30%, mod LVH, diffuse HK, trivial AI  . Hypotension     October, 2013  . Hypothyroidism     Treated with low-dose Synthroid  . CKD (chronic kidney disease)   . Carotid artery disease     a. 80-99% prox RICA stenosis - with recent cardiac event 06/2012, favor waiting at least 1 month, optimally 3 months post PCI.  [name] Hypertriglyceridemia   . Statin intolerance   . Peripheral neuropathy   . PAF (paroxysmal  atrial fibrillation)     a. Noted during 06/2012 hospitalization. Placed on amiodarone. Plan is for event monitor to assess for breakthrough, hold off anticoag unless additional AF seen.  [name] NSVT (nonsustained ventricular tachycardia)     a. Noted during 06/2012 hospitalization.  . Plavix Non-responder     a. Also allergic to brilinta.    Past Surgical History  Procedure Laterality Date  . Coronary artery bypass graft  05/05/2001     LIMA, LAD, SVG to the OM, SVG right coronary artery  . Bladder tacking    . Salivary gland surgery    . Total abdominal hysterectomy    . Back surgery    . Breast surgery    . External ear surgery    . Cardiac catheterization  04/15/12, 07/05/12    60-70% proximal LAD, 95% mid AV groove circumflex s/p PTCA, proximal RCA occlusion, patent LIMA to LAD, patent SVG to distal RCA, chronically occluded SVG to circumflex; LVEF 25-30%  . Ercp N/A 11/04/2012    Procedure: ENDOSCOPIC RETROGRADE CHOLANGIOPANCREATOGRAPHY (ERCP);  Surgeon: Jeryl Columbia, MD;  Location: [place];  Service: Endoscopy;  Laterality: N/A;  . Cataract extraction w/phaco Right 05/31/2013    Procedure: CATARACT EXTRACTION PHACO AND INTRAOCULAR LENS PLACEMENT (IOC);  Surgeon: Elta Guadeloupe T. Gershon Crane, MD;  Location: [facility];  Service: Ophthalmology;  Laterality: Right;  CDE: 26.18   . Cataract extraction w/phaco Left 06/14/2013    Procedure: CATARACT EXTRACTION PHACO AND INTRAOCULAR LENS PLACEMENT (IOC);  Surgeon: Elta Guadeloupe T. Gershon Crane, MD;  Location: [facility];  Service: Ophthalmology;  Laterality: Left;  CDE:11.35    Family History  Problem Relation Age of Onset  . Heart disease Other   . Heart disease Mother   . Hypertension Mother   . Heart disease Father   . Hypertension Father    Social History:  reports that she has never smoked. She has never used smokeless tobacco. She reports that she does not drink alcohol or use illicit drugs.  Allergies:  Allergies  Allergen Reactions  . Amiodarone Shortness Of Breath   . Brilinta [Ticagrelor] Shortness Of Breath and Nausea And Vomiting  . Iohexol Anaphylaxis       . Atorvastatin Other (See Comments)    Unknown-patient admitted to hospital after taking  . Citrus Dermatitis    BREAK OUT ON BODY  . Contrast Media [Iodinated Diagnostic Agents] Other (See Comments)    Passed out  . Plavix [Clopidogrel Bisulfate]     Not an allergy - but PRU was drawn 06/2012 indicating Plavix hyporesponsiveness, thus she was changed to [place].  . Zolpidem Tartrate Other (See Comments)    Hallucinations  . Milk-Related Compounds Rash     (Not in a hospital admission)  Results for orders placed during the hospital encounter of 11/11/2013 (from the past 48 hour(s))  CBG MONITORING, ED     Status: Abnormal   Collection Time    11/18/2013  5:24 PM      Result Value Ref Range   Glucose-Capillary 438 (*) 70 - 99 mg/dL  CBC WITH DIFFERENTIAL     Status: Abnormal   Collection Time    11/08/2013  5:33 PM      Result Value Ref Range   WBC 12.6 (*) 4.0 - 10.5 K/uL   RBC 4.46  3.87 - 5.11 MIL/uL   Hemoglobin 13.7  12.0 - 15.0 g/dL   HCT 40.9  36.0 - 46.0 %   MCV 91.7  78.0 - 100.0 fL   MCH 30.7  26.0 - 34.0 pg   MCHC 33.5  30.0 - 36.0 g/dL   RDW 15.4  11.5 - 15.5 %   Platelets 213  150 - 400 K/uL   Neutrophils Relative % 80 (*) 43 - 77 %   Neutro Abs 10.1 (*) 1.7 - 7.7 K/uL   Lymphocytes Relative 11 (*) 12 - 46 %   Lymphs Abs 1.4  0.7 - 4.0 K/uL   Monocytes Relative 9  3 - 12 %   Monocytes Absolute 1.1 (*) 0.1 - 1.0 K/uL   Eosinophils Relative 0  0 - 5 %   Eosinophils Absolute 0.0  0.0 - 0.7 K/uL   Basophils Relative 0  0 - 1 %   Basophils Absolute 0.0  0.0 - 0.1 K/uL  COMPREHENSIVE METABOLIC PANEL     Status: Abnormal   Collection Time    10/22/2013  5:33 PM      Result Value Ref Range   Sodium 132 (*) 137 - 147 mEq/L   Potassium 4.5  3.7 - 5.3 mEq/L   Chloride 91 (*) 96 - 112 mEq/L   CO2 27  19 - 32 mEq/L   Glucose, Bld 462 (*) 70 - 99 mg/dL   BUN 20  6 -  23  mg/dL   Creatinine, Ser 1.16 (*) 0.50 - 1.10 mg/dL   Calcium 9.4  8.4 - 10.5 mg/dL   Total Protein 7.4  6.0 - 8.3 g/dL   Albumin 3.0 (*) 3.5 - 5.2 g/dL   AST 301 (*) 0 - 37 U/L   ALT 264 (*) 0 - 35 U/L   Alkaline Phosphatase 265 (*) 39 - 117 U/L   Total Bilirubin 6.1 (*) 0.3 - 1.2 mg/dL   GFR calc non Af Amer 43 (*) >90 mL/min   GFR calc Af Amer 50 (*) >90 mL/min   Comment: (NOTE)     The eGFR has been calculated using the CKD EPI equation.     This calculation has not been validated in all clinical situations.     eGFR's persistently <90 mL/min signify possible Chronic Kidney     Disease.   Anion gap 14  5 - 15  PRO B NATRIURETIC PEPTIDE     Status: Abnormal   Collection Time    11/17/2013  5:57 PM      Result Value Ref Range   Pro B Natriuretic peptide (BNP) 2918.0 (*) 0 - 450 pg/mL  TROPONIN I     Status: Abnormal   Collection Time    11/12/2013  5:57 PM      Result Value Ref Range   Troponin I 0.41 (*) <0.30 ng/mL   Comment: CRITICAL RESULT CALLED TO, READ BACK BY AND VERIFIED WITH:     ARMSTRONG,J AT 7:10PM ON 11/05/2013 BY FESTERMAN,C                Due to the release kinetics of cTnI,     a negative result within the first hours     of the onset of symptoms does not rule out     myocardial infarction with certainty.     If myocardial infarction is still suspected,     repeat the test at appropriate intervals.  LACTIC ACID, PLASMA     Status: Abnormal   Collection Time    11/15/2013  7:22 PM      Result Value Ref Range   Lactic Acid, Venous 3.7 (*) 0.5 - 2.2 mmol/L  CBG MONITORING, ED     Status: Abnormal   Collection Time    10/25/2013  7:47 PM      Result Value Ref Range   Glucose-Capillary 396 (*) 70 - 99 mg/dL  URINALYSIS, ROUTINE W REFLEX MICROSCOPIC     Status: Abnormal   Collection Time    11/01/2013  8:05 PM      Result Value Ref Range   Color, Urine YELLOW  YELLOW   APPearance CLEAR  CLEAR   Specific Gravity, Urine 1.015  1.005 - 1.030   pH 5.5  5.0 - 8.0    Glucose, UA >1000 (*) NEGATIVE mg/dL   Hgb urine dipstick SMALL (*) NEGATIVE   Bilirubin Urine MODERATE (*) NEGATIVE   Ketones, ur NEGATIVE  NEGATIVE mg/dL   Protein, ur TRACE (*) NEGATIVE mg/dL   Urobilinogen, UA 0.2  0.0 - 1.0 mg/dL   Nitrite NEGATIVE  NEGATIVE   Leukocytes, UA NEGATIVE  NEGATIVE  URINE MICROSCOPIC-ADD ON     Status: Abnormal   Collection Time    10/30/2013  8:05 PM      Result Value Ref Range   Squamous Epithelial / LPF FEW (*) RARE   WBC, UA 3-6  <3 WBC/hpf   RBC / HPF TOO NUMEROUS TO COUNT  <  3 RBC/hpf   Urine-Other YEAST    CULTURE, BLOOD (ROUTINE X 2)     Status: None   Collection Time    10/22/2013  9:12 PM      Result Value Ref Range   Specimen Description Blood LEFT ANTECUBITAL     Special Requests BOTTLES DRAWN AEROBIC ONLY 6CC     Culture PENDING     Report Status PENDING    CULTURE, BLOOD (ROUTINE X 2)     Status: None   Collection Time    10/25/2013  9:18 PM      Result Value Ref Range   Specimen Description Blood BLOOD LEFT HAND     Special Requests BOTTLES DRAWN AEROBIC AND ANAEROBIC 6CC     Culture PENDING     Report Status PENDING    TROPONIN I     Status: Abnormal   Collection Time    10/30/2013  9:18 PM      Result Value Ref Range   Troponin I 0.36 (*) <0.30 ng/mL   Comment: CRITICAL VALUE NOTED.  VALUE IS CONSISTENT WITH PREVIOUSLY REPORTED AND CALLED VALUE.                Due to the release kinetics of cTnI,     a negative result within the first hours     of the onset of symptoms does not rule out     myocardial infarction with certainty.     If myocardial infarction is still suspected,     repeat the test at appropriate intervals.  CBG MONITORING, ED     Status: Abnormal   Collection Time    11/10/2013 11:00 PM      Result Value Ref Range   Glucose-Capillary 395 (*) 70 - 99 mg/dL   Comment 1 Documented in Chart     Dg Chest 2 View  10/28/2013   CLINICAL DATA:  Pt c/o sob, cough today, states that her blood sugar was nearly 500 when  EMS arrived at her home. C/o weakness, lethargy, unable to stand.  EXAM: CHEST  2 VIEW  COMPARISON:  08/11/2013  FINDINGS: Prior median sternotomy. Numerous leads and wires project over the chest. Bilateral glenohumeral joint osteoarthritis. Midline trachea. Moderate cardiomegaly. Mild right hemidiaphragm elevation. No pleural effusion or pneumothorax. No congestive failure. Mildly low lung volumes. Clear lungs.  IMPRESSION: Cardiomegaly and mildly low lung volumes, without acute disease.   Electronically Signed   By: Abigail Miyamoto M.D.   On: 11/13/2013 18:20    Review of Systems  Constitutional: Negative.   HENT: Negative.   Eyes: Negative.   Respiratory: Negative.   Cardiovascular: Negative.   Gastrointestinal: Positive for nausea and vomiting. Negative for heartburn, abdominal pain, diarrhea, constipation, blood in stool and melena.  Genitourinary: Positive for dysuria and frequency. Negative for urgency.  Musculoskeletal: Negative.   Skin: Negative.   Neurological: Negative.   Endo/Heme/Allergies: Negative.   Psychiatric/Behavioral: Negative.     Blood pressure 125/59, pulse 89, temperature 99.2 F (37.3 C), temperature source Oral, resp. rate 22, height $RemoveBe'5\' 5"'eupybqmML$  (1.651 m), weight 89.812 kg (198 lb), SpO2 97.00%. Physical Exam  Constitutional: She is oriented to person, place, and time. She appears well-developed and well-nourished.  HENT:  Head: Normocephalic and atraumatic.  Eyes: Conjunctivae are normal. Pupils are equal, round, and reactive to light.  Neck: Normal range of motion. Neck supple.  Cardiovascular: Normal rate and regular rhythm.  Exam reveals no gallop and no friction rub.   No murmur  heard. Respiratory: Effort normal and breath sounds normal.  GI: Soft. Bowel sounds are normal. She exhibits no distension. There is no tenderness. There is no guarding.  Musculoskeletal: Normal range of motion.  Neurological: She is alert and oriented to person, place, and time. She has  normal reflexes.  Skin: Skin is warm and dry.  Psychiatric: She has a normal mood and affect. Her behavior is normal. Judgment and thought content normal.     Assessment/Plan N/v:  zofran $Remov'4mg'TJHVIx$  iv q6h prn NSTEMI:  Lovenox, aspirin, unable to tolerate statin, cont carvedilol, check echo ARF:  Urine sodium, creatinine eosinophils, hydrate with ns iv, consider renal ultrasound if not improving with hydration Hyperglycemia: ISS Hyponatremia:  Hydrate with ns iv  Babak Lucus 11/02/2013, 11:58 PM

## 2013-10-24 ENCOUNTER — Encounter (HOSPITAL_COMMUNITY): Payer: Self-pay | Admitting: Internal Medicine

## 2013-10-24 ENCOUNTER — Inpatient Hospital Stay (HOSPITAL_COMMUNITY): Payer: Medicare HMO

## 2013-10-24 DIAGNOSIS — I10 Essential (primary) hypertension: Secondary | ICD-10-CM

## 2013-10-24 DIAGNOSIS — I059 Rheumatic mitral valve disease, unspecified: Secondary | ICD-10-CM

## 2013-10-24 DIAGNOSIS — E1140 Type 2 diabetes mellitus with diabetic neuropathy, unspecified: Secondary | ICD-10-CM

## 2013-10-24 DIAGNOSIS — R7989 Other specified abnormal findings of blood chemistry: Secondary | ICD-10-CM

## 2013-10-24 DIAGNOSIS — R778 Other specified abnormalities of plasma proteins: Secondary | ICD-10-CM | POA: Diagnosis present

## 2013-10-24 DIAGNOSIS — I248 Other forms of acute ischemic heart disease: Secondary | ICD-10-CM

## 2013-10-24 DIAGNOSIS — I5022 Chronic systolic (congestive) heart failure: Secondary | ICD-10-CM

## 2013-10-24 DIAGNOSIS — I729 Aneurysm of unspecified site: Secondary | ICD-10-CM

## 2013-10-24 DIAGNOSIS — K810 Acute cholecystitis: Secondary | ICD-10-CM

## 2013-10-24 DIAGNOSIS — E785 Hyperlipidemia, unspecified: Secondary | ICD-10-CM

## 2013-10-24 DIAGNOSIS — Z01818 Encounter for other preprocedural examination: Secondary | ICD-10-CM

## 2013-10-24 DIAGNOSIS — R112 Nausea with vomiting, unspecified: Secondary | ICD-10-CM | POA: Insufficient documentation

## 2013-10-24 DIAGNOSIS — I25768 Atherosclerosis of bypass graft of coronary artery of transplanted heart with other forms of angina pectoris: Secondary | ICD-10-CM

## 2013-10-24 LAB — HEPATIC FUNCTION PANEL
ALT: 211 U/L — ABNORMAL HIGH (ref 0–35)
AST: 186 U/L — AB (ref 0–37)
Albumin: 3 g/dL — ABNORMAL LOW (ref 3.5–5.2)
Alkaline Phosphatase: 263 U/L — ABNORMAL HIGH (ref 39–117)
BILIRUBIN INDIRECT: 1.2 mg/dL — AB (ref 0.3–0.9)
BILIRUBIN TOTAL: 4.7 mg/dL — AB (ref 0.3–1.2)
Bilirubin, Direct: 3.5 mg/dL — ABNORMAL HIGH (ref 0.0–0.3)
Total Protein: 7.2 g/dL (ref 6.0–8.3)

## 2013-10-24 LAB — GLUCOSE, CAPILLARY
GLUCOSE-CAPILLARY: 276 mg/dL — AB (ref 70–99)
GLUCOSE-CAPILLARY: 244 mg/dL — AB (ref 70–99)
GLUCOSE-CAPILLARY: 241 mg/dL — AB (ref 70–99)
Glucose-Capillary: 405 mg/dL — ABNORMAL HIGH (ref 70–99)
Glucose-Capillary: 298 mg/dL — ABNORMAL HIGH (ref 70–99)

## 2013-10-24 LAB — MAGNESIUM: Magnesium: 2 mg/dL (ref 1.5–2.5)

## 2013-10-24 LAB — TROPONIN I
Troponin I: <0.3 ng/mL (ref <0.30)
Troponin I: 0.31 ng/mL (ref <0.30)
Troponin I: 0.31 ng/mL (ref <0.30)

## 2013-10-24 LAB — LIPID PANEL
CHOLESTEROL: 172 mg/dL (ref 0–200)
HDL: 10 mg/dL — ABNORMAL LOW (ref >39)
LDL Cholesterol: UNDETERMINED mg/dL (ref 0–99)
TRIGLYCERIDES: 470 mg/dL — AB (ref <150)
Total CHOL/HDL Ratio: 17.2 RATIO
VLDL: UNDETERMINED mg/dL (ref 0–40)

## 2013-10-24 LAB — CK TOTAL AND CKMB (NOT AT ARMC)
CK, MB: 3.4 ng/mL (ref 0.3–4.0)
Relative Index: 2.8 — ABNORMAL HIGH (ref 0.0–2.5)
Total CK: 121 U/L (ref 7–177)

## 2013-10-24 LAB — CBG MONITORING, ED: Glucose-Capillary: 340 mg/dL — ABNORMAL HIGH (ref 70–99)

## 2013-10-24 LAB — LIPASE, BLOOD: Lipase: 17 U/L (ref 11–59)

## 2013-10-24 LAB — TSH: TSH: 0.222 u[IU]/mL — ABNORMAL LOW (ref 0.350–4.500)

## 2013-10-24 LAB — HEMOGLOBIN A1C
Hgb A1c MFr Bld: 9 % — ABNORMAL HIGH (ref <5.7)
Mean Plasma Glucose: 212 mg/dL — ABNORMAL HIGH (ref <117)

## 2013-10-24 MED ORDER — HYDROCODONE-ACETAMINOPHEN 5-325 MG PO TABS
1.0000 | ORAL_TABLET | Freq: Four times a day (QID) | ORAL | Status: DC | PRN
  Administered 2013-10-24 – 2013-10-25 (×3): 1 via ORAL
  Filled 2013-10-24 (×3): qty 1

## 2013-10-24 MED ORDER — INSULIN ASPART 100 UNIT/ML ~~LOC~~ SOLN
8.0000 [IU] | Freq: Once | SUBCUTANEOUS | Status: AC
  Administered 2013-10-24: 8 [IU] via SUBCUTANEOUS

## 2013-10-24 MED ORDER — ENOXAPARIN SODIUM 100 MG/ML ~~LOC~~ SOLN
1.0000 mg/kg | Freq: Two times a day (BID) | SUBCUTANEOUS | Status: DC
  Administered 2013-10-25 – 2013-10-26 (×3): 90 mg via SUBCUTANEOUS
  Filled 2013-10-24 (×7): qty 1

## 2013-10-24 MED ORDER — SODIUM CHLORIDE 0.9 % IV SOLN
1.5000 g | Freq: Four times a day (QID) | INTRAVENOUS | Status: DC
  Administered 2013-10-24 – 2013-10-31 (×26): 1.5 g via INTRAVENOUS
  Filled 2013-10-24 (×32): qty 1.5

## 2013-10-24 MED ORDER — SODIUM CHLORIDE 0.9 % IJ SOLN: 3.0000 mL | INTRAMUSCULAR | Status: DC | PRN

## 2013-10-24 MED ORDER — ACETAMINOPHEN 325 MG PO TABS
650.0000 mg | ORAL_TABLET | Freq: Four times a day (QID) | ORAL | Status: DC | PRN
  Administered 2013-10-25 – 2013-10-26 (×4): 650 mg via ORAL
  Filled 2013-10-24 (×4): qty 2

## 2013-10-24 MED ORDER — ONDANSETRON HCL 4 MG/2ML IJ SOLN
4.0000 mg | Freq: Four times a day (QID) | INTRAMUSCULAR | Status: DC | PRN
  Administered 2013-10-24 – 2013-10-29 (×8): 4 mg via INTRAVENOUS
  Filled 2013-10-24 (×9): qty 2

## 2013-10-24 MED ORDER — ONDANSETRON 4 MG PO TBDP
4.0000 mg | ORAL_TABLET | Freq: Three times a day (TID) | ORAL | Status: DC | PRN
  Filled 2013-10-24 (×2): qty 1

## 2013-10-24 MED ORDER — ACETAMINOPHEN 500 MG PO TABS: 500.0000 mg | ORAL_TABLET | Freq: Four times a day (QID) | ORAL | Status: DC | PRN

## 2013-10-24 MED ORDER — FUROSEMIDE 40 MG PO TABS
40.0000 mg | ORAL_TABLET | Freq: Every day | ORAL | Status: DC
  Administered 2013-10-24 – 2013-10-26 (×3): 40 mg via ORAL
  Filled 2013-10-24 (×4): qty 1

## 2013-10-24 MED ORDER — HYDROXYZINE HCL 25 MG PO TABS: 25.0000 mg | ORAL_TABLET | Freq: Four times a day (QID) | ORAL | Status: DC | PRN

## 2013-10-24 MED ORDER — ENOXAPARIN SODIUM 100 MG/ML ~~LOC~~ SOLN
1.0000 mg/kg | Freq: Two times a day (BID) | SUBCUTANEOUS | Status: DC
  Administered 2013-10-24 (×2): 90 mg via SUBCUTANEOUS
  Filled 2013-10-24 (×2): qty 1

## 2013-10-24 MED ORDER — ASPIRIN EC 81 MG PO TBEC
81.0000 mg | DELAYED_RELEASE_TABLET | Freq: Every morning | ORAL | Status: DC
  Administered 2013-10-24 – 2013-10-26 (×3): 81 mg via ORAL
  Filled 2013-10-24 (×3): qty 1

## 2013-10-24 MED ORDER — FOLIC ACID 1 MG PO TABS
1.0000 mg | ORAL_TABLET | Freq: Every day | ORAL | Status: DC
  Administered 2013-10-24 – 2013-10-30 (×5): 1 mg via ORAL
  Filled 2013-10-24 (×8): qty 1

## 2013-10-24 MED ORDER — PANTOPRAZOLE SODIUM 40 MG PO TBEC
40.0000 mg | DELAYED_RELEASE_TABLET | Freq: Every day | ORAL | Status: DC
Start: 2013-10-24 — End: 2013-10-26
  Administered 2013-10-24 – 2013-10-26 (×3): 40 mg via ORAL
  Filled 2013-10-24 (×3): qty 1

## 2013-10-24 MED ORDER — ACETAMINOPHEN 650 MG RE SUPP
650.0000 mg | Freq: Four times a day (QID) | RECTAL | Status: DC | PRN
Start: 2013-10-24 — End: 2013-10-31

## 2013-10-24 MED ORDER — SODIUM CHLORIDE 0.9 % IV SOLN
INTRAVENOUS | Status: AC
Start: 2013-10-24 — End: 2013-10-25
  Administered 2013-10-24 (×2): via INTRAVENOUS

## 2013-10-24 MED ORDER — LORATADINE 10 MG PO TABS
10.0000 mg | ORAL_TABLET | Freq: Every day | ORAL | Status: DC
  Administered 2013-10-24 – 2013-10-26 (×3): 10 mg via ORAL
  Filled 2013-10-24 (×4): qty 1

## 2013-10-24 MED ORDER — SODIUM CHLORIDE 0.9 % IJ SOLN
3.0000 mL | Freq: Two times a day (BID) | INTRAMUSCULAR | Status: DC
  Administered 2013-10-24 – 2013-10-30 (×3): 3 mL via INTRAVENOUS

## 2013-10-24 MED ORDER — NITROGLYCERIN 0.4 MG SL SUBL
0.4000 mg | SUBLINGUAL_TABLET | SUBLINGUAL | Status: DC | PRN
  Administered 2013-10-24 (×2): 0.4 mg via SUBLINGUAL
  Filled 2013-10-24 (×2): qty 1

## 2013-10-24 MED ORDER — SODIUM CHLORIDE 0.9 % IJ SOLN
3.0000 mL | Freq: Two times a day (BID) | INTRAMUSCULAR | Status: DC
  Administered 2013-10-24 – 2013-10-30 (×6): 3 mL via INTRAVENOUS

## 2013-10-24 MED ORDER — INSULIN ASPART 100 UNIT/ML ~~LOC~~ SOLN
0.0000 [IU] | Freq: Every day | SUBCUTANEOUS | Status: DC
Start: 2013-10-24 — End: 2013-10-27
  Administered 2013-10-24: 2 [IU] via SUBCUTANEOUS
  Administered 2013-10-25: 3 [IU] via SUBCUTANEOUS

## 2013-10-24 MED ORDER — LABETALOL HCL 5 MG/ML IV SOLN
10.0000 mg | INTRAVENOUS | Status: DC | PRN
  Administered 2013-10-24 – 2013-10-27 (×4): 10 mg via INTRAVENOUS
  Filled 2013-10-24 (×5): qty 4

## 2013-10-24 MED ORDER — ONDANSETRON HCL 4 MG/2ML IJ SOLN
4.0000 mg | Freq: Three times a day (TID) | INTRAMUSCULAR | Status: DC
  Administered 2013-10-24 – 2013-10-27 (×9): 4 mg via INTRAVENOUS
  Filled 2013-10-24 (×8): qty 2

## 2013-10-24 MED ORDER — SODIUM CHLORIDE 0.9 % IV SOLN
250.0000 mL | INTRAVENOUS | Status: DC | PRN
  Administered 2013-10-28: 250 mL via INTRAVENOUS

## 2013-10-24 MED ORDER — MECLIZINE HCL 25 MG PO TABS
25.0000 mg | ORAL_TABLET | Freq: Two times a day (BID) | ORAL | Status: DC
  Administered 2013-10-24 – 2013-10-26 (×6): 25 mg via ORAL
  Filled 2013-10-24 (×2): qty 2
  Filled 2013-10-24 (×4): qty 1
  Filled 2013-10-24: qty 2
  Filled 2013-10-24: qty 1

## 2013-10-24 MED ORDER — LEVOTHYROXINE SODIUM 25 MCG PO TABS
25.0000 ug | ORAL_TABLET | Freq: Every day | ORAL | Status: DC
  Administered 2013-10-24 – 2013-10-30 (×6): 25 ug via ORAL
  Filled 2013-10-24 (×9): qty 1

## 2013-10-24 MED ORDER — CARVEDILOL 12.5 MG PO TABS
12.5000 mg | ORAL_TABLET | Freq: Two times a day (BID) | ORAL | Status: DC
  Administered 2013-10-24 – 2013-10-27 (×7): 12.5 mg via ORAL
  Filled 2013-10-24 (×9): qty 1

## 2013-10-24 MED ORDER — INSULIN GLARGINE 100 UNIT/ML ~~LOC~~ SOLN
70.0000 [IU] | Freq: Every day | SUBCUTANEOUS | Status: DC
  Administered 2013-10-24 – 2013-10-26 (×4): 70 [IU] via SUBCUTANEOUS
  Filled 2013-10-24 (×6): qty 0.7

## 2013-10-24 MED ORDER — INSULIN ASPART 100 UNIT/ML ~~LOC~~ SOLN
0.0000 [IU] | Freq: Three times a day (TID) | SUBCUTANEOUS | Status: DC
  Administered 2013-10-24 (×2): 5 [IU] via SUBCUTANEOUS
  Administered 2013-10-24 – 2013-10-25 (×2): 3 [IU] via SUBCUTANEOUS
  Administered 2013-10-25: 5 [IU] via SUBCUTANEOUS
  Administered 2013-10-25: 1 [IU] via SUBCUTANEOUS
  Administered 2013-10-26 (×2): 2 [IU] via SUBCUTANEOUS

## 2013-10-24 MED ORDER — CLINDAMYCIN HCL 150 MG PO CAPS: 300.0000 mg | ORAL_CAPSULE | Freq: Three times a day (TID) | ORAL | Status: DC

## 2013-10-24 MED ORDER — TAMSULOSIN HCL 0.4 MG PO CAPS
0.4000 mg | ORAL_CAPSULE | Freq: Every day | ORAL | Status: DC
  Administered 2013-10-24 – 2013-10-26 (×3): 0.4 mg via ORAL
  Filled 2013-10-24 (×3): qty 1

## 2013-10-24 NOTE — Consult Note (Signed)
Referring Provider: Dr. Rickey Barbara Primary Care Physician:  Milinda Antis, MD Primary Gastroenterologist:  Dr. Ewing Schlein 2014, Dr. Darrick Penna attending this admission  Date of Admission: 11/03/2013 Date of Consultation: 10/24/13  Reason for Consultation: elevated LFTs   HPI:  Cathy [name] is an 80 year old female who presented to the ED with nausea and vomiting, found to have blood sugar > 400. EKG noted ST depression with bump in troponin. Placed on protocol for NSTEMI. Cardiology to see patient later this afternoon. Gram negative bacteremia, started on Unasyn.  October 2014 underwent ERCP secondary to CBD stones by Dr. Ewing Schlein. Was seen again in July 2015 during admission at [facility] due to abdominal pain and nausea. Seen in consultation by Dr. Lovell Sheehan but patient desired surgical intervention in [place]. Patient tells me that she was told by Dr. Myrtis Ser that she was not appropriate for a laparoscopic cholecystectomy from a cardiac standpoint due to need for carotid endarterectomy due to severe disease.   [place] of abdomen this admission with cholelithiasis with gallbladder distension but equivocal finding for cholecystitis. Echogenic foci within dilated CBD concerning for choledocholithiasis, with CBD measuring 15.5 mm. States acute onset of nausea prior to admission, emesis X 1. Associated RUQ pain and chills. No fever.   Past Medical History  Diagnosis Date  . Hypertension     Unspecified  . Hyperlipidemia     Mixed, statin intolerance.  [name] CAD (coronary artery disease)     a. CAD s/p CABG 2003. b. Cath 2006: occluded OM vein graft. c. Anterior MI 04/15/12 s/p PTCA only to ramus (stenting unsuccessful at that time) d. NSTEMI 06/2012: failed med rx, s/p DES to ramus c/b vessel perforation tx with balloon/stenting; e. 02/1013 Cath: LM nl, LAD mod dzs mid, LIMA ok, LCX 119m, OM1 patent stent, VG->OM 100, RCA 100p, VG->PDA ok, EF 40-45%->Med Rx.  . S/P CABG (coronary artery bypass graft) 2003     2003   LIMA, LAD, SVG to the OM, SVG right coronary artery  . Thoracic ascending aortic aneurysm 2003    Ascending thoracic aneurysm repair with a graft at time of CABG  . Diabetes mellitus     Insulin dependent  . Gastroparesis   . Depression   . Allergy history, radiographic dye     Contrast dye allergy  . Previous back surgery   . Syncope 2008    Question?  2008  . Chronic systolic heart failure   . Ischemic cardiomyopathy 06/2011    a. Echo 04/19/12: EF 25-30%, mod LVH, diffuse HK, trivial AI  . Hypotension     October, 2013  . Hypothyroidism     Treated with low-dose Synthroid  . CKD (chronic kidney disease)   . Carotid artery disease     a. 80-99% prox RICA stenosis - with recent cardiac event 06/2012, favor waiting at least 1 month, optimally 3 months post PCI.  [name] Hypertriglyceridemia   . Statin intolerance   . Peripheral neuropathy   . PAF (paroxysmal atrial fibrillation)     a. Noted during 06/2012 hospitalization. Placed on amiodarone. Plan is for event monitor to assess for breakthrough, hold off anticoag unless additional AF seen.  [name] NSVT (nonsustained ventricular tachycardia)     a. Noted during 06/2012 hospitalization.  . Plavix Non-responder     a. Also allergic to brilinta.    Past Surgical History  Procedure Laterality Date  . Coronary artery bypass graft  05/05/2001     LIMA, LAD, SVG to  the OM, SVG right coronary artery  . Bladder tacking    . Salivary gland surgery    . Total abdominal hysterectomy    . Back surgery      X2   . Breast surgery    . External ear surgery      X2   . Cardiac catheterization  04/15/12, 07/05/12    60-70% proximal LAD, 95% mid AV groove circumflex s/p PTCA, proximal RCA occlusion, patent LIMA to LAD, patent SVG to distal RCA, chronically occluded SVG to circumflex; LVEF 25-30%  . Ercp N/A 11/04/2012    Dr. Ewing Schlein: bulbous ampulla, multiple CBD stones and debris status post removal/extraction, sphincterotomy  . Cataract extraction  w/phaco Right 05/31/2013    Procedure: CATARACT EXTRACTION PHACO AND INTRAOCULAR LENS PLACEMENT (IOC);  Surgeon: Loraine Leriche T. Nile Riggs, MD;  Location: [facility];  Service: Ophthalmology;  Laterality: Right;  CDE: 26.18   . Cataract extraction w/phaco Left 06/14/2013    Procedure: CATARACT EXTRACTION PHACO AND INTRAOCULAR LENS PLACEMENT (IOC);  Surgeon: Loraine Leriche T. Nile Riggs, MD;  Location: [facility];  Service: Ophthalmology;  Laterality: Left;  CDE:11.35  . Colon resection      remote past per patient    Prior to Admission medications   Medication Sig Start Date End Date Taking? Authorizing Provider  acetaminophen (TYLENOL) 500 MG tablet Take 1 tablet (500 mg total) by mouth every 6 (six) hours as needed for mild pain. 07/28/13  Yes Standley Brooking, MD  aspirin EC 81 MG tablet Take 81 mg by mouth every morning. 04/28/12  Yes Eugene C Serpe, PA-C  carvedilol (COREG) 12.5 MG tablet Take 1 tablet (12.5 mg total) by mouth 2 (two) times daily. 07/25/13  Yes Luis Abed, MD  cetirizine (ZYRTEC) 10 MG tablet Take 10 mg by mouth daily.   Yes Historical Provider, MD  clindamycin (CLEOCIN) 300 MG capsule Take 1 capsule (300 mg total) by mouth 3 (three) times daily. 10/19/13  Yes Salley Scarlet, MD  Cyanocobalamin (VITAMIN B 12 PO) Take 1 tablet by mouth daily.   Yes Historical Provider, MD  folic acid (FOLVITE) 400 MCG tablet Take 400 mcg by mouth daily.   Yes Historical Provider, MD  furosemide (LASIX) 40 MG tablet Take 40 mg by mouth daily.   Yes Historical Provider, MD  HYDROcodone-acetaminophen (NORCO) 5-325 MG per tablet Take 1 tablet by mouth every 6 (six) hours as needed for moderate pain. 10/19/13  Yes Salley Scarlet, MD  insulin glargine (LANTUS) 100 UNIT/ML injection Inject 70 Units into the skin at bedtime.  06/06/13  Yes Salley Scarlet, MD  insulin lispro (HUMALOG) 100 UNIT/ML injection Inject 16-20 Units into the skin 4 (four) times daily as needed for high blood sugar. Sliding scale as before admission to the  hospital. 06/08/13  Yes Salley Scarlet, MD  levothyroxine (SYNTHROID, LEVOTHROID) 25 MCG tablet Take 1 tablet (25 mcg total) by mouth daily before breakfast. 03/21/13  Yes Salley Scarlet, MD  meclizine (ANTIVERT) 25 MG tablet Take 25 mg by mouth 2 (two) times daily.   Yes Historical Provider, MD  nitroGLYCERIN (NITROSTAT) 0.4 MG SL tablet Place 1 tablet (0.4 mg total) under the tongue every 5 (five) minutes as needed for chest pain. May repeat times three. 03/21/13  Yes Salley Scarlet, MD  ondansetron (ZOFRAN-ODT) 4 MG disintegrating tablet Take 4 mg by mouth every 8 (eight) hours as needed for nausea or vomiting.   Yes Historical Provider, MD  tamsulosin (FLOMAX) 0.4  MG CAPS capsule Take 0.4 mg by mouth daily.   Yes Historical Provider, MD    Current Facility-Administered Medications  Medication Dose Route Frequency Provider Last Rate Last Dose  . 0.9 %  sodium chloride infusion   Intravenous Continuous Vanetta Mulders, MD 75 mL/hr at 11/06/2013 1830    . 0.9 %  sodium chloride infusion  250 mL Intravenous PRN Pearson Grippe, MD      . 0.9 %  sodium chloride infusion   Intravenous Continuous Pearson Grippe, MD 75 mL/hr at 10/24/13 0607    . acetaminophen (TYLENOL) tablet 650 mg  650 mg Oral Q6H PRN Pearson Grippe, MD       Or  . acetaminophen (TYLENOL) suppository 650 mg  650 mg Rectal Q6H PRN Pearson Grippe, MD      . ampicillin-sulbactam (UNASYN) 1.5 g in sodium chloride 0.9 % 50 mL IVPB  1.5 g Intravenous Q6H Jerald Kief, MD      . aspirin EC tablet 81 mg  81 mg Oral q morning - 10a Pearson Grippe, MD   81 mg at 10/24/13 1011  . carvedilol (COREG) tablet 12.5 mg  12.5 mg Oral BID WC Pearson Grippe, MD   12.5 mg at 10/24/13 0800  . enoxaparin (LOVENOX) injection 90 mg  1 mg/kg Subcutaneous Q12H Pearson Grippe, MD   90 mg at 10/24/13 0154  . folic acid (FOLVITE) tablet 1 mg  1 mg Oral Daily Pearson Grippe, MD   1 mg at 10/24/13 1030  . furosemide (LASIX) tablet 40 mg  40 mg Oral Daily Pearson Grippe, MD   40 mg at 10/24/13 1030  .  HYDROcodone-acetaminophen (NORCO/VICODIN) 5-325 MG per tablet 1 tablet  1 tablet Oral Q6H PRN Pearson Grippe, MD   1 tablet at 10/24/13 0154  . hydrOXYzine (ATARAX/VISTARIL) tablet 25 mg  25 mg Oral Q6H PRN Pearson Grippe, MD      . insulin aspart (novoLOG) injection 0-5 Units  0-5 Units Subcutaneous QHS Pearson Grippe, MD      . insulin aspart (novoLOG) injection 0-9 Units  0-9 Units Subcutaneous TID WC Pearson Grippe, MD   5 Units at 10/24/13 0800  . insulin glargine (LANTUS) injection 70 Units  70 Units Subcutaneous QHS Pearson Grippe, MD   70 Units at 10/24/13 0155  . levothyroxine (SYNTHROID, LEVOTHROID) tablet 25 mcg  25 mcg Oral QAC breakfast Pearson Grippe, MD   25 mcg at 10/24/13 0900  . loratadine (CLARITIN) tablet 10 mg  10 mg Oral Daily Pearson Grippe, MD   10 mg at 10/24/13 1030  . meclizine (ANTIVERT) tablet 25 mg  25 mg Oral BID Pearson Grippe, MD   25 mg at 10/24/13 1030  . nitroGLYCERIN (NITROSTAT) SL tablet 0.4 mg  0.4 mg Sublingual Q5 min PRN Pearson Grippe, MD      . ondansetron [facility]) injection 4 mg  4 mg Intravenous Q6H PRN Pearson Grippe, MD   4 mg at 10/24/13 1006  . ondansetron (ZOFRAN-ODT) disintegrating tablet 4 mg  4 mg Oral Q8H PRN Pearson Grippe, MD      . sodium chloride 0.9 % injection 3 mL  3 mL Intravenous Q12H Pearson Grippe, MD   3 mL at 10/24/13 1030  . sodium chloride 0.9 % injection 3 mL  3 mL Intravenous Q12H Pearson Grippe, MD   3 mL at 10/24/13 1130  . sodium chloride 0.9 % injection 3 mL  3 mL Intravenous PRN Pearson Grippe, MD      . tamsulosin (  FLOMAX) capsule 0.4 mg  0.4 mg Oral Daily Pearson Grippe, MD   0.4 mg at 10/24/13 1030    Allergies as of 10/29/2013 - Review Complete 11/04/2013  Allergen Reaction Noted  . Amiodarone Shortness Of Breath 05/04/2013  . Brilinta [ticagrelor] Shortness Of Breath and Nausea And Vomiting 03/12/2013  . Iohexol Anaphylaxis 04/26/2003  . Atorvastatin Other (See Comments)   . Citrus Dermatitis 07/19/2010  . Contrast media [iodinated diagnostic agents] Other (See Comments) 06/27/2012  .  Plavix [clopidogrel bisulfate]  07/09/2012  . Zolpidem tartrate Other (See Comments)   . Milk-related compounds Rash 07/09/2011    Family History  Problem Relation Age of Onset  . Heart disease Other   . Heart disease Mother   . Hypertension Mother   . Heart disease Father   . Hypertension Father     History   Social History  . Marital Status: Divorced    Spouse Name: N/A    Number of Children: N/A  . Years of Education: N/A   Occupational History  . Retired    Social History Main Topics  . Smoking status: Never Smoker   . Smokeless tobacco: Never Used  . Alcohol Use: No  . Drug Use: No  . Sexual Activity: Not Currently   Other Topics Concern  . Not on file   Social History Narrative  . No narrative on file    Review of Systems: Gen: see HPI CV: +chest discomfort occasionally Resp: +SOB, +DOE GI: see HPI GU : chronic UTIs MS: +arthritis Derm: Denies rash, itching, dry skin Psych: Denies depression, anxiety,confusion, or memory loss Heme: Denies bruising, bleeding, and enlarged lymph nodes.  Physical Exam: Vital signs in last 24 hours: Temp:  [97.5 F (36.4 C)-99.4 F (37.4 C)] 99.4 F (37.4 C) (10/05 0555) Pulse Rate:  [82-92] 92 (10/05 0555) Resp:  [17-22] 17 (10/05 0555) BP: (120-149)/(52-71) 149/60 mmHg (10/05 0555) SpO2:  [94 %-100 %] 100 % (10/05 0555) Weight:  [197 lb 15 oz (89.785 kg)-198 lb (89.812 kg)] 197 lb 15 oz (89.785 kg) (10/05 1610) Last BM Date: 10/24/13 General:   Alert,  Well-developed, well-nourished, pleasant and cooperative in NAD Head:  Normocephalic and atraumatic. Eyes:  Mild scleral icterus Ears:  Hard of hearing Nose:  No deformity, discharge,  or lesions. Mouth:  No deformity or lesions, poor dentition Lungs:  Clear throughout to auscultation.   No wheezes, crackles, or rhonchi. No acute distress. Heart:  S1 S2 present Abdomen:  Soft, mild TTP RUQ  and nondistended. No masses, hepatosplenomegaly or hernias noted. Normal  bowel sounds, without guarding, and without rebound.   Rectal:  Deferred  Msk:  Symmetrical without gross deformities. Normal posture. Extremities:  Without  edema. Neurologic:  Alert and  oriented x4;  grossly normal neurologically. Skin:  Intact without significant lesions or rashes. Psych:  Alert and cooperative. Normal mood and affect.  Intake/Output from previous day: 10/04 0701 - 10/05 0700 In: 375 [I.V.:375] Out: 250 [Urine:250] Intake/Output this shift:    Lab Results:  Recent Labs  10/29/2013 1733  WBC 12.6*  HGB 13.7  HCT 40.9  PLT 213   BMET  Recent Labs  10/21/2013 1733  NA 132*  K 4.5  CL 91*  CO2 27  GLUCOSE 462*  BUN 20  CREATININE 1.16*  CALCIUM 9.4   LFT  Recent Labs  10/27/2013 1733  PROT 7.4  ALBUMIN 3.0*  AST 301*  ALT 264*  ALKPHOS 265*  BILITOT 6.1*    Studies/Results:  Dg Chest 2 View  10/30/2013   CLINICAL DATA:  Pt c/o sob, cough today, states that her blood sugar was nearly 500 when EMS arrived at her home. C/o weakness, lethargy, unable to stand.  EXAM: CHEST  2 VIEW  COMPARISON:  08/11/2013  FINDINGS: Prior median sternotomy. Numerous leads and wires project over the chest. Bilateral glenohumeral joint osteoarthritis. Midline trachea. Moderate cardiomegaly. Mild right hemidiaphragm elevation. No pleural effusion or pneumothorax. No congestive failure. Mildly low lung volumes. Clear lungs.  IMPRESSION: Cardiomegaly and mildly low lung volumes, without acute disease.   Electronically Signed   By: Jeronimo GreavesKyle  Talbot M.D.   On: 10/28/2013 18:20   [place] Abdomen Limited Ruq  10/24/2013   CLINICAL DATA:  Nausea, vomiting since Friday, CHF, diabetes, high cholesterol  EXAM: US ABDOMEN LIMITED - RIGHT UPPER QUADRANT  COMPARISON:  None.  FINDINGS: Gallbladder:  Cholelithiasis without gallbladder wall thickening or pericholecystic fluid. The gallbladder is distended. No sonographic Murphy sign noted.  Common bile duct:  Diameter: 15.5 mm. Echogenic foci  within the common bile duct concerning for choledocholithiasis.  Liver:  No focal lesion identified. Mild increased liver size measuring 18.9 cm in length. Increased hepatic parenchymal echogenicity.  IMPRESSION: 1. Cholelithiasis with gallbladder distention, but without pericholecystic fluid or gallbladder wall thickening. The findings are equivocal for cholecystitis. 2. Echogenic foci within a dilated common bile duct most concerning for choledocholithiasis. The common bile duct measures 15.5 mm in diameter. 3. Hepatic steatosis. 4. Mild hepatomegaly.   Electronically Signed   By: Elige KoHetal  Patel   On: 10/24/2013 11:45    Impression: 80 year old female admitted with nausea, vomiting, RUQ pain, with multiple medical issues to include ?NSTEMI, hyperglycemia, gram negative bacteremia, and evidence of bump in LFTs with bilirubin in the 6 range,  likely choledocholithiasis on US abdomen. She has undergone an ERCP in Oct 2014 due to CBD stones and recommended elective cholecystectomy; it appears this has been put on hold due to cardiac issues per patient. Concern for cholangitis; patient is on Unasyn. Course is complicated by cardiac issues, therapeutic Lovenox dosing, and allergy to IV dye. ERCP ultimately needed with sphincterotomy and stone extraction, but patient will need to be cleared by cardiology prior. She will require holding of Lovenox and administration of Prednisone and Benadryl on a timed regimen for premedication prior to ERCP due to dye allergy. (Prednisone 50 mg at 13 hours prior to procedure, 7 hours prior to procedure, and 1 hour prior to procedure. Benadryl 50 mg 1 hour prior to procedure).   Plan: Add PPI for GI prophylaxis Check lipase Recheck HFP today May have clear liquids Await cardiology input ERCP timing to be determined after cardiology input Will need premedication with Prednisone and Benadryl    Nira RetortAnna W. Sams, ANP-BC [facility]     LOS: 1 day     10/24/2013, 1:24 PM

## 2013-10-24 NOTE — Care Management Utilization Note (Signed)
UR completed 

## 2013-10-24 NOTE — Care Management Note (Signed)
    Page 1 of 1   10/25/2013     2:31:01 PM CARE MANAGEMENT NOTE 10/25/2013  Patient:  Cathy [name],Cathy [name]   Account Number:  [id number]  Date Initiated:  10/24/2013  Documentation initiated by:  Anibal HendersonBOLDEN,Kabria Hetzer  Subjective/Objective Assessment:   Admitted with N&V NSTEMI, ARF,  + blood cultures, + cholecystitis, hypoglycemia. Pt is from home, lives alone with assistance of friends and has an aide     Action/Plan:   May need surgery-- Will  need HH vs SNF- will follow   Anticipated DC Date:  10/25/2013   Anticipated DC Plan:  ACUTE TO ACUTE TRANS      DC Planning Services  CM consult      Choice offered to / List presented to:             Status of service:  Completed, signed off Medicare Important Message given?   (If response is "NO", the following Medicare IM given date fields will be blank) Date Medicare IM given:   Medicare IM given by:   Date Additional Medicare IM given:   Additional Medicare IM given by:    Discharge Disposition:  ACUTE TO ACUTE TRANS  Per UR Regulation:  Reviewed for med. necessity/level of care/duration of stay  If discussed at Long Length of Stay Meetings, dates discussed:    Comments:  10/25/13 1200 Anibal HendersonGeneva Hargun Spurling RN/RN [facility] not have an aide, [facility]   [facility] 10/24/13 1630 Anibal HendersonGeneva Norvella Loscalzo RN/CM

## 2013-10-24 NOTE — Progress Notes (Signed)
ANTIBIOTIC CONSULT NOTE - INITIAL  Pharmacy Consult for Unasyn Indication: bacteremia  Allergies  Allergen Reactions  . Amiodarone Shortness Of Breath  . Brilinta [Ticagrelor] Shortness Of Breath and Nausea And Vomiting  . Iohexol Anaphylaxis       . Atorvastatin Other (See Comments)    Unknown-patient admitted to hospital after taking  . Citrus Dermatitis    BREAK OUT ON BODY  . Contrast Media [Iodinated Diagnostic Agents] Other (See Comments)    Passed out  . Plavix [Clopidogrel Bisulfate]     Not an allergy - but PRU was drawn 06/2012 indicating Plavix hyporesponsiveness, thus she was changed to Brilinta.  . Zolpidem Tartrate Other (See Comments)    Hallucinations  . Milk-Related Compounds Rash   Patient Measurements: Height: 5\' 5"  (165.1 cm) Weight: 197 lb 15 oz (89.785 kg) IBW/kg (Calculated) : 57  Vital Signs: Temp: 99.4 F (37.4 C) (10/05 0555) Temp Source: Oral (10/05 0555) BP: 149/60 mmHg (10/05 0555) Pulse Rate: 92 (10/05 0555) Intake/Output from previous day: 10/04 0701 - 10/05 0700 In: 375 [I.V.:375] Out: 250 [Urine:250] Intake/Output from this shift:    Labs:  Recent Labs  11/08/2013 1733  WBC 12.6*  HGB 13.7  PLT 213  CREATININE 1.16*   Estimated Creatinine Clearance: 42.1 ml/min (by C-G formula based on Cr of 1.16). No results found for this basename: VANCOTROUGH, VANCOPEAK, VANCORANDOM, GENTTROUGH, GENTPEAK, GENTRANDOM, TOBRATROUGH, TOBRAPEAK, TOBRARND, AMIKACINPEAK, AMIKACINTROU, AMIKACIN,  in the last 72 hours   Microbiology: Recent Results (from the past 720 hour(s))  CULTURE, BLOOD (ROUTINE X 2)     Status: None   Collection Time    11/08/2013  9:12 PM      Result Value Ref Range Status   Specimen Description BLOOD LEFT ANTECUBITAL   Final   Special Requests BOTTLES DRAWN AEROBIC ONLY 6CC   Final   Culture     Final   Value: GRAM NEGATIVE RODS     Gram Stain Report Called to,Read Back By and Verified With: TANDON R. AT 1034A ON [id number]  THOMPSON S.     Performed at [facility]   Report Status PENDING   Incomplete  CULTURE, BLOOD (ROUTINE X 2)     Status: None   Collection Time    11/11/2013  9:18 PM      Result Value Ref Range Status   Specimen Description BLOOD LEFT HAND   Final   Special Requests BOTTLES DRAWN AEROBIC AND ANAEROBIC 6CC   Final   Culture  Setup Time PENDING   Incomplete   Culture     Final   Value: GRAM NEGATIVE RODS     Gram Stain Report Called to,Read Back By and Verified With: TANDON,R. AT 0981 ON 10/24/2013 BY HUFFINES,S.      Performed at [facility]   Report Status PENDING   Incomplete   Medical History: Past Medical History  Diagnosis Date  . Hypertension     Unspecified  . Hyperlipidemia     Mixed, statin intolerance.  [name] CAD (coronary artery disease)     a. CAD s/p CABG 2003. b. Cath 2006: occluded OM vein graft. c. Anterior MI 04/15/12 s/p PTCA only to ramus (stenting unsuccessful at that time) d. NSTEMI 06/2012: failed med rx, s/p DES to ramus c/b vessel perforation tx with balloon/stenting; e. 02/1013 Cath: LM nl, LAD mod dzs mid, LIMA ok, LCX 123m, OM1 patent stent, VG->OM 100, RCA 100p, VG->PDA ok, EF 40-45%->Med Rx.  . S/P CABG (coronary  artery bypass graft) 2003     2003  LIMA, LAD, SVG to the OM, SVG right coronary artery  . Thoracic ascending aortic aneurysm 2003    Ascending thoracic aneurysm repair with a graft at time of CABG  . Diabetes mellitus     Insulin dependent  . Gastroparesis   . Depression   . Allergy history, radiographic dye     Contrast dye allergy  . Previous back surgery   . Syncope 2008    Question?  2008  . Chronic systolic heart failure   . Ischemic cardiomyopathy 06/2011    a. Echo 04/19/12: EF 25-30%, mod LVH, diffuse HK, trivial AI  . Hypotension     October, 2013  . Hypothyroidism     Treated with low-dose Synthroid  . CKD (chronic kidney disease)   . Carotid artery disease     a. 80-99% prox RICA stenosis - with recent cardiac event  06/2012, favor waiting at least 1 month, optimally 3 months post PCI.  [name]. Hypertriglyceridemia   . Statin intolerance   . Peripheral neuropathy   . PAF (paroxysmal atrial fibrillation)     a. Noted during 06/2012 hospitalization. Placed on amiodarone. Plan is for event monitor to assess for breakthrough, hold off anticoag unless additional AF seen.  [name]. NSVT (nonsustained ventricular tachycardia)     a. Noted during 06/2012 hospitalization.  . Plavix Non-responder     a. Also allergic to brilinta.   Anti-infectives   Start     Dose/Rate Route Frequency Ordered Stop   10/24/13 1200  ampicillin-sulbactam (UNASYN) 1.5 g in sodium chloride 0.9 % 50 mL IVPB     1.5 g 100 mL/hr over 30 Minutes Intravenous Every 6 hours 10/24/13 1107     10/24/13 1000  clindamycin (CLEOCIN) capsule 300 mg  Status:  Discontinued     300 mg Oral 3 times daily 10/24/13 0100 10/24/13 0928   03-20-2013 2100  cefTRIAXone (ROCEPHIN) 1 g in dextrose 5 % 50 mL IVPB     1 g 100 mL/hr over 30 Minutes Intravenous  Once 03-20-2013 2100 03-20-2013 2140     Assessment: 80yo female with positive blood cultures.  GNR in 2/2 cx's. Estimated Creatinine Clearance: 42.1 ml/min (by C-G formula based on Cr of 1.16).  Goal of Therapy:  Eradicate infection.  Plan:  Unasyn 1.5gm IV q6hrs Monitor labs, renal fxn and cultures  Margo AyeHall, Xee Hollman A 10/24/2013,11:08 AM

## 2013-10-24 NOTE — Consult Note (Signed)
The patient was seen and examined, and I agree with the assessment and plan as documented above, with modifications as noted below. Pt with complex cardiovascular history as noted above presenting with gram negative bacteremia and cholangitis, in need of ERCP. Coronary angiography earlier this year demonstrated stable plaque disease, patent stent, and no suitable targets for revascularization (patent LIMA to LAD, patent SVG to PDA, patent stent in OM1, occluded SVG to OM). Echocardiogram no 07/05/12 demonstrated EF possibly in 40% range. ECG demonstrated sinus rhythm with LBBB, seen on prior ECG. Today EF 30-35% with grade 2 diastolic dysfunction and moderate mitral regurgitation. Had mild troponin elevation in context of gra-negative bacteremia, consistent with demand ischemia (not NSTEMI). Has severe RICA stenosis as documented by Do[facility] in 04/2012. At present, there is no evidence of CHF. Likely has elevated BNP from elevated filling pressures from moderately depressed EF and tachycardia in setting of bacteremia/cholangitis. Primary complaint is left-sided jaw pain after eating cracker. Seldom has chest pain, primarily abdominal pain. Chronic dyspnea which may be slightly worse in past several months.  RECS: Pt is at moderate risk for a major adverse perioperative cardiac event. Would recommend continuing ASA and Coreg if wishing to proceed with ERCP, and if pt is willing. Given decline in EF, could consider outpatient stress testing. Pt should f/u with Dr. Myrtis SerKatz regarding this.

## 2013-10-24 NOTE — Progress Notes (Signed)
[facility] PROGRESS NOTE  Cathy [name] HEN:[id number] DOB: 1930-10-24 DOA: 10/25/2013 PCP: Milinda Antis, MD  Assessment/Plan: 1. Acute cholecystits 1. Multiple prior admissions for similar events, most recent in 7/15 where pt's LFT's trended back to normal and pt was recommended to follow up with General Surgery as an outpatient. Unclear if patient went to that follow up visit. 2. Markedly elevated alk phos, ast/alt on admit with total bili of 6 3. NPO currently 4. RUQ [place] ordered 5. Will consult GI, may need surgical consult ultimately 6. Cont on Unasyn per below 7. Cont with antiemetics as tolerated 8. Have asked Cardiology to comment on cardiac clearance should pt be recommended to have cholecystectomy - see below 2. ?NSTEMI 1. Peak trop of 0.41 on admit, since trended down to normal 2. Depressions noted on EKG 3. Presently asymptomatic 4. Have discussed with Cardiology, who will assess pt 3. ARF 1. Will cont to monitor renal fx 4. DM2 1. On 70 units lantus with SSI coverage 2. BS still elevated, but improving 5. Hyponatremia 1. Likely secondary to markedly elevated glucose on admission 2. Monitor closely 3. Correct glucose 6. Gm neg bacteremia 1. Cont on Unasyn 2. Likely secondary to acute cholecystitis 7. DVT prophylaxis 1. Lovenox suQ  Code Status: Full Family Communication: Pt in room Disposition Plan: Pending   Consultants:  Cardiology  Procedures:    Antibiotics:  Clindamycin (home regimen) 10/4>>>10/5  Unasyn 10/5>>>  HPI/Subjective: Complains of abd pain with nausea.  Objective: Filed Vitals:   11/04/2013 2300 10/24/13 0103 10/24/13 0555 10/24/13 0614  BP: 125/59 142/61 149/60   Pulse: 89 90 92   Temp:  97.5 F (36.4 C) 99.4 F (37.4 C)   TempSrc:  Oral Oral   Resp:  20 17   Height:      Weight:    89.785 kg (197 lb 15 oz)  SpO2: 97% 100% 100%     Intake/Output Summary (Last 24 hours) at 10/24/13 0913 Last data filed at  10/24/13 0700  Gross per 24 hour  Intake    375 ml  Output    250 ml  Net    125 ml   Filed Weights   10/21/2013 1732 10/24/13 0614  Weight: 89.812 kg (198 lb) 89.785 kg (197 lb 15 oz)   Exam:  General:  Awake, appears to be in pain  Cardiovascular: regular, s1, s2  Respiratory: normal resp effort, no wheezing  Abdomen: soft, decreased BS, generally tender throughout  Musculoskeletal: perfused, no clubbing   Data Reviewed: Basic Metabolic Panel:  Recent Labs Lab 11/14/2013 1733  NA 132*  K 4.5  CL 91*  CO2 27  GLUCOSE 462*  BUN 20  CREATININE 1.16*  CALCIUM 9.4   Liver Function Tests:  Recent Labs Lab 10/21/2013 1733  AST 301*  ALT 264*  ALKPHOS 265*  BILITOT 6.1*  PROT 7.4  ALBUMIN 3.0*   No results found for this basename: LIPASE, AMYLASE,  in the last 168 hours No results found for this basename: AMMONIA,  in the last 168 hours CBC:  Recent Labs Lab 11/09/2013 1733  WBC 12.6*  NEUTROABS 10.1*  HGB 13.7  HCT 40.9  MCV 91.7  PLT 213   Cardiac Enzymes:  Recent Labs Lab 10/27/2013 1757 11/06/2013 2118 10/24/13 0230 10/24/13 0719  TROPONINI 0.41* 0.36* <0.30 0.31*   BNP (last 3 results)  Recent Labs  03/12/13 1830 11/04/2013 1757  PROBNP 702.3* 2918.0*   CBG:  Recent Labs Lab 11/01/2013 1947 11/15/2013  2300 10/24/13 0012 10/24/13 0122 10/24/13 0834  GLUCAP 396* 395* 340* 405* 298*    Recent Results (from the past 240 hour(s))  CULTURE, BLOOD (ROUTINE X 2)     Status: None   Collection Time    11/08/2013  9:12 PM      Result Value Ref Range Status   Specimen Description Blood LEFT ANTECUBITAL   Final   Special Requests BOTTLES DRAWN AEROBIC ONLY 6CC   Final   Culture PENDING   Incomplete   Report Status PENDING   Incomplete  CULTURE, BLOOD (ROUTINE X 2)     Status: None   Collection Time    11/15/2013  9:18 PM      Result Value Ref Range Status   Specimen Description Blood BLOOD LEFT HAND   Final   Special Requests BOTTLES DRAWN  AEROBIC AND ANAEROBIC 6CC   Final   Culture  Setup Time     Final   Value: GRAM NEGATIVE RODS     Gram Stain Report Called to,Read Back By and Verified With: RANDON,R AT 0853 BY HUFFINES,S ON 10/24/13   Culture PENDING   Incomplete   Report Status PENDING   Incomplete     Studies: Dg Chest 2 View  10/21/2013   CLINICAL DATA:  Pt c/o sob, cough today, states that her blood sugar was nearly 500 when EMS arrived at her home. C/o weakness, lethargy, unable to stand.  EXAM: CHEST  2 VIEW  COMPARISON:  08/11/2013  FINDINGS: Prior median sternotomy. Numerous leads and wires project over the chest. Bilateral glenohumeral joint osteoarthritis. Midline trachea. Moderate cardiomegaly. Mild right hemidiaphragm elevation. No pleural effusion or pneumothorax. No congestive failure. Mildly low lung volumes. Clear lungs.  IMPRESSION: Cardiomegaly and mildly low lung volumes, without acute disease.   Electronically Signed   By: Abigail Miyamoto M.D.   On: 11/07/2013 18:20    Scheduled Meds: . sodium chloride   Intravenous STAT  . aspirin EC  81 mg Oral q morning - 10a  . carvedilol  12.5 mg Oral BID WC  . clindamycin  300 mg Oral TID  . enoxaparin (LOVENOX) injection  1 mg/kg Subcutaneous Q12H  . folic acid  1 mg Oral Daily  . furosemide  40 mg Oral Daily  . insulin aspart  0-5 Units Subcutaneous QHS  . insulin aspart  0-9 Units Subcutaneous TID WC  . insulin glargine  70 Units Subcutaneous QHS  . levothyroxine  25 mcg Oral QAC breakfast  . loratadine  10 mg Oral Daily  . meclizine  25 mg Oral BID  . sodium chloride  3 mL Intravenous Q12H  . sodium chloride  3 mL Intravenous Q12H  . tamsulosin  0.4 mg Oral Daily   Continuous Infusions: . sodium chloride 75 mL/hr at 11/04/2013 1830  . sodium chloride 75 mL/hr at 10/24/13 0607    Active Problems:   Hyperglycemia   Nausea with vomiting   NSTEMI (non-ST elevated myocardial infarction)  Time spent: 17min  [facility], [facility] Pager  [phone]. If 7PM-7AM, please contact night-coverage at [email or web address], password [facility] 10/24/2013, 9:13 AM  LOS: 1 day

## 2013-10-24 NOTE — Consult Note (Signed)
REVIEWED. PT PRESENTS WITH GNR BACTEREMIA MOST LIKELY DUE TO CHOLANGITIS. NEEDS ERCP/STONE EXTRACTION WHEN CLEARED BY CARDIOLOGY. CONTINUE UNASYN. AWAIT CARDIOLOGY RECS. PT WILL NEED CONTRAST PRE-,ED PRIOR TO ERCP.

## 2013-10-24 NOTE — Consult Note (Signed)
CARDIOLOGY CONSULT NOTE   Patient ID: Cathy [name] MRN: [id number] DOB/AGE: April 05, 1930 80 y.o.  Admit Date: 11/14/2013 Referring Physician: PTH Primary Physician: Cathy Antis, MD Consulting Cardiologist: Cathy Docker MD Primary Cardiologist: Cathy [name] [facility]) Reason for Consultation: Pre-Op evaluation for Cholectectomy/Postive Troponin   Clinical Summary Cathy [name] is a 80 y.o.female with known history of CAD, DES to OM, s/p cath in 2015, CHF, ICM, CABG, atrial fibrillation, carotid artery disease, with multiple other medical problems, admitted with NV, hyperglycemia, and cholecystitis, dilated common bile duct concerning for choledocholithiasis. We are asked to evaluate patient for surgical risk and comment on troponin leak on admission.   She states her troubles started when she had a tooth abscess on Friday and was placed on antibiotics. She began taking them, but felt as if they stuck in her throat. She began to have severe nausea. Vomited once and then had abdominal pain. She became weaker and presented to the ER. She has had transient chest pain but most of her pain is in her abdomen.   Labs demonstrated minimal elevation in troponin with values 0.41;0.36, 0.30;0.31;0.31 respectively since admission. EKG demonstrated LBBB with minimal ST-T wave depression in the lateral leads rate of 85 bpm. Other labs reviewed. Elevated BG at 462, elevated LFT's with mild hyponatremia. Hypertriglyceridemia was noted.   Allergies  Allergen Reactions  . Amiodarone Shortness Of Breath  . Brilinta [Ticagrelor] Shortness Of Breath and Nausea And Vomiting  . Iohexol Anaphylaxis       . Atorvastatin Other (See Comments)    Unknown-patient admitted to hospital after taking  . Citrus Dermatitis    BREAK OUT ON BODY  . Contrast Media [Iodinated Diagnostic Agents] Other (See Comments)    Passed out  . Plavix [Clopidogrel Bisulfate]     Not an allergy - but PRU was drawn 06/2012  indicating Plavix hyporesponsiveness, thus she was changed to Brilinta.  . Zolpidem Tartrate Other (See Comments)    Hallucinations  . Milk-Related Compounds Rash    Medications Scheduled Medications: . ampicillin-sulbactam (UNASYN) IV  1.5 g Intravenous Q6H  . aspirin EC  81 mg Oral q morning - 10a  . carvedilol  12.5 mg Oral BID WC  . enoxaparin (LOVENOX) injection  1 mg/kg Subcutaneous Q12H  . folic acid  1 mg Oral Daily  . furosemide  40 mg Oral Daily  . insulin aspart  0-5 Units Subcutaneous QHS  . insulin aspart  0-9 Units Subcutaneous TID WC  . insulin glargine  70 Units Subcutaneous QHS  . levothyroxine  25 mcg Oral QAC breakfast  . loratadine  10 mg Oral Daily  . meclizine  25 mg Oral BID  . pantoprazole  40 mg Oral Daily  . sodium chloride  3 mL Intravenous Q12H  . sodium chloride  3 mL Intravenous Q12H  . tamsulosin  0.4 mg Oral Daily    Infusions: . sodium chloride 75 mL/hr at 10/26/2013 1830  . sodium chloride 75 mL/hr at 10/24/13 0607    PRN Medications: sodium chloride, acetaminophen, acetaminophen, HYDROcodone-acetaminophen, hydrOXYzine, nitroGLYCERIN, ondansetron (ZOFRAN) IV, ondansetron, sodium chloride   Past Medical History  Diagnosis Date  . Hypertension     Unspecified  . Hyperlipidemia     Mixed, statin intolerance.  [name] CAD (coronary artery disease)     a. CAD s/p CABG 2003. b. Cath 2006: occluded OM vein graft. c. Anterior MI 04/15/12 s/p PTCA only to ramus (stenting unsuccessful at that time) d. NSTEMI 06/2012: failed med  rx, s/p DES to ramus c/b vessel perforation tx with balloon/stenting; e. 02/1013 Cath: LM nl, LAD mod dzs mid, LIMA ok, LCX 164m, OM1 patent stent, VG->OM 100, RCA 100p, VG->PDA ok, EF 40-45%->Med Rx.  . S/P CABG (coronary artery bypass graft) 2003     2003  LIMA, LAD, SVG to the OM, SVG right coronary artery  . Thoracic ascending aortic aneurysm 2003    Ascending thoracic aneurysm repair with a graft at time of CABG  . Diabetes  mellitus     Insulin dependent  . Gastroparesis   . Depression   . Allergy history, radiographic dye     Contrast dye allergy  . Previous back surgery   . Syncope 2008    Question?  2008  . Chronic systolic heart failure   . Ischemic cardiomyopathy 06/2011    a. Echo 04/19/12: EF 25-30%, mod LVH, diffuse HK, trivial AI  . Hypotension     October, 2013  . Hypothyroidism     Treated with low-dose Synthroid  . CKD (chronic kidney disease)   . Carotid artery disease     a. 80-99% prox RICA stenosis - with recent cardiac event 06/2012, favor waiting at least 1 month, optimally 3 months post PCI.  [name] Hypertriglyceridemia   . Statin intolerance   . Peripheral neuropathy   . PAF (paroxysmal atrial fibrillation)     a. Noted during 06/2012 hospitalization. Placed on amiodarone. Plan is for event monitor to assess for breakthrough, hold off anticoag unless additional AF seen.  [name] NSVT (nonsustained ventricular tachycardia)     a. Noted during 06/2012 hospitalization.  . Plavix Non-responder     a. Also allergic to brilinta.    Past Surgical History  Procedure Laterality Date  . Coronary artery bypass graft  05/05/2001     LIMA, LAD, SVG to the OM, SVG right coronary artery  . Bladder tacking    . Salivary gland surgery    . Total abdominal hysterectomy    . Back surgery      X2   . Breast surgery    . External ear surgery      X2   . Cardiac catheterization  04/15/12, 07/05/12    60-70% proximal LAD, 95% mid AV groove circumflex s/p PTCA, proximal RCA occlusion, patent LIMA to LAD, patent SVG to distal RCA, chronically occluded SVG to circumflex; LVEF 25-30%  . Ercp N/A 11/04/2012    Dr. Ewing Schlein: bulbous ampulla, multiple CBD stones and debris status post removal/extraction, sphincterotomy  . Cataract extraction w/phaco Right 05/31/2013    Procedure: CATARACT EXTRACTION PHACO AND INTRAOCULAR LENS PLACEMENT (IOC);  Surgeon: Loraine Leriche T. Nile Riggs, MD;  Location: [facility];  Service: Ophthalmology;   Laterality: Right;  CDE: 26.18   . Cataract extraction w/phaco Left 06/14/2013    Procedure: CATARACT EXTRACTION PHACO AND INTRAOCULAR LENS PLACEMENT (IOC);  Surgeon: Loraine Leriche T. Nile Riggs, MD;  Location: [facility];  Service: Ophthalmology;  Laterality: Left;  CDE:11.35  . Colon resection      remote past per patient    Family History  Problem Relation Age of Onset  . Heart disease Other   . Heart disease Mother   . Hypertension Mother   . Heart disease Father   . Hypertension Father     Social History Ms. Auker reports that she has never smoked. She has never used smokeless tobacco. Ms. Bellantoni reports that she does not drink alcohol.  Review of Systems Otherwise reviewed and negative except as outlined.  Physical Examination Blood pressure 149/60, pulse 92, temperature 99.4 F (37.4 C), temperature source Oral, resp. rate 17, height 5\' 5"  (1.651 m), weight 197 lb 15 oz (89.785 kg), SpO2 100.00%.  Intake/Output Summary (Last 24 hours) at 10/24/13 1530 Last data filed at 10/24/13 0700  Gross per 24 hour  Intake    375 ml  Output    250 ml  Net    125 ml    Telemetry:  GEN: Resting with complaints of abdominal pain and low grade nausea.  HEENT: Conjunctiva and lids normal, oropharynx clear with moist mucosa. Neck: Supple, no elevated JVP or carotid bruits, no thyromegaly. Lungs: Clear to auscultation, nonlabored breathing at rest. Cardiac: Regular rate and rhythm, 1/6 systolic murmur, no S3 or significant systolic murmur, no pericardial rub. Abdomen: Soft, tenderness is noted with hyperactive BS, no hepatomegaly, bowel sounds present, no guarding or rebound. Extremities: No pitting edema, distal pulses 2+. Skin: Warm and dry. Musculoskeletal: No kyphosis. Neuropsychiatric: Alert and oriented x3, affect grossly appropriate.  Prior Cardiac Testing/Procedures 03/14/2013 IMPRESSIONS:  1. Patent left main coronary artery. 2. Moderately diseased mid left anterior descending artery.  Patent first diagonal. Patent LIMA to LAD. Diffuse disease in the distal/apical native LAD, beyond the insertion of the LIMA. 3. Widely patent stent in the large first obtuse marginal. Occluded mid left circumflex artery with both left to left and right-to-left collaterals filling the distal circumflex system. 4. Occluded proximal right coronary artery. Patent SVG to PDA  5. Mildly decreased left ventricular systolic function. LVEDP 24 mmHg. Ejection fraction 40-45%. 6. No renal artery stenosis. Mild aortic atherosclerosis. Mild dilatation of the infrarenal aorta without severe focal aneurysm.  NM Stress Test 11/01/2013 IMPRESSION:  1. Findings worrisome for prior infarctions involving the inferior  wall, apical aspect of the septum and basilar aspect of the lateral  wall. Given background of suspected prior infarctions, there is no  definitive scintigraphic evidence of pharmacologically induced  ischemia.  2. Mildly dilated left ventricle with geographic areas of  hypokinesia/akinesia involving the suspected areas of prior  infarction as detailed above. Ejection fraction - 41%.  Echocardiogram 07/05/2013 Study Conclusions Pericardium, extracardiac: Limited echo: No pericardial effusion LVE with moderately decreased EF ? 40% range. Basal inferio Wall and septum more hypokinetic than rest MAC   Lab Results  Basic Metabolic Panel:  Recent Labs Lab 11/11/2013 1733  NA 132*  K 4.5  CL 91*  CO2 27  GLUCOSE 462*  BUN 20  CREATININE 1.16*  CALCIUM 9.4    Liver Function Tests:  Recent Labs Lab 11/02/2013 1733  AST 301*  ALT 264*  ALKPHOS 265*  BILITOT 6.1*  PROT 7.4  ALBUMIN 3.0*    CBC:  Recent Labs Lab 11/16/2013 1733  WBC 12.6*  NEUTROABS 10.1*  HGB 13.7  HCT 40.9  MCV 91.7  PLT 213    Cardiac Enzymes:  Recent Labs Lab 10/30/2013 1757 11/08/2013 2118 10/24/13 0230 10/24/13 0719 10/24/13 1223  TROPONINI 0.41* 0.36* <0.30 0.31* 0.31*     Radiology: Dg  Chest 2 View  11/09/2013   CLINICAL DATA:  Pt c/o sob, cough today, states that her blood sugar was nearly 500 when EMS arrived at her home. C/o weakness, lethargy, unable to stand.  EXAM: CHEST  2 VIEW  COMPARISON:  08/11/2013  FINDINGS: Prior median sternotomy. Numerous leads and wires project over the chest. Bilateral glenohumeral joint osteoarthritis. Midline trachea. Moderate cardiomegaly. Mild right hemidiaphragm elevation. No pleural effusion or pneumothorax. No congestive failure. Mildly  low lung volumes. Clear lungs.  IMPRESSION: Cardiomegaly and mildly low lung volumes, without acute disease.   Electronically Signed   By: Jeronimo GreavesKyle  Talbot M.D.   On: 09/02/13 18:20   [place] Abdomen Limited Ruq  10/24/2013   CLINICAL DATA:  Nausea, vomiting since Friday, CHF, diabetes, high cholesterol  EXAM: US ABDOMEN LIMITED - RIGHT UPPER QUADRANT  COMPARISON:  None.  FINDINGS: Gallbladder:  Cholelithiasis without gallbladder wall thickening or pericholecystic fluid. The gallbladder is distended. No sonographic Murphy sign noted.  Common bile duct:  Diameter: 15.5 mm. Echogenic foci within the common bile duct concerning for choledocholithiasis.  Liver:  No focal lesion identified. Mild increased liver size measuring 18.9 cm in length. Increased hepatic parenchymal echogenicity.  IMPRESSION: 1. Cholelithiasis with gallbladder distention, but without pericholecystic fluid or gallbladder wall thickening. The findings are equivocal for cholecystitis. 2. Echogenic foci within a dilated common bile duct most concerning for choledocholithiasis. The common bile duct measures 15.5 mm in diameter. 3. Hepatic steatosis. 4. Mild hepatomegaly.   Electronically Signed   By: Elige KoHetal  Patel   On: 10/24/2013 11:45   ECG: SR with LBBB, mild ST-T wave depression in the lateral leads.    Impression and Recommendations  1. Elevated Troponin: Minimally elevated troponin in the setting of acute physiologic stress,cholecystitis, elevated  blood glucose with nausea and vomiting. Not true NSTEMI.   2.CAD: with NICM Hx of CABG 2003, stent to the OM. Most recent cardiac cath in Feb of 2015 demonstrated patent stent with stable coronary disease. She is an overall moderate pre-operative risk with decreased EF,and PAD, without angina. Some mild referred pain is noted.   Would continue carvedilol, ASA, peri-operatively. Echo read by Dr. Purvis SheffieldKoneswaran finds EF to be 35%,.  3. Acute Cholecystitis: Dilated CBD with cholelithiasis.  Has been seen by GI with discussion for ERCP. [name].Awaiting our recommendations. Had ERCP in Oct of 2014 with removal of stones then.   4. Carotid Artery Disease: Dr Myrtis SerKatz note from Oct of 2014 mentions severe carotid artery disease, but I do not see ultrasound report. Patient states that she was told that she could not have surgery due to carotid disease. Do not see documentation of this.   Signed: Bettey MareKathryn M. Lawrence NP  10/24/2013, 3:30 PM Co-Sign MD

## 2013-10-24 NOTE — Progress Notes (Signed)
  Echocardiogram 2D Echocardiogram has been performed.  Cathy [name] 10/24/2013, 3:51 PM

## 2013-10-25 ENCOUNTER — Encounter (HOSPITAL_COMMUNITY): Payer: Self-pay | Admitting: *Deleted

## 2013-10-25 LAB — CBC WITH DIFFERENTIAL/PLATELET
BASOS PCT: 0 % (ref 0–1)
Basophils Absolute: 0 10*3/uL (ref 0.0–0.1)
Eosinophils Absolute: 0.1 10*3/uL (ref 0.0–0.7)
Eosinophils Relative: 1 % (ref 0–5)
HEMATOCRIT: 39.2 % (ref 36.0–46.0)
HEMOGLOBIN: 12.7 g/dL (ref 12.0–15.0)
Lymphocytes Relative: 15 % (ref 12–46)
Lymphs Abs: 1.9 10*3/uL (ref 0.7–4.0)
MCH: 30.3 pg (ref 26.0–34.0)
MCHC: 32.4 g/dL (ref 30.0–36.0)
MCV: 93.6 fL (ref 78.0–100.0)
Monocytes Absolute: 1.2 10*3/uL — ABNORMAL HIGH (ref 0.1–1.0)
Monocytes Relative: 10 % (ref 3–12)
NEUTROS ABS: 9.4 10*3/uL — AB (ref 1.7–7.7)
NEUTROS PCT: 74 % (ref 43–77)
Platelets: 202 10*3/uL (ref 150–400)
RBC: 4.19 MIL/uL (ref 3.87–5.11)
RDW: 15.7 % — ABNORMAL HIGH (ref 11.5–15.5)
WBC: 12.6 10*3/uL — AB (ref 4.0–10.5)

## 2013-10-25 LAB — GLUCOSE, CAPILLARY
GLUCOSE-CAPILLARY: 144 mg/dL — AB (ref 70–99)
GLUCOSE-CAPILLARY: 253 mg/dL — AB (ref 70–99)
Glucose-Capillary: 226 mg/dL — ABNORMAL HIGH (ref 70–99)
Glucose-Capillary: 253 mg/dL — ABNORMAL HIGH (ref 70–99)

## 2013-10-25 LAB — COMPREHENSIVE METABOLIC PANEL
ALK PHOS: 210 U/L — AB (ref 39–117)
ALT: 114 U/L — ABNORMAL HIGH (ref 0–35)
AST: 46 U/L — ABNORMAL HIGH (ref 0–37)
Albumin: 2.7 g/dL — ABNORMAL LOW (ref 3.5–5.2)
Anion gap: 12 (ref 5–15)
BUN: 16 mg/dL (ref 6–23)
CO2: 27 mEq/L (ref 19–32)
Calcium: 8.7 mg/dL (ref 8.4–10.5)
Chloride: 102 mEq/L (ref 96–112)
Creatinine, Ser: 0.9 mg/dL (ref 0.50–1.10)
GFR calc non Af Amer: 58 mL/min — ABNORMAL LOW (ref >90)
GFR, EST AFRICAN AMERICAN: 68 mL/min — AB (ref >90)
Glucose, Bld: 165 mg/dL — ABNORMAL HIGH (ref 70–99)
Potassium: 3.8 mEq/L (ref 3.7–5.3)
Sodium: 141 mEq/L (ref 137–147)
TOTAL PROTEIN: 7.1 g/dL (ref 6.0–8.3)
Total Bilirubin: 1.8 mg/dL — ABNORMAL HIGH (ref 0.3–1.2)

## 2013-10-25 NOTE — Progress Notes (Signed)
Report given to [facility].  ETA to [facility] 20 minutes.

## 2013-10-25 NOTE — Progress Notes (Signed)
Subjective: Tmax 101.4. Mild nausea this morning, no vomiting. No abdominal pain.   Objective: Vital signs in last 24 hours: Temp:  [100.7 F (38.2 C)-101.4 F (38.6 C)] 101.4 F (38.6 C) (10/06 0556) Pulse Rate:  [78-140] 89 (10/06 0556) Resp:  [18] 18 (10/06 0556) BP: (129-152)/(59-62) 137/62 mmHg (10/06 0556) SpO2:  [92 %-95 %] 95 % (10/06 0556) Last BM Date: 10/24/13 General:   Alert and oriented, pleasant Head:  Normocephalic and atraumatic. Eyes:  No icterus, sclera clear. Conjuctiva pink.  Abdomen:  Bowel sounds present, soft, TTP epigastric and RUQ Neurologic:  Alert and  oriented x4;  grossly normal neurologically. Psych:  Alert and cooperative. Normal mood and affect.  Intake/Output from previous day: 10/05 0701 - 10/06 0700 In: 1500 [I.V.:1350; IV Piggyback:150] Out: 500 [Urine:500] Intake/Output this shift:    Lab Results:  Recent Labs  10/22/2013 1733 10/25/13 0619  WBC 12.6* 12.6*  HGB 13.7 12.7  HCT 40.9 39.2  PLT 213 202   BMET  Recent Labs  11/17/2013 1733 10/25/13 0619  NA 132* 141  K 4.5 3.8  CL 91* 102  CO2 27 27  GLUCOSE 462* 165*  BUN 20 16  CREATININE 1.16* 0.90  CALCIUM 9.4 8.7   LFT  Recent Labs  10/26/2013 1733 10/24/13 1430 10/25/13 0619  PROT 7.4 7.2 7.1  ALBUMIN 3.0* 3.0* 2.7*  AST 301* 186* 46*  ALT 264* 211* 114*  ALKPHOS 265* 263* 210*  BILITOT 6.1* 4.7* 1.8*  BILIDIR  --  3.5*  --   IBILI  --  1.2*  --      Studies/Results: Dg Chest 2 View  11/05/2013   CLINICAL DATA:  Pt c/o sob, cough today, states that her blood sugar was nearly 500 when EMS arrived at her home. C/o weakness, lethargy, unable to stand.  EXAM: CHEST  2 VIEW  COMPARISON:  08/11/2013  FINDINGS: Prior median sternotomy. Numerous leads and wires project over the chest. Bilateral glenohumeral joint osteoarthritis. Midline trachea. Moderate cardiomegaly. Mild right hemidiaphragm elevation. No pleural effusion or pneumothorax. No congestive failure.  Mildly low lung volumes. Clear lungs.  IMPRESSION: Cardiomegaly and mildly low lung volumes, without acute disease.   Electronically Signed   By: Jeronimo GreavesKyle  Talbot M.D.   On: 10/21/2013 18:20   Dg Abd Portable 1v  10/24/2013   CLINICAL DATA:  Dilated common bile duct concerning for choledocholithiasis. Acute onset of nausea prior to admission. Right upper quadrant pain.  EXAM: PORTABLE ABDOMEN - 1 VIEW  COMPARISON:  07/27/2013, CT.  FINDINGS: Multiple surgical clips are demonstrated throughout the abdomen. There is relative paucity of small bowel gas, limiting evaluation. Gas is demonstrated throughout the colon. Stool throughout the descending colon and rectum. Supine evaluation limited for the detection of free intraperitoneal air. Marked degenerative change of the lower lumbar spine.  IMPRESSION: Paucity of small bowel gas. No definite evidence for small bowel obstruction.  Stool throughout the descending colon and rectum.   Electronically Signed   By: Annia Beltrew  Davis M.D.   On: 10/24/2013 15:28   [place] Abdomen Limited Ruq  10/24/2013   CLINICAL DATA:  Nausea, vomiting since Friday, CHF, diabetes, high cholesterol  EXAM: US ABDOMEN LIMITED - RIGHT UPPER QUADRANT  COMPARISON:  None.  FINDINGS: Gallbladder:  Cholelithiasis without gallbladder wall thickening or pericholecystic fluid. The gallbladder is distended. No sonographic Murphy sign noted.  Common bile duct:  Diameter: 15.5 mm. Echogenic foci within the common bile duct concerning for choledocholithiasis.  Liver:  No focal lesion identified. Mild increased liver size measuring 18.9 cm in length. Increased hepatic parenchymal echogenicity.  IMPRESSION: 1. Cholelithiasis with gallbladder distention, but without pericholecystic fluid or gallbladder wall thickening. The findings are equivocal for cholecystitis. 2. Echogenic foci within a dilated common bile duct most concerning for choledocholithiasis. The common bile duct measures 15.5 mm in diameter. 3. Hepatic  steatosis. 4. Mild hepatomegaly.   Electronically Signed   By: Elige Ko   On: 10/24/2013 11:45    Assessment: 80 year old female with likely cholangitis secondary to choledocholithiasis noted on [place] of abdomen and course complicated by significant cardiac history. Elevated troponin on admission due to multiple factors but not thought to be true NSTEMI and consistent with demand ischemia; at moderate risk for major adverse perioperative cardiac event. Unasyn for antibiotic coverage. LFTs improving.   Needs ERCP, which will be discussed with anesthesiology. Will need to hold Lovenox and pre-medicate with Prednisone 50 mg at 13 hours prior to procedure, 7 hours prior to procedure, and 1 hour prior to procedure, Benadryl 50 mg 1 hour prior to procedure due to dye allergy.    Plan: PPI  Continue Unasyn  Further recommendations after discussion with anesthesia regarding ERCP Will need premedication due to dye allergy Will need to hold Lovenox Continue clears for now   Nira Retort, ANP-BC [facility]       LOS: 2 days    10/25/2013, 7:50 AM

## 2013-10-25 NOTE — Progress Notes (Signed)
Inpatient Diabetes Program Recommendations  AACE/ADA: New Consensus Statement on Inpatient Glycemic Control (2013)  Target Ranges:  Prepandial:   less than 140 mg/dL      Peak postprandial:   less than 180 mg/dL (1-2 hours)      Critically ill patients:  140 - 180 mg/dL   Results for Cathy [name], Cathy [name] (MRN [id number]) as of 10/25/2013 09:07  Ref. Range 10/24/2013 08:34 10/24/2013 14:22 10/24/2013 17:08 10/24/2013 20:20 10/25/2013 07:25  Glucose-Capillary Latest Range: 70-99 mg/dL 119298 [name] 147276 [name] 829244 [name] 241 [name] 144 [name]   Diabetes history: DM2 Outpatient Diabetes medications: Lantus 70 units QHS, Humalog 16-20 units QID Current orders for Inpatient glycemic control: Lantus 70 units QHS, Novolog 0-9 units AC, Novolog 0-5 units HS  Inpatient Diabetes Program Recommendations Correction (SSI): Please consider increasing Novolog correction to Resistant scale ACHS.  Thanks, Orlando PennerMarie Vitor Overbaugh, RN, MSN, CCRN Diabetes Coordinator Inpatient Diabetes Program [phone] (Team Pager) [phone] ([facility] office) [phone] [facility] office)

## 2013-10-25 NOTE — Progress Notes (Signed)
REVIEWED-PT WITH MULTIPLE CO-MORBIDITIES(SEVERE R ICA STENOSIS, CHF, GNR BACTEREMIA)  . CARDIOLOGY SEEN AND ASSESSED AS MODERATE PERIOPERATIVE RISK. PT DID NOT HAVE GB REMOVED DUE TO R ICA STENOSIS. DISCUSSED WITH DR. GONZALEZ. PT NEED PERIOPERATIVE SPECIALTIES AVAILABLE: NEUROLOGY, VASCULAR, IR, CARDIOLOGY). PT TOO HIGH RISK FOR ERCP AT [facility]. [phone] CASE REVIEWED AND DISCUSSED WITH DR. Dulce SellarUTLAW.

## 2013-10-25 NOTE — Progress Notes (Signed)
Report called to Mardelle MatteAndy, RN [facility].

## 2013-10-25 NOTE — Progress Notes (Signed)
Pharmacist Heart Failure Core Measure Documentation  Assessment: Cathy [name] has an EF documented as 30-35% on 10/24/13 by ECHO.  Rationale: Heart failure patients with left ventricular systolic dysfunction (LVSD) and an EF < 40% should be prescribed an angiotensin converting enzyme inhibitor (ACEI) or angiotensin receptor blocker (ARB) at discharge unless a contraindication is documented in the medical record.  This patient is not currently on an ACEI or ARB for HF.  This note is being placed in the record in order to provide documentation that a contraindication to the use of these agents is present for this encounter.  ACE Inhibitor or Angiotensin Receptor Blocker is contraindicated (specify all that apply)  []   ACEI allergy AND ARB allergy []   Angioedema []   Moderate or severe aortic stenosis []   Hyperkalemia []   Hypotension []   Renal artery stenosis [x]   Worsening renal function, preexisting renal disease or dysfunction   Elson ClanLilliston, Jaun Galluzzo Michelle 10/25/2013 11:50 AM

## 2013-10-25 NOTE — Progress Notes (Signed)
[facility] PROGRESS NOTE  Cathy [name] OFB:[id number] DOB: 1930/11/22 DOA: 10/20/2013 PCP: Vic Blackbird, MD  Off Service Summary [id number] with hx of gallstones with prior admissions for cholecystits/cholelithiasis who presents to with intractable n/v and abd pain. LFT's were noted to be elevated and pt was found to have multiple gallstones in a distended gallbladder with a dilated CBD. The patient has known CAD and carotid disease and was found to have a slight bump in trop to 0.4 on admit. She was seen by Cardiology who recommended an outpatient stress test. She was also considered to be at mod to high risk for adverse cardiac perioperative complications. As such, GI has recommended a transfer to [facility] to continue her care. Dr. Oneida Alar has since discussed the case with Dr. Paulita Fujita.  Assessment/Plan: 1. Acute cholecystits 1. Multiple prior admissions for similar events, most recent in 7/15 where pt's LFT's trended back to normal and pt was recommended to follow up with General Surgery as an outpatient. Unclear if patient went to that follow up visit. 2. Markedly elevated alk phos, ast/alt on admit with total bili of 6 3. LFT's slowly trending down 4. RUQ [place] with multiple gallstones equivocal for cholecystitis and dilated CBD 5. Cont on Unasyn per below 6. Cont with antiemetics as tolerated 7. Have asked Cardiology to comment on cardiac clearance - Pt is at mod to major risk for peri-operative cardiac event with recs for ASA and Coreg 8. GI has been following, however given high risk for adverse events, GI recommends transfer to [facility] to continue work up. Dr. Oneida Alar has discussed case with Dr. Paulita Fujita 2. ?NSTEMI 1. Peak trop of 0.41 on admit, since trended down to normal 2. ?Depressions noted on EKG 3. Remains asymptomatic 4. Have discussed with Cardiology, likely trop leak, recs for outpatient stress and f/u with Dr. Ron Parker 3. ARF 1. Will cont to monitor renal fx 4. DM2 1. On 70 units  lantus with SSI coverage 2. BS still elevated, but improving 5. Hyponatremia 1. Likely secondary to markedly elevated glucose on admission 2. Monitor closely 3. Correct glucose 6. Gm neg bacteremia 1. Cont on Unasyn 2. Likely secondary to acute cholecystitis 7. DVT prophylaxis 1. Lovenox suQ  Code Status: Full Family Communication: Pt in room Disposition Plan: Transfer to [facility]   Consultants:  Cardiology  GI  Procedures:    Antibiotics:  Clindamycin (home regimen) 10/4>>>10/5  Unasyn 10/5>>>  HPI/Subjective: Cont with abd pain  Objective: Filed Vitals:   10/24/13 1730 10/24/13 1756 10/24/13 2046 10/25/13 0556  BP:  152/59 129/62 137/62  Pulse: 130 78 80 89  Temp:   100.7 F (38.2 C) 101.4 F (38.6 C)  TempSrc:   Oral Oral  Resp:   18 18  Height:      Weight:      SpO2:   92% 95%    Intake/Output Summary (Last 24 hours) at 10/25/13 0926 Last data filed at 10/25/13 0100  Gross per 24 hour  Intake   1500 ml  Output    500 ml  Net   1000 ml   Filed Weights   11/12/2013 1732 10/24/13 0614  Weight: 89.812 kg (198 lb) 89.785 kg (197 lb 15 oz)   Exam:  General:  Awake, appears to be in pain  Cardiovascular: regular, s1, s2  Respiratory: normal resp effort, no wheezing  Abdomen: soft, decreased BS, generally tender throughout  Musculoskeletal: perfused, no clubbing   Data Reviewed: Basic Metabolic Panel:  Recent Labs Lab  10/28/2013 1733 10/24/13 1430 10/25/13 0619  NA 132*  --  141  K 4.5  --  3.8  CL 91*  --  102  CO2 27  --  27  GLUCOSE 462*  --  165*  BUN 20  --  16  CREATININE 1.16*  --  0.90  CALCIUM 9.4  --  8.7  MG  --  2.0  --    Liver Function Tests:  Recent Labs Lab 11/07/2013 1733 10/24/13 1430 10/25/13 0619  AST 301* 186* 46*  ALT 264* 211* 114*  ALKPHOS 265* 263* 210*  BILITOT 6.1* 4.7* 1.8*  PROT 7.4 7.2 7.1  ALBUMIN 3.0* 3.0* 2.7*    Recent Labs Lab 10/24/13 1430  LIPASE 17   No results found for this  basename: AMMONIA,  in the last 168 hours CBC:  Recent Labs Lab 10/25/2013 1733 10/25/13 0619  WBC 12.6* 12.6*  NEUTROABS 10.1* 9.4*  HGB 13.7 12.7  HCT 40.9 39.2  MCV 91.7 93.6  PLT 213 202   Cardiac Enzymes:  Recent Labs Lab 10/28/2013 1757 11/12/2013 2118 10/24/13 0230 10/24/13 0719 10/24/13 1223  CKTOTAL  --   --  121  --   --   CKMB  --   --  3.4  --   --   TROPONINI 0.41* 0.36* <0.30 0.31* 0.31*   BNP (last 3 results)  Recent Labs  03/12/13 1830 10/22/2013 1757  PROBNP 702.3* 2918.0*   CBG:  Recent Labs Lab 10/24/13 0834 10/24/13 1422 10/24/13 1708 10/24/13 2020 10/25/13 0725  GLUCAP 298* 276* 244* 241* 144*    Recent Results (from the past 240 hour(s))  CULTURE, BLOOD (ROUTINE X 2)     Status: None   Collection Time    10/28/2013  9:12 PM      Result Value Ref Range Status   Specimen Description BLOOD LEFT ANTECUBITAL   Final   Special Requests BOTTLES DRAWN AEROBIC ONLY [facility]   Final   Culture  Setup Time     Final   Value: 10/24/2013 14:30     Performed at [facility]   Culture     Final   Value: GRAM NEGATIVE RODS     Note: Gram Stain Report Called to,Read Back By and Verified With: TANDON R 10/24/13 1034 BY THOMPSON S Performed at [facility]     Performed at [facility]   Report Status PENDING   Incomplete  CULTURE, BLOOD (ROUTINE X 2)     Status: None   Collection Time    10/24/2013  9:18 PM      Result Value Ref Range Status   Specimen Description BLOOD LEFT HAND   Final   Special Requests BOTTLES DRAWN AEROBIC AND ANAEROBIC [facility]   Final   Culture  Setup Time     Final   Value: 10/24/2013 08:05     Performed at [facility]   Culture     Final   Value: GRAM NEGATIVE RODS     Note: Gram Stain Report Called to,Read Back By and Verified With: RANDOM R 10/24/13 0853 BY HUFFINES S Performed at [facility]     Performed at [facility]   Report Status PENDING   Incomplete     Studies: Dg Chest 2  View  11/05/2013   CLINICAL DATA:  Pt c/o sob, cough today, states that her blood sugar was nearly 500 when EMS arrived at her home. C/o weakness, lethargy, unable  to stand.  EXAM: CHEST  2 VIEW  COMPARISON:  08/11/2013  FINDINGS: Prior median sternotomy. Numerous leads and wires project over the chest. Bilateral glenohumeral joint osteoarthritis. Midline trachea. Moderate cardiomegaly. Mild right hemidiaphragm elevation. No pleural effusion or pneumothorax. No congestive failure. Mildly low lung volumes. Clear lungs.  IMPRESSION: Cardiomegaly and mildly low lung volumes, without acute disease.   Electronically Signed   By: Abigail Miyamoto M.D.   On: 10/27/2013 18:20   Dg Abd Portable 1v  10/24/2013   CLINICAL DATA:  Dilated common bile duct concerning for choledocholithiasis. Acute onset of nausea prior to admission. Right upper quadrant pain.  EXAM: PORTABLE ABDOMEN - 1 VIEW  COMPARISON:  07/27/2013, CT.  FINDINGS: Multiple surgical clips are demonstrated throughout the abdomen. There is relative paucity of small bowel gas, limiting evaluation. Gas is demonstrated throughout the colon. Stool throughout the descending colon and rectum. Supine evaluation limited for the detection of free intraperitoneal air. Marked degenerative change of the lower lumbar spine.  IMPRESSION: Paucity of small bowel gas. No definite evidence for small bowel obstruction.  Stool throughout the descending colon and rectum.   Electronically Signed   By: Lovey Newcomer M.D.   On: 10/24/2013 15:28   US Abdomen Limited Ruq  10/24/2013   CLINICAL DATA:  Nausea, vomiting since Friday, CHF, diabetes, high cholesterol  EXAM: US ABDOMEN LIMITED - RIGHT UPPER QUADRANT  COMPARISON:  None.  FINDINGS: Gallbladder:  Cholelithiasis without gallbladder wall thickening or pericholecystic fluid. The gallbladder is distended. No sonographic Murphy sign noted.  Common bile duct:  Diameter: 15.5 mm. Echogenic foci within the common bile duct concerning for  choledocholithiasis.  Liver:  No focal lesion identified. Mild increased liver size measuring 18.9 cm in length. Increased hepatic parenchymal echogenicity.  IMPRESSION: 1. Cholelithiasis with gallbladder distention, but without pericholecystic fluid or gallbladder wall thickening. The findings are equivocal for cholecystitis. 2. Echogenic foci within a dilated common bile duct most concerning for choledocholithiasis. The common bile duct measures 15.5 mm in diameter. 3. Hepatic steatosis. 4. Mild hepatomegaly.   Electronically Signed   By: Kathreen Devoid   On: 10/24/2013 11:45    Scheduled Meds: . ampicillin-sulbactam (UNASYN) IV  1.5 g Intravenous Q6H  . aspirin EC  81 mg Oral q morning - 10a  . carvedilol  12.5 mg Oral BID WC  . enoxaparin (LOVENOX) injection  1 mg/kg Subcutaneous Q12H  . folic acid  1 mg Oral Daily  . furosemide  40 mg Oral Daily  . insulin aspart  0-5 Units Subcutaneous QHS  . insulin aspart  0-9 Units Subcutaneous TID WC  . insulin glargine  70 Units Subcutaneous QHS  . levothyroxine  25 mcg Oral QAC breakfast  . loratadine  10 mg Oral Daily  . meclizine  25 mg Oral BID  . ondansetron (ZOFRAN) IV  4 mg Intravenous TID WC & HS  . pantoprazole  40 mg Oral Daily  . sodium chloride  3 mL Intravenous Q12H  . sodium chloride  3 mL Intravenous Q12H  . tamsulosin  0.4 mg Oral Daily   Continuous Infusions: . sodium chloride 75 mL/hr at 11/10/2013 1830    Active Problems:   Hyperglycemia   Nausea with vomiting   NSTEMI (non-ST elevated myocardial infarction)  Time spent: 25min  [facility], [place] [facility] Pager [phone]. If 7PM-7AM, please contact night-coverage at [email or web address], password [facility] 10/25/2013, 9:26 AM  LOS: 2 days

## 2013-10-25 NOTE — Clinical Documentation Improvement (Signed)
Presents with Acute Choledocholithiasis; CKD is documented.   White female  GFR has ranged from 43 to 8058 during this admission   Please clarify the Stage of CKD from the list below and document findings in next progress note and discharge summary.  _______CKD Stage I - GFR > OR = 90 _______CKD Stage II - GFR 60-80 _______CKD Stage III - GFR 30-59 _______CKD Stage IV - GFR 15-29 _______CKD Stage V - GFR < 15 _______ESRD (End Stage Renal Disease) _______Other condition_____________  Jessie Foothank You, Daysen Gundrum T Jowana Thumma ,RN Clinical Documentation Specialist:  [phone]  [facility]- Health Information Management

## 2013-10-26 DIAGNOSIS — R7881 Bacteremia: Secondary | ICD-10-CM | POA: Diagnosis present

## 2013-10-26 DIAGNOSIS — R7989 Other specified abnormal findings of blood chemistry: Secondary | ICD-10-CM

## 2013-10-26 DIAGNOSIS — M19019 Primary osteoarthritis, unspecified shoulder: Secondary | ICD-10-CM | POA: Diagnosis present

## 2013-10-26 DIAGNOSIS — K8309 Other cholangitis: Secondary | ICD-10-CM | POA: Diagnosis present

## 2013-10-26 DIAGNOSIS — B9620 Unspecified Escherichia coli [E. coli] as the cause of diseases classified elsewhere: Secondary | ICD-10-CM | POA: Diagnosis present

## 2013-10-26 LAB — CULTURE, BLOOD (ROUTINE X 2)

## 2013-10-26 LAB — URINE CULTURE: Colony Count: 75000

## 2013-10-26 LAB — GLUCOSE, CAPILLARY
GLUCOSE-CAPILLARY: 177 mg/dL — AB (ref 70–99)
Glucose-Capillary: 179 mg/dL — ABNORMAL HIGH (ref 70–99)
Glucose-Capillary: 194 mg/dL — ABNORMAL HIGH (ref 70–99)
Glucose-Capillary: 179 mg/dL — ABNORMAL HIGH (ref 70–99)

## 2013-10-26 MED ORDER — INSULIN ASPART 100 UNIT/ML ~~LOC~~ SOLN
0.0000 [IU] | Freq: Three times a day (TID) | SUBCUTANEOUS | Status: DC
  Administered 2013-10-26: 4 [IU] via SUBCUTANEOUS

## 2013-10-26 MED ORDER — LACTATED RINGERS IV SOLN
INTRAVENOUS | Status: DC
  Administered 2013-10-27: 1000 mL via INTRAVENOUS

## 2013-10-26 MED ORDER — PROMETHAZINE HCL 25 MG/ML IJ SOLN: 12.5000 mg | Freq: Four times a day (QID) | INTRAMUSCULAR | Status: DC | PRN

## 2013-10-26 MED ORDER — INSULIN ASPART 100 UNIT/ML ~~LOC~~ SOLN: 0.0000 [IU] | Freq: Every day | SUBCUTANEOUS | Status: DC

## 2013-10-26 MED ORDER — LISINOPRIL 2.5 MG PO TABS
2.5000 mg | ORAL_TABLET | Freq: Every day | ORAL | Status: DC
  Administered 2013-10-26: 2.5 mg via ORAL
  Filled 2013-10-26 (×2): qty 1

## 2013-10-26 NOTE — Progress Notes (Signed)
Subjective: Some pain overnight, no pain today. Nauseated now.  Objective: Vital signs in last 24 hours: Temp:  [97.9 F (36.6 C)-99.8 F (37.7 C)] 99.3 F (37.4 C) (10/07 0738) Pulse Rate:  [66-114] 79 (10/07 0841) Resp:  [18-21] 21 (10/07 0738) BP: (117-145)/(53-88) 144/68 mmHg (10/07 0841) SpO2:  [93 %-96 %] 93 % (10/07 0738) Weight:  [91.6 kg (201 lb 15.1 oz)] 91.6 kg (201 lb 15.1 oz) (10/06 2013) Weight change:  Last BM Date: 10/24/13  PE: GEN:  NAD, sleepy but arousable ABD:  Soft, mild generalized upper abdominal tenderness.  Lab Results: CMP     Component Value Date/Time   NA 141 10/25/2013 0619   K 3.8 10/25/2013 0619   CL 102 10/25/2013 0619   CO2 27 10/25/2013 0619   GLUCOSE 165* 10/25/2013 0619   BUN 16 10/25/2013 0619   CREATININE 0.90 10/25/2013 0619   CREATININE 0.94 12/29/2012 1620   CALCIUM 8.7 10/25/2013 0619   PROT 7.1 10/25/2013 0619   ALBUMIN 2.7* 10/25/2013 0619   AST 46* 10/25/2013 0619   ALT 114* 10/25/2013 0619   ALKPHOS 210* 10/25/2013 0619   BILITOT 1.8* 10/25/2013 0619   GFRNONAA 58* 10/25/2013 0619   GFRNONAA 57* 12/29/2012 1620   GFRAA 68* 10/25/2013 0619   GFRAA 66 12/29/2012 1620   CBC    Component Value Date/Time   WBC 12.6* 10/25/2013 0619   RBC 4.19 10/25/2013 0619   HGB 12.7 10/25/2013 0619   HCT 39.2 10/25/2013 0619   PLT 202 10/25/2013 0619   MCV 93.6 10/25/2013 0619   MCH 30.3 10/25/2013 0619   MCHC 32.4 10/25/2013 0619   RDW 15.7* 10/25/2013 0619   LYMPHSABS 1.9 10/25/2013 0619   MONOABS 1.2* 10/25/2013 0619   EOSABS 0.1 10/25/2013 0619   BASOSABS 0.0 10/25/2013 0619   Lipase     Component Value Date/Time   LIPASE 17 10/24/2013 1430     Studies/Results: Dg Abd Portable 1v  10/24/2013   CLINICAL DATA:  Dilated common bile duct concerning for choledocholithiasis. Acute onset of nausea prior to admission. Right upper quadrant pain.  EXAM: PORTABLE ABDOMEN - 1 VIEW  COMPARISON:  07/27/2013, CT.  FINDINGS: Multiple surgical clips are  demonstrated throughout the abdomen. There is relative paucity of small bowel gas, limiting evaluation. Gas is demonstrated throughout the colon. Stool throughout the descending colon and rectum. Supine evaluation limited for the detection of free intraperitoneal air. Marked degenerative change of the lower lumbar spine.  IMPRESSION: Paucity of small bowel gas. No definite evidence for small bowel obstruction.  Stool throughout the descending colon and rectum.   Electronically Signed   By: Annia Beltrew  Davis M.D.   On: 10/24/2013 15:28   [place] Abdomen Limited Ruq  10/24/2013   CLINICAL DATA:  Nausea, vomiting since Friday, CHF, diabetes, high cholesterol  EXAM: US ABDOMEN LIMITED - RIGHT UPPER QUADRANT  COMPARISON:  None.  FINDINGS: Gallbladder:  Cholelithiasis without gallbladder wall thickening or pericholecystic fluid. The gallbladder is distended. No sonographic Murphy sign noted.  Common bile duct:  Diameter: 15.5 mm. Echogenic foci within the common bile duct concerning for choledocholithiasis.  Liver:  No focal lesion identified. Mild increased liver size measuring 18.9 cm in length. Increased hepatic parenchymal echogenicity.  IMPRESSION: 1. Cholelithiasis with gallbladder distention, but without pericholecystic fluid or gallbladder wall thickening. The findings are equivocal for cholecystitis. 2. Echogenic foci within a dilated common bile duct most concerning for choledocholithiasis. The common bile duct measures 15.5 mm in diameter. 3.  Hepatic steatosis. 4. Mild hepatomegaly.   Electronically Signed   By: Elige Ko   On: 10/24/2013 11:45   Assessment:  1.  Gram-negative bacteremia, with elevated LFTs, most concerning for cholangitis. 2.  Elevated LFTs, downtrending. 3.  Abdominal pain, most consistent with cholelithiasis and choledocholithiasis. 4.  Multiple medical problems.  Plan:  1.  Continue antibiotics. 2.  Clear liquid diet, NPO after midnight. 3.  Repeat ERCP tomorrow,  11/16/2013.   Freddy Jaksch 10/26/2013, 9:53 AM

## 2013-10-26 NOTE — Progress Notes (Signed)
Inpatient Diabetes Program Recommendations  AACE/ADA: New Consensus Statement on Inpatient Glycemic Control (2013)  Target Ranges:  Prepandial:   less than 140 mg/dL      Peak postprandial:   less than 180 mg/dL (1-2 hours)      Critically ill patients:  140 - 180 mg/dL     Results for Cathy [name], Cathy [name] (MRN [id number]) as of 10/26/2013 07:16  Ref. Range 10/25/2013 07:25 10/25/2013 11:02 10/25/2013 16:58 10/25/2013 20:03  Glucose-Capillary Latest Range: 70-99 mg/dL 409144 [name] 811226 [name] 914253 [name] 253 [name]     Inpatient Diabetes Program Recommendations Correction (SSI): Please consider increasing Novolog correction to Resistant scale ACHS.    Will follow Ambrose FinlandJeannine Johnston Adean Milosevic RN, MSN, CDE Diabetes Coordinator Inpatient Diabetes Program Team Pager: [phone] (8a-10p)

## 2013-10-26 NOTE — Progress Notes (Signed)
NP made aware of HR sustaining at 120-140's with base rhythm  of afib. No complain made. Resting in bed at present. Will continue to monitor.

## 2013-10-26 NOTE — Progress Notes (Signed)
CARE MANAGEMENT NOTE 10/26/2013  Patient:  Vella KohlerELSON,Kalika H   Account Number:  [id number]  Date Initiated:  10/24/2013  Documentation initiated by:  Anibal HendersonBOLDEN,GENEVA  Subjective/Objective Assessment:   Admitted with N&V NSTEMI, ARF,  + blood cultures, + cholecystitis, hypoglycemia. Pt is from home, lives alone with assistance of friends and has an aide     Action/Plan:   May need surgery-- Will  need HH vs SNF- will follow   Anticipated DC Date:  10/25/2013   Anticipated DC Plan:  ACUTE TO ACUTE TRANS      DC Planning Services  CM consult      Choice offered to / List presented to:             Status of service:  Completed, signed off Medicare Important Message given?  YES (If response is "NO", the following Medicare IM given date fields will be blank) Date Medicare IM given:  10/26/2013 Medicare IM given by:  Darlyne RussianOLAND,Loyal Holzheimer Date Additional Medicare IM given:   Additional Medicare IM given by:    Discharge Disposition:  ACUTE TO ACUTE TRANS  Per UR Regulation:  Reviewed for med. necessity/level of care/duration of stay  If discussed at Long Length of Stay Meetings, dates discussed:    Comments:  10/26/2013 [address] Latron Ribas RN, [place] [phone] NCM to continue to follow for discharge planning.  10/25/13 1200 Geneva Bolden RN/RN Pt does not have an aide, [facility]---   [facility] 10/24/13 1630 Anibal HendersonGeneva Bolden RN/CM

## 2013-10-26 NOTE — Progress Notes (Signed)
Chart reviewed.  [facility] PROGRESS NOTE  Cathy [name] QPR:[id number] DOB: 30-May-1930 DOA: 11/05/2013 PCP: Vic Blackbird, MD  Off Service Summary [id number] with hx of gallstones with prior admissions for cholecystits/cholelithiasis who presents to with intractable n/v and abd pain. LFT's were noted to be elevated and pt was found to have multiple gallstones in a distended gallbladder with a dilated CBD. The patient has known CAD and carotid disease and was found to have a slight bump in trop to 0.4 on admit. She was seen by Cardiology who recommended an outpatient stress test. She was also considered to be at mod to high risk for adverse cardiac perioperative complications. As such, GI has recommended a transfer to [facility] to continue her care. Dr. Oneida Alar has since discussed the case with Dr. Paulita Fujita.  Assessment/Plan: 1. Choledocholithiasis with cholangitis 1. Markedly elevated alk phos, ast/alt on admit with total bili of 6 2. LFT's slowly trending down 3. RUQ [place] with multiple gallstones equivocal for cholecystitis and dilated CBD 4. Cont Unasyn  5. Cont with antiemetics as tolerated 6.  Pt is at mod to major risk for peri-operative cardiac event with recs for ASA and Coreg 7. For ERCP tomorrow 2. Elevated troponin 1. Peak troponin0.41 on admit, since trended down to normal 2. Remains asymptomatic 3. Have discussed with Cardiology, they do not feel NSTEMI, recs for outpatient stress and f/u with Dr. Ron Parker 3. DM2 1. Adjust SSI. Still suboptimal control 4. Hyponatremia 1. corrected 5. E Coli bacteremia 1. Sensitive to unasyn.  2. Likely secondary to cholangitis 6. Chronic systolic heart failure. Saline lock. No evidence of acute CHF. Continue coreg. Add ace inhibitor and monitor BMETs 7. Cholelithiasis: per patient, was not cleared for cholecystectomy by Dr. Ron Parker due to carotid disease. 8. OA shoulder: pain medication  Code Status: Full Family Communication:  Disposition  Plan:    Consultants:  Cardiology  GI  Procedures:    Antibiotics:  Clindamycin (home regimen) 10/4>>>10/5  Unasyn 10/5>>>  HPI/Subjective: No abdominal pain. No dyspnea. Remains nauseated c/o chronic shoulder pain from arthritis  Objective: Filed Vitals:   10/26/13 0206 10/26/13 0500 10/26/13 0738 10/26/13 0841  BP: 117/63 118/55 133/53 144/68  Pulse: 66 114 83 79  Temp: 99.8 F (37.7 C) 99.6 F (37.6 C) 99.3 F (37.4 C)   TempSrc: Oral Oral Oral   Resp: $Remo'18 18 21   'YVQCj$ Height:      Weight:      SpO2: 95% 93% 93%     Intake/Output Summary (Last 24 hours) at 10/26/13 1506 Last data filed at 10/26/13 1108  Gross per 24 hour  Intake   1270 ml  Output    625 ml  Net    645 ml   Filed Weights   10/20/2013 1732 10/24/13 0614 10/25/13 2013  Weight: 89.812 kg (198 lb) 89.785 kg (197 lb 15 oz) 91.6 kg (201 lb 15.1 oz)   Exam:  General:  Awake, uncomfortable. jaundiced  Cardiovascular: regular, s1, s2 no MGR  Respiratory: normal resp effort, no wheezing, rhonchi or rales.  Abdomen: soft, decreased BS, nontender  Musculoskeletal: perfused, no clubbing   Data Reviewed: Basic Metabolic Panel:  Recent Labs Lab 10/26/2013 1733 10/24/13 1430 10/25/13 0619  NA 132*  --  141  K 4.5  --  3.8  CL 91*  --  102  CO2 27  --  27  GLUCOSE 462*  --  165*  BUN 20  --  16  CREATININE 1.16*  --  0.90  CALCIUM 9.4  --  8.7  MG  --  2.0  --    Liver Function Tests:  Recent Labs Lab 11/15/2013 1733 10/24/13 1430 10/25/13 0619  AST 301* 186* 46*  ALT 264* 211* 114*  ALKPHOS 265* 263* 210*  BILITOT 6.1* 4.7* 1.8*  PROT 7.4 7.2 7.1  ALBUMIN 3.0* 3.0* 2.7*    Recent Labs Lab 10/24/13 1430  LIPASE 17   No results found for this basename: AMMONIA,  in the last 168 hours CBC:  Recent Labs Lab 10/29/2013 1733 10/25/13 0619  WBC 12.6* 12.6*  NEUTROABS 10.1* 9.4*  HGB 13.7 12.7  HCT 40.9 39.2  MCV 91.7 93.6  PLT 213 202   Cardiac Enzymes:  Recent Labs Lab  11/09/2013 1757 10/29/2013 2118 10/24/13 0230 10/24/13 0719 10/24/13 1223  CKTOTAL  --   --  121  --   --   CKMB  --   --  3.4  --   --   TROPONINI 0.41* 0.36* <0.30 0.31* 0.31*   BNP (last 3 results)  Recent Labs  03/12/13 1830 10/29/2013 1757  PROBNP 702.3* 2918.0*   CBG:  Recent Labs Lab 10/25/13 1102 10/25/13 1658 10/25/13 2003 10/26/13 0737 10/26/13 1211  GLUCAP 226* 253* 253* 179* 194*    Recent Results (from the past 240 hour(s))  URINE CULTURE     Status: None   Collection Time    11/02/2013  8:59 PM      Result Value Ref Range Status   Specimen Description URINE, CLEAN CATCH   Final   Special Requests NONE   Final   Culture  Setup Time     Final   Value: 10/24/2013 14:44     Performed at [place]     Final   Value: 75,000 COLONIES/ML     Performed at [facility]   Culture     Final   Value: ESCHERICHIA COLI     Performed at [facility]   Report Status 10/26/2013 FINAL   Final   Organism ID, Bacteria ESCHERICHIA COLI   Final  CULTURE, BLOOD (ROUTINE X 2)     Status: None   Collection Time    10/25/2013  9:12 PM      Result Value Ref Range Status   Specimen Description BLOOD LEFT ANTECUBITAL   Final   Special Requests BOTTLES DRAWN AEROBIC ONLY 6CC   Final   Culture  Setup Time     Final   Value: 10/24/2013 14:30     Performed at [facility]   Culture     Final   Value: ESCHERICHIA COLI     Note: SUSCEPTIBILITIES PERFORMED ON PREVIOUS CULTURE WITHIN THE LAST 5 DAYS.     Note: Gram Stain Report Called to,Read Back By and Verified With: TANDON R 10/24/13 1034 BY THOMPSON S Performed at [facility]     Performed at [facility]   Report Status 10/26/2013 FINAL   Final  CULTURE, BLOOD (ROUTINE X 2)     Status: None   Collection Time    10/29/2013  9:18 PM      Result Value Ref Range Status   Specimen Description BLOOD LEFT HAND   Final   Special Requests BOTTLES DRAWN AEROBIC AND ANAEROBIC  [facility]   Final   Culture  Setup Time     Final   Value: 10/24/2013 08:05     Performed at [facility]   Culture     Final   Value: ESCHERICHIA COLI     Note: Gram Stain Report Called to,Read Back By and Verified With: RANDOM R 10/24/13 0853 BY HUFFINES S Performed at [facility]     Performed at [facility]   Report Status 10/26/2013 FINAL   Final   Organism ID, Bacteria ESCHERICHIA COLI   Final     Studies: Dg Abd Portable 1v  10/24/2013   CLINICAL DATA:  Dilated common bile duct concerning for choledocholithiasis. Acute onset of nausea prior to admission. Right upper quadrant pain.  EXAM: PORTABLE ABDOMEN - 1 VIEW  COMPARISON:  07/27/2013, CT.  FINDINGS: Multiple surgical clips are demonstrated throughout the abdomen. There is relative paucity of small bowel gas, limiting evaluation. Gas is demonstrated throughout the colon. Stool throughout the descending colon and rectum. Supine evaluation limited for the detection of free intraperitoneal air. Marked degenerative change of the lower lumbar spine.  IMPRESSION: Paucity of small bowel gas. No definite evidence for small bowel obstruction.  Stool throughout the descending colon and rectum.   Electronically Signed   By: Lovey Newcomer M.D.   On: 10/24/2013 15:28    Scheduled Meds: . ampicillin-sulbactam (UNASYN) IV  1.5 g Intravenous Q6H  . aspirin EC  81 mg Oral q morning - 10a  . carvedilol  12.5 mg Oral BID WC  . folic acid  1 mg Oral Daily  . furosemide  40 mg Oral Daily  . insulin aspart  0-5 Units Subcutaneous QHS  . insulin aspart  0-9 Units Subcutaneous TID WC  . insulin glargine  70 Units Subcutaneous QHS  . levothyroxine  25 mcg Oral QAC breakfast  . loratadine  10 mg Oral Daily  . meclizine  25 mg Oral BID  . ondansetron (ZOFRAN) IV  4 mg Intravenous TID WC & HS  . pantoprazole  40 mg Oral Daily  . sodium chloride  3 mL Intravenous Q12H  . sodium chloride  3 mL Intravenous Q12H  . tamsulosin  0.4 mg Oral  Daily   Continuous Infusions: . sodium chloride 75 mL/hr at 10/26/13 0555   Time spent: 79min  [facility] Pager [phone]. If 7PM-7AM, please contact night-coverage at [email or web address], password [facility] 10/26/2013, 3:06 PM  LOS: 3 days

## 2013-10-27 ENCOUNTER — Inpatient Hospital Stay (HOSPITAL_COMMUNITY): Payer: Medicare HMO | Admitting: Anesthesiology

## 2013-10-27 ENCOUNTER — Encounter (HOSPITAL_COMMUNITY): Payer: Medicare HMO | Admitting: Anesthesiology

## 2013-10-27 ENCOUNTER — Inpatient Hospital Stay (HOSPITAL_COMMUNITY): Payer: Medicare HMO

## 2013-10-27 ENCOUNTER — Encounter (HOSPITAL_COMMUNITY): Admission: EM | Disposition: E | Payer: Self-pay | Source: Home / Self Care | Attending: Internal Medicine

## 2013-10-27 ENCOUNTER — Encounter (HOSPITAL_COMMUNITY): Payer: Self-pay

## 2013-10-27 DIAGNOSIS — K8050 Calculus of bile duct without cholangitis or cholecystitis without obstruction: Secondary | ICD-10-CM

## 2013-10-27 DIAGNOSIS — I255 Ischemic cardiomyopathy: Secondary | ICD-10-CM

## 2013-10-27 DIAGNOSIS — J9600 Acute respiratory failure, unspecified whether with hypoxia or hypercapnia: Secondary | ICD-10-CM

## 2013-10-27 DIAGNOSIS — I480 Paroxysmal atrial fibrillation: Secondary | ICD-10-CM

## 2013-10-27 DIAGNOSIS — R739 Hyperglycemia, unspecified: Secondary | ICD-10-CM

## 2013-10-27 HISTORY — PX: ENDOSCOPIC RETROGRADE CHOLANGIOPANCREATOGRAPHY (ERCP) WITH PROPOFOL: SHX5810

## 2013-10-27 LAB — BASIC METABOLIC PANEL
ANION GAP: 12 (ref 5–15)
BUN: 17 mg/dL (ref 6–23)
CALCIUM: 8.4 mg/dL (ref 8.4–10.5)
CO2: 26 mEq/L (ref 19–32)
CREATININE: 0.78 mg/dL (ref 0.50–1.10)
Chloride: 106 mEq/L (ref 96–112)
GFR calc non Af Amer: 76 mL/min — ABNORMAL LOW (ref >90)
GFR, EST AFRICAN AMERICAN: 88 mL/min — AB (ref >90)
Glucose, Bld: 128 mg/dL — ABNORMAL HIGH (ref 70–99)
Potassium: 3.5 mEq/L — ABNORMAL LOW (ref 3.7–5.3)
Sodium: 144 mEq/L (ref 137–147)

## 2013-10-27 LAB — COMPREHENSIVE METABOLIC PANEL
ALT: 51 U/L — ABNORMAL HIGH (ref 0–35)
AST: 21 U/L (ref 0–37)
Albumin: 2.1 g/dL — ABNORMAL LOW (ref 3.5–5.2)
Alkaline Phosphatase: 264 U/L — ABNORMAL HIGH (ref 39–117)
Anion gap: 11 (ref 5–15)
BUN: 14 mg/dL (ref 6–23)
CALCIUM: 8.7 mg/dL (ref 8.4–10.5)
CO2: 27 meq/L (ref 19–32)
CREATININE: 0.79 mg/dL (ref 0.50–1.10)
Chloride: 104 mEq/L (ref 96–112)
GFR, EST AFRICAN AMERICAN: 88 mL/min — AB (ref >90)
GFR, EST NON AFRICAN AMERICAN: 76 mL/min — AB (ref >90)
GLUCOSE: 115 mg/dL — AB (ref 70–99)
Potassium: 3.4 mEq/L — ABNORMAL LOW (ref 3.7–5.3)
Sodium: 142 mEq/L (ref 137–147)
TOTAL PROTEIN: 6.3 g/dL (ref 6.0–8.3)
Total Bilirubin: 1.6 mg/dL — ABNORMAL HIGH (ref 0.3–1.2)

## 2013-10-27 LAB — PROTIME-INR
INR: 1.14 (ref 0.00–1.49)
PROTHROMBIN TIME: 14.6 s (ref 11.6–15.2)

## 2013-10-27 LAB — CBC WITH DIFFERENTIAL/PLATELET
BASOS ABS: 0.1 10*3/uL (ref 0.0–0.1)
Basophils Relative: 1 % (ref 0–1)
EOS PCT: 1 % (ref 0–5)
Eosinophils Absolute: 0.1 10*3/uL (ref 0.0–0.7)
HCT: 36.2 % (ref 36.0–46.0)
Hemoglobin: 11.6 g/dL — ABNORMAL LOW (ref 12.0–15.0)
LYMPHS ABS: 3.3 10*3/uL (ref 0.7–4.0)
LYMPHS PCT: 30 % (ref 12–46)
MCH: 29.9 pg (ref 26.0–34.0)
MCHC: 32 g/dL (ref 30.0–36.0)
MCV: 93.3 fL (ref 78.0–100.0)
MONO ABS: 1.4 10*3/uL — AB (ref 0.1–1.0)
Monocytes Relative: 13 % — ABNORMAL HIGH (ref 3–12)
Neutro Abs: 6.1 10*3/uL (ref 1.7–7.7)
Neutrophils Relative %: 55 % (ref 43–77)
PLATELETS: 212 10*3/uL (ref 150–400)
RBC: 3.88 MIL/uL (ref 3.87–5.11)
RDW: 15.6 % — AB (ref 11.5–15.5)
WBC: 10.9 10*3/uL — ABNORMAL HIGH (ref 4.0–10.5)

## 2013-10-27 LAB — TROPONIN I
Troponin I: <0.3 ng/mL (ref <0.30)
Troponin I: <0.3 ng/mL (ref <0.30)
Troponin I: 0.32 ng/mL (ref <0.30)

## 2013-10-27 LAB — GLUCOSE, CAPILLARY
GLUCOSE-CAPILLARY: 91 mg/dL (ref 70–99)
Glucose-Capillary: 113 mg/dL — ABNORMAL HIGH (ref 70–99)
Glucose-Capillary: 99 mg/dL (ref 70–99)
Glucose-Capillary: 142 mg/dL — ABNORMAL HIGH (ref 70–99)
Glucose-Capillary: 125 mg/dL — ABNORMAL HIGH (ref 70–99)

## 2013-10-27 LAB — MRSA PCR SCREENING: MRSA by PCR: NEGATIVE

## 2013-10-27 LAB — LACTIC ACID, PLASMA: LACTIC ACID, VENOUS: 1.3 mmol/L (ref 0.5–2.2)

## 2013-10-27 SURGERY — ENDOSCOPIC RETROGRADE CHOLANGIOPANCREATOGRAPHY (ERCP) WITH PROPOFOL
Anesthesia: General

## 2013-10-27 MED ORDER — GLYCOPYRROLATE 0.2 MG/ML IJ SOLN
INTRAMUSCULAR | Status: DC | PRN
  Administered 2013-10-27: 0.6 mg via INTRAVENOUS

## 2013-10-27 MED ORDER — MORPHINE SULFATE 2 MG/ML IJ SOLN
2.0000 mg | Freq: Once | INTRAMUSCULAR | Status: AC
  Administered 2013-10-27: 2 mg via INTRAVENOUS

## 2013-10-27 MED ORDER — FENTANYL CITRATE 0.05 MG/ML IJ SOLN
25.0000 ug | INTRAMUSCULAR | Status: DC | PRN
  Administered 2013-10-27: 50 ug via INTRAVENOUS
  Administered 2013-10-27: 100 ug via INTRAVENOUS
  Administered 2013-10-27 (×2): 50 ug via INTRAVENOUS
  Administered 2013-10-28 (×2): 100 ug via INTRAVENOUS
  Filled 2013-10-27 (×6): qty 2

## 2013-10-27 MED ORDER — LIDOCAINE HCL (CARDIAC) 20 MG/ML IV SOLN
INTRAVENOUS | Status: DC | PRN
  Administered 2013-10-27: 80 mg via INTRAVENOUS

## 2013-10-27 MED ORDER — LACTATED RINGERS IV SOLN
INTRAVENOUS | Status: DC | PRN
  Administered 2013-10-27: 10:00:00 via INTRAVENOUS

## 2013-10-27 MED ORDER — METOPROLOL TARTRATE 1 MG/ML IV SOLN
2.5000 mg | INTRAVENOUS | Status: DC | PRN
  Administered 2013-10-27: 2.5 mg via INTRAVENOUS
  Administered 2013-10-28 – 2013-10-29 (×2): 5 mg via INTRAVENOUS
  Filled 2013-10-27 (×5): qty 5

## 2013-10-27 MED ORDER — INSULIN ASPART 100 UNIT/ML ~~LOC~~ SOLN
0.0000 [IU] | SUBCUTANEOUS | Status: DC
  Administered 2013-10-27 – 2013-10-28 (×3): 2 [IU] via SUBCUTANEOUS
  Administered 2013-10-29: 3 [IU] via SUBCUTANEOUS
  Administered 2013-10-29: 2 [IU] via SUBCUTANEOUS
  Administered 2013-10-29 (×2): 3 [IU] via SUBCUTANEOUS
  Administered 2013-10-29 (×2): 2 [IU] via SUBCUTANEOUS
  Administered 2013-10-29: 3 [IU] via SUBCUTANEOUS
  Administered 2013-10-30 (×4): 8 [IU] via SUBCUTANEOUS
  Administered 2013-10-30: 11 [IU] via SUBCUTANEOUS
  Administered 2013-10-31: 8 [IU] via SUBCUTANEOUS

## 2013-10-27 MED ORDER — OXYCODONE HCL 5 MG/5ML PO SOLN: 5.0000 mg | Freq: Once | ORAL | Status: DC | PRN

## 2013-10-27 MED ORDER — POTASSIUM CHLORIDE 20 MEQ/15ML (10%) PO LIQD
40.0000 meq | Freq: Once | ORAL | Status: AC
  Administered 2013-10-27: 40 meq
  Filled 2013-10-27 (×2): qty 30

## 2013-10-27 MED ORDER — SODIUM CHLORIDE 0.9 % IV SOLN
INTRAVENOUS | Status: DC | PRN
  Administered 2013-10-27: 12:00:00

## 2013-10-27 MED ORDER — PROPOFOL 10 MG/ML IV BOLUS
INTRAVENOUS | Status: DC | PRN
  Administered 2013-10-27: 100 mg via INTRAVENOUS

## 2013-10-27 MED ORDER — SODIUM CHLORIDE 0.9 % IV SOLN: INTRAVENOUS | Status: DC

## 2013-10-27 MED ORDER — PANTOPRAZOLE SODIUM 40 MG IV SOLR
40.0000 mg | INTRAVENOUS | Status: DC
  Administered 2013-10-27 – 2013-10-30 (×4): 40 mg via INTRAVENOUS
  Filled 2013-10-27 (×6): qty 40

## 2013-10-27 MED ORDER — NEOSTIGMINE METHYLSULFATE 10 MG/10ML IV SOLN
INTRAVENOUS | Status: DC | PRN
  Administered 2013-10-27: 4 mg via INTRAVENOUS

## 2013-10-27 MED ORDER — MORPHINE SULFATE 4 MG/ML IJ SOLN
4.0000 mg | Freq: Once | INTRAMUSCULAR | Status: AC
  Administered 2013-10-27: 4 mg via INTRAVENOUS
  Filled 2013-10-27: qty 1

## 2013-10-27 MED ORDER — CHLORHEXIDINE GLUCONATE 0.12 % MT SOLN
15.0000 mL | Freq: Two times a day (BID) | OROMUCOSAL | Status: DC
  Administered 2013-10-27 – 2013-10-30 (×6): 15 mL via OROMUCOSAL
  Filled 2013-10-27 (×10): qty 15

## 2013-10-27 MED ORDER — FENTANYL CITRATE 0.05 MG/ML IJ SOLN: 25.0000 ug | INTRAMUSCULAR | Status: DC | PRN

## 2013-10-27 MED ORDER — DIPHENHYDRAMINE HCL 50 MG/ML IJ SOLN
INTRAMUSCULAR | Status: DC | PRN
  Administered 2013-10-27: 12.5 mg via INTRAVENOUS

## 2013-10-27 MED ORDER — PHENYLEPHRINE HCL 10 MG/ML IJ SOLN
30.0000 ug/min | INTRAVENOUS | Status: DC
  Administered 2013-10-27: 10 ug/min via INTRAVENOUS
  Filled 2013-10-27 (×2): qty 1

## 2013-10-27 MED ORDER — PHENYLEPHRINE HCL 10 MG/ML IJ SOLN
INTRAMUSCULAR | Status: DC | PRN
  Administered 2013-10-27 (×2): 120 ug via INTRAVENOUS
  Administered 2013-10-27 (×2): 80 ug via INTRAVENOUS

## 2013-10-27 MED ORDER — SODIUM CHLORIDE 0.9 % IV BOLUS (SEPSIS)
500.0000 mL | Freq: Once | INTRAVENOUS | Status: AC
  Administered 2013-10-27: 500 mL via INTRAVENOUS

## 2013-10-27 MED ORDER — OXYCODONE HCL 5 MG PO TABS: 5.0000 mg | ORAL_TABLET | Freq: Once | ORAL | Status: DC | PRN

## 2013-10-27 MED ORDER — ONDANSETRON HCL 4 MG/2ML IJ SOLN
INTRAMUSCULAR | Status: DC | PRN
  Administered 2013-10-27: 4 mg via INTRAVENOUS

## 2013-10-27 MED ORDER — DILTIAZEM LOAD VIA INFUSION
10.0000 mg | Freq: Once | INTRAVENOUS | Status: DC
  Filled 2013-10-27: qty 10

## 2013-10-27 MED ORDER — SODIUM CHLORIDE 0.45 % IV SOLN
INTRAVENOUS | Status: DC
  Administered 2013-10-27 – 2013-10-31 (×3): via INTRAVENOUS
  Filled 2013-10-27 (×4): qty 1000

## 2013-10-27 MED ORDER — FENTANYL CITRATE 0.05 MG/ML IJ SOLN
INTRAMUSCULAR | Status: DC | PRN
  Administered 2013-10-27: 50 ug via INTRAVENOUS

## 2013-10-27 MED ORDER — ONDANSETRON HCL 4 MG/2ML IJ SOLN: 4.0000 mg | Freq: Four times a day (QID) | INTRAMUSCULAR | Status: DC | PRN

## 2013-10-27 MED ORDER — ROCURONIUM BROMIDE 100 MG/10ML IV SOLN
INTRAVENOUS | Status: DC | PRN
  Administered 2013-10-27: 35 mg via INTRAVENOUS

## 2013-10-27 MED ORDER — MORPHINE SULFATE 2 MG/ML IJ SOLN
INTRAMUSCULAR | Status: AC
  Filled 2013-10-27: qty 1

## 2013-10-27 MED ORDER — DEXTROSE 5 % IV SOLN
5.0000 mg/h | INTRAVENOUS | Status: DC
Start: 2013-10-27 — End: 2013-10-27
  Filled 2013-10-27: qty 100

## 2013-10-27 MED ORDER — MIDAZOLAM HCL 5 MG/5ML IJ SOLN
INTRAMUSCULAR | Status: DC | PRN
  Administered 2013-10-27: 2 mg via INTRAVENOUS

## 2013-10-27 MED ORDER — PHENYLEPHRINE HCL 10 MG/ML IJ SOLN
10.0000 mg | INTRAVENOUS | Status: DC | PRN
  Administered 2013-10-27: 40 ug/min via INTRAVENOUS

## 2013-10-27 MED ORDER — CETYLPYRIDINIUM CHLORIDE 0.05 % MT LIQD
7.0000 mL | Freq: Four times a day (QID) | OROMUCOSAL | Status: DC
  Administered 2013-10-27 – 2013-10-30 (×13): 7 mL via OROMUCOSAL

## 2013-10-27 NOTE — Progress Notes (Signed)
Patient c/o SOB,chest pain 4/10 states "chest feels like full". BP 142/63 HR 87 RR 29 O2 sat 96 on 3L/[place].K. Schorr,NP notified.Order to get EKG.Result relayed.Order obtained to give morphine iv.Rapid response RN notified to come assess patient.MD to come see patient.Will continue to monitor. Jenean Escandon, Drinda Buttsharito Joselita, RCharity fundraiser

## 2013-10-27 NOTE — Progress Notes (Signed)
Pt intubated after procedure. And in ICU. CCM to assume care while in ICU, [facility].  Appreciate consultants.  Crista Curborinna Jaylina Ramdass, MD [facility] [phone]

## 2013-10-27 NOTE — Op Note (Signed)
[facility] [address] [place] [id number]   ERCP PROCEDURE REPORT  PATIENT: Cathy [name], Cathy [name]  MR# :[id number] BIRTHDATE: [birth year]  GENDER: female ENDOSCOPIST: Vida RiggerMarc Ernest Orr, MD REFERRED BY: Willa RoughJeffrey Katz, M.D. PROCEDURE DATE:  10/20/2013 PROCEDURE:   ERCP with sphincterotomy/papillotomy and ERCP with removal of calculus/calculi ASA CLASS:    3 INDICATIONS: recurrent CBD stones MEDICATIONS:    general anesthesia TOPICAL ANESTHETIC: none  DESCRIPTION OF PROCEDURE:   After the risks benefits and alternatives of the procedure were thoroughly explained, informed consent was obtained.  The [id number] ([id number]  endoscope was introduced through the mouth and advanced to the second portion of the duodenum .a normal appearing ampulla was brought into view and the previous sphincterotomy was patent but there seemed to be moderate room to increase it and using the triple-lumen sphincterotome loaded with the JAG Jagwire deep selective cannulation was easily obtained and on the initial cholangiogram the duct was filled with stones and the wire was advanced into the intrahepatics and we proceeded with increasing the sphincterotomy first and then using the 12-15 mm balloon multiple pull through these were done at both sizes and multiple stones debris and sludge was removed we then proceeded with an occlusion cholangiogram and one large stone was still in the duct and the 15 mm balloon would not capture it so we switched to the 15-18 mm balloon and using the 18 mm balloon the stone was easily removed and multiple subsequent 18 millimeter pullthrough were okay and we then proceeded with another occlusion cholangiogram which was okay and the wire was removed and the patient tolerated the procedure well there was no obvious immediate complication and the scope was removed and there was no pancreatic duct injection or wire advancement throughout the procedure and there  was adequate biliary drainage           COMPLICATIONS:none  ENDOSCOPIC IMPRESSION:1 normal ampulla was signs of previous sphincterotomy status post increased sphincterotomy size 2 CBD duct filled with stones sludge and debris removed as above 3 negative occlusion cholangiogram at the end of the procedure with multiple negative balloon pull-throughs and adequate biliary drainage 4. No pancreatic duct injection or wire advancements throughout the procedure  RECOMMENDATIONS:observe for delayed complications if none slowly advance diet and rere consult cardiology and surgery for consideration of cholecystectomy particularly since she did well with anesthesia during my proceedure    _______________________________ eSigned:  Vida RiggerMarc Pandora Mccrackin, MD 11/06/2013 12:16 PM   MV:HQIONGECC:Jeffrey D Myrtis SerKatz, MD  PATIENT NAME:  Cathy [name], Cathy [name] MR#: [id number]

## 2013-10-27 NOTE — Transfer of Care (Signed)
Immediate Anesthesia Transfer of Care Note  Patient: Cathy [name]  Procedure(s) Performed: Procedure(s): Passage of endoscope into duodenum, placement of catheter in bile duct, extraction of stones if present, possible enlarge sphincterotomy (N/A) ENDOSCOPIC RETROGRADE CHOLANGIOPANCREATOGRAPHY (ERCP) WITH PROPOFOL  Patient Location: ICU  Anesthesia Type:General  Level of Consciousness: awake, pateint uncooperative and Patient remains intubated per anesthesia plan  Airway & Oxygen Therapy: Patient remains intubated per anesthesia plan and Patient placed on Ventilator (see vital sign flow sheet for setting)  Post-op Assessment: Report given to PACU RN, Post -op Vital signs reviewed and stable and Patient moving all extremities X 4  Post vital signs: Reviewed and stable  Complications: respiratory complications

## 2013-10-27 NOTE — Anesthesia Preprocedure Evaluation (Signed)
Anesthesia Evaluation  Patient identified by MRN, date of birth, ID band Patient awake    Reviewed: Allergy & Precautions, H&P , NPO status , Patient's Chart, lab work & pertinent test results  Airway Mallampati: II  Neck ROM: full    Dental   Pulmonary shortness of breath,          Cardiovascular hypertension, + CAD, + CABG, + Peripheral Vascular Disease and +CHF  Ascending thoracic aneurysm s/p repair with graft.   Neuro/Psych Depression  Neuromuscular disease    GI/Hepatic   Endo/Other  diabetes, Type 2Hypothyroidism obese  Renal/GU Renal disease     Musculoskeletal  (+) Arthritis -,   Abdominal   Peds  Hematology   Anesthesia Other Findings   Reproductive/Obstetrics                           Anesthesia Physical Anesthesia Plan  ASA: III  Anesthesia Plan: General   Post-op Pain Management:    Induction: Intravenous  Airway Management Planned: Oral ETT  Additional Equipment:   Intra-op Plan:   Post-operative Plan: Extubation in OR  Informed Consent: I have reviewed the patients History and Physical, chart, labs and discussed the procedure including the risks, benefits and alternatives for the proposed anesthesia with the patient or authorized representative who has indicated his/her understanding and acceptance.     Plan Discussed with: CRNA, Anesthesiologist and Surgeon  Anesthesia Plan Comments:         Anesthesia Quick Evaluation

## 2013-10-27 NOTE — Consult Note (Addendum)
PULMONARY / CRITICAL CARE MEDICINE   Name: Cathy [name] MRN: [id number] DOB: 06/17/1930    ADMISSION DATE:  10/24/2013 CONSULTATION DATE:  11/06/2013  REFERRING MD :  Conley Canal   CHIEF COMPLAINT:  Vent management   INITIAL PRESENTATION: 80 yo female with complex PMH including HTN, CAD, CKD (ischemic cardiomyopathy (EF25-30%) PAF and gallstones with mult prior admissions for cholecystits/cholelithiasis admitted 10/4 with intractable n/v and abd pain. Admitted with mult gallstones with dilated CBD as well as NSTEMI and acute on chronic renal failure. Course has been c/b EColi UTI as well as EColi bacteremia.  She was seen by GI and ultimately underwent ERCP 10/8.  She tol the procedure well but had some mild hypotension and required 50% FiO2 and was left intubated post procedure and PCCM consulted for ICU management.    STUDIES:  abd u/s 10/5>>> Cholelithiasis with gallbladder distention, Echogenic foci within a dilated common bile duct most concerning for choledocholithiasis. The common bile duct measures 15.5 mm in diameter. 2D echo 10/5>>> EF 30-35%, mod LVH, severely reduced systolic fx, global hypokinesis, grade 2 diastolic dysfunc, mod MR, PA press 15mmHg  SIGNIFICANT EVENTS: 10/8 ERCP>>>   HISTORY OF PRESENT ILLNESS: 80 yo female with complex PMH including HTN, CAD, CKD (ischemic cardiomyopathy (EF25-30%) AFib and gallstones with mult prior admissions for cholecystits/cholelithiasis admitted 10/4 with intractable n/v and abd pain. Admitted with mult gallstones with dilated CBD as well as NSTEMI and acute on chronic renal failure. Course has been c/b EColi UTI as well as EColi bacteremia.  She was seen by GI and ultimately underwent ERCP 10/8.  She tol the procedure well but had some mild hypotension and required 50% FiO2 and was left intubated post procedure and PCCM consulted for ICU management.   PAST MEDICAL HISTORY :  Past Medical History  Diagnosis Date  . Hypertension      Unspecified  . Hyperlipidemia     Mixed, statin intolerance.  [name] CAD (coronary artery disease)     a. CAD s/p CABG 2003. b. Cath 2006: occluded OM vein graft. c. Anterior MI 04/15/12 s/p PTCA only to ramus (stenting unsuccessful at that time) d. NSTEMI 06/2012: failed med rx, s/p DES to ramus c/b vessel perforation tx with balloon/stenting; e. 02/1013 Cath: LM nl, LAD mod dzs mid, LIMA ok, LCX 114m, OM1 patent stent, VG->OM 100, RCA 100p, VG->PDA ok, EF 40-45%->Med Rx.  . S/P CABG (coronary artery bypass graft) 2003     2003  LIMA, LAD, SVG to the OM, SVG right coronary artery  . Thoracic ascending aortic aneurysm 2003    Ascending thoracic aneurysm repair with a graft at time of CABG  . Diabetes mellitus     Insulin dependent  . Gastroparesis   . Depression   . Allergy history, radiographic dye     Contrast dye allergy  . Previous back surgery   . Syncope 2008    Question?  2008  . Chronic systolic heart failure   . Ischemic cardiomyopathy 06/2011    a. Echo 04/19/12: EF 25-30%, mod LVH, diffuse HK, trivial AI  . Hypotension     October, 2013  . Hypothyroidism     Treated with low-dose Synthroid  . CKD (chronic kidney disease)   . Carotid artery disease     a. 80-99% prox RICA stenosis - with recent cardiac event 06/2012, favor waiting at least 1 month, optimally 3 months post PCI.  [name] Hypertriglyceridemia   . Statin intolerance   . Peripheral neuropathy   .  PAF (paroxysmal atrial fibrillation)     a. Noted during 06/2012 hospitalization. Placed on amiodarone. Plan is for event monitor to assess for breakthrough, hold off anticoag unless additional AF seen.  [name] NSVT (nonsustained ventricular tachycardia)     a. Noted during 06/2012 hospitalization.  . Plavix Non-responder     a. Also allergic to brilinta.     has past surgical history that includes Coronary artery bypass graft (05/05/2001 ); Bladder tacking; Salivary gland surgery; Total abdominal hysterectomy; Back surgery; Breast  surgery; External ear surgery; Cardiac catheterization (04/15/12, 07/05/12); ERCP (N/A, 11/04/2012); Cataract extraction w/PHACO (Right, 05/31/2013); Cataract extraction w/PHACO (Left, 06/14/2013); and Colon resection. Prior to Admission medications   Medication Sig Start Date End Date Taking? Authorizing Provider  acetaminophen (TYLENOL) 500 MG tablet Take 1 tablet (500 mg total) by mouth every 6 (six) hours as needed for mild pain. 07/28/13  Yes Samuella Cota, MD  aspirin EC 81 MG tablet Take 81 mg by mouth every morning. 04/28/12  Yes Eugene C Serpe, PA-C  carvedilol (COREG) 12.5 MG tablet Take 1 tablet (12.5 mg total) by mouth 2 (two) times daily. 07/25/13  Yes Carlena Bjornstad, MD  cetirizine (ZYRTEC) 10 MG tablet Take 10 mg by mouth daily.   Yes Historical Provider, MD  clindamycin (CLEOCIN) 300 MG capsule Take 1 capsule (300 mg total) by mouth 3 (three) times daily. 10/19/13  Yes Alycia Rossetti, MD  Cyanocobalamin (VITAMIN B 12 PO) Take 1 tablet by mouth daily.   Yes Historical Provider, MD  folic acid (FOLVITE) 616 MCG tablet Take 400 mcg by mouth daily.   Yes Historical Provider, MD  furosemide (LASIX) 40 MG tablet Take 40 mg by mouth daily.   Yes Historical Provider, MD  HYDROcodone-acetaminophen (NORCO) 5-325 MG per tablet Take 1 tablet by mouth every 6 (six) hours as needed for moderate pain. 10/19/13  Yes Alycia Rossetti, MD  insulin glargine (LANTUS) 100 UNIT/ML injection Inject 70 Units into the skin at bedtime.  06/06/13  Yes Alycia Rossetti, MD  insulin lispro (HUMALOG) 100 UNIT/ML injection Inject 16-20 Units into the skin 4 (four) times daily as needed for high blood sugar. Sliding scale as before admission to the hospital. 06/08/13  Yes Alycia Rossetti, MD  levothyroxine (SYNTHROID, LEVOTHROID) 25 MCG tablet Take 1 tablet (25 mcg total) by mouth daily before breakfast. 03/21/13  Yes Alycia Rossetti, MD  meclizine (ANTIVERT) 25 MG tablet Take 25 mg by mouth 2 (two) times daily.   Yes  Historical Provider, MD  nitroGLYCERIN (NITROSTAT) 0.4 MG SL tablet Place 1 tablet (0.4 mg total) under the tongue every 5 (five) minutes as needed for chest pain. May repeat times three. 03/21/13  Yes Alycia Rossetti, MD  ondansetron (ZOFRAN-ODT) 4 MG disintegrating tablet Take 4 mg by mouth every 8 (eight) hours as needed for nausea or vomiting.   Yes Historical Provider, MD  tamsulosin (FLOMAX) 0.4 MG CAPS capsule Take 0.4 mg by mouth daily.   Yes Historical Provider, MD   Allergies  Allergen Reactions  . Amiodarone Shortness Of Breath  . Brilinta [Ticagrelor] Shortness Of Breath and Nausea And Vomiting  . Iohexol Anaphylaxis       . Atorvastatin Other (See Comments)    Unknown-patient admitted to hospital after taking  . Citrus Dermatitis    BREAK OUT ON BODY  . Contrast Media [Iodinated Diagnostic Agents] Other (See Comments)    Passed out  . Plavix [Clopidogrel Bisulfate]  Not an allergy - but PRU was drawn 06/2012 indicating Plavix hyporesponsiveness, thus she was changed to Merkel.  . Zolpidem Tartrate Other (See Comments)    Hallucinations  . Milk-Related Compounds Rash    FAMILY HISTORY:  indicated that her mother is deceased. She indicated that her father is deceased.  SOCIAL HISTORY:  reports that she has never smoked. She has never used smokeless tobacco. She reports that she does not drink alcohol or use illicit drugs.  REVIEW OF SYSTEMS:  Unable, pt sedated on vent.   SUBJECTIVE:   VITAL SIGNS: Temp:  [98 F (36.7 C)-99.3 F (37.4 C)] 98.7 F (37.1 C) (10/08 0939) Pulse Rate:  [87-131] 114 (10/08 1333) Resp:  [17-29] 17 (10/08 1445) BP: (99-164)/(43-132) 122/69 mmHg (10/08 1445) SpO2:  [92 %-100 %] 98 % (10/08 1445) FiO2 (%):  [50 %] 50 % (10/08 1333) Weight:  [199 lb 15.3 oz (90.7 kg)-202 lb 9.6 oz (91.9 kg)] 202 lb 9.6 oz (91.9 kg) (10/08 1400) HEMODYNAMICS:   VENTILATOR SETTINGS: Vent Mode:  [-] PRVC FiO2 (%):  [50 %] 50 % Set Rate:  [14 bmp] 14  bmp Vt Set:  [460 mL] 460 mL PEEP:  [5 cmH20] 5 cmH20 Plateau Pressure:  [23 cmH20] 23 cmH20 INTAKE / OUTPUT:  Intake/Output Summary (Last 24 hours) at 10/21/2013 1504 Last data filed at 10/25/2013 1400  Gross per 24 hour  Intake    635 ml  Output   1025 ml  Net   -390 ml    PHYSICAL EXAMINATION: General:  Chronically ill appearing female, NAD on vent post ERCP  Neuro:  Sedated, RASS 0, opens eyes, will follow simple commands, MAE HEENT:  Mm moist, ETT, no JVD Cardiovascular:  s1s2 irreg Lungs:  resps even non labored on vent, few scattered rhonchi, diminished bases Abdomen:  Round, mildly distended, hypoactive bs  Musculoskeletal:  Warm and dry, 1+ BLE edema    LABS:  CBC  Recent Labs Lab 11/14/2013 1733 10/25/13 0619 11/08/2013 0616  WBC 12.6* 12.6* 10.9*  HGB 13.7 12.7 11.6*  HCT 40.9 39.2 36.2  PLT 213 202 212   Coag's  Recent Labs Lab 11/18/2013 0616  INR 1.14   BMET  Recent Labs Lab 11/14/2013 1733 10/25/13 0619 11/05/2013 0616  NA 132* 141 142  K 4.5 3.8 3.4*  CL 91* 102 104  CO2 $Re'27 27 27  'XTo$ BUN $R'20 16 14  'KI$ CREATININE 1.16* 0.90 0.79  GLUCOSE 462* 165* 115*   Electrolytes  Recent Labs Lab 11/08/2013 1733 10/24/13 1430 10/25/13 0619 10/26/2013 0616  CALCIUM 9.4  --  8.7 8.7  MG  --  2.0  --   --    Sepsis Markers  Recent Labs Lab 10/25/2013 1922  LATICACIDVEN 3.7*   ABG No results found for this basename: PHART, PCO2ART, PO2ART,  in the last 168 hours Liver Enzymes  Recent Labs Lab 10/24/13 1430 10/25/13 0619 11/05/2013 0616  AST 186* 46* 21  ALT 211* 114* 51*  ALKPHOS 263* 210* 264*  BILITOT 4.7* 1.8* 1.6*  ALBUMIN 3.0* 2.7* 2.1*   Cardiac Enzymes  Recent Labs Lab 11/15/2013 1757  10/24/13 0719 10/24/13 1223 10/28/2013 0616  TROPONINI 0.41*  < > 0.31* 0.31* <0.30  PROBNP 2918.0*  --   --   --   --   < > = values in this interval not displayed. Glucose  Recent Labs Lab 10/26/13 0737 10/26/13 1211 10/26/13 1707 10/26/13 2105  11/12/2013 0742 11/11/2013 1039  GLUCAP 179* 194* 177*  179* 113* 99    Imaging No results found.   ASSESSMENT / PLAN:  PULMONARY OETT 10/8>>> Acute respiratory failure - multifactorial post procedure with underlying bacteremia, abd distension and AFib with RVR.   P:   Cont vent support  CXR now  HR control  Hold diuresis for now with hypotension  Titrate FiO2 as able to keep sats >92%  SBT in am   CARDIOVASCULAR ?NSTEMI - peak trop 0.41 H/O PAF Development of AFRVR periopratively 10/08 Hypotension  Hx chronic combined CHF Ischemic cardiomyopathy - EF 30-35% Chronic beta blocker therapy P:  Hold lasix for now  Gentle volume with hypotension, RVR F/u trop, lactate, EKG Hold anticoagulation for now - if remains in Afib will need to consider anticoag if no plans for further procedure Hold anti-htn meds Phenylephrine to maintain MAP > 65 mmHg Low dose PRN metoprolol to maintain HR < 115/min Resume coreg when BP improved   RENAL Hypokalemia  P:   Replete K PRN  F/u chem  Maintenance IVFs ordered  GASTROINTESTINAL Choledocholithiasis with cholangitis - s/p ERCP 10/8 Markedly elevated alk phos  Nausea/vomiting  P:   Per GI should consider surgical input for ?cholecystectomy -- ?if she would tolerate this. ?Consider while she is intubated? Ok for clear liquids per GI so will place OG for meds - NPO while on vent  F/u LFT's  PRN antiemetics CCS consult requested Marlou Starks)  HEMATOLOGIC No active issue  P:  F/u cbc   INFECTIOUS EColi UTI  EColi bacteremia likely secondary to cholangitis  P:   BCx2  10/4>>> 1/2 EColi>>> Res cipro UC 10/4>>> EColi>>> Res cipro, levaquin  Unasyn 10/5>>> Cont abx  F/u lactate, pct   ENDOCRINE IDDM   hypothyroid P:   Change SSI to q4  Will hold lantus for now with relative hypoglycemia and NPO Cont synthroid   NEUROLOGIC No active issue P:   RASS goal: -1 Cont sedation while on vent  Daily WUA    Family updated: no  family available    I have personally obtained a history, examined the patient, evaluated laboratory and imaging results, formulated the assessment and plan and placed orders. CRITICAL CARE: The patient is critically ill with multiple organ systems failure and requires high complexity decision making for assessment and support, frequent evaluation and titration of therapies, application of advanced monitoring technologies and extensive interpretation of multiple databases. Critical Care Time devoted to patient care services described in this note is 30 minutes.    Nickolas Madrid, NP 10/30/2013  3:04 PM Pager: [phone] or [phone]  *Care during the described time interval was provided by me and/or other providers on the critical care team. I have reviewed this patient's available data, including medical history, events of note, physical examination and test results as part of my evaluation.   Merton Border, MD ; [facility] [phone].  After 5:30 PM or weekends, call [phone]

## 2013-10-27 NOTE — Progress Notes (Signed)
11/05/2013 01:30 K. Schorr,NP in to see patient,order received to give additional morphine which was given.Patient verbalized feeling much better.Will continue to monitor. Jamonte Curfman, Drinda Buttsharito Joselita, RCharity fundraiser

## 2013-10-27 NOTE — Progress Notes (Signed)
Events:  Notified by RN at approx 0030 that  pt w/ c/o's CP (4/10) and SOB. RR RN paged and has assessed pt at bedside.  Requested 12 lead EKG. Morphine 2 mg IV ordered for pain.  NP to bedside. Subjective:   Pt reports chest "feeling full" all day.  Denies radiation, diaphoresis, nausea, dizziness, or other c/o's.  Pt does, however, report mild SOB. Pt does admit pain is worse in certain positions. Pt reports some relief with IV morphine, but still c/o's of pain. Objective: Ms.  Delton Seeelson is an 80y/o female w/ hx of gallstones with prior admissions for cholecystits/cholelithiasis who was admitted on 10/28/2013 w/ intractable n/v and abd pain. Diagnosed with choledocholithiasis with cholangitis. Hospitalization has been complicated by elevated troponin, DM2, hyponatremia, E coli bacteremia and chronic systolic CHF (stable).  At bedside, pt noted resting with eyes closed in NAD.  She awakens easily and is AA & O x3. Skin w/d, color WNL.  BBS somewhat diminished but otherwise CTA. RR-26, HR 117, irregular.  Remaining VSS.  12 lead EKG reveals Afib with RVR.  Pt has abd TTP to RUQ (which increases CP).  Pt does have significant hx of CAD, MI (CABG 2003), and PAF. Assessment/Plan: 1.  Chest pain- , Given duration of pain (all day) and TTP to RUQ increasing CP, feel most likely related to RUQ abd pain vs ACS.  Will obtain troponin x1.  Will cycle if elevated. 2. Afib with RVR- pt with hx of PAF, last known episode 2014 during hospitalization.  Rate controlled at 90-115.  Will continue to monitor rate.  Will hold on anticoagulation for now, given plan for surgery in am.  3.  RUQ abd pain- related to current diagnosis.  Will repeat IV morphine.   Reassessment; F/u w/ RN at approx 0330.  Pt received Morphine 4 mg IV reported significant relief of pain and is now noted to be sleeping. Will continue to monitor closely on telemetry  Darcus AustinSahar Sloane Junkin, PA-C [facility] [phone]

## 2013-10-27 NOTE — Progress Notes (Signed)
10/30/2013 03:20 Patient sleeping,no distress noted. Evelynne Spiers, Drinda Buttsharito Joselita, RCharity fundraiser

## 2013-10-27 NOTE — Progress Notes (Signed)
Cathy KohlerFreda H [name] 9:59 AM  Subjective: Patient doing surprisingly well known to me from previous ERCP without any new complaint unfortunately was deemed not a surgical candidate for her gallbladder and she seems to be improving from her current issue and her computer chart was reviewed  Objective: Vital signs stable afebrile no acute exam please see pre-assessment evaluation labs and x-rays reviewed  Assessment: Recurrent CBD stones in a patient with multiple gallstones  Plan: Okay to proceed with ERCP today with anesthesia assistance and would ask the surgeons here if a laparoscopic cholecystectomy as an option if cardiology with okay to prevent future issues  [name]

## 2013-10-27 NOTE — Progress Notes (Signed)
CRITICAL VALUE ALERT  Critical value received:  Troponin 0.32  Date of notification:  10/22/2013   Time of notification:  2232  Critical value read back:Yes.    Nurse who received alert:  [name], [name]  MD notified (1st page):  N/A expected value.

## 2013-10-27 NOTE — Consult Note (Signed)
Cathy [name] 07-24-1930  [id number].   Requesting MD: Dr. Merton Border  Chief Complaint/Reason for Consult: cholelithiasis HPI: This is an 80 yo white female with multiple medical problems and multiple cardiac problems who presented to the [name] with abdominal pain.  She was found to have cholangitis secondary to choledocholithiasis.  She has been on abx therapy.  Her [place] does not show evidence of cholecystitis.  She underwent ERCP today with stone removal and was left intubated due to inability to safely extubate.  She is also on 15 of neo as well.  Cardiology evaluated her this admission and also felt she was at moderate risk for significant perioperative cardiac event.  Her EF is 25-30%.  We have been asked to see today for recommendations on proceeding with a cholecystectomy.  All of her history is obtained from her chart.  She is intubated and unable to provide a history.  ROS: Unable to obtain as patient is intubated  Family History  Problem Relation Age of Onset  . Heart disease Other   . Heart disease Mother   . Hypertension Mother   . Heart disease Father   . Hypertension Father     Past Medical History  Diagnosis Date  . Hypertension     Unspecified  . Hyperlipidemia     Mixed, statin intolerance.  [name] CAD (coronary artery disease)     a. CAD s/p CABG 2003. b. Cath 2006: occluded OM vein graft. c. Anterior MI 04/15/12 s/p PTCA only to ramus (stenting unsuccessful at that time) d. NSTEMI 06/2012: failed med rx, s/p DES to ramus c/b vessel perforation tx with balloon/stenting; e. 02/1013 Cath: LM nl, LAD mod dzs mid, LIMA ok, LCX 156m, OM1 patent stent, VG->OM 100, RCA 100p, VG->PDA ok, EF 40-45%->Med Rx.  . S/P CABG (coronary artery bypass graft) 2003     2003  LIMA, LAD, SVG to the OM, SVG right coronary artery  . Thoracic ascending aortic aneurysm 2003    Ascending thoracic aneurysm repair with a graft at time of CABG  . Diabetes mellitus     Insulin dependent  . Gastroparesis    . Depression   . Allergy history, radiographic dye     Contrast dye allergy  . Previous back surgery   . Syncope 2008    Question?  2008  . Chronic systolic heart failure   . Ischemic cardiomyopathy 06/2011    a. Echo 04/19/12: EF 25-30%, mod LVH, diffuse HK, trivial AI  . Hypotension     October, 2013  . Hypothyroidism     Treated with low-dose Synthroid  . CKD (chronic kidney disease)   . Carotid artery disease     a. 80-99% prox RICA stenosis - with recent cardiac event 06/2012, favor waiting at least 1 month, optimally 3 months post PCI.  [name] Hypertriglyceridemia   . Statin intolerance   . Peripheral neuropathy   . PAF (paroxysmal atrial fibrillation)     a. Noted during 06/2012 hospitalization. Placed on amiodarone. Plan is for event monitor to assess for breakthrough, hold off anticoag unless additional AF seen.  [name] NSVT (nonsustained ventricular tachycardia)     a. Noted during 06/2012 hospitalization.  . Plavix Non-responder     a. Also allergic to brilinta.    Past Surgical History  Procedure Laterality Date  . Coronary artery bypass graft  05/05/2001     LIMA, LAD, SVG to the OM, SVG right coronary artery  . Bladder tacking    .  Salivary gland surgery    . Total abdominal hysterectomy    . Back surgery      X2   . Breast surgery    . External ear surgery      X2   . Cardiac catheterization  04/15/12, 07/05/12    60-70% proximal LAD, 95% mid AV groove circumflex s/p PTCA, proximal RCA occlusion, patent LIMA to LAD, patent SVG to distal RCA, chronically occluded SVG to circumflex; LVEF 25-30%  . Ercp N/A 11/04/2012    Dr. Watt Climes: bulbous ampulla, multiple CBD stones and debris status post removal/extraction, sphincterotomy  . Cataract extraction w/phaco Right 05/31/2013    Procedure: CATARACT EXTRACTION PHACO AND INTRAOCULAR LENS PLACEMENT (IOC);  Surgeon: Elta Guadeloupe T. Gershon Crane, MD;  Location: [facility];  Service: Ophthalmology;  Laterality: Right;  CDE: 26.18   . Cataract  extraction w/phaco Left 06/14/2013    Procedure: CATARACT EXTRACTION PHACO AND INTRAOCULAR LENS PLACEMENT (IOC);  Surgeon: Elta Guadeloupe T. Gershon Crane, MD;  Location: [facility];  Service: Ophthalmology;  Laterality: Left;  CDE:11.35  . Colon resection      remote past per patient    Social History:  reports that she has never smoked. She has never used smokeless tobacco. She reports that she does not drink alcohol or use illicit drugs.  Allergies:  Allergies  Allergen Reactions  . Amiodarone Shortness Of Breath  . Brilinta [Ticagrelor] Shortness Of Breath and Nausea And Vomiting  . Iohexol Anaphylaxis       . Atorvastatin Other (See Comments)    Unknown-patient admitted to hospital after taking  . Citrus Dermatitis    BREAK OUT ON BODY  . Contrast Media [Iodinated Diagnostic Agents] Other (See Comments)    Passed out  . Plavix [Clopidogrel Bisulfate]     Not an allergy - but PRU was drawn 06/2012 indicating Plavix hyporesponsiveness, thus she was changed to [place].  . Zolpidem Tartrate Other (See Comments)    Hallucinations  . Milk-Related Compounds Rash    Medications Prior to Admission  Medication Sig Dispense Refill  . acetaminophen (TYLENOL) 500 MG tablet Take 1 tablet (500 mg total) by mouth every 6 (six) hours as needed for mild pain.      [name] aspirin EC 81 MG tablet Take 81 mg by mouth every morning.      . carvedilol (COREG) 12.5 MG tablet Take 1 tablet (12.5 mg total) by mouth 2 (two) times daily.  180 tablet  3  . cetirizine (ZYRTEC) 10 MG tablet Take 10 mg by mouth daily.      . clindamycin (CLEOCIN) 300 MG capsule Take 1 capsule (300 mg total) by mouth 3 (three) times daily.  30 capsule  0  . Cyanocobalamin (VITAMIN B 12 PO) Take 1 tablet by mouth daily.      . folic acid (FOLVITE) 734 MCG tablet Take 400 mcg by mouth daily.      . furosemide (LASIX) 40 MG tablet Take 40 mg by mouth daily.      [name] HYDROcodone-acetaminophen (NORCO) 5-325 MG per tablet Take 1 tablet by mouth every 6  (six) hours as needed for moderate pain.  45 tablet  0  . insulin glargine (LANTUS) 100 UNIT/ML injection Inject 70 Units into the skin at bedtime.       . insulin lispro (HUMALOG) 100 UNIT/ML injection Inject 16-20 Units into the skin 4 (four) times daily as needed for high blood sugar. Sliding scale as before admission to the hospital.      .  levothyroxine (SYNTHROID, LEVOTHROID) 25 MCG tablet Take 1 tablet (25 mcg total) by mouth daily before breakfast.  30 tablet  3  . meclizine (ANTIVERT) 25 MG tablet Take 25 mg by mouth 2 (two) times daily.      . nitroGLYCERIN (NITROSTAT) 0.4 MG SL tablet Place 1 tablet (0.4 mg total) under the tongue every 5 (five) minutes as needed for chest pain. May repeat times three.  30 tablet  3  . ondansetron (ZOFRAN-ODT) 4 MG disintegrating tablet Take 4 mg by mouth every 8 (eight) hours as needed for nausea or vomiting.      . tamsulosin (FLOMAX) 0.4 MG CAPS capsule Take 0.4 mg by mouth daily.        Blood pressure 122/69, pulse 114, temperature 98.7 F (37.1 C), temperature source Oral, resp. rate 17, height $RemoveBe'5\' 5"'weOtDMaWT$  (1.651 m), weight 202 lb 9.6 oz (91.9 kg), SpO2 98.00%. Physical Exam: General: obese, white female who is laying in bed in NAD HEENT: head is normocephalic, atraumatic.  Sclera are noninjected.  PERRL.  Ears and nose without any masses or lesions.  ETT present.  Patient wanting it out. Heart: regular rhythm, but tachy.  Normal s1,s2. No obvious murmurs, gallops, or rubs noted.  Palpable radial and pedal pulses bilaterally Lungs: CTAB, no wheezes, rhonchi, or rales noted.  Respiratory effort nonlabored Abd: soft, NT, ND, +BS, no masses, hernias, or organomegaly, midline scar MS: all 4 extremities are symmetrical with no cyanosis, clubbing. Some bilateral lower extremity edema Skin: warm and dry with no masses, lesions, or rashes Psych: Alert on vent.  Psych unable to be done at this time given intubation.    Results for orders placed during the  hospital encounter of 10/21/2013 (from the past 48 hour(s))  GLUCOSE, CAPILLARY     Status: Abnormal   Collection Time    10/25/13  4:58 PM      Result Value Ref Range   Glucose-Capillary 253 (*) 70 - 99 mg/dL   Comment 1 Documented in Chart     Comment 2 Notify RN    GLUCOSE, CAPILLARY     Status: Abnormal   Collection Time    10/25/13  8:03 PM      Result Value Ref Range   Glucose-Capillary 253 (*) 70 - 99 mg/dL  GLUCOSE, CAPILLARY     Status: Abnormal   Collection Time    10/26/13  7:37 AM      Result Value Ref Range   Glucose-Capillary 179 (*) 70 - 99 mg/dL  GLUCOSE, CAPILLARY     Status: Abnormal   Collection Time    10/26/13 12:11 PM      Result Value Ref Range   Glucose-Capillary 194 (*) 70 - 99 mg/dL  GLUCOSE, CAPILLARY     Status: Abnormal   Collection Time    10/26/13  5:07 PM      Result Value Ref Range   Glucose-Capillary 177 (*) 70 - 99 mg/dL  GLUCOSE, CAPILLARY     Status: Abnormal   Collection Time    10/26/13  9:05 PM      Result Value Ref Range   Glucose-Capillary 179 (*) 70 - 99 mg/dL  CBC WITH DIFFERENTIAL     Status: Abnormal   Collection Time    10/30/2013  6:16 AM      Result Value Ref Range   WBC 10.9 (*) 4.0 - 10.5 K/uL   RBC 3.88  3.87 - 5.11 MIL/uL   Hemoglobin 11.6 (*) 12.0 -  15.0 g/dL   HCT 36.2  36.0 - 46.0 %   MCV 93.3  78.0 - 100.0 fL   MCH 29.9  26.0 - 34.0 pg   MCHC 32.0  30.0 - 36.0 g/dL   RDW 15.6 (*) 11.5 - 15.5 %   Platelets 212  150 - 400 K/uL   Neutrophils Relative % 55  43 - 77 %   Neutro Abs 6.1  1.7 - 7.7 K/uL   Lymphocytes Relative 30  12 - 46 %   Lymphs Abs 3.3  0.7 - 4.0 K/uL   Monocytes Relative 13 (*) 3 - 12 %   Monocytes Absolute 1.4 (*) 0.1 - 1.0 K/uL   Eosinophils Relative 1  0 - 5 %   Eosinophils Absolute 0.1  0.0 - 0.7 K/uL   Basophils Relative 1  0 - 1 %   Basophils Absolute 0.1  0.0 - 0.1 K/uL  COMPREHENSIVE METABOLIC PANEL     Status: Abnormal   Collection Time    11/16/2013  6:16 AM      Result Value Ref  Range   Sodium 142  137 - 147 mEq/L   Potassium 3.4 (*) 3.7 - 5.3 mEq/L   Chloride 104  96 - 112 mEq/L   CO2 27  19 - 32 mEq/L   Glucose, Bld 115 (*) 70 - 99 mg/dL   BUN 14  6 - 23 mg/dL   Creatinine, Ser 0.79  0.50 - 1.10 mg/dL   Calcium 8.7  8.4 - 10.5 mg/dL   Total Protein 6.3  6.0 - 8.3 g/dL   Albumin 2.1 (*) 3.5 - 5.2 g/dL   AST 21  0 - 37 U/L   ALT 51 (*) 0 - 35 U/L   Alkaline Phosphatase 264 (*) 39 - 117 U/L   Total Bilirubin 1.6 (*) 0.3 - 1.2 mg/dL   GFR calc non Af Amer 76 (*) >90 mL/min   GFR calc Af Amer 88 (*) >90 mL/min   Comment: (NOTE)     The eGFR has been calculated using the CKD EPI equation.     This calculation has not been validated in all clinical situations.     eGFR's persistently <90 mL/min signify possible Chronic Kidney     Disease.   Anion gap 11  5 - 15  PROTIME-INR     Status: None   Collection Time    11/13/2013  6:16 AM      Result Value Ref Range   Prothrombin Time 14.6  11.6 - 15.2 seconds   INR 1.14  0.00 - 1.49  TROPONIN I     Status: None   Collection Time    10/25/2013  6:16 AM      Result Value Ref Range   Troponin I <0.30  <0.30 ng/mL   Comment:            Due to the release kinetics of cTnI,     a negative result within the first hours     of the onset of symptoms does not rule out     myocardial infarction with certainty.     If myocardial infarction is still suspected,     repeat the test at appropriate intervals.  GLUCOSE, CAPILLARY     Status: Abnormal   Collection Time    11/12/2013  7:42 AM      Result Value Ref Range   Glucose-Capillary 113 (*) 70 - 99 mg/dL  GLUCOSE, CAPILLARY     Status:  None   Collection Time    11/15/2013 10:39 AM      Result Value Ref Range   Glucose-Capillary 99  70 - 99 mg/dL   Comment 1 Documented in Chart    MRSA PCR SCREENING     Status: None   Collection Time    11/14/2013  1:44 PM      Result Value Ref Range   MRSA by PCR NEGATIVE  NEGATIVE   Comment:            The GeneXpert MRSA Assay (FDA      approved for NASAL specimens     only), is one component of a     comprehensive MRSA colonization     surveillance program. It is not     intended to diagnose MRSA     infection nor to guide or     monitor treatment for     MRSA infections.  GLUCOSE, CAPILLARY     Status: Abnormal   Collection Time    11/05/2013  3:38 PM      Result Value Ref Range   Glucose-Capillary 142 (*) 70 - 99 mg/dL   Dg Chest Portable 1 View  11/03/2013   CLINICAL DATA:  Respiratory failure, hypertension, diabetes, coronary artery disease, ascending aortic aneurysm  EXAM: PORTABLE CHEST - 1 VIEW  COMPARISON:  11/12/2013  FINDINGS: Endotracheal tube with the tip 6 cm above the carina. Bilateral interstitial and alveolar airspace opacities. Bilateral layering pleural effusions. No pneumothorax. Enlarged central pulmonary vasculature. Stable cardiomegaly. Prior CABG.  IMPRESSION: 1. Overall findings most concerning for pulmonary edema. 2. Endotracheal tube with the tip 6 cm above the carina.   Electronically Signed   By: Kathreen Devoid   On: 11/01/2013 15:49   Dg Ercp Biliary & Pancreatic Ducts  10/29/2013   CLINICAL DATA:  Bile duct obstruction, and cholecystitis and choledocholithiasis  EXAM: ERCP  TECHNIQUE: Multiple spot images obtained with the fluoroscopic device and submitted for interpretation post-procedure.  COMPARISON:  Ultrasound 10/24/2013 and earlier studies  FINDINGS: A series of 5 fluoroscopic spot images document endoscopic cannulation and opacification of the common bile duct. CBD is mildly distended. Variable filling defects are identified in the mid and distal CBD. Incomplete opacification of intrahepatic bile ducts, which appear decompressed centrally. The final image demonstrates decompression of the common duct with no definite residual filling defects.  IMPRESSION: 1. Choledocholithiasis with endoscopic decompression.  These images were submitted for radiologic interpretation only. Please see the  procedural report for the amount of contrast and the fluoroscopy time utilized.   Electronically Signed   By: Arne Cleveland M.D.   On: 10/26/2013 14:06       Assessment/Plan 1. Cholangitis secondary to choledocholithiasis Patient Active Problem List   Diagnosis Date Noted  . E coli bacteremia 10/26/2013  . Osteoarthritis, shoulder 10/26/2013  . Cholangitis 10/26/2013  . Nausea with vomiting 10/24/2013  . Elevated troponin 10/24/2013  . Hyperglycemia 11/12/2013  . Infected blister of left leg 08/17/2013  . Choledocholithiasis 07/27/2013  . Hypotension 07/25/2013  . Rash 07/25/2013  . Hypertriglyceridemia 03/14/2013  . Chest pain 03/12/2013  . Urinary incontinence 11/10/2012  . Common bile duct (CBD) obstruction 11/06/2012  . Fatigue 11/06/2012  . Cholelithiasis 11/02/2012  . UTI (urinary tract infection) 11/02/2012  . H/O amiodarone therapy 11/02/2012  . Atrial fibrillation   . NSVT (nonsustained ventricular tachycardia)   . Chronic systolic CHF (congestive heart failure) 05/23/2012  . CKD (chronic kidney disease)   .  Carotid artery disease   . Right carotid bruit 04/28/2012  . Hypothyroidism   . Neck pain 10/24/2011  . DM (diabetes mellitus), type 2 with neurological complications 22/29/7989  . Back pain 08/21/2011  . Diabetic neuropathy 08/21/2011  . Ischemic cardiomyopathy 06/21/2011  . Shortness of breath   . Hypertension   . Hyperlipidemia   . S/P CABG (coronary artery bypass graft)   . Aneurysm   . Depression   . Allergy history, radiographic dye   . Syncope   . Statin intolerance   . CAD (coronary artery disease) 08/21/2009   Plan: 1. The patient is currently nontender.  Her infection appears to be secondary to cholangitis and not cholecystitis.  Given she has undergone ERCP for this, that will correct her cholangitis.  Cardiology has stated that the patient is at moderate risk for perioperative cardiac complication.  Given she required continued intubation  just after ERCP, the patient would likely have more problems after a cholecystectomy.  We would recommend, since she does not have cholecystitis, for her to improve from this incident and follow up with [place] as an outpatient when she is better to discuss elective cholecystectomy.  She will obviously need to follow up with cardiology as well for further clearance.  No surgical plans this admission.  We will sign off and have her follow up as an outpatient.  Please call if anything changes.  Jacquelene Kopecky E 11/01/2013, 4:05 PM Pager: [phone]

## 2013-10-28 ENCOUNTER — Encounter (HOSPITAL_COMMUNITY): Payer: Self-pay | Admitting: Gastroenterology

## 2013-10-28 ENCOUNTER — Inpatient Hospital Stay (HOSPITAL_COMMUNITY): Payer: Medicare HMO

## 2013-10-28 DIAGNOSIS — J9601 Acute respiratory failure with hypoxia: Secondary | ICD-10-CM

## 2013-10-28 DIAGNOSIS — K830 Cholangitis: Secondary | ICD-10-CM

## 2013-10-28 LAB — POCT I-STAT 3, ART BLOOD GAS (G3+)
ACID-BASE EXCESS: 1 mmol/L (ref 0.0–2.0)
Bicarbonate: 27.6 mEq/L — ABNORMAL HIGH (ref 20.0–24.0)
O2 SAT: 94 %
PO2 ART: 76 mmHg — AB (ref 80.0–100.0)
TCO2: 29 mmol/L (ref 0–100)
pCO2 arterial: 51.9 mmHg — ABNORMAL HIGH (ref 35.0–45.0)
pH, Arterial: 7.331 — ABNORMAL LOW (ref 7.350–7.450)

## 2013-10-28 LAB — GLUCOSE, CAPILLARY
Glucose-Capillary: 76 mg/dL (ref 70–99)
Glucose-Capillary: 72 mg/dL (ref 70–99)
Glucose-Capillary: 73 mg/dL (ref 70–99)
Glucose-Capillary: 109 mg/dL — ABNORMAL HIGH (ref 70–99)
Glucose-Capillary: 149 mg/dL — ABNORMAL HIGH (ref 70–99)
Glucose-Capillary: 146 mg/dL — ABNORMAL HIGH (ref 70–99)

## 2013-10-28 LAB — CBC
HEMATOCRIT: 36.3 % (ref 36.0–46.0)
HEMOGLOBIN: 11.4 g/dL — AB (ref 12.0–15.0)
MCH: 29.8 pg (ref 26.0–34.0)
MCHC: 31.4 g/dL (ref 30.0–36.0)
MCV: 95 fL (ref 78.0–100.0)
Platelets: 258 10*3/uL (ref 150–400)
RBC: 3.82 MIL/uL — AB (ref 3.87–5.11)
RDW: 16.1 % — AB (ref 11.5–15.5)
WBC: 8.8 10*3/uL (ref 4.0–10.5)

## 2013-10-28 LAB — COMPREHENSIVE METABOLIC PANEL
ALT: 77 U/L — ABNORMAL HIGH (ref 0–35)
ANION GAP: 17 — AB (ref 5–15)
AST: 150 U/L — ABNORMAL HIGH (ref 0–37)
Albumin: 2.1 g/dL — ABNORMAL LOW (ref 3.5–5.2)
Alkaline Phosphatase: 498 U/L — ABNORMAL HIGH (ref 39–117)
BUN: 17 mg/dL (ref 6–23)
CHLORIDE: 105 meq/L (ref 96–112)
CO2: 23 meq/L (ref 19–32)
Calcium: 8.7 mg/dL (ref 8.4–10.5)
Creatinine, Ser: 0.79 mg/dL (ref 0.50–1.10)
GFR calc Af Amer: 88 mL/min — ABNORMAL LOW (ref >90)
GFR calc non Af Amer: 76 mL/min — ABNORMAL LOW (ref >90)
GLUCOSE: 75 mg/dL (ref 70–99)
POTASSIUM: 3.7 meq/L (ref 3.7–5.3)
SODIUM: 145 meq/L (ref 137–147)
Total Bilirubin: 4.7 mg/dL — ABNORMAL HIGH (ref 0.3–1.2)
Total Protein: 6.2 g/dL (ref 6.0–8.3)

## 2013-10-28 LAB — LIPASE, BLOOD: LIPASE: 11 U/L (ref 11–59)

## 2013-10-28 LAB — BLOOD GAS, ARTERIAL
ACID-BASE DEFICIT: 0.4 mmol/L (ref 0.0–2.0)
Bicarbonate: 24.4 mEq/L — ABNORMAL HIGH (ref 20.0–24.0)
Drawn by: 39899
O2 Content: 4 L/min
O2 SAT: 93.6 %
PATIENT TEMPERATURE: 98.6
TCO2: 25.8 mmol/L (ref 0–100)
pCO2 arterial: 45.4 mmHg — ABNORMAL HIGH (ref 35.0–45.0)
pH, Arterial: 7.351 (ref 7.350–7.450)
pO2, Arterial: 72 mmHg — ABNORMAL LOW (ref 80.0–100.0)

## 2013-10-28 LAB — PRO B NATRIURETIC PEPTIDE: Pro B Natriuretic peptide (BNP): 4804 pg/mL — ABNORMAL HIGH (ref 0–450)

## 2013-10-28 LAB — TROPONIN I: Troponin I: 0.35 ng/mL (ref <0.30)

## 2013-10-28 MED ORDER — LABETALOL HCL 5 MG/ML IV SOLN
10.0000 mg | INTRAVENOUS | Status: DC | PRN
  Administered 2013-10-28 (×2): 10 mg via INTRAVENOUS
  Filled 2013-10-28 (×5): qty 4

## 2013-10-28 MED ORDER — POTASSIUM CHLORIDE 10 MEQ/100ML IV SOLN
10.0000 meq | INTRAVENOUS | Status: AC
  Administered 2013-10-28 (×2): 10 meq via INTRAVENOUS
  Filled 2013-10-28 (×2): qty 100

## 2013-10-28 MED ORDER — HEPARIN SODIUM (PORCINE) 5000 UNIT/ML IJ SOLN
5000.0000 [IU] | Freq: Three times a day (TID) | INTRAMUSCULAR | Status: DC
  Administered 2013-10-28 – 2013-10-30 (×8): 5000 [IU] via SUBCUTANEOUS
  Filled 2013-10-28 (×11): qty 1

## 2013-10-28 MED ORDER — ASPIRIN EC 81 MG PO TBEC
81.0000 mg | DELAYED_RELEASE_TABLET | Freq: Every day | ORAL | Status: DC
  Administered 2013-10-29 – 2013-10-30 (×2): 81 mg via ORAL
  Filled 2013-10-28 (×3): qty 1

## 2013-10-28 MED ORDER — FUROSEMIDE 10 MG/ML IJ SOLN
INTRAMUSCULAR | Status: AC
  Filled 2013-10-28: qty 4

## 2013-10-28 MED ORDER — LABETALOL HCL 5 MG/ML IV SOLN
10.0000 mg | Freq: Once | INTRAVENOUS | Status: AC
  Administered 2013-10-28: 10 mg via INTRAVENOUS

## 2013-10-28 MED ORDER — FUROSEMIDE 10 MG/ML IJ SOLN
40.0000 mg | Freq: Once | INTRAMUSCULAR | Status: AC
  Administered 2013-10-28: 40 mg via INTRAVENOUS

## 2013-10-28 MED ORDER — FUROSEMIDE 10 MG/ML IJ SOLN
20.0000 mg | Freq: Two times a day (BID) | INTRAMUSCULAR | Status: AC
  Administered 2013-10-28 – 2013-10-29 (×4): 20 mg via INTRAVENOUS
  Filled 2013-10-28 (×4): qty 2

## 2013-10-28 MED ORDER — NALOXONE HCL 0.4 MG/ML IJ SOLN
0.4000 mg | Freq: Once | INTRAMUSCULAR | Status: AC
  Administered 2013-10-28: 0.4 mg via INTRAVENOUS

## 2013-10-28 MED ORDER — NALOXONE HCL 0.4 MG/ML IJ SOLN: 0.4000 mg | INTRAMUSCULAR | Status: DC | PRN

## 2013-10-28 MED ORDER — NALOXONE HCL 0.4 MG/ML IJ SOLN
INTRAMUSCULAR | Status: AC
  Filled 2013-10-28: qty 1

## 2013-10-28 MED ORDER — PROMETHAZINE HCL 25 MG/ML IJ SOLN: 6.2500 mg | Freq: Four times a day (QID) | INTRAMUSCULAR | Status: DC | PRN

## 2013-10-28 NOTE — Progress Notes (Signed)
eLink Physician-Brief Progress Note Patient Name: Cathy KohlerFreda H Stradley DOB: [birth year] MRN: [id number]   Date of Service  10/28/2013  HPI/Events of Note  CT of the head suspicious for acute or subacute infarct.  eICU Interventions  MRI ordered.        Tiasha Helvie 10/28/2013, 8:35 PM

## 2013-10-28 NOTE — Clinical Documentation Improvement (Signed)
Presents with acute cholecystitis with calculi in bile ducts. ERCP with sphincterotomy/papillotomy with removal of stones performed.   "Pt intubated after procedure. And in ICU" documented in 10/8 progress note.  Extubation procedure note on 10/28/13  Please provide a diagnosis associated with intubation and document findings in next progress note and discharge summary.  Thank You, Shellee MiloEileen T Lejon Afzal ,RN Clinical Documentation Specialist:  [phone]  [facility]- [facility] Information Management

## 2013-10-28 NOTE — Progress Notes (Signed)
Spoke with Dr. Molli KnockYacoub.  Pt is slightly more awake after narcan, however pt is still intermittently confused and does not follow commands consistently.  Will sometimes squeeze right hand on command, but is more consistent in following commands on left. Orders received for ABG and CT head. Will continue to monitor.

## 2013-10-28 NOTE — Progress Notes (Signed)
Spoke with Dr Tyson AliasFeinstein concerning pt has been very lethargic, slow to respond and intermittently confused.  Orders received. Will continue to monitor.

## 2013-10-28 NOTE — Progress Notes (Addendum)
Cathy KohlerFreda H [name] 8:56 AM  Subjective: Patient accepted doing well post procedure although she doesn't like the breathing tube hopefully she'll be extubated this a.m. and she says her abdomen is better  Objective: Vital signs stable afebrile abdomen is soft nontender rare bowel sounds liver tests slight increase probably due to the edema from multiple stones removed and balloon pull-throughs  Assessment: Multiple medical problems status post ERCP and multiple stones removed  but no signs cholangitis or obvious infection seen   Plan: Okay to slowly advance diet when extubated and repeat liver tests tomorrow and will ask my partner Dr. Madilyn FiremanHayes to check on this weekend  [facility],Cathy [name]

## 2013-10-28 NOTE — Consult Note (Signed)
Referring Physician: Dr. Lake Bells    Chief Complaint: altered mental status, stroke on MRI  HPI:                                                                                                                                         Cathy [name] is an 80 y.o. female with complex PMH including HTN, CAD, CKD (ischemic cardiomyopathy (EF25-30%) PAF, severe right ICA stenosis, and gallstones with mult prior admissions for cholecystits/cholelithiasis. She was extubated this morning but currently confused and " feeling not well" and can not contribute to her clinical information. Hence, all history is obtained from her chart. Patient " admitted 10/4 with intractable n/v and abd pain. Admitted with mult gallstones with dilated CBD as well as NSTEMI and acute on chronic renal failure. Course has been c/b EColi UTI as well as EColi bacteremia. She was seen by GI and ultimately underwent ERCP 10/8. She tol the procedure well but had some mild hypotension and required 50% FiO2 and was left intubated post procedure". As per nursing staff, around noon time today she was noted to be " very lethargic, slow to respond and intermittently confused". Received narcan without improvement and therefore a brain MRI was requested which showed findings consistent with an acute infarct involving the left PCA territory, primarily affecting the mesial left temporal lobe, but also involving the left thalamus and left middle temporal gyrus. MRA brain demonstrates left PCA occlusion in the P1 segment. At this moment, patient is alert, awake, follows commands but seems to be confused. Said that she doesn't feel well but denies HA, vertigo, double vision, changes in language or speech. Complains of feeling weak all over. Date last known well: 10/28/13 Time last known well: noon time tPA Given: no, out of the window   Past Medical History  Diagnosis Date  . Hypertension     Unspecified  . Hyperlipidemia     Mixed, statin  intolerance.  [name] CAD (coronary artery disease)     a. CAD s/p CABG 2003. b. Cath 2006: occluded OM vein graft. c. Anterior MI 04/15/12 s/p PTCA only to ramus (stenting unsuccessful at that time) d. NSTEMI 06/2012: failed med rx, s/p DES to ramus c/b vessel perforation tx with balloon/stenting; e. 02/1013 Cath: LM nl, LAD mod dzs mid, LIMA ok, LCX 129m, OM1 patent stent, VG->OM 100, RCA 100p, VG->PDA ok, EF 40-45%->Med Rx.  . S/P CABG (coronary artery bypass graft) 2003     2003  LIMA, LAD, SVG to the OM, SVG right coronary artery  . Thoracic ascending aortic aneurysm 2003    Ascending thoracic aneurysm repair with a graft at time of CABG  . Diabetes mellitus     Insulin dependent  . Gastroparesis   . Depression   . Allergy history, radiographic dye     Contrast dye allergy  . Previous back surgery   . Syncope  2008    Question?  2008  . Chronic systolic heart failure   . Ischemic cardiomyopathy 06/2011    a. Echo 04/19/12: EF 25-30%, mod LVH, diffuse HK, trivial AI  . Hypotension     October, 2013  . Hypothyroidism     Treated with low-dose Synthroid  . CKD (chronic kidney disease)   . Carotid artery disease     a. 80-99% prox RICA stenosis - with recent cardiac event 06/2012, favor waiting at least 1 month, optimally 3 months post PCI.  [name] Hypertriglyceridemia   . Statin intolerance   . Peripheral neuropathy   . PAF (paroxysmal atrial fibrillation)     a. Noted during 06/2012 hospitalization. Placed on amiodarone. Plan is for event monitor to assess for breakthrough, hold off anticoag unless additional AF seen.  [name] NSVT (nonsustained ventricular tachycardia)     a. Noted during 06/2012 hospitalization.  . Plavix Non-responder     a. Also allergic to brilinta.    Past Surgical History  Procedure Laterality Date  . Coronary artery bypass graft  05/05/2001     LIMA, LAD, SVG to the OM, SVG right coronary artery  . Bladder tacking    . Salivary gland surgery    . Total abdominal  hysterectomy    . Back surgery      X2   . Breast surgery    . External ear surgery      X2   . Cardiac catheterization  04/15/12, 07/05/12    60-70% proximal LAD, 95% mid AV groove circumflex s/p PTCA, proximal RCA occlusion, patent LIMA to LAD, patent SVG to distal RCA, chronically occluded SVG to circumflex; LVEF 25-30%  . Ercp N/A 11/04/2012    Dr. Ewing Schlein: bulbous ampulla, multiple CBD stones and debris status post removal/extraction, sphincterotomy  . Cataract extraction w/phaco Right 05/31/2013    Procedure: CATARACT EXTRACTION PHACO AND INTRAOCULAR LENS PLACEMENT (IOC);  Surgeon: Loraine Leriche T. Nile Riggs, MD;  Location: [facility];  Service: Ophthalmology;  Laterality: Right;  CDE: 26.18   . Cataract extraction w/phaco Left 06/14/2013    Procedure: CATARACT EXTRACTION PHACO AND INTRAOCULAR LENS PLACEMENT (IOC);  Surgeon: Loraine Leriche T. Nile Riggs, MD;  Location: [facility];  Service: Ophthalmology;  Laterality: Left;  CDE:11.35  . Colon resection      remote past per patient  . Endoscopic retrograde cholangiopancreatography (ercp) with propofol  10/29/2013    Procedure: ENDOSCOPIC RETROGRADE CHOLANGIOPANCREATOGRAPHY (ERCP) WITH PROPOFOL;  Surgeon: Petra Kuba, MD;  Location: [facility] END[facility];  Service: Endoscopy;;    Family History  Problem Relation Age of Onset  . Heart disease Other   . Heart disease Mother   . Hypertension Mother   . Heart disease Father   . Hypertension Father    Social History:  reports that she has never smoked. She has never used smokeless tobacco. She reports that she does not drink alcohol or use illicit drugs.  Allergies:  Allergies  Allergen Reactions  . Amiodarone Shortness Of Breath  . Brilinta [Ticagrelor] Shortness Of Breath and Nausea And Vomiting  . Iohexol Anaphylaxis       . Atorvastatin Other (See Comments)    Unknown-patient admitted to hospital after taking  . Citrus Dermatitis    BREAK OUT ON BODY  . Contrast Media [Iodinated Diagnostic Agents] Other (See  Comments)    Passed out  . Plavix [Clopidogrel Bisulfate]     Not an allergy - but PRU was drawn 06/2012 indicating Plavix hyporesponsiveness, thus she was changed to  Brilinta.  . Zolpidem Tartrate Other (See Comments)    Hallucinations  . Milk-Related Compounds Rash    Medications:                                                                                                                           Scheduled: . ampicillin-sulbactam (UNASYN) IV  1.5 g Intravenous Q6H  . antiseptic oral rinse  7 mL Mouth Rinse QID  . chlorhexidine  15 mL Mouth Rinse BID  . folic acid  1 mg Oral Daily  . furosemide  20 mg Intravenous BID  . heparin subcutaneous  5,000 Units Subcutaneous 3 times per day  . insulin aspart  0-15 Units Subcutaneous 6 times per day  . levothyroxine  25 mcg Oral QAC breakfast  . pantoprazole (PROTONIX) IV  40 mg Intravenous Q24H  . sodium chloride  3 mL Intravenous Q12H  . sodium chloride  3 mL Intravenous Q12H    ROS: patient becomes intermittently confused, fatigues easily, and thus can not reliably provide full ROS.              History obtained from chart review  Physical exam: pleasant female in no apparent distress. Blood pressure 178/77, pulse 84, temperature 98.1 F (36.7 C), temperature source Oral, resp. rate 26, height $RemoveBe'5\' 5"'JmimOyEIu$  (1.651 m), weight 92.5 kg (203 lb 14.8 oz), SpO2 95.00%. Head: normocephalic. Neck: supple, no bruits, no JVD. Cardiac: no murmurs. Lungs: clear. Abdomen: tender, no mass. Extremities: no edema. Neurologic Examination:                                                                                                      General: Mental Status: Alert and awake, disoriented to year-month-day, knows she is in the hospital. Speech fluent without evidence of aphasia.  Able to follow 3 step commands without difficulty. Cranial Nerves: II: Discs flat bilaterally; Visual fields can not be tested reliably but seem to be grossly intact, pupils  equal, round, reactive to light III,IV, VI: ptosis not present, extra-ocular motions intact bilaterally V,VII: smile symmetric, facial light touch sensation normal bilaterally VIII: hearing normal bilaterally IX,X: gag reflex present XI: bilateral shoulder shrug no tested XII: midline tongue Motor: Questionable right arm weakness Tone and bulk:normal tone throughout; no atrophy noted Sensory: withdraws to noxious stimuli Deep Tendon Reflexes:  Right: Upper Extremity   Left: Upper extremity   biceps (C-5 to C-6) 2/4   biceps (C-5 to C-6) 2/4 tricep (C7) 2/4    triceps (C7) 2/4 Brachioradialis (C6) 2/4  Brachioradialis (C6) 2/4  Lower Extremity Lower Extremity  quadriceps (L-2 to L-4) 2/4   quadriceps (L-2 to L-4) 2/4 Achilles (S1) 2/4   Achilles (S1) 2/4  Plantars: Right: downgoing   Left: downgoing Cerebellar: normal finger-to-nose, but doesn't follow instructions to test heel-to-shin Gait:  Unable to test    Results for orders placed during the hospital encounter of 11/10/2013 (from the past 48 hour(s))  CBC WITH DIFFERENTIAL     Status: Abnormal   Collection Time    11/03/2013  6:16 AM      Result Value Ref Range   WBC 10.9 (*) 4.0 - 10.5 K/uL   RBC 3.88  3.87 - 5.11 MIL/uL   Hemoglobin 11.6 (*) 12.0 - 15.0 g/dL   HCT 36.2  36.0 - 46.0 %   MCV 93.3  78.0 - 100.0 fL   MCH 29.9  26.0 - 34.0 pg   MCHC 32.0  30.0 - 36.0 g/dL   RDW 15.6 (*) 11.5 - 15.5 %   Platelets 212  150 - 400 K/uL   Neutrophils Relative % 55  43 - 77 %   Neutro Abs 6.1  1.7 - 7.7 K/uL   Lymphocytes Relative 30  12 - 46 %   Lymphs Abs 3.3  0.7 - 4.0 K/uL   Monocytes Relative 13 (*) 3 - 12 %   Monocytes Absolute 1.4 (*) 0.1 - 1.0 K/uL   Eosinophils Relative 1  0 - 5 %   Eosinophils Absolute 0.1  0.0 - 0.7 K/uL   Basophils Relative 1  0 - 1 %   Basophils Absolute 0.1  0.0 - 0.1 K/uL  COMPREHENSIVE METABOLIC PANEL     Status: Abnormal   Collection Time    10/29/2013  6:16 AM      Result Value Ref  Range   Sodium 142  137 - 147 mEq/L   Potassium 3.4 (*) 3.7 - 5.3 mEq/L   Chloride 104  96 - 112 mEq/L   CO2 27  19 - 32 mEq/L   Glucose, Bld 115 (*) 70 - 99 mg/dL   BUN 14  6 - 23 mg/dL   Creatinine, Ser 0.79  0.50 - 1.10 mg/dL   Calcium 8.7  8.4 - 10.5 mg/dL   Total Protein 6.3  6.0 - 8.3 g/dL   Albumin 2.1 (*) 3.5 - 5.2 g/dL   AST 21  0 - 37 U/L   ALT 51 (*) 0 - 35 U/L   Alkaline Phosphatase 264 (*) 39 - 117 U/L   Total Bilirubin 1.6 (*) 0.3 - 1.2 mg/dL   GFR calc non Af Amer 76 (*) >90 mL/min   GFR calc Af Amer 88 (*) >90 mL/min   Comment: (NOTE)     The eGFR has been calculated using the CKD EPI equation.     This calculation has not been validated in all clinical situations.     eGFR's persistently <90 mL/min signify possible Chronic Kidney     Disease.   Anion gap 11  5 - 15  PROTIME-INR     Status: None   Collection Time    11/17/2013  6:16 AM      Result Value Ref Range   Prothrombin Time 14.6  11.6 - 15.2 seconds   INR 1.14  0.00 - 1.49  TROPONIN I     Status: None   Collection Time    11/03/2013  6:16 AM      Result Value Ref Range   Troponin I <0.30  <  0.30 ng/mL   Comment:            Due to the release kinetics of cTnI,     a negative result within the first hours     of the onset of symptoms does not rule out     myocardial infarction with certainty.     If myocardial infarction is still suspected,     repeat the test at appropriate intervals.  GLUCOSE, CAPILLARY     Status: Abnormal   Collection Time    11/01/2013  7:42 AM      Result Value Ref Range   Glucose-Capillary 113 (*) 70 - 99 mg/dL  GLUCOSE, CAPILLARY     Status: None   Collection Time    11/10/2013 10:39 AM      Result Value Ref Range   Glucose-Capillary 99  70 - 99 mg/dL   Comment 1 Documented in Chart    MRSA PCR SCREENING     Status: None   Collection Time    11/15/2013  1:44 PM      Result Value Ref Range   MRSA by PCR NEGATIVE  NEGATIVE   Comment:            The GeneXpert MRSA Assay (FDA      approved for NASAL specimens     only), is one component of a     comprehensive MRSA colonization     surveillance program. It is not     intended to diagnose MRSA     infection nor to guide or     monitor treatment for     MRSA infections.  GLUCOSE, CAPILLARY     Status: Abnormal   Collection Time    11/11/2013  3:38 PM      Result Value Ref Range   Glucose-Capillary 142 (*) 70 - 99 mg/dL  LACTIC ACID, PLASMA     Status: None   Collection Time    11/07/2013  5:00 PM      Result Value Ref Range   Lactic Acid, Venous 1.3  0.5 - 2.2 mmol/L  BASIC METABOLIC PANEL     Status: Abnormal   Collection Time    10/28/2013  5:00 PM      Result Value Ref Range   Sodium 144  137 - 147 mEq/L   Potassium 3.5 (*) 3.7 - 5.3 mEq/L   Chloride 106  96 - 112 mEq/L   CO2 26  19 - 32 mEq/L   Glucose, Bld 128 (*) 70 - 99 mg/dL   BUN 17  6 - 23 mg/dL   Creatinine, Ser 0.78  0.50 - 1.10 mg/dL   Calcium 8.4  8.4 - 10.5 mg/dL   GFR calc non Af Amer 76 (*) >90 mL/min   GFR calc Af Amer 88 (*) >90 mL/min   Comment: (NOTE)     The eGFR has been calculated using the CKD EPI equation.     This calculation has not been validated in all clinical situations.     eGFR's persistently <90 mL/min signify possible Chronic Kidney     Disease.   Anion gap 12  5 - 15  TROPONIN I     Status: None   Collection Time    11/05/2013  5:00 PM      Result Value Ref Range   Troponin I <0.30  <0.30 ng/mL   Comment:            Due to the release kinetics of  cTnI,     a negative result within the first hours     of the onset of symptoms does not rule out     myocardial infarction with certainty.     If myocardial infarction is still suspected,     repeat the test at appropriate intervals.  GLUCOSE, CAPILLARY     Status: Abnormal   Collection Time    11/13/2013  7:30 PM      Result Value Ref Range   Glucose-Capillary 125 (*) 70 - 99 mg/dL   Comment 1 Capillary Sample    TROPONIN I     Status: Abnormal   Collection Time     10/30/2013  8:40 PM      Result Value Ref Range   Troponin I 0.32 (*) <0.30 ng/mL   Comment:            Due to the release kinetics of cTnI,     a negative result within the first hours     of the onset of symptoms does not rule out     myocardial infarction with certainty.     If myocardial infarction is still suspected,     repeat the test at appropriate intervals.     CRITICAL RESULT CALLED TO, READ BACK BY AND VERIFIED WITH:     F.ABERILON RN [id number] 11/02/2013 E.GADDY  GLUCOSE, CAPILLARY     Status: None   Collection Time    11/17/2013 11:18 PM      Result Value Ref Range   Glucose-Capillary 91  70 - 99 mg/dL   Comment 1 Capillary Sample    TROPONIN I     Status: Abnormal   Collection Time    10/28/13  2:50 AM      Result Value Ref Range   Troponin I 0.35 (*) <0.30 ng/mL   Comment:            Due to the release kinetics of cTnI,     a negative result within the first hours     of the onset of symptoms does not rule out     myocardial infarction with certainty.     If myocardial infarction is still suspected,     repeat the test at appropriate intervals.     CRITICAL VALUE NOTED.  VALUE IS CONSISTENT WITH PREVIOUSLY REPORTED AND CALLED VALUE.  PRO B NATRIURETIC PEPTIDE     Status: Abnormal   Collection Time    10/28/13  2:50 AM      Result Value Ref Range   Pro B Natriuretic peptide (BNP) 4804.0 (*) 0 - 450 pg/mL  COMPREHENSIVE METABOLIC PANEL     Status: Abnormal   Collection Time    10/28/13  3:50 AM      Result Value Ref Range   Sodium 145  137 - 147 mEq/L   Potassium 3.7  3.7 - 5.3 mEq/L   Chloride 105  96 - 112 mEq/L   CO2 23  19 - 32 mEq/L   Glucose, Bld 75  70 - 99 mg/dL   BUN 17  6 - 23 mg/dL   Creatinine, Ser 6.14  0.50 - 1.10 mg/dL   Calcium 8.7  8.4 - 43.1 mg/dL   Total Protein 6.2  6.0 - 8.3 g/dL   Albumin 2.1 (*) 3.5 - 5.2 g/dL   AST 540 (*) 0 - 37 U/L   ALT 77 (*) 0 - 35 U/L   Alkaline Phosphatase 498 (*) 39 - 117 U/L  Total Bilirubin 4.7 (*) 0.3 -  1.2 mg/dL   Comment: DELTA CHECK NOTED   GFR calc non Af Amer 76 (*) >90 mL/min   GFR calc Af Amer 88 (*) >90 mL/min   Comment: (NOTE)     The eGFR has been calculated using the CKD EPI equation.     This calculation has not been validated in all clinical situations.     eGFR's persistently <90 mL/min signify possible Chronic Kidney     Disease.   Anion gap 17 (*) 5 - 15  CBC     Status: Abnormal   Collection Time    10/28/13  3:50 AM      Result Value Ref Range   WBC 8.8  4.0 - 10.5 K/uL   RBC 3.82 (*) 3.87 - 5.11 MIL/uL   Hemoglobin 11.4 (*) 12.0 - 15.0 g/dL   HCT 36.3  36.0 - 46.0 %   MCV 95.0  78.0 - 100.0 fL   MCH 29.8  26.0 - 34.0 pg   MCHC 31.4  30.0 - 36.0 g/dL   RDW 16.1 (*) 11.5 - 15.5 %   Platelets 258  150 - 400 K/uL  LIPASE, BLOOD     Status: None   Collection Time    10/28/13  3:50 AM      Result Value Ref Range   Lipase 11  11 - 59 U/L  GLUCOSE, CAPILLARY     Status: None   Collection Time    10/28/13  4:09 AM      Result Value Ref Range   Glucose-Capillary 76  70 - 99 mg/dL   Comment 1 Capillary Sample    GLUCOSE, CAPILLARY     Status: None   Collection Time    10/28/13  7:50 AM      Result Value Ref Range   Glucose-Capillary 72  70 - 99 mg/dL   Comment 1 Capillary Sample    GLUCOSE, CAPILLARY     Status: None   Collection Time    10/28/13 11:41 AM      Result Value Ref Range   Glucose-Capillary 73  70 - 99 mg/dL   Comment 1 Capillary Sample    POCT I-STAT 3, ART BLOOD GAS (G3+)     Status: Abnormal   Collection Time    10/28/13  3:52 PM      Result Value Ref Range   pH, Arterial 7.331 (*) 7.350 - 7.450   pCO2 arterial 51.9 (*) 35.0 - 45.0 mmHg   pO2, Arterial 76.0 (*) 80.0 - 100.0 mmHg   Bicarbonate 27.6 (*) 20.0 - 24.0 mEq/L   TCO2 29  0 - 100 mmol/L   O2 Saturation 94.0     Acid-Base Excess 1.0  0.0 - 2.0 mmol/L   Patient temperature 97.6 F     Collection site RADIAL, ALLEN'S TEST ACCEPTABLE     Drawn by Operator     Sample type ARTERIAL     GLUCOSE, CAPILLARY     Status: Abnormal   Collection Time    10/28/13  4:36 PM      Result Value Ref Range   Glucose-Capillary 109 (*) 70 - 99 mg/dL   Comment 1 Capillary Sample    BLOOD GAS, ARTERIAL     Status: Abnormal   Collection Time    10/28/13  5:20 PM      Result Value Ref Range   O2 Content 4.0     Delivery systems NASAL CANNULA  pH, Arterial 7.351  7.350 - 7.450   pCO2 arterial 45.4 (*) 35.0 - 45.0 mmHg   pO2, Arterial 72.0 (*) 80.0 - 100.0 mmHg   Bicarbonate 24.4 (*) 20.0 - 24.0 mEq/L   TCO2 25.8  0 - 100 mmol/L   Acid-base deficit 0.4  0.0 - 2.0 mmol/L   O2 Saturation 93.6     Patient temperature 98.6     Collection site LEFT RADIAL     Drawn by [phone]     Sample type ARTERIAL DRAW     Allens test (pass/fail) PASS  PASS  GLUCOSE, CAPILLARY     Status: Abnormal   Collection Time    10/28/13  8:20 PM      Result Value Ref Range   Glucose-Capillary 149 (*) 70 - 99 mg/dL   Comment 1 Capillary Sample     Ct Head Wo Contrast  10/28/2013   CLINICAL DATA:  Altered mental status, post extubation  EXAM: CT HEAD WITHOUT CONTRAST  TECHNIQUE: Contiguous axial images were obtained from the base of the skull through the vertex without intravenous contrast.  COMPARISON:  September 20, 2011  FINDINGS: There is 2 x 4.3 cm low density in the medial left temporal lobe. There is chronic diffuse atrophy. There are old lacune infarctions in bilateral basal ganglia. There is no midline shift or hydrocephalus. There is no acute hemorrhage. The bony calvarium is intact. The visualized sinuses are clear.  IMPRESSION: Low density in the medial left temporal lobe with loss of gray-white differentiation. This can be seen in acute to subacute infarction. Further evaluation with MRI of brain is recommended.   Electronically Signed   By: Abelardo Diesel M.D.   On: 10/28/2013 19:48   Mr Brain Wo Contrast  10/28/2013   CLINICAL DATA:  80 year old female last seen normal at 0900 hr, acute altered mental  status, confusion and generalized weakness. Initial encounter. No report of fever.  EXAM: MRI HEAD WITHOUT CONTRAST  MRA HEAD WITHOUT CONTRAST  TECHNIQUE: Multiplanar, multiecho pulse sequences of the brain and surrounding structures were obtained without intravenous contrast. Angiographic images of the head were obtained using MRA technique without contrast.  COMPARISON:  Head CT without contrast 1942 hr the same day, and earlier. Brain MRI 03/29/2012.  FINDINGS: MRI HEAD FINDINGS  There is confluent restricted diffusion in the left mesial temporal lobe. The left hippocampal complex is affected as well as the parahippocampal gyrus. The amygdala is relatively spared. At the same time there is patchy subtle diffusion abnormality in the left thalamus (series 3, image 16). No involvement of the insula. Also, there may be some cortical involvement of the left middle temporal gyrus (series 5, image 15).  Fairly subtle associated gyral edema and T2 hyperintensity, with no significant FLAIR signal abnormality. Little if any decreased T1 signal. No evidence of acute hemorrhage. There is some chronic hemosiderin along the left tentorium which is stable (series 10, image 7). No mass effect.  Major intracranial vascular flow voids are stable, however there are subtle changes associated with the left distal P1 and P2 segment most apparent on series 8, image 12, but also evident comparing series 4, image 12 today to series 7, image 11 in 2014.  No contralateral or posterior fossa restricted diffusion.  Elsewhere stable gray and white matter signal since 2014. Negative pituitary, cervicomedullary junction. Stable visualized cervical spine. Visualized bone marrow signal is within normal limits. Interval postoperative changes to the globes. Stable paranasal sinuses and mastoids. Visualized scalp soft  tissues are within normal limits.  MRA HEAD FINDINGS  Study is intermittently degraded by motion artifact despite repeated imaging  attempts.  There is antegrade flow in the posterior circulation with dominant distal left vertebral artery. Diminutive right vertebral artery appears remain patent, and both PICA origins are patent. The basilar artery is patent with tortuosity but no definite stenosis. The SCA origins are patent. There is a fetal type right PCA origin.  The left PCA origin is patent, but the left P1 segment is occluded (series 6, image 78 and series 603, image 11). There is no distal left PCA flow identified.  Mildly irregular right PCA branches. Diminutive or absent left posterior communicating artery.  Antegrade flow in both ICA siphons. Tortuous distal cervical right ICA. ICA siphon irregularity but no hemodynamically significant siphon stenosis. Carotid termini are patent. Left ACA A1 segment is dominant. Fenestrated but otherwise normal anterior communicating artery. Visualized bilateral ACA branches are within normal limits. Visualized bilateral MCA branches are mildly irregular but patent, with no major MCA branch occlusion.  IMPRESSION: 1. Constellation of findings most compatible with acute left PCA territory infarct, primarily affecting the mesial left temporal lobe, but also involving the left thalamus and left middle temporal gyrus. MRA demonstrates left PCA occlusion in the P1 segment. 2. No associated hemorrhage. No significant mass effect at this time. Study discussed by telephone with Dr. Simonne Maffucci on 10/28/2013 at 22:10 .   Electronically Signed   By: Lars Pinks M.D.   On: 10/28/2013 22:11   Dg Chest Portable 1 View  10/28/2013   CLINICAL DATA:  Respiratory failure, hypertension, diabetes, coronary artery disease, ascending aortic aneurysm  EXAM: PORTABLE CHEST - 1 VIEW  COMPARISON:  10/21/2013  FINDINGS: Endotracheal tube with the tip 6 cm above the carina. Bilateral interstitial and alveolar airspace opacities. Bilateral layering pleural effusions. No pneumothorax. Enlarged central pulmonary vasculature.  Stable cardiomegaly. Prior CABG.  IMPRESSION: 1. Overall findings most concerning for pulmonary edema. 2. Endotracheal tube with the tip 6 cm above the carina.   Electronically Signed   By: Kathreen Devoid   On: 10/25/2013 15:49   Dg Ercp Biliary & Pancreatic Ducts  10/28/2013   CLINICAL DATA:  Bile duct obstruction, and cholecystitis and choledocholithiasis  EXAM: ERCP  TECHNIQUE: Multiple spot images obtained with the fluoroscopic device and submitted for interpretation post-procedure.  COMPARISON:  Ultrasound 10/24/2013 and earlier studies  FINDINGS: A series of 5 fluoroscopic spot images document endoscopic cannulation and opacification of the common bile duct. CBD is mildly distended. Variable filling defects are identified in the mid and distal CBD. Incomplete opacification of intrahepatic bile ducts, which appear decompressed centrally. The final image demonstrates decompression of the common duct with no definite residual filling defects.  IMPRESSION: 1. Choledocholithiasis with endoscopic decompression.  These images were submitted for radiologic interpretation only. Please see the procedural report for the amount of contrast and the fluoroscopy time utilized.   Electronically Signed   By: Arne Cleveland M.D.   On: 11/14/2013 14:06   Dg Abd Portable 1v  10/21/2013   CLINICAL DATA:  Orogastric tube placement.  EXAM: PORTABLE ABDOMEN - 1 VIEW  COMPARISON:  October 24, 2013.  FINDINGS: The bowel gas pattern is normal. Distal tip of tube is seen in the expected position the distal stomach. Surgical staples are noted in the pelvis in right side of the abdomen.  IMPRESSION: Distal tip of feeding tube is seen in distal stomach. No evidence of bowel obstruction or ileus.  Electronically Signed   By: Sabino Dick M.D.   On: 10/26/2013 17:44    Assessment: 80 y.o. female with a complicated medical history as delineated above, s/p ERCP 10/8, noted to be confused and lethargic around noon time today, and MRI  revealing a left PCA territory infarct. MRA showed  left PCA occlusion in the P1 segment. Unfortunately patient is not a candidate for thrombolysis as she was last seen well around noon time today. Complete stroke work up. Aspirin when patient passes swallowing evaluation. Stroke team will resume care in the morning.  Stroke Risk Factors - age, HTN, CAD, cardiomyopathy, PAF, severe right ICA stenosis.   Plan: 1. HgbA1c, fasting lipid panel 2. MRI, MRA  of the brain without contrast 3. Echocardiogram 4. Carotid dopplers 5. Prophylactic therapy-aspirin rectally until she passes swallowing eval 6. Risk factor modification 7. Telemetry monitoring 8. Frequent neuro checks    Dorian Pod, MD [facility] [phone]  10/28/2013, 10:18 PM

## 2013-10-28 NOTE — Progress Notes (Signed)
eLink Physician-Brief Progress Note Patient Name: Cathy KohlerFreda H [name] DOB: Jun 14, 1930 MRN: [id number]   Date of Service  10/28/2013  HPI/Events of Note  AMS, difficult to ascertain if exam is focal with RN bedside.  eICU Interventions  ABG Head CT        Karell Tukes 10/28/2013, 5:12 PM

## 2013-10-28 NOTE — Progress Notes (Signed)
Pt appears restless and tachypneic upon assessment. Thick, tan secretions visible through endotracheal tube and pt's lungs sound rhonchi throughout. O2 sats stable. Last BP 137/69. Notified Dr. Darrick Pennaeterding. [facility]. Will continue to monitor pt closely.

## 2013-10-28 NOTE — Progress Notes (Signed)
Spoke with son Willaim ShengBillie and updated on pt condition.  Son states pt normally sleeps a lot during the day, however is usually alert and oriented.

## 2013-10-28 NOTE — Progress Notes (Signed)
eLink Physician-Brief Progress Note Patient Name: Vella KohlerFreda H Lieder DOB: 1930/07/19 MRN: [id number]   Date of Service  10/28/2013  HPI/Events of Note    eICU Interventions  Son updated on phone about stroke     Intervention Category Minor Interventions: Communication with other healthcare providers and/or family  Max FickleMCQUAID, Suheily Birks 10/28/2013, 11:19 PM

## 2013-10-28 NOTE — Progress Notes (Signed)
[facility].  Patient has been engaged by a [employer] Care Coordinator.  Patient lives alone and has difficulty managing her care at home.  Made inpatient RNCM aware of home assessment.  Patient will receive a post discharge transition of care call and will be evaluated for monthly home visits for assessments and disease process education.  Made Inpatient Case Manager aware that [facility] following. Of note, [facility] services does not replace or interfere with any services that are arranged by inpatient case management or social work.  For additional questions or referrals please contact Anibal Hendersonim Henderson BSN RN [facility]iaison at [phone].

## 2013-10-28 NOTE — Progress Notes (Signed)
ABG results given to Dr. Tyson AliasFeinstein. Orders received for lasix 40mg  iv and narcan 0.4mg  iv.  Will continue to monitor.

## 2013-10-28 NOTE — Progress Notes (Signed)
eLink Physician-Brief Progress Note Patient Name: Cathy KohlerFreda H [name] DOB: [birth year] MRN: [id number]   Date of Service  10/28/2013  HPI/Events of Note  MRI showing stroke   eICU Interventions  MRA ordered Neurology consult     Intervention Category Intermediate Interventions: Diagnostic test evaluation  Jaena Brocato 10/28/2013, 9:19 PM

## 2013-10-28 NOTE — Consult Note (Signed)
PULMONARY / CRITICAL CARE MEDICINE   Name: Cathy KohlerFreda H [name] MRN: [id number] DOB: 1931/01/04    ADMISSION DATE:  11/18/2013 CONSULTATION DATE:  11/14/2013  REFERRING MD :  Lendell CapriceSullivan   CHIEF COMPLAINT:  Vent management   INITIAL PRESENTATION: 80 yo female with complex PMH including HTN, CAD, CKD (ischemic cardiomyopathy (EF25-30%) PAF and gallstones with mult prior admissions for cholecystits/cholelithiasis admitted 10/4 with intractable n/v and abd pain. Admitted with mult gallstones with dilated CBD as well as NSTEMI and acute on chronic renal failure. Course has been c/b EColi UTI as well as EColi bacteremia.  She was seen by GI and ultimately underwent ERCP 10/8.  She tol the procedure well but had some mild hypotension and required 50% FiO2 and was left intubated post procedure and PCCM consulted for ICU management.    STUDIES:  abd u/s 10/5>>> Cholelithiasis with gallbladder distention, Echogenic foci within a dilated common bile duct most concerning for choledocholithiasis. The common bile duct measures 15.5 mm in diameter. 2D echo 10/5>>> EF 30-35%, mod LVH, severely reduced systolic fx, global hypokinesis, grade 2 diastolic dysfunc, mod MR, PA press 51mmHg  SIGNIFICANT EVENTS: 10/8 ERCP>>>normal ampulla was signs of previous sphincterotomy status post increased sphincterotomy size 2 CBD duct  filled with stones sludge and debris removed as above 3 negative occlusion cholangiogram at the end of the procedure with multiple negative balloon pull-throughs and adequate biliary drainage   10/9 -remained intubated  SUBJECTIVE: awake, vented  VITAL SIGNS: Temp:  [98.3 F (36.8 C)-98.7 F (37.1 C)] 98.3 F (36.8 C) (10/09 0709) Pulse Rate:  [59-121] 59 (10/09 0709) Resp:  [11-27] 27 (10/09 0800) BP: (99-164)/(35-132) 134/58 mmHg (10/09 0800) SpO2:  [95 %-100 %] 99 % (10/09 0800) FiO2 (%):  [40 %-50 %] 40 % (10/09 0800) Weight:  [91.9 kg (202 lb 9.6 oz)-92.5 kg (203 lb 14.8 oz)] 92.5 kg  (203 lb 14.8 oz) (10/09 0500) HEMODYNAMICS:   VENTILATOR SETTINGS: Vent Mode:  [-] PSV;CPAP FiO2 (%):  [40 %-50 %] 40 % Set Rate:  [14 bmp] 14 bmp Vt Set:  [460 mL] 460 mL PEEP:  [5 cmH20] 5 cmH20 Pressure Support:  [5 cmH20] 5 cmH20 Plateau Pressure:  [19 cmH20-23 cmH20] 19 cmH20 INTAKE / OUTPUT:  Intake/Output Summary (Last 24 hours) at 10/28/13 1046 Last data filed at 10/28/13 0810  Gross per 24 hour  Intake   1437 ml  Output    600 ml  Net    837 ml    PHYSICAL EXAMINATION: General:  calm Neuro:  Sedated, RASS 0, opens eyes, will follow simple commands HEENT:  jvd wnl Cardiovascular:  s1s2 rrr sinus Lungs:  Scattered ronchi Abdomen:  Round, mildly distended, hypoactive bs, mild tenderness Musculoskeletal:  Warm and dry, 1+ BLE edema    LABS:  CBC  Recent Labs Lab 10/25/13 0619 11/19/2013 0616 10/28/13 0350  WBC 12.6* 10.9* 8.8  HGB 12.7 11.6* 11.4*  HCT 39.2 36.2 36.3  PLT 202 212 258   Coag's  Recent Labs Lab 11/04/2013 0616  INR 1.14   BMET  Recent Labs Lab 10/24/2013 0616 10/29/2013 1700 10/28/13 0350  NA 142 144 145  K 3.4* 3.5* 3.7  CL 104 106 105  CO2 27 26 23   BUN 14 17 17   CREATININE 0.79 0.78 0.79  GLUCOSE 115* 128* 75   Electrolytes  Recent Labs Lab 10/24/13 1430  11/03/2013 0616 10/25/2013 1700 10/28/13 0350  CALCIUM  --   < > 8.7 8.4 8.7  MG 2.0  --   --   --   --   < > =  values in this interval not displayed. Sepsis Markers  Recent Labs Lab November 15, 2013 1922 11/03/2013 1700  LATICACIDVEN 3.7* 1.3   ABG No results found for this basename: PHART, PCO2ART, PO2ART,  in the last 168 hours Liver Enzymes  Recent Labs Lab 10/25/13 0619 10/30/2013 0616 10/28/13 0350  AST 46* 21 150*  ALT 114* 51* 77*  ALKPHOS 210* 264* 498*  BILITOT 1.8* 1.6* 4.7*  ALBUMIN 2.7* 2.1* 2.1*   Cardiac Enzymes  Recent Labs Lab 11/15/2013 1757  11/02/2013 1700 11/07/2013 2040 10/28/13 0250  TROPONINI 0.41*  < > <0.30 0.32* 0.35*  PROBNP 2918.0*  --    --   --  4804.0*  < > = values in this interval not displayed. Glucose  Recent Labs Lab 10/25/2013 1039 10/26/2013 1538 11/04/2013 1930 11/05/2013 2318 10/28/13 0409 10/28/13 0750  GLUCAP 99 142* 125* 91 76 72    Imaging Dg Chest Portable 1 View  11/02/2013   CLINICAL DATA:  Respiratory failure, hypertension, diabetes, coronary artery disease, ascending aortic aneurysm  EXAM: PORTABLE CHEST - 1 VIEW  COMPARISON:  Nov 15, 2013  FINDINGS: Endotracheal tube with the tip 6 cm above the carina. Bilateral interstitial and alveolar airspace opacities. Bilateral layering pleural effusions. No pneumothorax. Enlarged central pulmonary vasculature. Stable cardiomegaly. Prior CABG.  IMPRESSION: 1. Overall findings most concerning for pulmonary edema. 2. Endotracheal tube with the tip 6 cm above the carina.   Electronically Signed   By: Elige Ko   On: 11/06/2013 15:49   Dg Ercp Biliary & Pancreatic Ducts  11/11/2013   CLINICAL DATA:  Bile duct obstruction, and cholecystitis and choledocholithiasis  EXAM: ERCP  TECHNIQUE: Multiple spot images obtained with the fluoroscopic device and submitted for interpretation post-procedure.  COMPARISON:  Ultrasound 10/24/2013 and earlier studies  FINDINGS: A series of 5 fluoroscopic spot images document endoscopic cannulation and opacification of the common bile duct. CBD is mildly distended. Variable filling defects are identified in the mid and distal CBD. Incomplete opacification of intrahepatic bile ducts, which appear decompressed centrally. The final image demonstrates decompression of the common duct with no definite residual filling defects.  IMPRESSION: 1. Choledocholithiasis with endoscopic decompression.  These images were submitted for radiologic interpretation only. Please see the procedural report for the amount of contrast and the fluoroscopy time utilized.   Electronically Signed   By: Oley Balm M.D.   On: 10/26/2013 14:06   Dg Abd Portable  1v  11/08/2013   CLINICAL DATA:  Orogastric tube placement.  EXAM: PORTABLE ABDOMEN - 1 VIEW  COMPARISON:  October 24, 2013.  FINDINGS: The bowel gas pattern is normal. Distal tip of tube is seen in the expected position the distal stomach. Surgical staples are noted in the pelvis in right side of the abdomen.  IMPRESSION: Distal tip of feeding tube is seen in distal stomach. No evidence of bowel obstruction or ileus.   Electronically Signed   By: Roque Lias M.D.   On: 10/26/2013 17:44     ASSESSMENT / PLAN:  PULMONARY OETT 10/8>>> Acute respiratory failure - multifactorial post procedure with underlying bacteremia, abd distension and AFib with RVR.   P:   consider neg balance pcxr in am , c/w this am with edema Weaning cpap 5 ps 5, goal 1 hr, assess rsbi  CARDIOVASCULAR ?NSTEMI - peak trop 0.41 H/O PAF Development of AFRVR periopratively 10/08 Hypotension  Hx chronic combined CHF Ischemic cardiomyopathy - EF 30-35% Chronic beta blocker therapy P:  Consider lasix restart soon Hold anticoagulation further  Phenylephrine not required Low dose PRN metoprolol to maintain HR < 115/min Resume coreg when BP improved   RENAL Hypokalemia mild, pulm edema P:   Replete k kvo Lasix low dose  GASTROINTESTINAL Choledocholithiasis without cholangitis - s/p ERCP 10/8  P:   Surgery note reviewed Once extubated, start clears Assess amy, lip in am with lft ppi   HEMATOLOGIC No active issue  P:  F/u cbc  scd consider sub q hep likley to consider systemic anticoagulation next day or so  INFECTIOUS EColi UTI  EColi bacteremia likely secondary to cholangitis  P:   BCx2  10/4>>> 1/2 EColi>>> Res cipro UC 10/4>>> EColi>>> Res cipro, levaquin  Unasyn 10/5>>>  If declines, change to zosyn, vanc  ENDOCRINE IDDM   Hypothyroid Glu wnl P:   Change SSI to q4 , feed asap Will hold lantus as not feeding and glu 70 Cont synthroid   NEUROLOGIC No active issue P:   RASS goal:  -1 Cont sedation while on vent  Daily WUA   Wean to extubate, feed then if ok, lft in am   Family updated: no family available    I have personally obtained a history, examined the patient, evaluated laboratory and imaging results, formulated the assessment and plan and placed orders. CRITICAL CARE: The patient is critically ill with multiple organ systems failure and requires high complexity decision making for assessment and support, frequent evaluation and titration of therapies, application of advanced monitoring technologies and extensive interpretation of multiple databases. Critical Care Time devoted to patient care services described in this note is 30 minutes.   Mcarthur Rossettianiel J. Tyson AliasFeinstein, MD, FACP Pgr: [phone] [facility]

## 2013-10-28 NOTE — Procedures (Signed)
Extubation Procedure Note  Patient Details:   Name: Cathy [name] DOB: 29-Oct-1930 MRN: [id number]   Airway Documentation:     Evaluation  O2 sats: stable throughout Complications: No apparent complications Patient did tolerate procedure well. Bilateral Breath Sounds: Clear Suctioning: Airway Yes  Patient extubated to 4L nasal cannula.  Positive cuff leak.  No evidence of stridor.  Incentive performed with minimal effort of a goal of 250.  Patient able to speak after extubation.  Sats currently 98%.  Vitals are stable.  Ledell NossBrown, Rubert Frediani N 10/28/2013, 11:19 AM

## 2013-10-29 ENCOUNTER — Inpatient Hospital Stay (HOSPITAL_COMMUNITY): Payer: Medicare HMO

## 2013-10-29 DIAGNOSIS — K831 Obstruction of bile duct: Secondary | ICD-10-CM

## 2013-10-29 DIAGNOSIS — I059 Rheumatic mitral valve disease, unspecified: Secondary | ICD-10-CM

## 2013-10-29 DIAGNOSIS — I639 Cerebral infarction, unspecified: Secondary | ICD-10-CM

## 2013-10-29 DIAGNOSIS — I6340 Cerebral infarction due to embolism of unspecified cerebral artery: Secondary | ICD-10-CM | POA: Insufficient documentation

## 2013-10-29 DIAGNOSIS — I4891 Unspecified atrial fibrillation: Secondary | ICD-10-CM

## 2013-10-29 LAB — COMPREHENSIVE METABOLIC PANEL
ALT: 73 U/L — ABNORMAL HIGH (ref 0–35)
AST: 57 U/L — ABNORMAL HIGH (ref 0–37)
Albumin: 2.2 g/dL — ABNORMAL LOW (ref 3.5–5.2)
Alkaline Phosphatase: 543 U/L — ABNORMAL HIGH (ref 39–117)
Anion gap: 16 — ABNORMAL HIGH (ref 5–15)
BUN: 20 mg/dL (ref 6–23)
CALCIUM: 8.6 mg/dL (ref 8.4–10.5)
CO2: 25 mEq/L (ref 19–32)
CREATININE: 0.72 mg/dL (ref 0.50–1.10)
Chloride: 106 mEq/L (ref 96–112)
GFR calc non Af Amer: 78 mL/min — ABNORMAL LOW (ref >90)
GLUCOSE: 146 mg/dL — AB (ref 70–99)
Potassium: 3.7 mEq/L (ref 3.7–5.3)
Sodium: 147 mEq/L (ref 137–147)
Total Bilirubin: 1.7 mg/dL — ABNORMAL HIGH (ref 0.3–1.2)
Total Protein: 6.9 g/dL (ref 6.0–8.3)

## 2013-10-29 LAB — CBC WITH DIFFERENTIAL/PLATELET
BASOS ABS: 0 10*3/uL (ref 0.0–0.1)
Basophils Relative: 0 % (ref 0–1)
EOS ABS: 0 10*3/uL (ref 0.0–0.7)
Eosinophils Relative: 0 % (ref 0–5)
HEMATOCRIT: 38.1 % (ref 36.0–46.0)
Hemoglobin: 12.2 g/dL (ref 12.0–15.0)
LYMPHS ABS: 1.5 10*3/uL (ref 0.7–4.0)
LYMPHS PCT: 16 % (ref 12–46)
MCH: 30.3 pg (ref 26.0–34.0)
MCHC: 32 g/dL (ref 30.0–36.0)
MCV: 94.5 fL (ref 78.0–100.0)
MONO ABS: 0.4 10*3/uL (ref 0.1–1.0)
Monocytes Relative: 4 % (ref 3–12)
Neutro Abs: 7.8 10*3/uL — ABNORMAL HIGH (ref 1.7–7.7)
Neutrophils Relative %: 80 % — ABNORMAL HIGH (ref 43–77)
Platelets: 302 10*3/uL (ref 150–400)
RBC: 4.03 MIL/uL (ref 3.87–5.11)
RDW: 15.8 % — ABNORMAL HIGH (ref 11.5–15.5)
WBC: 9.7 10*3/uL (ref 4.0–10.5)

## 2013-10-29 LAB — GLUCOSE, CAPILLARY
GLUCOSE-CAPILLARY: 135 mg/dL — AB (ref 70–99)
GLUCOSE-CAPILLARY: 139 mg/dL — AB (ref 70–99)
GLUCOSE-CAPILLARY: 154 mg/dL — AB (ref 70–99)
GLUCOSE-CAPILLARY: 176 mg/dL — AB (ref 70–99)
GLUCOSE-CAPILLARY: 182 mg/dL — AB (ref 70–99)
Glucose-Capillary: 131 mg/dL — ABNORMAL HIGH (ref 70–99)
Glucose-Capillary: 144 mg/dL — ABNORMAL HIGH (ref 70–99)
Glucose-Capillary: 157 mg/dL — ABNORMAL HIGH (ref 70–99)

## 2013-10-29 LAB — LIPID PANEL
CHOLESTEROL: 214 mg/dL — AB (ref 0–200)
HDL: 13 mg/dL — ABNORMAL LOW (ref >39)
LDL CALC: 155 mg/dL — AB (ref 0–99)
TRIGLYCERIDES: 229 mg/dL — AB (ref <150)
Total CHOL/HDL Ratio: 16.5 RATIO
VLDL: 46 mg/dL — ABNORMAL HIGH (ref 0–40)

## 2013-10-29 LAB — HEMOGLOBIN A1C
HEMOGLOBIN A1C: 8.7 % — AB (ref <5.7)
MEAN PLASMA GLUCOSE: 203 mg/dL — AB (ref <117)

## 2013-10-29 LAB — AMYLASE: AMYLASE: 19 U/L (ref 0–105)

## 2013-10-29 LAB — LIPASE, BLOOD: LIPASE: 13 U/L (ref 11–59)

## 2013-10-29 MED ORDER — JEVITY 1.2 CAL PO LIQD
1000.0000 mL | ORAL | Status: DC
  Administered 2013-10-29: 1000 mL
  Administered 2013-10-30: 60 mL/h
  Administered 2013-10-30: 65 mL/h
  Filled 2013-10-29 (×4): qty 1000

## 2013-10-29 MED ORDER — DILTIAZEM HCL 100 MG IV SOLR
5.0000 mg/h | INTRAVENOUS | Status: DC
  Administered 2013-10-29: 5 mg/h via INTRAVENOUS
  Administered 2013-10-29: 15 mg/h via INTRAVENOUS
  Filled 2013-10-29: qty 100

## 2013-10-29 MED ORDER — DILTIAZEM LOAD VIA INFUSION
15.0000 mg | Freq: Once | INTRAVENOUS | Status: AC
  Administered 2013-10-29: 15 mg via INTRAVENOUS
  Filled 2013-10-29: qty 15

## 2013-10-29 MED ORDER — VITAL HIGH PROTEIN PO LIQD
1000.0000 mL | ORAL | Status: DC
  Filled 2013-10-29 (×2): qty 1000

## 2013-10-29 NOTE — Progress Notes (Signed)
STROKE TEAM PROGRESS NOTE   HISTORY 80yo with hx of gallstones with prior admissions for cholecystits/cholelithiasis, HTN, HLD, CAD s/p CABG, DM, Ischemic Cardiomyopathy(EF 25-30%), hypothyroidism, CKD, statin intolerance, AFib noted 06/2012 on amiodarone who presents to with intractable n/v and abd pain. LFT's were noted to be elevated and pt was found to have multiple gallstones in a distended gallbladder with a dilated CBD. The patient has known CAD and carotid disease and was found to have a slight bump in trop to 0.4 on admit. She was seen by Cardiology who recommended an outpatient stress test. She was also considered to be at mod to high risk for adverse cardiac perioperative complications. As such, GI has recommended a transfer to [facility] to continue her care. Admitted 10/30/2013. Course has been c/b EColi UTI as well as EColi bacteremia. She was seen by GI and ultimately underwent ERCP 10/8. She tol the procedure well but had some mild hypotension and required 50% FiO2 and was left intubated post procedure and PCCM consulted for ICU management.   SUBJECTIVE (INTERVAL HISTORY) Per notes 10/9 patient was doing well post procedure, abdomen was feeling better and extubated on 10/9.  In the afternoon,  nurse noted that patient was very lethargic, not following commands. Received narcan without improvement and therefore a brain MRI was requested which showed findings consistent with an acute infarct involving the left PCA territory, primarily affecting the mesial left temporal lobe, but also involving the left thalamus and left middle temporal gyrus. MRA brain demonstrates left PCA occlusion in the P1 segment. A-fib with RVR noted.    OBJECTIVE Temp:  [97.3 F (36.3 C)-98.4 F (36.9 C)] 97.3 F (36.3 C) (10/10 0800) Pulse Rate:  [84-134] 126 (10/10 0330) Cardiac Rhythm:  [-] Atrial fibrillation (10/10 0400) Resp:  [14-31] 15 (10/10 1000) BP: (117-182)/(44-83) 166/55 mmHg (10/10 1000) SpO2:   [93 %-99 %] 97 % (10/10 1000) Weight:  [198 lb 13.7 oz (90.2 kg)] 198 lb 13.7 oz (90.2 kg) (10/10 0330)   Recent Labs Lab 10/28/13 2211 10/29/13 0018 10/29/13 0421 10/29/13 0756 10/29/13 1004  GLUCAP 146* 144* 135* 139* 154*    Recent Labs Lab 10/24/13 1430 10/25/13 0619 11-03-2013 0616 11-03-2013 1700 10/28/13 0350 10/29/13 0010  NA  --  141 142 144 145 147  K  --  3.8 3.4* 3.5* 3.7 3.7  CL  --  102 104 106 105 106  CO2  --  27 27 26 23 25   GLUCOSE  --  165* 115* 128* 75 146*  BUN  --  16 14 17 17 20   CREATININE  --  0.90 0.79 0.78 0.79 0.72  CALCIUM  --  8.7 8.7 8.4 8.7 8.6  MG 2.0  --   --   --   --   --     Recent Labs Lab 10/24/13 1430 10/25/13 0619 2013-11-03 0616 10/28/13 0350 10/29/13 0010  AST 186* 46* 21 150* 57*  ALT 211* 114* 51* 77* 73*  ALKPHOS 263* 210* 264* 498* 543*  BILITOT 4.7* 1.8* 1.6* 4.7* 1.7*  PROT 7.2 7.1 6.3 6.2 6.9  ALBUMIN 3.0* 2.7* 2.1* 2.1* 2.2*    Recent Labs Lab 11/04/2013 1733 10/25/13 0619 2013/11/03 0616 10/28/13 0350 10/29/13 0010  WBC 12.6* 12.6* 10.9* 8.8 9.7  NEUTROABS 10.1* 9.4* 6.1  --  7.8*  HGB 13.7 12.7 11.6* 11.4* 12.2  HCT 40.9 39.2 36.2 36.3 38.1  MCV 91.7 93.6 93.3 95.0 94.5  PLT 213 202 212 258 302  Recent Labs Lab 10/24/13 0230  10/24/13 1223 01-05-2014 0616 01-05-2014 1700 01-05-2014 2040 10/28/13 0250  CKTOTAL 121  --   --   --   --   --   --   CKMB 3.4  --   --   --   --   --   --   TROPONINI <0.30  < > 0.31* <0.30 <0.30 0.32* 0.35*  < > = values in this interval not displayed.  Recent Labs  01-05-2014 0616  LABPROT 14.6  INR 1.14   No results found for this basename: COLORURINE, APPERANCEUR, LABSPEC, PHURINE, GLUCOSEU, HGBUR, BILIRUBINUR, KETONESUR, PROTEINUR, UROBILINOGEN, NITRITE, LEUKOCYTESUR,  in the last 72 hours     Component Value Date/Time   CHOL 214* 10/29/2013 0010   TRIG 229* 10/29/2013 0010   HDL 13* 10/29/2013 0010   CHOLHDL 16.5 10/29/2013 0010   VLDL 46* 10/29/2013 0010    LDLCALC 155* 10/29/2013 0010   Lab Results  Component Value Date   HGBA1C 9.0* 10/24/2013   No results found for this basename: labopia, cocainscrnur, labbenz, amphetmu, thcu, labbarb    No results found for this basename: ETH,  in the last 168 hours  Ct Head Wo Contrast  10/28/2013   CLINICAL DATA:  IMPRESSION: Low density in the medial left temporal lobe with loss of gray-white differentiation. This can be seen in acute to subacute infarction. Further evaluation with MRI of brain is recommended.     Mr Brain Ilda BassetWo Contrast/Mr Mra Head Wo Contrast  10/28/2013   CLINICAL DATA:   IMPRESSION: 1. Constellation of findings most compatible with acute left PCA territory infarct, primarily affecting the mesial left temporal lobe, but also involving the left thalamus and left middle temporal gyrus. MRA demonstrates left PCA occlusion in the P1 segment. 2. No associated hemorrhage. No significant mass effect at this time. Study discussed by telephone with Dr. Max FickleUGLAS MCQUAID on 10/28/2013 at 22:10 .   Electronically Signed   By: Augusto GambleLee  Hall M.D.   On: 10/28/2013 22:11   Dg Chest Port 1 View  10/29/2013   CLINICAL DATA:  Subsequent encounter for acute respiratory failure.  EXAM: PORTABLE CHEST - 1 VIEW  COMPARISON:  One-view chest 02-12-13.  FINDINGS: The patient has been extubated. The heart is enlarged. Mild edema is improved. Atherosclerotic changes are again noted at the aortic. No focal airspace consolidation is evident.  IMPRESSION: 1. Cardiomegaly with improving interstitial edema. 2. Atherosclerosis.   Electronically Signed   By: Gennette Pachris  Mattern M.D.   On: 10/29/2013 07:32   Dg Chest Portable 1 View  10/26/2013   CLINICAL DATA:  Respiratory failure, hypertension, diabetes, coronary artery disease, ascending aortic aneurysm  EXAM: PORTABLE CHEST - 1 VIEW  COMPARISON:  11/05/2013  FINDINGS: Endotracheal tube with the tip 6 cm above the carina. Bilateral interstitial and alveolar airspace opacities.  Bilateral layering pleural effusions. No pneumothorax. Enlarged central pulmonary vasculature. Stable cardiomegaly. Prior CABG.  IMPRESSION: 1. Overall findings most concerning for pulmonary edema. 2. Endotracheal tube with the tip 6 cm above the carina.   Electronically Signed   By: Elige KoHetal  Patel   On: 02-12-13 15:49   Dg Ercp Biliary & Pancreatic Ducts  10/26/2013   CLINICAL DATA:  Bile duct obstruction, and cholecystitis and choledocholithiasis  EXAM: ERCP  TECHNIQUE: Multiple spot images obtained with the fluoroscopic device and submitted for interpretation post-procedure.  COMPARISON:  Ultrasound 10/24/2013 and earlier studies  FINDINGS: A series of 5 fluoroscopic spot images document endoscopic cannulation and  opacification of the common bile duct. CBD is mildly distended. Variable filling defects are identified in the mid and distal CBD. Incomplete opacification of intrahepatic bile ducts, which appear decompressed centrally. The final image demonstrates decompression of the common duct with no definite residual filling defects.  IMPRESSION: 1. Choledocholithiasis with endoscopic decompression.  These images were submitted for radiologic interpretation only. Please see the procedural report for the amount of contrast and the fluoroscopy time utilized.   Electronically Signed   By: Oley Balmaniel  Hassell M.D.   On: 10/26/2013 14:06   Dg Abd Portable 1v  10/28/2013   CLINICAL DATA:  Orogastric tube placement.  EXAM: PORTABLE ABDOMEN - 1 VIEW  COMPARISON:  October 24, 2013.  FINDINGS: The bowel gas pattern is normal. Distal tip of tube is seen in the expected position the distal stomach. Surgical staples are noted in the pelvis in right side of the abdomen.  IMPRESSION: Distal tip of feeding tube is seen in distal stomach. No evidence of bowel obstruction or ileus.   Electronically Signed   By: Roque LiasJames  Green M.D.   On: 10/24/2013 17:44     PHYSICAL EXAM  PHYSICAL EXAM Physical exam: Exam: Gen: NAD Eyes:  anicteric sclerae, moist conjunctivae                    CV: RRR, no MRG Mental Status: Alert, Not following commands  Neuro: Detailed Neurologic Exam  Speech:    Global aphasia  Cranial Nerves:    The pupils are equal, round, and reactive to light. No gaze preference. No visual neglect. face   Motor Observation:    no involuntary movements noted.     Strength:    Left arm anti-gravity, moving toes on the left. Right hemiparesis.       Cortical Sensory Modalities:     right visual neglect    ASSESSMENT/PLAN 80yo with hx of gallstones with prior admissions for cholecystits/cholelithiasis, HTN, HLD, CAD s/p CABG, DM, Ischemic Cardiomyopathy(EF 25-30%), hypothyroidism, CKD, statin intolerance, AFib noted 06/2012 on amiodarone who was admitted for intractable n/v and abd pain.  Admitted 11/06/2013. Course has been c/b EColi UTI as well as EColi bacteremia. She was seen by GI and ultimately underwent ERCP 10/8. Per notes 10/9 patient was doing well post procedure, abdomen was feeling better and extubated.  In the afternoon,  nurse noted that patient was very lethargic, not following commands. Received narcan without improvement and therefore a brain MRI was requested which showed cute left PCA territory infarct, primarily affecting the mesial left temporal lobe, but also involving the left thalamus and left middle temporal gyrus. MRA demonstrates left PCA occlusion in the P1 segment. 2. No associated hemorrhage. No significant mass effect at this time. [name].  A-fib with RVR noted during current admission.     Carotid Doppler; Pending  2D Echo: Completed, pending results  LDL 155: Notes state statin intolerance, need history of side effects. If true intolerability, can start non-statin management for HLD goal < 70  HgbA1c 9.0, uncontrolled  Ongoing aggressive risk factor management  Permissive hypertension <220/120 for 24-48 hours and then gradually normalize within 5-7 days  BP goal  long term normotensive  Will need to start anti-coagulation for Afib, Eliquis in one week   I have personally examined this patient, reviewed notes, independently viewed imaging studies, participated in medical decision making and plan of care. I have made any additions or clarifications directly to the above note. Agree with note above.  Sheralyn Boatmanoni  Lucia Gaskins, MD  [facility]  Pager: [phone] 10/28/2013 5:16 PM      To contact Stroke Continuity provider, please refer to [email or web address]. After hours, contact General Neurology

## 2013-10-29 NOTE — Progress Notes (Signed)
  Echocardiogram 2D Echocardiogram has been performed.  Georgian CoWILLIAMS, Valjean Ruppel 10/29/2013, 11:38 AM

## 2013-10-29 NOTE — Progress Notes (Signed)
[facility] Progress Note  Subjective: Extubated, Very lethargic minimally arousable, no apparent distress no complaints of abdominal pain  Objective: Vital signs in last 24 hours: Temp:  [97.6 F (36.4 C)-98.4 F (36.9 C)] 97.8 F (36.6 C) (10/10 0000) Pulse Rate:  [84-134] 126 (10/10 0330) Resp:  [14-31] 27 (10/10 0500) BP: (117-182)/(44-83) 131/77 mmHg (10/10 0500) SpO2:  [93 %-100 %] 93 % (10/10 0500) Weight:  [90.2 kg (198 lb 13.7 oz)] 90.2 kg (198 lb 13.7 oz) (10/10 0330) Weight change: -1.7 kg (-3 lb 12 oz)   PE: Abdomen soft  Lab Results: Results for orders placed during the hospital encounter of 11/15/2013 (from the past 24 hour(s))  GLUCOSE, CAPILLARY     Status: None   Collection Time    10/28/13 11:41 AM      Result Value Ref Range   Glucose-Capillary 73  70 - 99 mg/dL   Comment 1 Capillary Sample    POCT I-STAT 3, ART BLOOD GAS (G3+)     Status: Abnormal   Collection Time    10/28/13  3:52 PM      Result Value Ref Range   pH, Arterial 7.331 (*) 7.350 - 7.450   pCO2 arterial 51.9 (*) 35.0 - 45.0 mmHg   pO2, Arterial 76.0 (*) 80.0 - 100.0 mmHg   Bicarbonate 27.6 (*) 20.0 - 24.0 mEq/L   TCO2 29  0 - 100 mmol/L   O2 Saturation 94.0     Acid-Base Excess 1.0  0.0 - 2.0 mmol/L   Patient temperature 97.6 F     Collection site RADIAL, ALLEN'S TEST ACCEPTABLE     Drawn by Operator     Sample type ARTERIAL    GLUCOSE, CAPILLARY     Status: Abnormal   Collection Time    10/28/13  4:36 PM      Result Value Ref Range   Glucose-Capillary 109 (*) 70 - 99 mg/dL   Comment 1 Capillary Sample    BLOOD GAS, ARTERIAL     Status: Abnormal   Collection Time    10/28/13  5:20 PM      Result Value Ref Range   O2 Content 4.0     Delivery systems NASAL CANNULA     pH, Arterial 7.351  7.350 - 7.450   pCO2 arterial 45.4 (*) 35.0 - 45.0 mmHg   pO2, Arterial 72.0 (*) 80.0 - 100.0 mmHg   Bicarbonate 24.4 (*) 20.0 - 24.0 mEq/L   TCO2 25.8  0 - 100 mmol/L   Acid-base  deficit 0.4  0.0 - 2.0 mmol/L   O2 Saturation 93.6     Patient temperature 98.6     Collection site LEFT RADIAL     Drawn by [phone]     Sample type ARTERIAL DRAW     Allens test (pass/fail) PASS  PASS  GLUCOSE, CAPILLARY     Status: Abnormal   Collection Time    10/28/13  8:20 PM      Result Value Ref Range   Glucose-Capillary 149 (*) 70 - 99 mg/dL   Comment 1 Capillary Sample    GLUCOSE, CAPILLARY     Status: Abnormal   Collection Time    10/28/13 10:11 PM      Result Value Ref Range   Glucose-Capillary 146 (*) 70 - 99 mg/dL   Comment 1 Capillary Sample    CBC WITH DIFFERENTIAL     Status: Abnormal   Collection Time    10/29/13 12:10 AM  Result Value Ref Range   WBC 9.7  4.0 - 10.5 K/uL   RBC 4.03  3.87 - 5.11 MIL/uL   Hemoglobin 12.2  12.0 - 15.0 g/dL   HCT 16.1  09.6 - 04.5 %   MCV 94.5  78.0 - 100.0 fL   MCH 30.3  26.0 - 34.0 pg   MCHC 32.0  30.0 - 36.0 g/dL   RDW 40.9 (*) 81.1 - 91.4 %   Platelets 302  150 - 400 K/uL   Neutrophils Relative % 80 (*) 43 - 77 %   Neutro Abs 7.8 (*) 1.7 - 7.7 K/uL   Lymphocytes Relative 16  12 - 46 %   Lymphs Abs 1.5  0.7 - 4.0 K/uL   Monocytes Relative 4  3 - 12 %   Monocytes Absolute 0.4  0.1 - 1.0 K/uL   Eosinophils Relative 0  0 - 5 %   Eosinophils Absolute 0.0  0.0 - 0.7 K/uL   Basophils Relative 0  0 - 1 %   Basophils Absolute 0.0  0.0 - 0.1 K/uL  COMPREHENSIVE METABOLIC PANEL     Status: Abnormal   Collection Time    10/29/13 12:10 AM      Result Value Ref Range   Sodium 147  137 - 147 mEq/L   Potassium 3.7  3.7 - 5.3 mEq/L   Chloride 106  96 - 112 mEq/L   CO2 25  19 - 32 mEq/L   Glucose, Bld 146 (*) 70 - 99 mg/dL   BUN 20  6 - 23 mg/dL   Creatinine, Ser 7.82  0.50 - 1.10 mg/dL   Calcium 8.6  8.4 - 95.6 mg/dL   Total Protein 6.9  6.0 - 8.3 g/dL   Albumin 2.2 (*) 3.5 - 5.2 g/dL   AST 57 (*) 0 - 37 U/L   ALT 73 (*) 0 - 35 U/L   Alkaline Phosphatase 543 (*) 39 - 117 U/L   Total Bilirubin 1.7 (*) 0.3 - 1.2 mg/dL    GFR calc non Af Amer 78 (*) >90 mL/min   GFR calc Af Amer >90  >90 mL/min   Anion gap 16 (*) 5 - 15  AMYLASE     Status: None   Collection Time    10/29/13 12:10 AM      Result Value Ref Range   Amylase 19  0 - 105 U/L  LIPASE, BLOOD     Status: None   Collection Time    10/29/13 12:10 AM      Result Value Ref Range   Lipase 13  11 - 59 U/L  LIPID PANEL     Status: Abnormal   Collection Time    10/29/13 12:10 AM      Result Value Ref Range   Cholesterol 214 (*) 0 - 200 mg/dL   Triglycerides 213 (*) <150 mg/dL   HDL 13 (*) >08 mg/dL   Total CHOL/HDL Ratio 16.5     VLDL 46 (*) 0 - 40 mg/dL   LDL Cholesterol 657 (*) 0 - 99 mg/dL  GLUCOSE, CAPILLARY     Status: Abnormal   Collection Time    10/29/13 12:18 AM      Result Value Ref Range   Glucose-Capillary 144 (*) 70 - 99 mg/dL   Comment 1 Capillary Sample    GLUCOSE, CAPILLARY     Status: Abnormal   Collection Time    10/29/13  4:21 AM      Result Value  Ref Range   Glucose-Capillary 135 (*) 70 - 99 mg/dL    Studies/Results: Ct Head Wo Contrast  10/28/2013   CLINICAL DATA:  Altered mental status, post extubation  EXAM: CT HEAD WITHOUT CONTRAST  TECHNIQUE: Contiguous axial images were obtained from the base of the skull through the vertex without intravenous contrast.  COMPARISON:  September 20, 2011  FINDINGS: There is 2 x 4.3 cm low density in the medial left temporal lobe. There is chronic diffuse atrophy. There are old lacune infarctions in bilateral basal ganglia. There is no midline shift or hydrocephalus. There is no acute hemorrhage. The bony calvarium is intact. The visualized sinuses are clear.  IMPRESSION: Low density in the medial left temporal lobe with loss of gray-white differentiation. This can be seen in acute to subacute infarction. Further evaluation with MRI of brain is recommended.   Electronically Signed   By: Sherian Rein M.D.   On: 10/28/2013 19:48   Mr Shirlee Latch Wo Contrast  10/28/2013   CLINICAL DATA:   80 year old female last seen normal at 0900 hr, acute altered mental status, confusion and generalized weakness. Initial encounter. No report of fever.  EXAM: MRI HEAD WITHOUT CONTRAST  MRA HEAD WITHOUT CONTRAST  TECHNIQUE: Multiplanar, multiecho pulse sequences of the brain and surrounding structures were obtained without intravenous contrast. Angiographic images of the head were obtained using MRA technique without contrast.  COMPARISON:  Head CT without contrast 1942 hr the same day, and earlier. Brain MRI 03/29/2012.  FINDINGS: MRI HEAD FINDINGS  There is confluent restricted diffusion in the left mesial temporal lobe. The left hippocampal complex is affected as well as the parahippocampal gyrus. The amygdala is relatively spared. At the same time there is patchy subtle diffusion abnormality in the left thalamus (series 3, image 16). No involvement of the insula. Also, there may be some cortical involvement of the left middle temporal gyrus (series 5, image 15).  Fairly subtle associated gyral edema and T2 hyperintensity, with no significant FLAIR signal abnormality. Little if any decreased T1 signal. No evidence of acute hemorrhage. There is some chronic hemosiderin along the left tentorium which is stable (series 10, image 7). No mass effect.  Major intracranial vascular flow voids are stable, however there are subtle changes associated with the left distal P1 and P2 segment most apparent on series 8, image 12, but also evident comparing series 4, image 12 today to series 7, image 11 in 2014.  No contralateral or posterior fossa restricted diffusion.  Elsewhere stable gray and white matter signal since 2014. Negative pituitary, cervicomedullary junction. Stable visualized cervical spine. Visualized bone marrow signal is within normal limits. Interval postoperative changes to the globes. Stable paranasal sinuses and mastoids. Visualized scalp soft tissues are within normal limits.  MRA HEAD FINDINGS  Study is  intermittently degraded by motion artifact despite repeated imaging attempts.  There is antegrade flow in the posterior circulation with dominant distal left vertebral artery. Diminutive right vertebral artery appears remain patent, and both PICA origins are patent. The basilar artery is patent with tortuosity but no definite stenosis. The SCA origins are patent. There is a fetal type right PCA origin.  The left PCA origin is patent, but the left P1 segment is occluded (series 6, image 78 and series 603, image 11). There is no distal left PCA flow identified.  Mildly irregular right PCA branches. Diminutive or absent left posterior communicating artery.  Antegrade flow in both ICA siphons. Tortuous distal cervical right ICA. ICA siphon  irregularity but no hemodynamically significant siphon stenosis. Carotid termini are patent. Left ACA A1 segment is dominant. Fenestrated but otherwise normal anterior communicating artery. Visualized bilateral ACA branches are within normal limits. Visualized bilateral MCA branches are mildly irregular but patent, with no major MCA branch occlusion.  IMPRESSION: 1. Constellation of findings most compatible with acute left PCA territory infarct, primarily affecting the mesial left temporal lobe, but also involving the left thalamus and left middle temporal gyrus. MRA demonstrates left PCA occlusion in the P1 segment. 2. No associated hemorrhage. No significant mass effect at this time. Study discussed by telephone with Dr. Max FickleUGLAS MCQUAID on 10/28/2013 at 22:10 .   Electronically Signed   By: Augusto GambleLee  Hall M.D.   On: 10/28/2013 22:11   Mr Brain Wo Contrast  10/28/2013   CLINICAL DATA:  80 year old female last seen normal at 0900 hr, acute altered mental status, confusion and generalized weakness. Initial encounter. No report of fever.  EXAM: MRI HEAD WITHOUT CONTRAST  MRA HEAD WITHOUT CONTRAST  TECHNIQUE: Multiplanar, multiecho pulse sequences of the brain and surrounding structures  were obtained without intravenous contrast. Angiographic images of the head were obtained using MRA technique without contrast.  COMPARISON:  Head CT without contrast 1942 hr the same day, and earlier. Brain MRI 03/29/2012.  FINDINGS: MRI HEAD FINDINGS  There is confluent restricted diffusion in the left mesial temporal lobe. The left hippocampal complex is affected as well as the parahippocampal gyrus. The amygdala is relatively spared. At the same time there is patchy subtle diffusion abnormality in the left thalamus (series 3, image 16). No involvement of the insula. Also, there may be some cortical involvement of the left middle temporal gyrus (series 5, image 15).  Fairly subtle associated gyral edema and T2 hyperintensity, with no significant FLAIR signal abnormality. Little if any decreased T1 signal. No evidence of acute hemorrhage. There is some chronic hemosiderin along the left tentorium which is stable (series 10, image 7). No mass effect.  Major intracranial vascular flow voids are stable, however there are subtle changes associated with the left distal P1 and P2 segment most apparent on series 8, image 12, but also evident comparing series 4, image 12 today to series 7, image 11 in 2014.  No contralateral or posterior fossa restricted diffusion.  Elsewhere stable gray and white matter signal since 2014. Negative pituitary, cervicomedullary junction. Stable visualized cervical spine. Visualized bone marrow signal is within normal limits. Interval postoperative changes to the globes. Stable paranasal sinuses and mastoids. Visualized scalp soft tissues are within normal limits.  MRA HEAD FINDINGS  Study is intermittently degraded by motion artifact despite repeated imaging attempts.  There is antegrade flow in the posterior circulation with dominant distal left vertebral artery. Diminutive right vertebral artery appears remain patent, and both PICA origins are patent. The basilar artery is patent with  tortuosity but no definite stenosis. The SCA origins are patent. There is a fetal type right PCA origin.  The left PCA origin is patent, but the left P1 segment is occluded (series 6, image 78 and series 603, image 11). There is no distal left PCA flow identified.  Mildly irregular right PCA branches. Diminutive or absent left posterior communicating artery.  Antegrade flow in both ICA siphons. Tortuous distal cervical right ICA. ICA siphon irregularity but no hemodynamically significant siphon stenosis. Carotid termini are patent. Left ACA A1 segment is dominant. Fenestrated but otherwise normal anterior communicating artery. Visualized bilateral ACA branches are within normal limits. Visualized bilateral MCA  branches are mildly irregular but patent, with no major MCA branch occlusion.  IMPRESSION: 1. Constellation of findings most compatible with acute left PCA territory infarct, primarily affecting the mesial left temporal lobe, but also involving the left thalamus and left middle temporal gyrus. MRA demonstrates left PCA occlusion in the P1 segment. 2. No associated hemorrhage. No significant mass effect at this time. Study discussed by telephone with Dr. Max Fickle on 10/28/2013 at 22:10 .   Electronically Signed   By: Augusto Gamble M.D.   On: 10/28/2013 22:11   Dg Chest Port 1 View  10/29/2013   CLINICAL DATA:  Subsequent encounter for acute respiratory failure.  EXAM: PORTABLE CHEST - 1 VIEW  COMPARISON:  One-view chest 11-14-13.  FINDINGS: The patient has been extubated. The heart is enlarged. Mild edema is improved. Atherosclerotic changes are again noted at the aortic. No focal airspace consolidation is evident.  IMPRESSION: 1. Cardiomegaly with improving interstitial edema. 2. Atherosclerosis.   Electronically Signed   By: Gennette Pac M.D.   On: 10/29/2013 07:32   Dg Chest Portable 1 View  11/14/2013   CLINICAL DATA:  Respiratory failure, hypertension, diabetes, coronary artery disease,  ascending aortic aneurysm  EXAM: PORTABLE CHEST - 1 VIEW  COMPARISON:  11/06/2013  FINDINGS: Endotracheal tube with the tip 6 cm above the carina. Bilateral interstitial and alveolar airspace opacities. Bilateral layering pleural effusions. No pneumothorax. Enlarged central pulmonary vasculature. Stable cardiomegaly. Prior CABG.  IMPRESSION: 1. Overall findings most concerning for pulmonary edema. 2. Endotracheal tube with the tip 6 cm above the carina.   Electronically Signed   By: Elige Ko   On: 2013-11-14 15:49   Dg Ercp Biliary & Pancreatic Ducts  2013-11-14   CLINICAL DATA:  Bile duct obstruction, and cholecystitis and choledocholithiasis  EXAM: ERCP  TECHNIQUE: Multiple spot images obtained with the fluoroscopic device and submitted for interpretation post-procedure.  COMPARISON:  Ultrasound 10/24/2013 and earlier studies  FINDINGS: A series of 5 fluoroscopic spot images document endoscopic cannulation and opacification of the common bile duct. CBD is mildly distended. Variable filling defects are identified in the mid and distal CBD. Incomplete opacification of intrahepatic bile ducts, which appear decompressed centrally. The final image demonstrates decompression of the common duct with no definite residual filling defects.  IMPRESSION: 1. Choledocholithiasis with endoscopic decompression.  These images were submitted for radiologic interpretation only. Please see the procedural report for the amount of contrast and the fluoroscopy time utilized.   Electronically Signed   By: Oley Balm M.D.   On: 11-14-2013 14:06   Dg Abd Portable 1v  11/14/2013   CLINICAL DATA:  Orogastric tube placement.  EXAM: PORTABLE ABDOMEN - 1 VIEW  COMPARISON:  October 24, 2013.  FINDINGS: The bowel gas pattern is normal. Distal tip of tube is seen in the expected position the distal stomach. Surgical staples are noted in the pelvis in right side of the abdomen.  IMPRESSION: Distal tip of feeding tube is seen in distal  stomach. No evidence of bowel obstruction or ileus.   Electronically Signed   By: Roque Lias M.D.   On: Nov 14, 2013 17:44      Assessment: Multiple common bile duct stones, status post removal after sphincterotomy, liver function tests improved.  Plan: Advanced diet as tolerated if she is alert enough to take by mouth intake. [facility] we'll sign off for now. Please call if needed.    Cathy [name] C 10/29/2013, 8:18 AM

## 2013-10-29 NOTE — Progress Notes (Signed)
Pt only obeys command to grip with left hand and wiggly bilateral toes

## 2013-10-29 NOTE — Progress Notes (Signed)
*  PRELIMINARY RESULTS* Vascular Ultrasound Carotid Duplex (Doppler) has been completed.  Preliminary findings: Right = 80-99% ICA stenosis. Left = 1-39% ICA stenosis.  Atypical right vertebral flow, with loss of diastolic flow, suggesting distal obstruction. Patent, antegrade left vertebral flow.   Right ICA stenosis unchanged from previous study 05/04/12.   Farrel DemarkJill Eunice, RDMS, RVT  10/29/2013, 5:21 PM

## 2013-10-29 NOTE — Consult Note (Signed)
PULMONARY / CRITICAL CARE MEDICINE   Name: Cathy [name] MRN: [id number] DOB: April 20, 1930    ADMISSION DATE:  11/12/2013 CONSULTATION DATE:  10/26/2013  REFERRING MD :  Lendell CapriceSullivan   CHIEF COMPLAINT:  Vent management   INITIAL PRESENTATION: 80 yo female with complex PMH including HTN, CAD, CKD (ischemic cardiomyopathy (EF25-30%) PAF and gallstones with mult prior admissions for cholecystits/cholelithiasis admitted 10/4 with intractable n/v and abd pain. Admitted with mult gallstones with dilated CBD as well as NSTEMI and acute on chronic renal failure. Course has been c/b EColi UTI as well as EColi bacteremia.  She was seen by GI and ultimately underwent ERCP 10/8.  She tol the procedure well but had some mild hypotension and required 50% FiO2 and was left intubated post procedure and PCCM consulted for ICU management.    STUDIES:  abd u/s 10/5>>> Cholelithiasis with gallbladder distention, Echogenic foci within a dilated common bile duct most concerning for choledocholithiasis. The common bile duct measures 15.5 mm in diameter. 2D echo 10/5>>> EF 30-35%, mod LVH, severely reduced systolic fx, global hypokinesis, grade 2 diastolic dysfunc, mod MR, PA press 51mmHg CT 10/9 - Low density in the medial left temporal lobe with loss of gray-white  differentiation. This can be seen in acute to subacute infarction  MRI brain 10/9 - Constellation of findings most compatible with acute left PCA  territory infarct, primarily affecting the mesial left temporal  lobe, but also involving the left thalamus and left middle temporal  gyrus. MRA demonstrates left PCA occlusion in the P1 segment.  SIGNIFICANT EVENTS: 10/8 ERCP>>>normal ampulla was signs of previous sphincterotomy status post increased sphincterotomy size 2 CBD duct  filled with stones sludge and debris removed as above 3 negative occlusion cholangiogram at the end of the procedure with multiple negative balloon pull-throughs and adequate biliary  drainage  10/9 -remained intubated 10/9 extubated, sudden chang eiN MS, stroke Dx, fib, rvr  SUBJECTIVE: attempts to follow commands  VITAL SIGNS: Temp:  [97.3 F (36.3 C)-98.4 F (36.9 C)] 97.3 F (36.3 C) (10/10 0800) Pulse Rate:  [84-134] 126 (10/10 0330) Resp:  [14-31] 23 (10/10 0800) BP: (117-182)/(44-83) 141/52 mmHg (10/10 0800) SpO2:  [93 %-99 %] 96 % (10/10 0800) Weight:  [90.2 kg (198 lb 13.7 oz)] 90.2 kg (198 lb 13.7 oz) (10/10 0330) HEMODYNAMICS:   VENTILATOR SETTINGS:   INTAKE / OUTPUT:  Intake/Output Summary (Last 24 hours) at 10/29/13 1007 Last data filed at 10/29/13 0600  Gross per 24 hour  Intake 635.42 ml  Output   2190 ml  Net -1554.58 ml    PHYSICAL EXAMINATION: General:  calm Neuro:  Squeezes left, wingles toes, nodding, will localize, perr  HEENT:  jvd wnl Cardiovascular:  s1s2  Now SR Lungs:  CTA Abdomen:  Round, mildly distended, hypoactive bs, mild tenderness resolved but neurostatus Musculoskeletal:  Warm and dry, 1+ BLE edema    LABS:  CBC  Recent Labs Lab 02-Jun-2013 0616 10/28/13 0350 10/29/13 0010  WBC 10.9* 8.8 9.7  HGB 11.6* 11.4* 12.2  HCT 36.2 36.3 38.1  PLT 212 258 302   Coag's  Recent Labs Lab 02-Jun-2013 0616  INR 1.14   BMET  Recent Labs Lab 02-Jun-2013 1700 10/28/13 0350 10/29/13 0010  NA 144 145 147  K 3.5* 3.7 3.7  CL 106 105 106  CO2 26 23 25   BUN 17 17 20   CREATININE 0.78 0.79 0.72  GLUCOSE 128* 75 146*   Electrolytes  Recent Labs Lab 10/24/13 1430  02-Jun-2013  1700 10/28/13 0350 10/29/13 0010  CALCIUM  --   < > 8.4 8.7 8.6  MG 2.0  --   --   --   --   < > = values in this interval not displayed. Sepsis Markers  Recent Labs Lab 11/08/2013 1922 2013-11-12 1700  LATICACIDVEN 3.7* 1.3   ABG  Recent Labs Lab 10/28/13 1552 10/28/13 1720  PHART 7.331* 7.351  PCO2ART 51.9* 45.4*  PO2ART 76.0* 72.0*   Liver Enzymes  Recent Labs Lab 11/12/13 0616 10/28/13 0350 10/29/13 0010  AST 21 150*  57*  ALT 51* 77* 73*  ALKPHOS 264* 498* 543*  BILITOT 1.6* 4.7* 1.7*  ALBUMIN 2.1* 2.1* 2.2*   Cardiac Enzymes  Recent Labs Lab 11/17/2013 1757  11-12-2013 1700 11/12/2013 2040 10/28/13 0250  TROPONINI 0.41*  < > <0.30 0.32* 0.35*  PROBNP 2918.0*  --   --   --  4804.0*  < > = values in this interval not displayed. Glucose  Recent Labs Lab 10/28/13 1636 10/28/13 2020 10/28/13 2211 10/29/13 0018 10/29/13 0421 10/29/13 0756  GLUCAP 109* 149* 146* 144* 135* 139*    Imaging Ct Head Wo Contrast  10/28/2013   CLINICAL DATA:  Altered mental status, post extubation  EXAM: CT HEAD WITHOUT CONTRAST  TECHNIQUE: Contiguous axial images were obtained from the base of the skull through the vertex without intravenous contrast.  COMPARISON:  September 20, 2011  FINDINGS: There is 2 x 4.3 cm low density in the medial left temporal lobe. There is chronic diffuse atrophy. There are old lacune infarctions in bilateral basal ganglia. There is no midline shift or hydrocephalus. There is no acute hemorrhage. The bony calvarium is intact. The visualized sinuses are clear.  IMPRESSION: Low density in the medial left temporal lobe with loss of gray-white differentiation. This can be seen in acute to subacute infarction. Further evaluation with MRI of brain is recommended.   Electronically Signed   By: Sherian Rein M.D.   On: 10/28/2013 19:48   Mr Shirlee Latch Wo Contrast  10/28/2013   CLINICAL DATA:  80 year old female last seen normal at 0900 hr, acute altered mental status, confusion and generalized weakness. Initial encounter. No report of fever.  EXAM: MRI HEAD WITHOUT CONTRAST  MRA HEAD WITHOUT CONTRAST  TECHNIQUE: Multiplanar, multiecho pulse sequences of the brain and surrounding structures were obtained without intravenous contrast. Angiographic images of the head were obtained using MRA technique without contrast.  COMPARISON:  Head CT without contrast 1942 hr the same day, and earlier. Brain MRI 03/29/2012.   FINDINGS: MRI HEAD FINDINGS  There is confluent restricted diffusion in the left mesial temporal lobe. The left hippocampal complex is affected as well as the parahippocampal gyrus. The amygdala is relatively spared. At the same time there is patchy subtle diffusion abnormality in the left thalamus (series 3, image 16). No involvement of the insula. Also, there may be some cortical involvement of the left middle temporal gyrus (series 5, image 15).  Fairly subtle associated gyral edema and T2 hyperintensity, with no significant FLAIR signal abnormality. Little if any decreased T1 signal. No evidence of acute hemorrhage. There is some chronic hemosiderin along the left tentorium which is stable (series 10, image 7). No mass effect.  Major intracranial vascular flow voids are stable, however there are subtle changes associated with the left distal P1 and P2 segment most apparent on series 8, image 12, but also evident comparing series 4, image 12 today to series 7, image 11 in  2014.  No contralateral or posterior fossa restricted diffusion.  Elsewhere stable gray and white matter signal since 2014. Negative pituitary, cervicomedullary junction. Stable visualized cervical spine. Visualized bone marrow signal is within normal limits. Interval postoperative changes to the globes. Stable paranasal sinuses and mastoids. Visualized scalp soft tissues are within normal limits.  MRA HEAD FINDINGS  Study is intermittently degraded by motion artifact despite repeated imaging attempts.  There is antegrade flow in the posterior circulation with dominant distal left vertebral artery. Diminutive right vertebral artery appears remain patent, and both PICA origins are patent. The basilar artery is patent with tortuosity but no definite stenosis. The SCA origins are patent. There is a fetal type right PCA origin.  The left PCA origin is patent, but the left P1 segment is occluded (series 6, image 78 and series 603, image 11). There is  no distal left PCA flow identified.  Mildly irregular right PCA branches. Diminutive or absent left posterior communicating artery.  Antegrade flow in both ICA siphons. Tortuous distal cervical right ICA. ICA siphon irregularity but no hemodynamically significant siphon stenosis. Carotid termini are patent. Left ACA A1 segment is dominant. Fenestrated but otherwise normal anterior communicating artery. Visualized bilateral ACA branches are within normal limits. Visualized bilateral MCA branches are mildly irregular but patent, with no major MCA branch occlusion.  IMPRESSION: 1. Constellation of findings most compatible with acute left PCA territory infarct, primarily affecting the mesial left temporal lobe, but also involving the left thalamus and left middle temporal gyrus. MRA demonstrates left PCA occlusion in the P1 segment. 2. No associated hemorrhage. No significant mass effect at this time. Study discussed by telephone with Dr. Max FickleUGLAS MCQUAID on 10/28/2013 at 22:10 .   Electronically Signed   By: Augusto GambleLee  Hall M.D.   On: 10/28/2013 22:11   Mr Brain Wo Contrast  10/28/2013   CLINICAL DATA:  80 year old female last seen normal at 0900 hr, acute altered mental status, confusion and generalized weakness. Initial encounter. No report of fever.  EXAM: MRI HEAD WITHOUT CONTRAST  MRA HEAD WITHOUT CONTRAST  TECHNIQUE: Multiplanar, multiecho pulse sequences of the brain and surrounding structures were obtained without intravenous contrast. Angiographic images of the head were obtained using MRA technique without contrast.  COMPARISON:  Head CT without contrast 1942 hr the same day, and earlier. Brain MRI 03/29/2012.  FINDINGS: MRI HEAD FINDINGS  There is confluent restricted diffusion in the left mesial temporal lobe. The left hippocampal complex is affected as well as the parahippocampal gyrus. The amygdala is relatively spared. At the same time there is patchy subtle diffusion abnormality in the left thalamus (series  3, image 16). No involvement of the insula. Also, there may be some cortical involvement of the left middle temporal gyrus (series 5, image 15).  Fairly subtle associated gyral edema and T2 hyperintensity, with no significant FLAIR signal abnormality. Little if any decreased T1 signal. No evidence of acute hemorrhage. There is some chronic hemosiderin along the left tentorium which is stable (series 10, image 7). No mass effect.  Major intracranial vascular flow voids are stable, however there are subtle changes associated with the left distal P1 and P2 segment most apparent on series 8, image 12, but also evident comparing series 4, image 12 today to series 7, image 11 in 2014.  No contralateral or posterior fossa restricted diffusion.  Elsewhere stable gray and white matter signal since 2014. Negative pituitary, cervicomedullary junction. Stable visualized cervical spine. Visualized bone marrow signal is within normal limits.  Interval postoperative changes to the globes. Stable paranasal sinuses and mastoids. Visualized scalp soft tissues are within normal limits.  MRA HEAD FINDINGS  Study is intermittently degraded by motion artifact despite repeated imaging attempts.  There is antegrade flow in the posterior circulation with dominant distal left vertebral artery. Diminutive right vertebral artery appears remain patent, and both PICA origins are patent. The basilar artery is patent with tortuosity but no definite stenosis. The SCA origins are patent. There is a fetal type right PCA origin.  The left PCA origin is patent, but the left P1 segment is occluded (series 6, image 78 and series 603, image 11). There is no distal left PCA flow identified.  Mildly irregular right PCA branches. Diminutive or absent left posterior communicating artery.  Antegrade flow in both ICA siphons. Tortuous distal cervical right ICA. ICA siphon irregularity but no hemodynamically significant siphon stenosis. Carotid termini are  patent. Left ACA A1 segment is dominant. Fenestrated but otherwise normal anterior communicating artery. Visualized bilateral ACA branches are within normal limits. Visualized bilateral MCA branches are mildly irregular but patent, with no major MCA branch occlusion.  IMPRESSION: 1. Constellation of findings most compatible with acute left PCA territory infarct, primarily affecting the mesial left temporal lobe, but also involving the left thalamus and left middle temporal gyrus. MRA demonstrates left PCA occlusion in the P1 segment. 2. No associated hemorrhage. No significant mass effect at this time. Study discussed by telephone with Dr. Max Fickle on 10/28/2013 at 22:10 .   Electronically Signed   By: Augusto Gamble M.D.   On: 10/28/2013 22:11     ASSESSMENT / PLAN:  PULMONARY OETT 10/8>>>10/9 Acute respiratory failure - multifactorial post procedure with underlying bacteremia, abd distension and AFib with RVR.   P:   Protecting airway  CARDIOVASCULAR ?NSTEMI - peak trop 0.41 H/O PAF Development of AFRVR periopratively 10/08, reoccurence 10/9 Hypotension  Hx chronic combined CHF Ischemic cardiomyopathy - EF 30-35% Chronic beta blocker therapy P:  Would have permissive HTN, would tolerate sys 200-220 Would avoid map less 75 In SR, dc cardizem, wean to off Hold anticoagulation further until d/w neuro on safety hemorrhagic conversion risk Low dose PRN metoprolol for further rate issues if rvr developes  RENAL Mild hypernatremia P:   kvo Allow na to rise with brain edema risk, would tolerate NA 154 Chem in am   GASTROINTESTINAL Choledocholithiasis without cholangitis - s/p ERCP 10/8 Dysphagia likley with cva P:   Place NGT and feed slp in future ppi   HEMATOLOGIC No active issue  P:  scd sub q hep likley to consider systemic anticoagulation in future after d/w neuro  INFECTIOUS EColi UTI  EColi bacteremia likely secondary to cholangitis  P:   BCx2  10/4>>> 1/2  EColi>>> Res cipro UC 10/4>>> EColi>>> Res cipro, levaquin  Unasyn 10/5>>>  Will establish stop date in next 24 hrs  ENDOCRINE IDDM   Hypothyroid Glu wnl P:   Change SSI to q4 , feed asap then lantus in future Cont synthroid   NEUROLOGIC Acute CVA, presumed embolic P:   Asa, place NGT Stroke workup per neuro Echo for clot Then d/w neuro about safety anticoahulation in future, in 1 week? 2 weeks? Will need PT in future Permissive HTN Allow na to rise   Family updated: no family available   Global: CVA noted, wean Cardizem, feed, asa, echo, permissive HTN  I have personally obtained a history, examined the patient, evaluated laboratory and imaging results, formulated the assessment  and plan and placed orders. CRITICAL CARE: The patient is critically ill with multiple organ systems failure and requires high complexity decision making for assessment and support, frequent evaluation and titration of therapies, application of advanced monitoring technologies and extensive interpretation of multiple databases. Critical Care Time devoted to patient care services described in this note is 30 minutes.   Mcarthur Rossetti. Tyson Alias, MD, FACP Pgr: [phone] [facility]

## 2013-10-29 NOTE — Progress Notes (Signed)
eLink Physician-Brief Progress Note Patient Name: Cathy KohlerFreda H Gendreau DOB: Jun 08, 1930 MRN: [id number]   Date of Service  10/29/2013  HPI/Events of Note  Fm requesting DNR status in event of arrest  eICU Interventions  Changed to DNR status     Intervention Category Major Interventions: End of life / care limitation discussion  Sandrea HughsMichael Fayette [place] 10/29/2013, 4:25 PM

## 2013-10-29 NOTE — Progress Notes (Signed)
eLink Physician-Brief Progress Note Patient Name: Cathy KohlerFreda H Werntz DOB: 1930-10-04 MRN: [id number]   Date of Service  10/29/2013  HPI/Events of Note  A-fib with RVR Not responding to metoprolol  eICU Interventions  dilt gtt     Intervention Category Intermediate Interventions: Arrhythmia - evaluation and management  MCQUAID, DOUGLAS 10/29/2013, 12:43 AM

## 2013-10-29 NOTE — Progress Notes (Signed)
INITIAL NUTRITION ASSESSMENT  DOCUMENTATION CODES Per approved criteria  -Obesity Unspecified   INTERVENTION: Initiate Jevity 1.2 formula via NGT at 30 ml/hr and increase by 10 ml every 4 hours to goal rate of 65 ml/hr to provide 1872 kcals, 87 grams of protein, and 1264 ml of free water.   TF regimen is providing 100% of estimated nutrition needs.  Will continue to follow.  NUTRITION DIAGNOSIS: Inadequate oral intake related to dysphagia likely with CVA as evidenced by poor po intake and meal completion of 0-50%.   Goal: Pt to meet >/= 90% of their estimated nutrition needs   Monitor:  TF initiation and tolerance, weight trends, labs, I/O's  Reason for Assessment: MD consult for enteral/tube feeding initiation and management  80 y.o. female  Admitting Dx: Choledocholithiasis  ASSESSMENT: PMH including HTN, CAD, CKD  PAF and gallstones with mult prior admissions for cholecystits/cholelithiasis admitted 10/4 with intractable n/v and abd pain. Admitted with mult gallstones with dilated CBD as well as NSTEMI and acute on chronic renal failure.  Pt extubated on 10/9 and was found with stroke dx. RD consulted for TF initiation and management. Per MD note, pt with dysphagia likely from CVA and SLP evaluation to be done in the future. Pt with brain edema risk- will not order free water. Pt unresponsive to questions asked. Spoke with family at bedside. They report pt has been eating well prior to admission along with her weight being stable. Spoke with student nurse pt has not eaten anything since 10/6. Will continue to monitor.  Pt with no significant fat or muscle mass loss.  Labs: High ALT, AST, alkaline phosphatase, and total bilirubin.  Height: Ht Readings from Last 1 Encounters:  10/25/13 5\' 5"  (1.651 m)    Weight: Wt Readings from Last 1 Encounters:  10/29/13 198 lb 13.7 oz (90.2 kg)    Ideal Body Weight: 125 lbs  % Ideal Body Weight: 158%  Wt Readings from Last 10  Encounters:  10/29/13 198 lb 13.7 oz (90.2 kg)  10/29/13 198 lb 13.7 oz (90.2 kg)  08/17/13 194 lb (87.998 kg)  07/28/13 200 lb 2.8 oz (90.8 kg)  07/25/13 199 lb (90.266 kg)  07/15/13 200 lb (90.719 kg)  06/14/13 202 lb (91.627 kg)  06/14/13 202 lb (91.627 kg)  05/24/13 202 lb (91.627 kg)  05/04/13 202 lb (91.627 kg)    Usual Body Weight: 200 lbs  % Usual Body Weight: 99%  BMI:  Body mass index is 33.09 kg/(m^2). Class I obesity  Estimated Nutritional Needs: Kcal: 1750-1950 Protein: 85-95 grams Fluid: 1.75 - 1.95 L/day  Skin: +1 LE edema  Diet Order: Clear Liquid  EDUCATION NEEDS: -No education needs identified at this time   Intake/Output Summary (Last 24 hours) at 10/29/13 1446 Last data filed at 10/29/13 1350  Gross per 24 hour  Intake 430.75 ml  Output   2240 ml  Net -1809.25 ml    Last BM: 10/5  Labs:   Recent Labs Lab 10/24/13 1430  11/14/2013 1700 10/28/13 0350 10/29/13 0010  NA  --   < > 144 145 147  K  --   < > 3.5* 3.7 3.7  CL  --   < > 106 105 106  CO2  --   < > 26 23 25   BUN  --   < > 17 17 20   CREATININE  --   < > 0.78 0.79 0.72  CALCIUM  --   < > 8.4 8.7 8.6  MG 2.0  --   --   --   --   GLUCOSE  --   < > 128* 75 146*  < > = values in this interval not displayed.  CBG (last 3)   Recent Labs  10/29/13 0756 10/29/13 1004 10/29/13 1206  GLUCAP 139* 154* 176*    Scheduled Meds: . ampicillin-sulbactam (UNASYN) IV  1.5 g Intravenous Q6H  . antiseptic oral rinse  7 mL Mouth Rinse QID  . aspirin EC  81 mg Oral Daily  . chlorhexidine  15 mL Mouth Rinse BID  . feeding supplement (VITAL HIGH PROTEIN)  1,000 mL Per Tube Q24H  . folic acid  1 mg Oral Daily  . furosemide  20 mg Intravenous BID  . heparin subcutaneous  5,000 Units Subcutaneous 3 times per day  . insulin aspart  0-15 Units Subcutaneous 6 times per day  . levothyroxine  25 mcg Oral QAC breakfast  . pantoprazole (PROTONIX) IV  40 mg Intravenous Q24H  . sodium chloride  3 mL  Intravenous Q12H  . sodium chloride  3 mL Intravenous Q12H    Continuous Infusions: . diltiazem (CARDIZEM) infusion Stopped (10/29/13 1024)  . phenylephrine (NEO-SYNEPHRINE) Adult infusion Stopped (10/29/2013 1900)  . sodium chloride 0.45 % with kcl 10 mL/hr at 10/29/13 0021    Past Medical History  Diagnosis Date  . Hypertension     Unspecified  . Hyperlipidemia     Mixed, statin intolerance.  [name]. CAD (coronary artery disease)     a. CAD s/p CABG 2003. b. Cath 2006: occluded OM vein graft. c. Anterior MI 04/15/12 s/p PTCA only to ramus (stenting unsuccessful at that time) d. NSTEMI 06/2012: failed med rx, s/p DES to ramus c/b vessel perforation tx with balloon/stenting; e. 02/1013 Cath: LM nl, LAD mod dzs mid, LIMA ok, LCX 15283m, OM1 patent stent, VG->OM 100, RCA 100p, VG->PDA ok, EF 40-45%->Med Rx.  . S/P CABG (coronary artery bypass graft) 2003     2003  LIMA, LAD, SVG to the OM, SVG right coronary artery  . Thoracic ascending aortic aneurysm 2003    Ascending thoracic aneurysm repair with a graft at time of CABG  . Diabetes mellitus     Insulin dependent  . Gastroparesis   . Depression   . Allergy history, radiographic dye     Contrast dye allergy  . Previous back surgery   . Syncope 2008    Question?  2008  . Chronic systolic heart failure   . Ischemic cardiomyopathy 06/2011    a. Echo 04/19/12: EF 25-30%, mod LVH, diffuse HK, trivial AI  . Hypotension     October, 2013  . Hypothyroidism     Treated with low-dose Synthroid  . CKD (chronic kidney disease)   . Carotid artery disease     a. 80-99% prox RICA stenosis - with recent cardiac event 06/2012, favor waiting at least 1 month, optimally 3 months post PCI.  [name]. Hypertriglyceridemia   . Statin intolerance   . Peripheral neuropathy   . PAF (paroxysmal atrial fibrillation)     a. Noted during 06/2012 hospitalization. Placed on amiodarone. Plan is for event monitor to assess for breakthrough, hold off anticoag unless additional AF  seen.  [name]. NSVT (nonsustained ventricular tachycardia)     a. Noted during 06/2012 hospitalization.  . Plavix Non-responder     a. Also allergic to brilinta.    Past Surgical History  Procedure Laterality Date  . Coronary artery bypass graft  05/05/2001  LIMA, LAD, SVG to the OM, SVG right coronary artery  . Bladder tacking    . Salivary gland surgery    . Total abdominal hysterectomy    . Back surgery      X2   . Breast surgery    . External ear surgery      X2   . Cardiac catheterization  04/15/12, 07/05/12    60-70% proximal LAD, 95% mid AV groove circumflex s/p PTCA, proximal RCA occlusion, patent LIMA to LAD, patent SVG to distal RCA, chronically occluded SVG to circumflex; LVEF 25-30%  . Ercp N/A 11/04/2012    Dr. Ewing Schlein: bulbous ampulla, multiple CBD stones and debris status post removal/extraction, sphincterotomy  . Cataract extraction w/phaco Right 05/31/2013    Procedure: CATARACT EXTRACTION PHACO AND INTRAOCULAR LENS PLACEMENT (IOC);  Surgeon: Loraine Leriche T. Nile Riggs, MD;  Location: [facility];  Service: Ophthalmology;  Laterality: Right;  CDE: 26.18   . Cataract extraction w/phaco Left 06/14/2013    Procedure: CATARACT EXTRACTION PHACO AND INTRAOCULAR LENS PLACEMENT (IOC);  Surgeon: Loraine Leriche T. Nile Riggs, MD;  Location: [facility];  Service: Ophthalmology;  Laterality: Left;  CDE:11.35  . Colon resection      remote past per patient  . Endoscopic retrograde cholangiopancreatography (ercp) with propofol  11-04-2013    Procedure: ENDOSCOPIC RETROGRADE CHOLANGIOPANCREATOGRAPHY (ERCP) WITH PROPOFOL;  Surgeon: Petra Kuba, MD;  Location: [facility] END[facility];  Service: Endoscopy;;    Marijean Niemann, MS, RD, Provisional LDN Pager # [phone] After hours/ weekend pager # [phone]

## 2013-10-30 ENCOUNTER — Inpatient Hospital Stay (HOSPITAL_COMMUNITY): Payer: Medicare HMO

## 2013-10-30 DIAGNOSIS — I509 Heart failure, unspecified: Secondary | ICD-10-CM

## 2013-10-30 LAB — BASIC METABOLIC PANEL
Anion gap: 19 — ABNORMAL HIGH (ref 5–15)
BUN: 19 mg/dL (ref 6–23)
CO2: 24 mEq/L (ref 19–32)
CREATININE: 0.6 mg/dL (ref 0.50–1.10)
Calcium: 9 mg/dL (ref 8.4–10.5)
Chloride: 106 mEq/L (ref 96–112)
GFR calc Af Amer: >90 mL/min (ref >90)
GFR calc non Af Amer: 83 mL/min — ABNORMAL LOW (ref >90)
Glucose, Bld: 247 mg/dL — ABNORMAL HIGH (ref 70–99)
Potassium: 3.6 mEq/L — ABNORMAL LOW (ref 3.7–5.3)
Sodium: 149 mEq/L — ABNORMAL HIGH (ref 137–147)

## 2013-10-30 LAB — GLUCOSE, CAPILLARY
Glucose-Capillary: 254 mg/dL — ABNORMAL HIGH (ref 70–99)
Glucose-Capillary: 281 mg/dL — ABNORMAL HIGH (ref 70–99)
Glucose-Capillary: 268 mg/dL — ABNORMAL HIGH (ref 70–99)
Glucose-Capillary: 327 mg/dL — ABNORMAL HIGH (ref 70–99)
Glucose-Capillary: 288 mg/dL — ABNORMAL HIGH (ref 70–99)

## 2013-10-30 MED ORDER — WHITE PETROLATUM GEL
Status: AC
  Administered 2013-10-30: 0.2
  Filled 2013-10-30: qty 5

## 2013-10-30 MED ORDER — INSULIN GLARGINE 100 UNIT/ML ~~LOC~~ SOLN
5.0000 [IU] | Freq: Every day | SUBCUTANEOUS | Status: DC
  Administered 2013-10-30: 5 [IU] via SUBCUTANEOUS
  Filled 2013-10-30 (×2): qty 0.05

## 2013-10-30 NOTE — Progress Notes (Addendum)
10/30/13 patient coming to floor from 2H to room 5W14 with dx of Cholelithiasis with gallbladder distention,Diet NPO has Jevity 1.2 via NG tube in rt nare,DVT Heparin/SCD,daily weights,CBG's every 4 hours,Telemetry,I &O's,Foley catheter,Paliative consult,IV sites 10/8 LFA NSL, 10/08 Lt Hand with 1/2 NS with K+ 40meq at 10cc/hr. Unasyn IV ABT. Patient arrived on floor at 1600.

## 2013-10-30 NOTE — Progress Notes (Signed)
STROKE TEAM PROGRESS NOTE   HISTORY 80yo with hx of gallstones with prior admissions for cholecystits/cholelithiasis, HTN, HLD, CAD s/p CABG, DM, Ischemic Cardiomyopathy(EF 25-30%), hypothyroidism, CKD, statin intolerance, AFib noted 06/2012 on amiodarone who presents to with intractable n/v and abd pain. LFT's were noted to be elevated and pt was found to have multiple gallstones in a distended gallbladder with a dilated CBD. The patient has known CAD and carotid disease and was found to have a slight bump in trop to 0.4 on admit. She was seen by Cardiology who recommended an outpatient stress test. She was also considered to be at mod to high risk for adverse cardiac perioperative complications. As such, GI has recommended a transfer to [facility] to continue her care. Admitted 10/27/2013. Course has been c/b EColi UTI as well as EColi bacteremia. She was seen by GI and ultimately underwent ERCP 10/8. She tol the procedure well but had some mild hypotension and required 50% FiO2 and was left intubated post procedure and PCCM consulted for ICU management.   SUBJECTIVE (INTERVAL HISTORY) Per notes 10/9 patient was doing well post procedure, abdomen was feeling better and extubated on 10/9.  In the afternoon,  nurse noted that patient was very lethargic, not following commands. Received narcan without improvement and therefore a brain MRI was requested which showed findings consistent with an acute infarct involving the left PCA territory, primarily affecting the mesial left temporal lobe, but also involving the left thalamus and left middle temporal gyrus. MRA brain demonstrates left PCA occlusion in the P1 segment. A-fib with RVR noted.    OBJECTIVE Temp:  [98.1 F (36.7 C)-99.9 F (37.7 C)] 99.9 F (37.7 C) (10/11 1600) Pulse Rate:  [91] 91 (10/11 1600) Cardiac Rhythm:  [-] Normal sinus rhythm;Bundle branch block (10/11 0800) Resp:  [20-34] 29 (10/11 1600) BP: (151-193)/(44-84) 151/84 mmHg (10/11  1600) SpO2:  [93 %-99 %] 99 % (10/11 1600) Weight:  [195 lb 15.8 oz (88.9 kg)-216 lb (97.977 kg)] 216 lb (97.977 kg) (10/11 1600)   Recent Labs Lab 10/29/13 2352 10/30/13 0451 10/30/13 0832 10/30/13 1215 10/30/13 1607  GLUCAP 182* 254* 281* 268* 327*    Recent Labs Lab 10/24/13 1430  10/22/2013 0616 10/26/2013 1700 10/28/13 0350 10/29/13 0010 10/30/13 0305  NA  --   < > 142 144 145 147 149*  K  --   < > 3.4* 3.5* 3.7 3.7 3.6*  CL  --   < > 104 106 105 106 106  CO2  --   < > 27 26 23 25 24   GLUCOSE  --   < > 115* 128* 75 146* 247*  BUN  --   < > 14 17 17 20 19   CREATININE  --   < > 0.79 0.78 0.79 0.72 0.60  CALCIUM  --   < > 8.7 8.4 8.7 8.6 9.0  MG 2.0  --   --   --   --   --   --   < > = values in this interval not displayed.  Recent Labs Lab 10/24/13 1430 10/25/13 0619 11/17/2013 0616 10/28/13 0350 10/29/13 0010  AST 186* 46* 21 150* 57*  ALT 211* 114* 51* 77* 73*  ALKPHOS 263* 210* 264* 498* 543*  BILITOT 4.7* 1.8* 1.6* 4.7* 1.7*  PROT 7.2 7.1 6.3 6.2 6.9  ALBUMIN 3.0* 2.7* 2.1* 2.1* 2.2*    Recent Labs Lab 10/27/2013 1733 10/25/13 0619 10/28/2013 0616 10/28/13 0350 10/29/13 0010  WBC 12.6* 12.6* 10.9*  8.8 9.7  NEUTROABS 10.1* 9.4* 6.1  --  7.8*  HGB 13.7 12.7 11.6* 11.4* 12.2  HCT 40.9 39.2 36.2 36.3 38.1  MCV 91.7 93.6 93.3 95.0 94.5  PLT 213 202 212 258 302    Recent Labs Lab 10/24/13 0230  10/24/13 1223 11/06/2013 0616 11/09/2013 1700 10/20/2013 2040 10/28/13 0250  CKTOTAL 121  --   --   --   --   --   --   CKMB 3.4  --   --   --   --   --   --   TROPONINI <0.30  < > 0.31* <0.30 <0.30 0.32* 0.35*  < > = values in this interval not displayed. No results found for this basename: LABPROT, INR,  in the last 72 hours No results found for this basename: COLORURINE, APPERANCEUR, LABSPEC, PHURINE, GLUCOSEU, HGBUR, BILIRUBINUR, KETONESUR, PROTEINUR, UROBILINOGEN, NITRITE, LEUKOCYTESUR,  in the last 72 hours     Component Value Date/Time   CHOL 214*  10/29/2013 0010   TRIG 229* 10/29/2013 0010   HDL 13* 10/29/2013 0010   CHOLHDL 16.5 10/29/2013 0010   VLDL 46* 10/29/2013 0010   LDLCALC 155* 10/29/2013 0010   Lab Results  Component Value Date   HGBA1C 8.7* 10/28/2013   No results found for this basename: labopia,  cocainscrnur,  labbenz,  amphetmu,  thcu,  labbarb    No results found for this basename: ETH,  in the last 168 hours  Ct Head Wo Contrast  10/28/2013   CLINICAL DATA:  IMPRESSION: Low density in the medial left temporal lobe with loss of gray-white differentiation. This can be seen in acute to subacute infarction. Further evaluation with MRI of brain is recommended.     Mr Brain Ilda BassetWo Contrast/Mr Mra Head Wo Contrast  10/28/2013   CLINICAL DATA:   IMPRESSION: 1. Constellation of findings most compatible with acute left PCA territory infarct, primarily affecting the mesial left temporal lobe, but also involving the left thalamus and left middle temporal gyrus. MRA demonstrates left PCA occlusion in the P1 segment. 2. No associated hemorrhage. No significant mass effect at this time. Study discussed by telephone with Dr. Max FickleUGLAS MCQUAID on 10/28/2013 at 22:10 .   Electronically Signed   By: Augusto GambleLee  Hall M.D.   On: 10/28/2013 22:11   2DEcho: Impressions:  - Limited study. LVEF 30-35% with diffuse hypokinesis and severe mid to basal inferolateral hypokinesis. Diastolic function not assessed. PA diastolic and CVP estimates suggest elevated left atrial pressure. Moderate mitral regurgitation. Mild aortic regurgitation. Visualized portion of left atrial appendage shows no obvious large thrombus. No obvious PFO or ASD. Moderately reduced RV contraction.   Dg Chest Port 1 View  10/29/2013   CLINICAL DATA:  Subsequent encounter for acute respiratory failure.  EXAM: PORTABLE CHEST - 1 VIEW  COMPARISON:  One-view chest 11/19/2013.  FINDINGS: The patient has been extubated. The heart is enlarged. Mild edema is improved. Atherosclerotic  changes are again noted at the aortic. No focal airspace consolidation is evident.  IMPRESSION: 1. Cardiomegaly with improving interstitial edema. 2. Atherosclerosis.   Electronically Signed   By: Gennette Pachris  Mattern M.D.   On: 10/29/2013 07:32   Dg Chest Portable 1 View  11/09/2013   CLINICAL DATA:  Respiratory failure, hypertension, diabetes, coronary artery disease, ascending aortic aneurysm  EXAM: PORTABLE CHEST - 1 VIEW  COMPARISON:  11/19/2013  FINDINGS: Endotracheal tube with the tip 6 cm above the carina. Bilateral interstitial and alveolar airspace opacities. Bilateral layering pleural effusions.  No pneumothorax. Enlarged central pulmonary vasculature. Stable cardiomegaly. Prior CABG.  IMPRESSION: 1. Overall findings most concerning for pulmonary edema. 2. Endotracheal tube with the tip 6 cm above the carina.   Electronically Signed   By: Elige Ko   On: 10/26/2013 15:49   Dg Ercp Biliary & Pancreatic Ducts  10/23/2013   CLINICAL DATA:  Bile duct obstruction, and cholecystitis and choledocholithiasis  EXAM: ERCP  TECHNIQUE: Multiple spot images obtained with the fluoroscopic device and submitted for interpretation post-procedure.  COMPARISON:  Ultrasound 10/24/2013 and earlier studies  FINDINGS: A series of 5 fluoroscopic spot images document endoscopic cannulation and opacification of the common bile duct. CBD is mildly distended. Variable filling defects are identified in the mid and distal CBD. Incomplete opacification of intrahepatic bile ducts, which appear decompressed centrally. The final image demonstrates decompression of the common duct with no definite residual filling defects.  IMPRESSION: 1. Choledocholithiasis with endoscopic decompression.  These images were submitted for radiologic interpretation only. Please see the procedural report for the amount of contrast and the fluoroscopy time utilized.   Electronically Signed   By: Oley Balm M.D.   On: 11/15/2013 14:06   Dg Abd Portable  1v  10/25/2013   CLINICAL DATA:  Orogastric tube placement.  EXAM: PORTABLE ABDOMEN - 1 VIEW  COMPARISON:  October 24, 2013.  FINDINGS: The bowel gas pattern is normal. Distal tip of tube is seen in the expected position the distal stomach. Surgical staples are noted in the pelvis in right side of the abdomen.  IMPRESSION: Distal tip of feeding tube is seen in distal stomach. No evidence of bowel obstruction or ileus.   Electronically Signed   By: Roque Lias M.D.   On: 10/30/2013 17:44     PHYSICAL EXAM  PHYSICAL EXAM Physical exam: Exam: Gen: NAD Eyes: anicteric sclerae, moist conjunctivae                    CV: RRR, no MRG Mental Status: Alert, Not following commands  Neuro: Detailed Neurologic Exam  Speech:    Global aphasia  Cranial Nerves:    The pupils are equal, round, and reactive to light. No gaze preference. No visual neglect. face   Motor Observation:    no involuntary movements noted.     Strength:    Left arm anti-gravity, moving toes on the left. Right hemiparesis.       Cortical Sensory Modalities:     right visual neglect    ASSESSMENT/PLAN 80yo with hx of gallstones with prior admissions for cholecystits/cholelithiasis, HTN, HLD, CAD s/p CABG, DM, Ischemic Cardiomyopathy(EF 25-30%), hypothyroidism, CKD, statin intolerance, AFib noted 06/2012 on amiodarone who was admitted for intractable n/v and abd pain.  Admitted 11/18/2013. Course has been c/b EColi UTI as well as EColi bacteremia. She was seen by GI and ultimately underwent ERCP 10/8. Per notes 10/9 patient was doing well post procedure, abdomen was feeling better and extubated.  In the afternoon,  nurse noted that patient was very lethargic, not following commands. Received narcan without improvement and therefore a brain MRI was requested which showed cute left PCA territory infarct, primarily affecting the mesial left temporal lobe, but also involving the left thalamus and left middle temporal gyrus. MRA  demonstrates left PCA occlusion in the P1 segment. 2. No associated hemorrhage. No significant mass effect at this time. [name]  A-fib with RVR noted during current admission.    Carotid Doppler; Prelim: Preliminary findings: Right = 80-99% ICA stenosis.  Left = 1-39% ICA stenosis(not new, since 2014)  2D Echo: Completed: LVEF 30-35% with diffuse hypokinesis and severe mid to basal inferolateral hypokinesis.   LDL 155: Notes state statin intolerance, need history of side effects. If true intolerability, can start non-statin management for HLD goal < 70  HgbA1c 9.0, uncontrolled  Ongoing aggressive risk factor management  Permissive hypertension <220/120 for 24-48 hours and then gradually normalize within 5-7 days  BP goal long term normotensive  Will need to start anti-coagulation for Afib, Eliquis in one week   I have personally examined this patient, reviewed notes, independently viewed imaging studies, participated in medical decision making and plan of care. I have made any additions or clarifications directly to the above note. Agree with note above.  Artemio Alyoni Shaneice Barsanti, MD  [facility]  Pager: [phone] 10/28/2013 5:16 PM      To contact Stroke Continuity provider, please refer to [email or web address]. After hours, contact General Neurology

## 2013-10-30 NOTE — Progress Notes (Signed)
PULMONARY / CRITICAL CARE MEDICINE   Name: Cathy [name] MRN: [id number] DOB: 1930-04-01    ADMISSION DATE:  11/02/2013 CONSULTATION DATE:  10/30/2013  REFERRING MD :  Lendell CapriceSullivan   CHIEF COMPLAINT:  Vent management   INITIAL PRESENTATION: 80 yo female with complex PMH including HTN, CAD, CKD (ischemic cardiomyopathy (EF25-30%) PAF and gallstones with mult prior admissions for cholecystits/cholelithiasis admitted 10/4 with intractable n/v and abd pain. Admitted with mult gallstones with dilated CBD as well as NSTEMI and acute on chronic renal failure. Course has been c/b EColi UTI as well as EColi bacteremia.  She was seen by GI and ultimately underwent ERCP 10/8.  She tol the procedure well but had some mild hypotension and required 50% FiO2 and was left intubated post procedure and PCCM consulted for ICU management.    STUDIES:  abd u/s 10/5>>> Cholelithiasis with gallbladder distention, Echogenic foci within a dilated common bile duct most concerning for choledocholithiasis. The common bile duct measures 15.5 mm in diameter. 2D echo 10/5>>> EF 30-35%, mod LVH, severely reduced systolic fx, global hypokinesis, grade 2 diastolic dysfunc, mod MR, PA press 51mmHg CT 10/9 - Low density in the medial left temporal lobe with loss of gray-white  differentiation. This can be seen in acute to subacute infarction  MRI brain 10/9 - Constellation of findings most compatible with acute left PCA  territory infarct, primarily affecting the mesial left temporal  lobe, but also involving the left thalamus and left middle temporal  gyrus. MRA demonstrates left PCA occlusion in the P1 segment.  SIGNIFICANT EVENTS: 10/8 ERCP>>>normal ampulla was signs of previous sphincterotomy status post increased sphincterotomy size 2 CBD duct  filled with stones sludge and debris removed as above 3 negative occlusion cholangiogram at the end of the procedure with multiple negative balloon pull-throughs and adequate biliary  drainage  10/9 -remained intubated 10/9 extubated, sudden chang eiN MS, stroke Dx, fib, rvr 10/10 DNR established  SUBJECTIVE: no changes in int neurostatus  VITAL SIGNS: Temp:  [97.9 F (36.6 C)-98.6 F (37 C)] 98.6 F (37 C) (10/11 0800) Resp:  [18-33] 26 (10/11 0800) BP: (151-193)/(44-76) 171/50 mmHg (10/11 0800) SpO2:  [93 %-96 %] 96 % (10/11 0800) Weight:  [88.9 kg (195 lb 15.8 oz)] 88.9 kg (195 lb 15.8 oz) (10/11 0500) HEMODYNAMICS:   VENTILATOR SETTINGS:   INTAKE / OUTPUT:  Intake/Output Summary (Last 24 hours) at 10/30/13 1000 Last data filed at 10/30/13 0800  Gross per 24 hour  Intake  807.5 ml  Output   2000 ml  Net -1192.5 ml    PHYSICAL EXAMINATION: General:  calm Neuro:  Squeezes left, wingles toes, nodding, will localize, perr, some purpose left HEENT:  jvd wnl Cardiovascular:  s1s2  rrr remains in SR Lungs:  CTA Abdomen:  Round, mildly distended, hypoactive bs, nontender Musculoskeletal:  Warm and dry, 1+ BLE edema    LABS:  CBC  Recent Labs Lab 10/29/2013 0616 10/28/13 0350 10/29/13 0010  WBC 10.9* 8.8 9.7  HGB 11.6* 11.4* 12.2  HCT 36.2 36.3 38.1  PLT 212 258 302   Coag's  Recent Labs Lab 11/08/2013 0616  INR 1.14   BMET  Recent Labs Lab 10/28/13 0350 10/29/13 0010 10/30/13 0305  NA 145 147 149*  K 3.7 3.7 3.6*  CL 105 106 106  CO2 23 25 24   BUN 17 20 19   CREATININE 0.79 0.72 0.60  GLUCOSE 75 146* 247*   Electrolytes  Recent Labs Lab 10/24/13 1430  10/28/13 0350 10/29/13  0010 10/30/13 0305  CALCIUM  --   < > 8.7 8.6 9.0  MG 2.0  --   --   --   --   < > = values in this interval not displayed. Sepsis Markers  Recent Labs Lab 11/04/2013 1922 10/26/2013 1700  LATICACIDVEN 3.7* 1.3   ABG  Recent Labs Lab 10/28/13 1552 10/28/13 1720  PHART 7.331* 7.351  PCO2ART 51.9* 45.4*  PO2ART 76.0* 72.0*   Liver Enzymes  Recent Labs Lab 10/24/2013 0616 10/28/13 0350 10/29/13 0010  AST 21 150* 57*  ALT 51* 77* 73*   ALKPHOS 264* 498* 543*  BILITOT 1.6* 4.7* 1.7*  ALBUMIN 2.1* 2.1* 2.2*   Cardiac Enzymes  Recent Labs Lab 10/24/2013 1757  10/29/2013 1700 11/16/2013 2040 10/28/13 0250  TROPONINI 0.41*  < > <0.30 0.32* 0.35*  PROBNP 2918.0*  --   --   --  4804.0*  < > = values in this interval not displayed. Glucose  Recent Labs Lab 10/29/13 1206 10/29/13 1606 10/29/13 1920 10/29/13 2352 10/30/13 0451 10/30/13 0832  GLUCAP 176* 157* 131* 182* 254* 281*    Imaging Dg Chest Port 1 View  10/29/2013   CLINICAL DATA:  Subsequent encounter for acute respiratory failure.  EXAM: PORTABLE CHEST - 1 VIEW  COMPARISON:  One-view chest 10/22/2013.  FINDINGS: The patient has been extubated. The heart is enlarged. Mild edema is improved. Atherosclerotic changes are again noted at the aortic. No focal airspace consolidation is evident.  IMPRESSION: 1. Cardiomegaly with improving interstitial edema. 2. Atherosclerosis.   Electronically Signed   By: Gennette Pachris  Mattern M.D.   On: 10/29/2013 07:32   Dg Abd Portable 1v  10/29/2013   CLINICAL DATA:  NG tube placement  EXAM: PORTABLE ABDOMEN - 1 VIEW  COMPARISON:  10/22/2013  FINDINGS: There is nonspecific nonobstructive bowel gas pattern. NG tube is noted with tip in mid stomach. Surgical clips are noted within pelvis.  IMPRESSION: NG tube with tip in mid stomach.   Electronically Signed   By: Natasha MeadLiviu  Pop M.D.   On: 10/29/2013 18:08     ASSESSMENT / PLAN:  PULMONARY OETT 10/8>>>10/9 Acute respiratory failure - multifactorial post procedure with underlying bacteremia, abd distension and AFib with RVR.   P:   Protecting airway DNI Comfort if fails  CARDIOVASCULAR ?NSTEMI - peak trop 0.41 H/O PAF Development of AFRVR periopratively 10/08, reoccurence 10/9 Hypotension  Hx chronic combined CHF Ischemic cardiomyopathy - EF 30-35% Chronic beta blocker therapy P:  Would have permissive HTN, would tolerate sys 200-220, in next 48 hr would reconsider reductions in  BP Would avoid map less 75 Hold anticoagulation further until d/w neuro on safety hemorrhagic conversion risk Low dose PRN metoprolol Consider dc tele given family wishes  RENAL Mild hypernatremia in setting cva P:   kvo Allow na , would tolerate NA 154 Chem in am   GASTROINTESTINAL Choledocholithiasis without cholangitis - s/p ERCP 10/8 Dysphagia likley with cva P:   TF through NGT ppi  HEMATOLOGIC No active issue  P:  scd sub q hep  INFECTIOUS EColi UTI (neg UA, low K ) >? real EColi bacteremia likely secondary to cholangitis  P:   BCx2  10/4>>> 1/2 EColi>>> Res cipro UC 10/4>>> EColi>>> Res cipro, levaquin  Unasyn 10/5>>>plan 10/13 Add stop 13th  ENDOCRINE IDDM   Hypothyroid Glu wnl P:   Change SSI to q4 As TF on, ad lantus Cont synthroid   NEUROLOGIC Acute CVA, presumed embolic P:   Asa, place  NGT Stroke workup per neuro Echo for clot - pending read Will need PT / OT Permissive HTN Allow na to rise   Family updated: updated med poa 10/11  Global: CVA, abx stop date, pall care to triad, floor  I have personally obtained a history, examined the patient, evaluated laboratory and imaging results, formulated the assessment and plan and placed orders.    Mcarthur Rossetti. Tyson Alias, MD, FACP Pgr: [phone] [facility]

## 2013-10-30 NOTE — Progress Notes (Signed)
RN went to check on patient and found that her NGT was partially removed. Feedings stopped. On-call provider notified and gave orders to advance NGT, get abd XR to confirm placement and start feeds back once placement is verified. Patient remains tachypnic, breath sounds present in all lobes, oxygen sats 95% on 4L [place]. NGT advanced, xray completed to verify placement. Awaiting results. Will continue to monitor.

## 2013-10-31 LAB — GLUCOSE, CAPILLARY: GLUCOSE-CAPILLARY: 279 mg/dL — AB (ref 70–99)

## 2013-11-01 NOTE — Anesthesia Postprocedure Evaluation (Signed)
  Anesthesia Post-op Note  Patient: Vella KohlerFreda H Connaughton  Procedure(s) Performed: Procedure(s): ENDOSCOPIC RETROGRADE CHOLANGIOPANCREATOGRAPHY (ERCP) WITH PROPOFOL  Patient Location: ICU  Anesthesia Type:General  Level of Consciousness: sedated  Airway and Oxygen Therapy: Patient remains intubated per anesthesia plan  Post-op Pain: none  Post-op Assessment: Post-op Vital signs reviewed, Patient's Cardiovascular Status Stable and RESPIRATORY FUNCTION UNSTABLE  Post-op Vital Signs: Reviewed and stable  Last Vitals:  Filed Vitals:   10/30/13 2126  BP: 183/70  Pulse: 110  Temp: 37.2 C  Resp: 40    Complications: Pt has poor respiratory efforts at the end of procedure.  With patient's complicated history, will treat conservatively by moving to ICU while remaining intubated and vent supported.  Will consult critical care.

## 2013-11-02 NOTE — Consult Note (Signed)
Seen and agree  

## 2013-11-16 ENCOUNTER — Ambulatory Visit: Payer: Medicare HMO | Admitting: Family Medicine

## 2013-11-17 NOTE — Discharge Summary (Addendum)
NAMEGIULIA, [name] NO.:  [id number]  MEDICAL RECORD NO.:  [id number]  LOCATION:  5W14C                        FACILITY:  [facility]  PHYSICIAN:  Raylene Miyamoto, MD DATE OF BIRTH:  [birth year]  DATE OF ADMISSION:  11/15/2013 DATE OF DISCHARGE:  10-Nov-2013                              DISCHARGE SUMMARY   DEATH SUMMARY  This is an 80 year old female with complex past medical history including hypertension, coronary artery disease, CKD with ischemic cardiomyopathy, ejection fraction noted to be 25% to 30% with paroxysmal atrial fibrillation and gallstones with multiple prior admissions with cholecystitis and cholelithiasis admitted on October 4th with intractable nausea, vomiting, abdominal pain, treatment of multiple gallstones with dilated common bile duct as well as a non-STEMI and acute on chronic renal failure.  Her course was complicated by E. coli UTI as well as E. coli bacteremia.  GI consultation resulted in ERCP on October 8th.  She tolerated procedure well at that time, but had mild hypotension, requiring 50% FiO2 and was left intubated postprocedure. Eventually she was extubated successfully and noted then at that time to have neurologic changes.  Imaging on October 9th on CT of the head showed a low density in the medial left temporal lobe with loss of gray white differentiation, this could be seen in subacute infarct.  MRI of the brain done on October 9th as well as Neurology consultation showed constellation of findings most compatible with an acute left PCA territory infarct primarily affecting the mesial left temporal lobe, also involving other areas such as the left thalamus and left middle temporal gyrus.  I met with the medical power-of-attorney, son who was crystal clear that she would not want to be continued under these circumstances and comfort care if discussed were to be noted would be what she would wish under these circumstances now  with bacteremia, multiorgan failure, new devastating stroke.  The patient was successfully extubated.  She was made DNR/DNI, was transitioned more to a comfort care approach, and the patient expired.  The patient was pronounced dead on Nov 10, 2013.  FINAL DIAGNOSES UPON DEATH: 1. Escherichia coli bacteremia with organ dysfunction 2. Cholangitis likely the source related to #1. 3. Acute cerebrovascular accident. 4. Known history of cardiomyopathy. 5. Paroxysmal atrial fibrillation. 6. Hypertension. 7. Chronic kidney disease.    Raylene Miyamoto, MD    DJF/MEDQ  D:  11/17/2013  T:  11/17/2013  Job:  [id number]

## 2013-11-20 NOTE — Progress Notes (Signed)
Patient transported to morgue.  Bed placement made aware.

## 2013-11-20 NOTE — Progress Notes (Signed)
This RN, as well as, K.Faucette, RN auscultated patient, and were unable to hear breath or heart sounds. No pulses felt on palpation at radial and pedal sites by either RN.  The patient defecated after arrest as well.  MD made aware and family notified.  All lines and drains removed; patient was cleaned. [facility] notified, patient is not a candidate. Bed placement notified. Patient taken to morgue.

## 2013-11-20 NOTE — Progress Notes (Signed)
Called by primary RN to see pt due to c/o tachypnea & restlessness.  On arrival to the room the pt was alert & able to deny having any pain.  Repositioned pt in the bed.  Placed pt on portable monitor to check VS & she went into VT and arrested.  Primary RN on the phone with the MD when death occurred.

## 2013-11-20 NOTE — Progress Notes (Signed)
On-call provider notified of abd xray showing pulmonary edema that was not present on previous xray. No new orders at this time. Will continue to monitor.

## 2013-11-20 DEATH — deceased

## 2013-12-29 ENCOUNTER — Encounter (HOSPITAL_COMMUNITY): Payer: Self-pay | Admitting: Cardiovascular Disease

## 2014-06-12 IMAGING — CT CT NECK W/O CM
4 series · 15 of 33 positions shown, 18 images · non-contrast
Comparison: MRI brain 03/29/2012.  CT head without contrast
09/20/2011.

CLINICAL DATA: Swelling and redness of the left neck below the ear.

CT NECK WITHOUT CONTRAST
TECHNIQUE: Multidetector CT imaging of the neck was performed
without intravenous contrast.

[Series 2: soft tissue neck 2.0 b31s · axial · 0.43mm/px · z∈[+70,+214]mm · 5 of 108 slices shown, 7 images]
[im 18/108  soft-tissue]
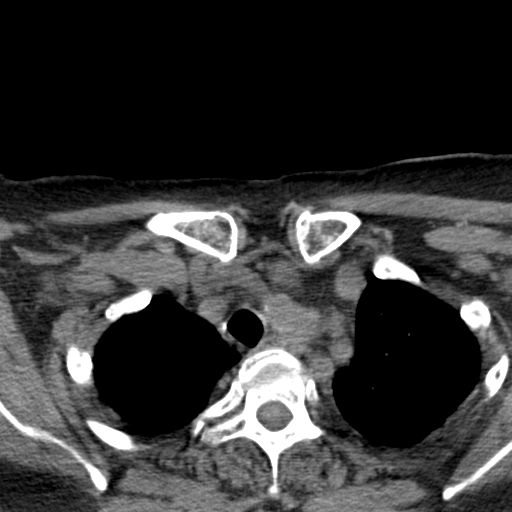
[im 18/108  bone]
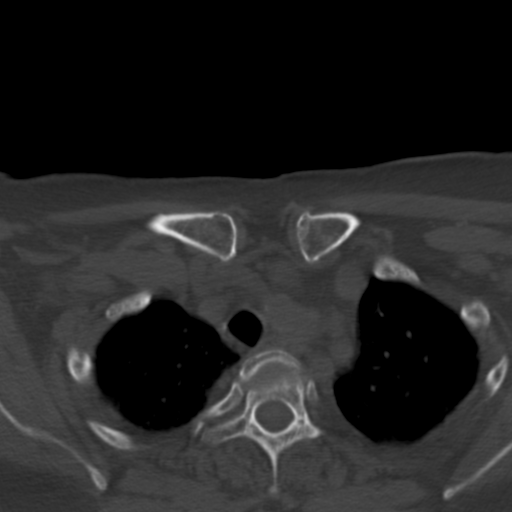
[im 36/108  bone]
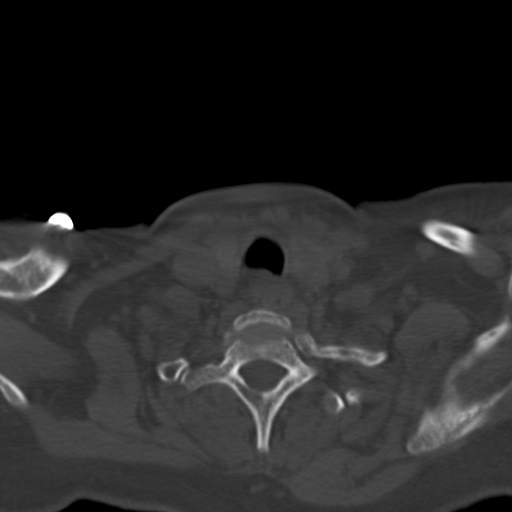
[im 54/108  bone]
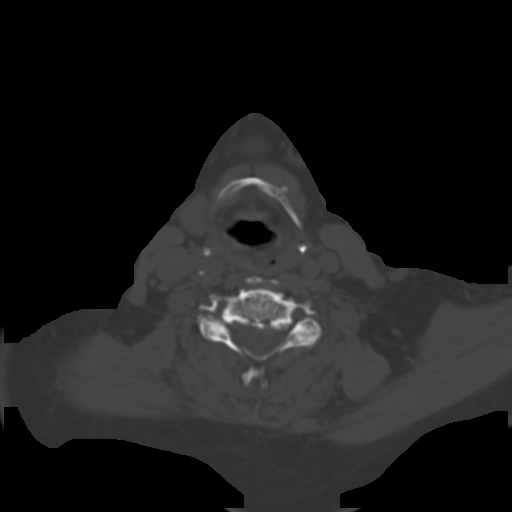
[im 72/108  bone]
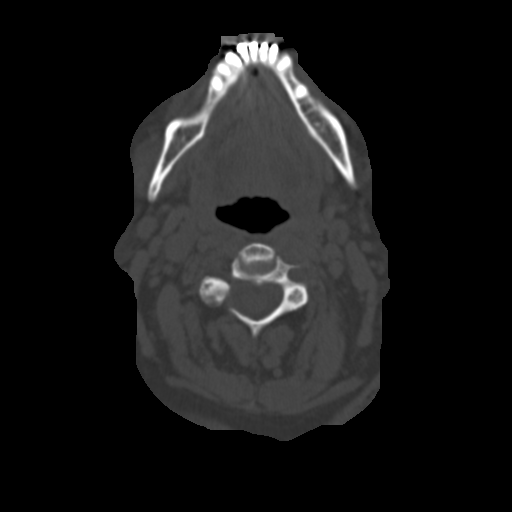
[im 90/108  soft-tissue]
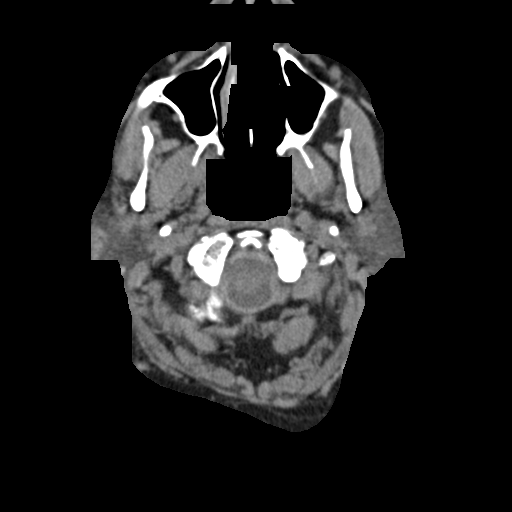
[im 90/108  bone]
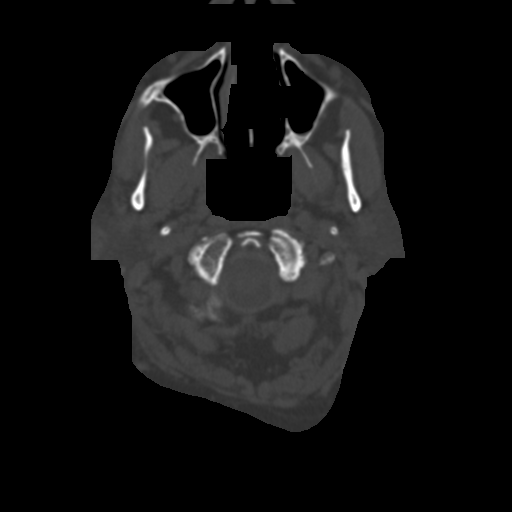

[Series 4: neck 2.0 soft tissue sag · sagittal · 0.43mm/px · 5 of 90 slices shown, 6 images]
[im 30/90  bone]
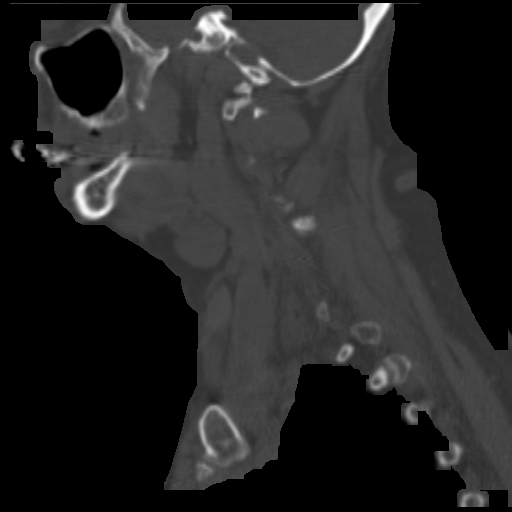
[im 38/90  bone]
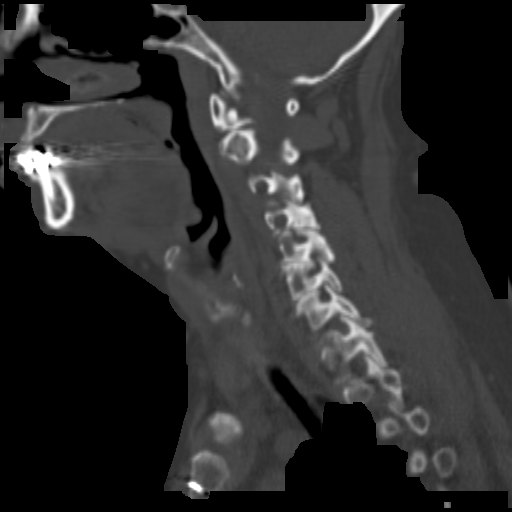
[im 45/90  soft-tissue]
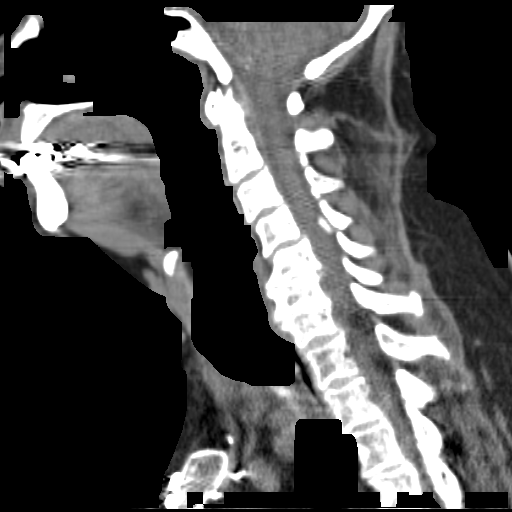
[im 45/90  bone]
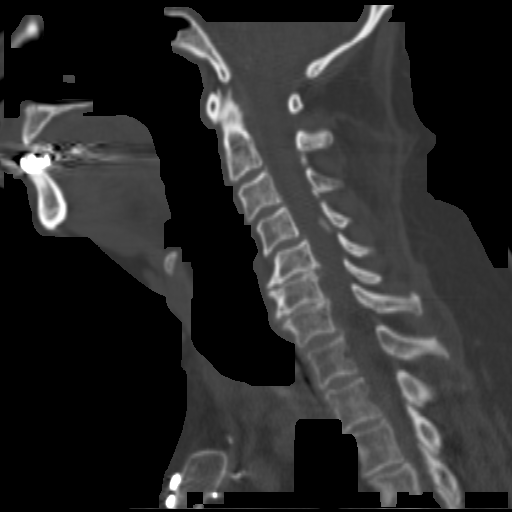
[im 52/90  bone]
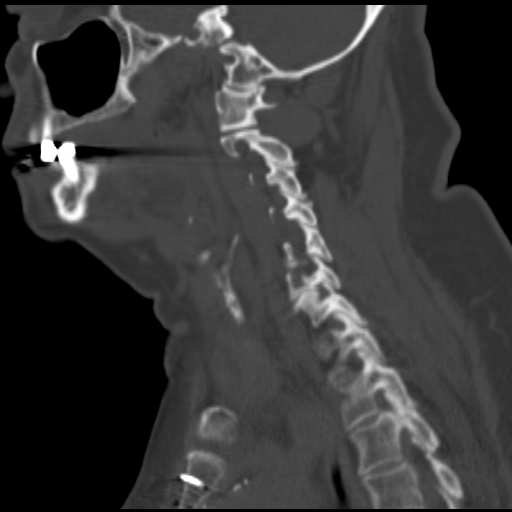
[im 60/90  bone]
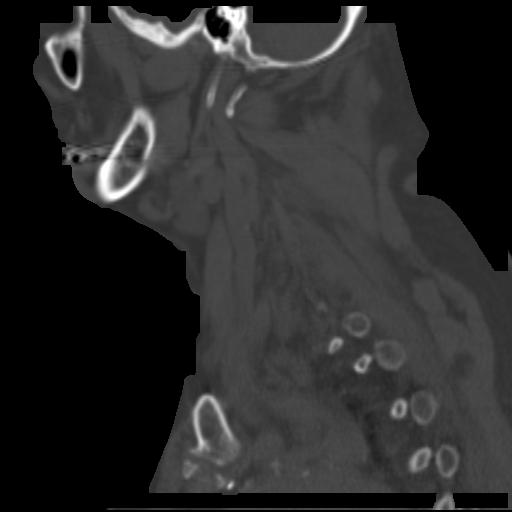

[Series 5: neck 2.0 soft tissue coro · coronal · 0.42mm/px · 3 of 116 slices shown]
[im 24/116  bone]
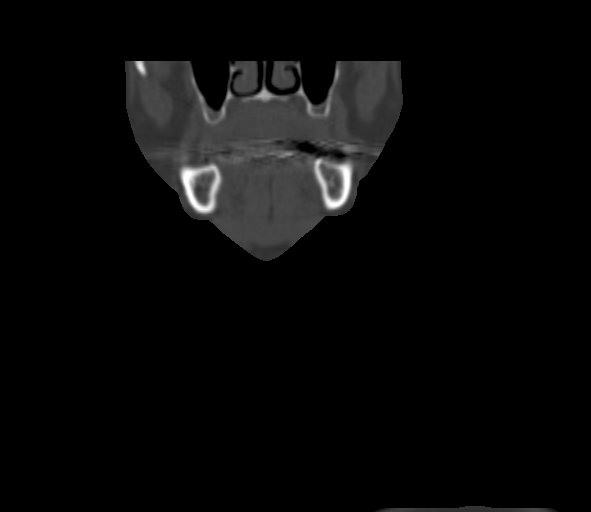
[im 47/116  bone]
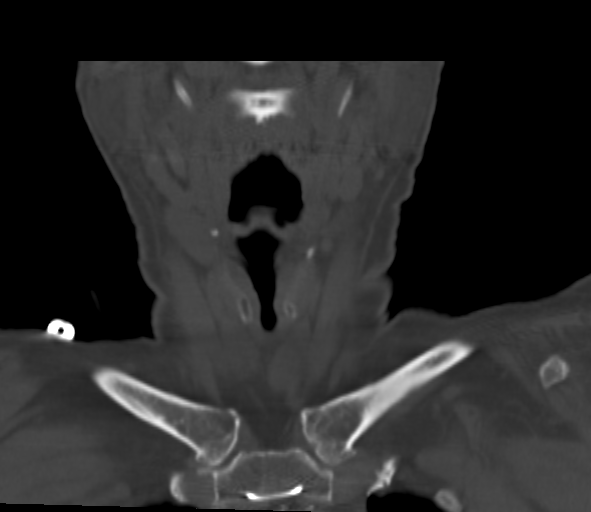
[im 70/116  bone]
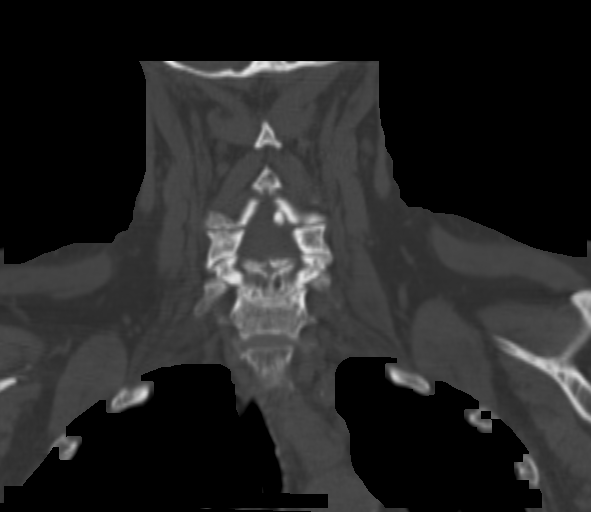

[Series 6: axial soft tissue neck 2.0 · axial · 0.46mm/px · z∈[+67,+103]mm · 2 of 108 slices shown]
[im 18/108  soft-tissue]
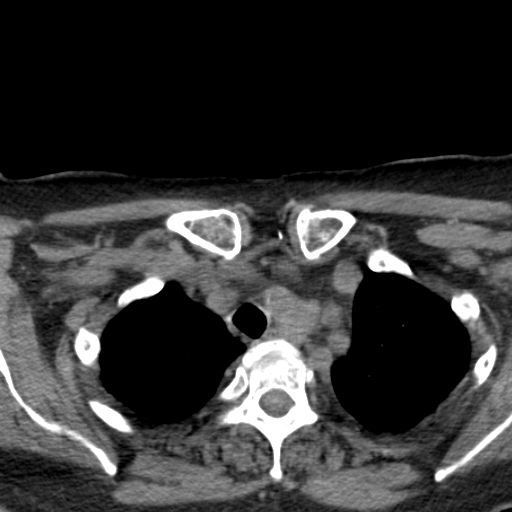
[im 36/108  soft-tissue]
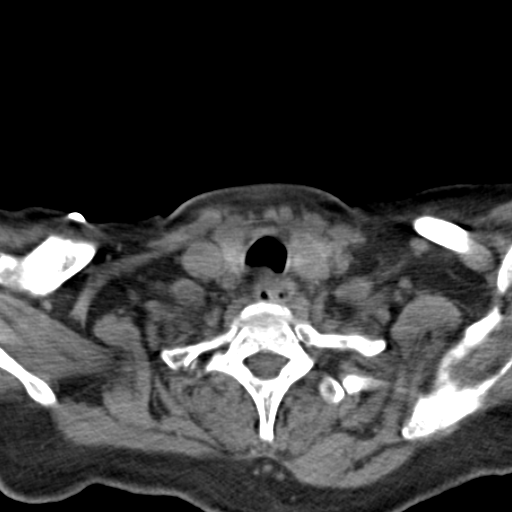

[15 of 33 positions shown; findings below may reference images not displayed]

FINDINGS: No focal subcutaneous lesion is evident.  The parotid
glands are within normal limits bilaterally.  The left
submandibular gland is smaller than the right.  No focal lesions or
duct dilation is evident.

Bilateral level I level II sub centimeter lymph nodes are present.
No pathologically enlarged nodes are present.  No focal mucosal or
submucosal lesions are present.  Vocal cords are midline and
symmetric.

The thyroid is heterogeneous with enlargement of the left lobe
extending inferiorly.  No dominant nodule is evident.
Atherosclerotic calcifications are present at the aortic arch and
carotid bifurcations.  The patient is status post median
sternotomy.

Limited imaging the brain is unremarkable.

The bone windows demonstrate to moderate to spondylosis with
significant loss of disc height at C5-6 and C6-7.  Degenerative
anterolisthesis is present at C4-5.
IMPRESSION: 1.  No acute or focal lesion explain redness of the left neck.
This may represent a superficial cellulitis.
2.  No discrete mass lesion.
3.  Moderate spondylosis of the cervical spine as described.
4.  Heterogeneous appearance of the thyroid with enlargement of the
left lobe.  No dominant nodule is evident.
5.  Decreased size of the left submandibular gland likely
represents chronic atrophy.

## 2015-11-07 IMAGING — CR DG ABD PORTABLE 1V
1 series · 1 of 1 positions shown · non-contrast
Comparison: 07/27/2013, CT.

CLINICAL DATA: Dilated common bile duct concerning for
choledocholithiasis. Acute onset of nausea prior to admission. Right
upper quadrant pain.

EXAM:
PORTABLE ABDOMEN - 1 VIEW

[supine ap]
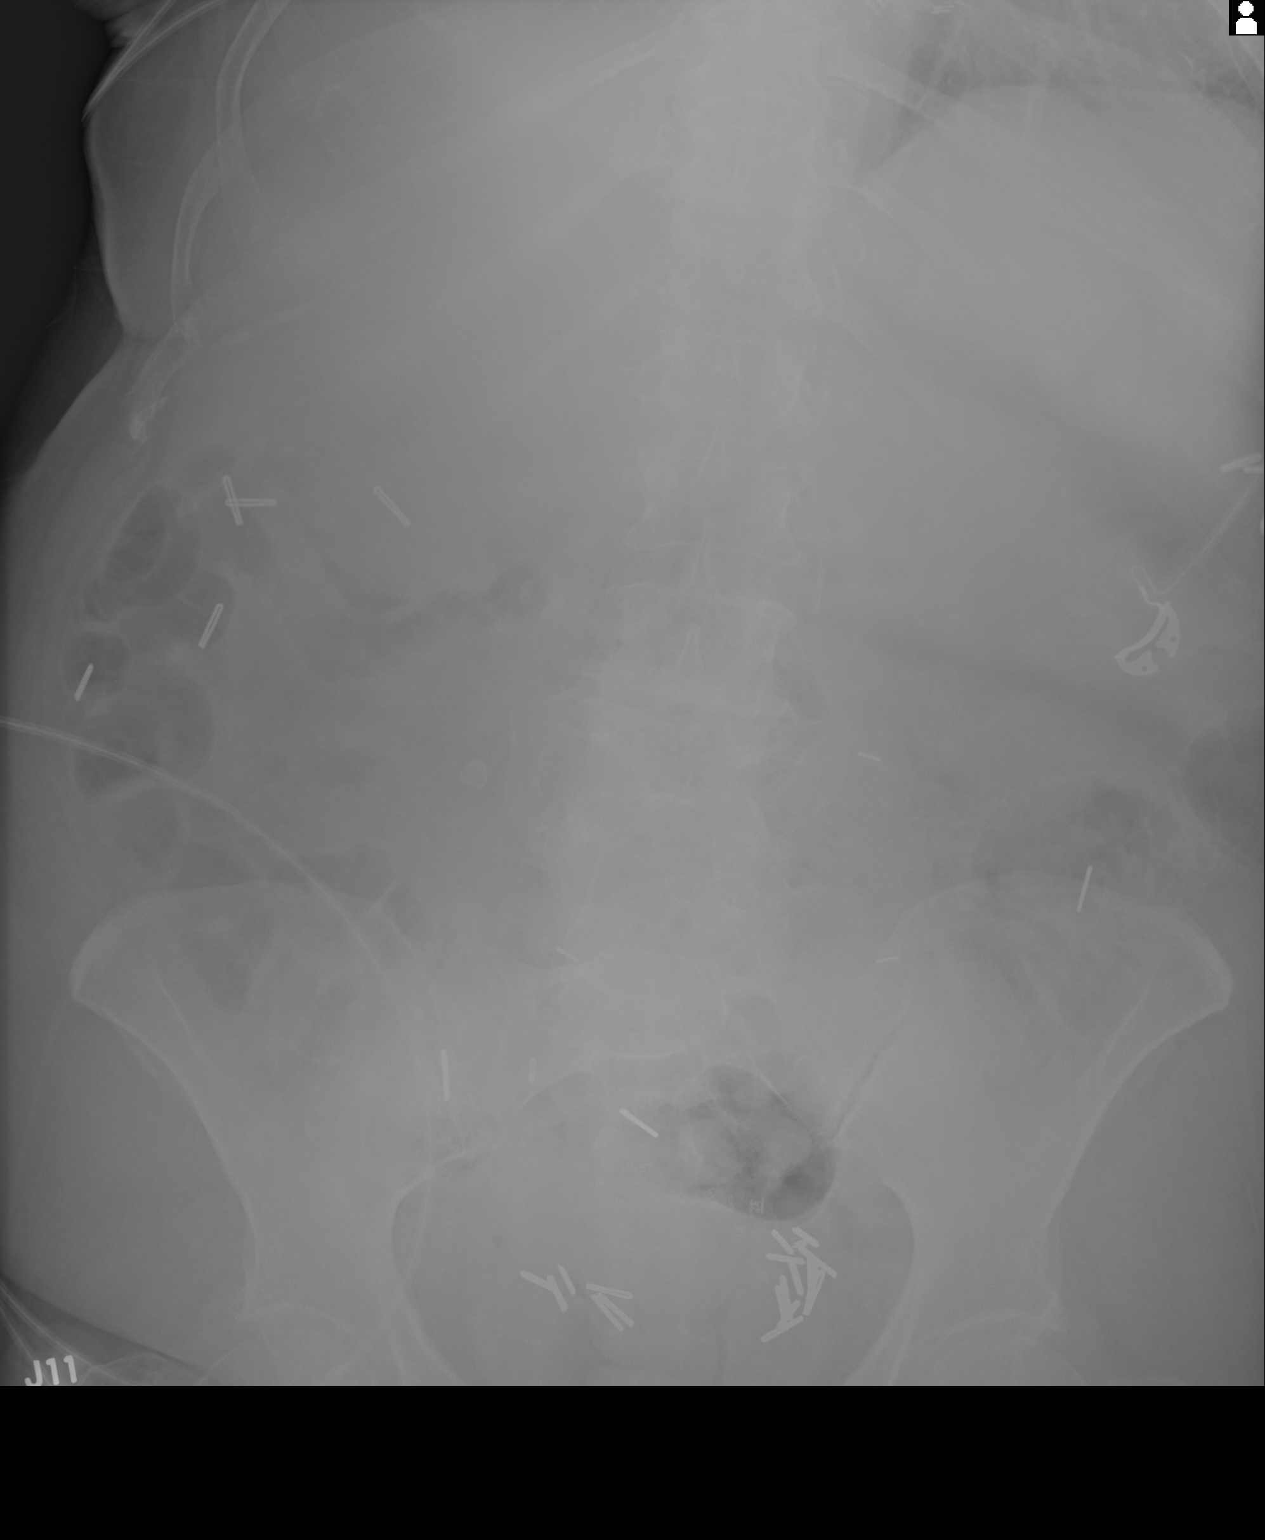

[1 of 1 positions shown; findings below may reference images not displayed]

FINDINGS: Multiple surgical clips are demonstrated throughout the abdomen.
There is relative paucity of small bowel gas, limiting evaluation.
Gas is demonstrated throughout the colon. Stool throughout the
descending colon and rectum. Supine evaluation limited for the
detection of free intraperitoneal air. Marked degenerative change of
the lower lumbar spine.
IMPRESSION: Paucity of small bowel gas. No definite evidence for small bowel
obstruction.

Stool throughout the descending colon and rectum.
